# Patient Record
Sex: Female | Born: 1955
Health system: Southern US, Community
[De-identification: ages and names within clinical notes are randomized; demographics above are authoritative.]

## PROBLEM LIST (undated history)

## (undated) DIAGNOSIS — H209 Unspecified iridocyclitis: Secondary | ICD-10-CM

## (undated) DIAGNOSIS — J939 Pneumothorax, unspecified: Secondary | ICD-10-CM

## (undated) DIAGNOSIS — H269 Unspecified cataract: Secondary | ICD-10-CM

## (undated) DIAGNOSIS — D869 Sarcoidosis, unspecified: Secondary | ICD-10-CM

## (undated) DIAGNOSIS — I10 Essential (primary) hypertension: Secondary | ICD-10-CM

## (undated) HISTORY — PX: VESICOVAGINAL FISTULA CLOSURE W/ TAH: SUR271

## (undated) HISTORY — DX: Unspecified cataract: H26.9

## (undated) HISTORY — PX: CATARACT EXTRACTION: SUR2

## (undated) HISTORY — PX: ABDOMINAL HYSTERECTOMY: SHX81

## (undated) HISTORY — DX: Unspecified iridocyclitis: H20.9

## (undated) HISTORY — DX: Sarcoidosis, unspecified: D86.9

## (undated) HISTORY — PX: APPENDECTOMY: SHX54

---

## 1999-03-24 ENCOUNTER — Emergency Department (HOSPITAL_COMMUNITY): Admission: EM | Admit: 1999-03-24 | Discharge: 1999-03-24 | Payer: Self-pay | Admitting: Emergency Medicine

## 1999-03-28 ENCOUNTER — Encounter: Payer: Self-pay | Admitting: Emergency Medicine

## 1999-03-28 ENCOUNTER — Inpatient Hospital Stay (HOSPITAL_COMMUNITY): Admission: EM | Admit: 1999-03-28 | Discharge: 1999-04-01 | Payer: Self-pay | Admitting: Emergency Medicine

## 1999-03-29 ENCOUNTER — Encounter (HOSPITAL_BASED_OUTPATIENT_CLINIC_OR_DEPARTMENT_OTHER): Payer: Self-pay | Admitting: General Surgery

## 1999-04-21 ENCOUNTER — Encounter: Payer: Self-pay | Admitting: Family Medicine

## 1999-04-21 ENCOUNTER — Ambulatory Visit (HOSPITAL_COMMUNITY): Admission: RE | Admit: 1999-04-21 | Discharge: 1999-04-21 | Payer: Self-pay | Admitting: Family Medicine

## 2001-01-24 HISTORY — PX: BRONCHOSCOPY: SUR163

## 2001-04-05 ENCOUNTER — Encounter (INDEPENDENT_AMBULATORY_CARE_PROVIDER_SITE_OTHER): Payer: Self-pay | Admitting: Specialist

## 2001-04-05 ENCOUNTER — Ambulatory Visit: Admission: RE | Admit: 2001-04-05 | Discharge: 2001-04-05 | Payer: Self-pay | Admitting: Pulmonary Disease

## 2001-10-26 ENCOUNTER — Emergency Department (HOSPITAL_COMMUNITY): Admission: EM | Admit: 2001-10-26 | Discharge: 2001-10-26 | Payer: Self-pay | Admitting: Emergency Medicine

## 2003-03-06 ENCOUNTER — Emergency Department (HOSPITAL_COMMUNITY): Admission: EM | Admit: 2003-03-06 | Discharge: 2003-03-07 | Payer: Self-pay | Admitting: Emergency Medicine

## 2006-05-24 ENCOUNTER — Emergency Department (HOSPITAL_COMMUNITY): Admission: EM | Admit: 2006-05-24 | Discharge: 2006-05-25 | Payer: Self-pay | Admitting: Emergency Medicine

## 2007-05-18 DIAGNOSIS — H209 Unspecified iridocyclitis: Secondary | ICD-10-CM | POA: Insufficient documentation

## 2007-05-18 DIAGNOSIS — D869 Sarcoidosis, unspecified: Secondary | ICD-10-CM | POA: Insufficient documentation

## 2007-05-21 ENCOUNTER — Ambulatory Visit: Payer: Self-pay | Admitting: Internal Medicine

## 2007-05-29 ENCOUNTER — Telehealth: Payer: Self-pay | Admitting: Internal Medicine

## 2007-06-19 ENCOUNTER — Ambulatory Visit: Payer: Self-pay | Admitting: Internal Medicine

## 2007-07-01 LAB — CONVERTED CEMR LAB: Angiotensin 1 Converting Enzyme: 108 U/L — ABNORMAL HIGH

## 2007-07-03 ENCOUNTER — Telehealth: Payer: Self-pay | Admitting: Internal Medicine

## 2007-10-19 ENCOUNTER — Ambulatory Visit: Payer: Self-pay | Admitting: Internal Medicine

## 2007-11-09 ENCOUNTER — Telehealth (INDEPENDENT_AMBULATORY_CARE_PROVIDER_SITE_OTHER): Payer: Self-pay | Admitting: *Deleted

## 2008-01-22 ENCOUNTER — Ambulatory Visit: Payer: Self-pay | Admitting: Internal Medicine

## 2008-03-17 ENCOUNTER — Ambulatory Visit: Payer: Self-pay | Admitting: Internal Medicine

## 2009-01-21 ENCOUNTER — Encounter: Admission: RE | Admit: 2009-01-21 | Discharge: 2009-01-21 | Payer: Self-pay | Admitting: Obstetrics and Gynecology

## 2010-01-22 ENCOUNTER — Encounter
Admission: RE | Admit: 2010-01-22 | Discharge: 2010-01-22 | Payer: Self-pay | Source: Home / Self Care | Attending: Obstetrics and Gynecology | Admitting: Obstetrics and Gynecology

## 2010-04-16 ENCOUNTER — Encounter: Payer: Self-pay | Admitting: Internal Medicine

## 2010-04-16 ENCOUNTER — Other Ambulatory Visit (INDEPENDENT_AMBULATORY_CARE_PROVIDER_SITE_OTHER): Payer: BC Managed Care – PPO

## 2010-04-16 ENCOUNTER — Ambulatory Visit (INDEPENDENT_AMBULATORY_CARE_PROVIDER_SITE_OTHER)
Admission: RE | Admit: 2010-04-16 | Discharge: 2010-04-16 | Disposition: A | Discharge status: Home / Self Care | Payer: BC Managed Care – PPO | Source: Ambulatory Visit | Attending: Internal Medicine | Admitting: Internal Medicine

## 2010-04-16 ENCOUNTER — Ambulatory Visit (INDEPENDENT_AMBULATORY_CARE_PROVIDER_SITE_OTHER): Payer: BC Managed Care – PPO | Admitting: Internal Medicine

## 2010-04-16 VITALS — BP 152/84 | HR 64 | Ht 62.0 in | Wt 153.4 lb

## 2010-04-16 DIAGNOSIS — D869 Sarcoidosis, unspecified: Secondary | ICD-10-CM

## 2010-04-16 DIAGNOSIS — H209 Unspecified iridocyclitis: Secondary | ICD-10-CM

## 2010-04-16 LAB — HEPATIC FUNCTION PANEL
AST: 46 U/L — ABNORMAL HIGH (ref 0–37)
Bilirubin, Direct: 0.1 mg/dL (ref 0.0–0.3)
Total Bilirubin: 0.2 mg/dL — ABNORMAL LOW (ref 0.3–1.2)

## 2010-04-16 LAB — BASIC METABOLIC PANEL
BUN: 10 mg/dL (ref 6–23)
CO2: 32 mEq/L (ref 19–32)
Chloride: 101 mEq/L (ref 96–112)
Glucose, Bld: 83 mg/dL (ref 70–99)
Potassium: 4.1 mEq/L (ref 3.5–5.1)

## 2010-04-16 LAB — CBC WITH DIFFERENTIAL/PLATELET
Basophils Relative: 0.5 % (ref 0.0–3.0)
Eosinophils Absolute: 0.1 10*3/uL (ref 0.0–0.7)
Eosinophils Relative: 1.7 % (ref 0.0–5.0)
Hemoglobin: 12.6 g/dL (ref 12.0–15.0)
Lymphocytes Relative: 42.1 % (ref 12.0–46.0)
Monocytes Relative: 10.9 % (ref 3.0–12.0)
Neutro Abs: 2 10*3/uL (ref 1.4–7.7)
Neutrophils Relative %: 44.8 % (ref 43.0–77.0)
RBC: 4.5 Mil/uL (ref 3.87–5.11)
WBC: 4.5 10*3/uL (ref 4.5–10.5)

## 2010-04-16 LAB — PROTIME-INR: Prothrombin Time: 11.9 s (ref 10.2–12.4)

## 2010-04-16 LAB — CALCIUM: Calcium: 9.4 mg/dL (ref 8.4–10.5)

## 2010-04-16 NOTE — Assessment & Plan Note (Addendum)
Symptoms strongly suggest reactivation of Sarcoid. She has coughed up occasional small amounts of blood when she gets up in the morning, for years, not clearly worse.  We will check ACE and liver and renal labs, with CXR., TB skin test and coags.

## 2010-04-16 NOTE — Progress Notes (Signed)
  Subjective:    Patient ID: Jill Alexanders, female    DOB: Oct 10, 1955, 55 y.o.   MRN: [id number]  HPI 75 yoAAF never smoker, here with daughter today. I have seen her for Sarcoid involving eyes/ iritis, lungs and skin. ACE 06/19/07 was 108 (9-67). She was on plaquenyl for spots around her eyes, and on MTX from her opthalmologist - now off both for over a year. She dropped off these meds after last seen by me 03/17/08. Since December she is coughing when active, with a little blood in clear mucus, and some dyspnea especially supine. Denies nasal congestion. Having Night sweats / change bedclothes, w/o definite fever. Cervical nodes may be swollen. Denies pain. Noting some increased skin nodularity in axillae. 20 lb weight  Loss w/o anorexia, change in bowel or bladder. She feels as she did when originally dx'd by bronchoscopy in 2003. She was treated originally with prednisone.    Review of Systems As per HPI Constitutional:   . HEENT:   No headaches,  Difficulty swallowing,  Tooth/dental problems,  Sore throat,                No sneezing, itching, ear ache, nasal congestion, post nasal drip,   CV:  No chest pain,  Orthopnea, PND, swelling in lower extremities, anasarca, dizziness, palpitations  GI  No heartburn, indigestion, abdominal pain, nausea, vomiting, diarrhea, change in bowel habits, loss of appetite  Resp: Some shortness of breath with exertion and  at rest.  No change in color of mucus.  No wheezing.  No chest wall deformity  Skin:   GU: no dysuria, change in color of urine, no urgency or frequency.  No flank pain.  MS:  No joint pain or swelling.  No decreased range of motion.  No back pain.  Psych:  No change in mood or affect. No depression or anxiety.  No memory loss.     Objective:   Physical Exam    General- Alert, Oriented, Affect-appropriate, Distress- none acute. Medium build  Skin- small nodular clusters at corners of eyes, no capillary injection or  icterus  Lymphadenopathy- bilateral posterior cervical nodes, small, rubbery  Head- atraumatic  Eyes- Gross vision intact, PERRLA, conjunctivae clear, secretions  Ears- Normal-Hearing, canals, Tm L ,   R ,  Nose- Clear, Septal dev, mucus, polyps, erosion, perforation   Throat- Mallampati III-IV , mucosa - tongue slightly coated , drainage- none, tonsils- atrophic  Neck- flexible , trachea midline, no stridor , thyroid nl, carotid no bruit  Chest - symmetrical excursion , unlabored     Heart/CV- RRR , no murmur , no gallop  , no rub, nl s1 s2                     - JVD- none , edema- none, stasis changes- none, varices- none     Lung- clear to P&A, wheeze- none, cough- none , dullness-none, rub- none     Chest wall- atraumatic, no scar  Abd- tender-no, distended-no, bowel sounds-present, HSM- no  Br/ Gen/ Rectal- Not done, not indicated  Extrem- cyanosis- none, clubbing, none, atrophy- none, strength- nl  Neuro- grossly intact to observation     Assessment & Plan:

## 2010-04-16 NOTE — Assessment & Plan Note (Signed)
She is denying burning or tearing now.

## 2010-04-16 NOTE — Patient Instructions (Signed)
CXR- PA & Lat for dx Sarcoid  LAB: ACE level. CBC w/ diff, BMET, Liver panel, Pt, PTT, Calcium  PPD skin test

## 2010-04-17 ENCOUNTER — Encounter: Payer: Self-pay | Admitting: Internal Medicine

## 2010-04-19 LAB — TB SKIN TEST
Induration: 0
TB Skin Test: NEGATIVE mm

## 2010-04-21 ENCOUNTER — Telehealth: Payer: Self-pay | Admitting: Internal Medicine

## 2010-04-21 MED ORDER — PREDNISONE 10 MG PO TABS: ORAL_TABLET | ORAL | Status: DC

## 2010-04-21 NOTE — Telephone Encounter (Signed)
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 Pt needs to have prednisone RX called/sent to pharmacy.Lauren Massey

## 2010-04-21 NOTE — Telephone Encounter (Signed)
Pt called again. Says she is returning call from [place]. She asks that if katie calls within the next 20 mins- call pt's cell [phone]. Tivis Ringer

## 2010-04-21 NOTE — Telephone Encounter (Signed)
Katie please advise if you have call pt. Not seeing anything in epic where pt was attempted to be contacted. Thanks  Carver Fila, CMA

## 2010-04-21 NOTE — Telephone Encounter (Signed)
Pt is aware of results and requests Pred RX be sent to [facility] [address]. Pt to call the office if any questions or concerns.

## 2010-06-04 ENCOUNTER — Ambulatory Visit: Payer: BC Managed Care – PPO | Admitting: Internal Medicine

## 2010-06-11 NOTE — Procedures (Signed)
[facility]  Patient:    Lauren Massey, Lauren Massey Visit Number: [id number] MRN: [id number]          Service Type: DSU Location: CARD Attending Physician:  Merwyn Katos Dictated by:   Oley Balm Sung Amabile, M.D. [facility] Admit Date:  04/05/2001 Discharge Date: 04/05/2001                             Procedure Report  DATE OF BIRTH:  12-Apr-1955  PREOPERATIVE DIAGNOSIS:  Persistent hemoptysis.  POSTOPERATIVE DIAGNOSIS:  Persistent hemoptysis.  OPERATION/PROCEDURE:  Bronchoscopy.  SURGEON:  Oley Balm. Simonds, M.D. [facility]  INDICATIONS:  Bulky mediastinal adenopathy and bilateral infiltrates consistent with sarcoidosis.  PREMEDICATION:  Fentanyl 50 mcg, Versed 4 mg IV.  ANESTHESIA:  Forty cc of 1% Lidocaine were used during the course of the procedure.  Topical anesthesia was applied to the nose and throat.  DESCRIPTION OF PROCEDURE:  After adequate sedation and anesthesia, the bronchoscope was introduced via the right nares and advanced into the posterior pharynx, demonstrating normal upper airway anatomy with symmetric vocal cord movement.   Further anesthesia was achieved with 1% lidocaine and the bronchoscope was advanced up the trachea.  Complete airway anesthesia was achieved with 1% lidocaine and an airway examination was performed.  This demonstrated normal segmental airway anatomy with no endobronchial tumors, masses, or foreign bodies.  There were no mucosal abnormalities and minimal secretions noted.  After completion of the airway examination a bronchoalveolar lavage was performed in the lingula, and this specimen was sent for cytology as well as a white blood cell count and differential.  After completion of this, the bronchoscope was positioned in the left lower lobe, lateral basilar segment and transbronchial biopsies were obtained under fluoroscopic guidance.  Six or seven biopsies were performed.  These specimens were sent for  surgical pathology.  The patient tolerated the procedure well without complications. Dictated by:   Oley Balm Sung Amabile, M.D. [facility] Attending Physician:  Merwyn Katos DD:  04/06/01 TD:  04/09/01 Job: [id number]

## 2010-07-13 ENCOUNTER — Ambulatory Visit: Payer: BC Managed Care – PPO | Admitting: Internal Medicine

## 2010-09-02 ENCOUNTER — Other Ambulatory Visit (INDEPENDENT_AMBULATORY_CARE_PROVIDER_SITE_OTHER): Payer: BC Managed Care – PPO

## 2010-09-02 ENCOUNTER — Ambulatory Visit (INDEPENDENT_AMBULATORY_CARE_PROVIDER_SITE_OTHER): Payer: BC Managed Care – PPO | Admitting: Internal Medicine

## 2010-09-02 ENCOUNTER — Encounter: Payer: Self-pay | Admitting: Internal Medicine

## 2010-09-02 VITALS — BP 136/84 | HR 73 | Ht 62.0 in | Wt 158.8 lb

## 2010-09-02 DIAGNOSIS — D869 Sarcoidosis, unspecified: Secondary | ICD-10-CM

## 2010-09-02 DIAGNOSIS — R002 Palpitations: Secondary | ICD-10-CM

## 2010-09-02 LAB — BASIC METABOLIC PANEL
BUN: 15 mg/dL (ref 6–23)
CO2: 29 mEq/L (ref 19–32)
Calcium: 9.4 mg/dL (ref 8.4–10.5)
Glucose, Bld: 81 mg/dL (ref 70–99)
Sodium: 136 mEq/L (ref 135–145)

## 2010-09-02 NOTE — Patient Instructions (Signed)
Order- EKG for dx palpitation            Schedule PFT             Lab- BMET, magnesium, TSH-  For dx palpitation                    ACE level- dx sarcoid

## 2010-09-02 NOTE — Assessment & Plan Note (Signed)
  This might relate to the sarcoid, but we will check TFT and get EKG.

## 2010-09-02 NOTE — Assessment & Plan Note (Signed)
We will get PFT before deciding whether to treat more aggressively. Will repeat ACE now after the initial prednisone.

## 2010-09-02 NOTE — Progress Notes (Signed)
Subjective:    Patient ID: Lauren Massey, female    DOB: 05-19-1955, 55 y.o.   MRN: [id number]  HPI    Review of Systems     Objective:   Physical Exam        Assessment & Plan:   Subjective:    Patient ID: Lauren Massey, female    DOB: 18-Mar-1955, 55 y.o.   MRN: [id number]  HPI 68 yoAAF never smoker, here with daughter today. I have seen her for Sarcoid involving eyes/ iritis, lungs and skin. ACE 06/19/07 was 108 (9-67). She was on plaquenyl for spots around her eyes, and on MTX from her opthalmologist - now off both for over a year. She dropped off these meds after last seen by me 03/17/08. Since December she is coughing when active, with a little blood in clear mucus, and some dyspnea especially supine. Denies nasal congestion. Having Night sweats / change bedclothes, w/o definite fever. Cervical nodes may be swollen. Denies pain. Noting some increased skin nodularity in axillae. 20 lb weight  Loss w/o anorexia, change in bowel or bladder. She feels as she did when originally dx'd by bronchoscopy in 2003. She was treated originally with prednisone.  09/02/10-  54 yoAAF never smoker, here with daughter today. I have seen her for Sarcoid involving eyes/ iritis, lungs and skin. ACE 06/19/07 was 108 (9-67). For the past 6 weeks has noted palpitations with lightheadedness, lasting 5 minutes. May get diaphoretic. She stopped caffeine in response, without change. No more weight loss. Still cough with scant streak of blood in the mornings. Occasional night sweats. No dyspnea or chest pain. Because of abnormal labs, we had her take prednisone starting 3/25-  20 mg x 1 week, then 10 mg daily for 3 more weeks. She felt no different and has been out x 2 weeks.  EKG today- WNL ACE 04/16/10> 100 Hepatic- not done BMET-04/16/10- WNL CXR- 04/16/10- progressive fibrosis, hilar nodes PPD-3/32/12 negative  Review of Systems As per HPI Constitutional:   No-   weight loss, night sweats, fevers,  chills, fatigue, lassitude. HEENT:   No-  headaches, difficulty swallowing, tooth/dental problems, sore throat,       No-  sneezing, itching, ear ache, nasal congestion, post nasal drip,  CV:  No-   chest pain, orthopnea, PND, swelling in lower extremities, anasarca,                                 + dizziness, palpitations Resp: No-   shortness of breath with exertion or at rest.              No-   productive cough,  No- worse non-productive cough,  No-  coughing up of blood.              No-   change in color of mucus.  No- wheezing.   Skin: No-   rash or lesions. GI:  No-   heartburn, indigestion, abdominal pain, nausea, vomiting, diarrhea,                 change in bowel habits, loss of appetite GU: No-   dysuria, change in color of urine, no urgency or frequency.  No- flank pain. MS:  No-   joint pain or swelling.  No- decreased range of motion.  No- back pain. Neuro- grossly normal to observation, Or:  Psych:  No- change in mood or affect. No  depression or anxiety.  No memory loss.      Objective:   Physical Exam    General- Alert, Oriented, Affect-appropriate, Distress- none acute Skin- rash-none, lesions- none, excoriation- none Lymphadenopathy- none Head- atraumatic            Eyes- Gross vision intact, PERRLA, conjunctivae clear secretions            Ears- Hearing, canals normal            Nose- Clear, No-Septal dev, mucus, polyps, erosion, perforation             Throat- Mallampati III-IV , mucosa clear , drainage- none, tonsils- atrophic Neck- flexible , trachea midline, no stridor , thyroid nl, carotid no bruit Chest - symmetrical excursion , unlabored           Heart/CV- RRR , no murmur , no gallop  , no rub, nl s1 s2                           - JVD- none , edema- none, stasis changes- none, varices- none           Lung- +crackles in lower zones         , wheeze- none, cough- none , dullness-none, rub- none     EKG 09/02/10- wnl           Chest wall-  Abd- tender-no,  distended-no, bowel sounds-present, HSM- no Br/ Gen/ Rectal- Not done, not indicated Extrem- cyanosis- none, clubbing, none, atrophy- none, strength- nl Neuro- grossly intact to observation      Assessment & Plan:

## 2010-09-03 LAB — ANGIOTENSIN CONVERTING ENZYME: Angiotensin-Converting Enzyme: >100 U/L — ABNORMAL HIGH (ref 8–52)

## 2010-09-05 ENCOUNTER — Encounter: Payer: Self-pay | Admitting: Internal Medicine

## 2010-09-22 NOTE — Progress Notes (Signed)
Quick Note:  Left message to call back. ______ 

## 2010-09-23 ENCOUNTER — Telehealth: Payer: Self-pay | Admitting: Internal Medicine

## 2010-09-23 NOTE — Telephone Encounter (Signed)
Notes Recorded by Waymon Budge, MD on 09/03/2010 at 8:57 AM Labs are normal except ACE level which is still high, indicating sarcoid is active. Plan to continue prednisone.      Spoke with pt and notified of results/recs per CDY. She verbalized understanding. States that she needs refill on prednisone. Please advise how this is to be taken and I will send to her pharmacy that's on file for her. Thanks!

## 2010-09-24 MED ORDER — PREDNISONE 10 MG PO TABS: ORAL_TABLET | ORAL | Status: DC

## 2010-09-24 NOTE — Telephone Encounter (Signed)
Please send prednisone 10 mg, # 75          4 X 2 DAYS, 3 X 2 DAYS, 2 X 2 DAYS, then one daily

## 2010-09-24 NOTE — Telephone Encounter (Signed)
Spoke with pt and notified that rx for pred has been sent to pharm. Pt verbalized understanding.

## 2010-09-24 NOTE — Telephone Encounter (Signed)
Rx sent - lmomtcb

## 2010-10-13 ENCOUNTER — Telehealth: Payer: Self-pay | Admitting: Internal Medicine

## 2010-10-13 NOTE — Telephone Encounter (Signed)
LMOMTCB x 1 

## 2010-10-14 ENCOUNTER — Ambulatory Visit: Payer: BC Managed Care – PPO | Admitting: Internal Medicine

## 2010-10-14 NOTE — Telephone Encounter (Signed)
lmtcb

## 2010-10-15 NOTE — Telephone Encounter (Signed)
PT RETURNED CALL FROM TRIAGE. Kathleen W Perdue  °

## 2010-10-15 NOTE — Telephone Encounter (Signed)
Pt returned call & can be reached [phone].  Lauren Massey

## 2010-10-15 NOTE — Telephone Encounter (Signed)
LMOMTCB x 1 

## 2010-10-15 NOTE — Telephone Encounter (Signed)
Pt was given prednisone taper at last OV with CDY and the instructions were to take one daily after she got to the 10 mg a day. Pt is aware to continue on this dose until she comes back for follow-up.

## 2010-10-15 NOTE — Telephone Encounter (Signed)
LMTCBx3. Jordy Hewins, CMA  

## 2010-11-11 ENCOUNTER — Ambulatory Visit: Payer: BC Managed Care – PPO | Admitting: Internal Medicine

## 2011-01-06 ENCOUNTER — Other Ambulatory Visit: Payer: Self-pay | Admitting: Obstetrics and Gynecology

## 2011-01-06 DIAGNOSIS — Z1231 Encounter for screening mammogram for malignant neoplasm of breast: Secondary | ICD-10-CM

## 2011-01-27 ENCOUNTER — Ambulatory Visit: Payer: BC Managed Care – PPO

## 2011-02-18 ENCOUNTER — Ambulatory Visit: Payer: BC Managed Care – PPO

## 2011-02-22 ENCOUNTER — Ambulatory Visit
Admission: RE | Admit: 2011-02-22 | Discharge: 2011-02-22 | Disposition: A | Discharge status: Home / Self Care | Payer: BC Managed Care – PPO | Source: Ambulatory Visit | Attending: Obstetrics and Gynecology | Admitting: Obstetrics and Gynecology

## 2011-02-22 DIAGNOSIS — Z1231 Encounter for screening mammogram for malignant neoplasm of breast: Secondary | ICD-10-CM

## 2011-12-01 ENCOUNTER — Ambulatory Visit (INDEPENDENT_AMBULATORY_CARE_PROVIDER_SITE_OTHER): Payer: BC Managed Care – PPO | Admitting: Internal Medicine

## 2011-12-01 ENCOUNTER — Ambulatory Visit (INDEPENDENT_AMBULATORY_CARE_PROVIDER_SITE_OTHER)
Admission: RE | Admit: 2011-12-01 | Discharge: 2011-12-01 | Disposition: A | Discharge status: Home / Self Care | Payer: BC Managed Care – PPO | Source: Ambulatory Visit | Attending: Internal Medicine | Admitting: Internal Medicine

## 2011-12-01 ENCOUNTER — Encounter: Payer: Self-pay | Admitting: Internal Medicine

## 2011-12-01 ENCOUNTER — Other Ambulatory Visit (INDEPENDENT_AMBULATORY_CARE_PROVIDER_SITE_OTHER): Payer: BC Managed Care – PPO

## 2011-12-01 VITALS — BP 120/76 | HR 74 | Ht 62.0 in | Wt 168.6 lb

## 2011-12-01 DIAGNOSIS — D869 Sarcoidosis, unspecified: Secondary | ICD-10-CM

## 2011-12-01 DIAGNOSIS — Z23 Encounter for immunization: Secondary | ICD-10-CM

## 2011-12-01 LAB — COMPREHENSIVE METABOLIC PANEL
Albumin: 3.2 g/dL — ABNORMAL LOW (ref 3.5–5.2)
Alkaline Phosphatase: 87 U/L (ref 39–117)
BUN: 12 mg/dL (ref 6–23)
Creatinine, Ser: 0.7 mg/dL (ref 0.4–1.2)
Glucose, Bld: 84 mg/dL (ref 70–99)
Total Bilirubin: 0.2 mg/dL — ABNORMAL LOW (ref 0.3–1.2)

## 2011-12-01 LAB — CBC WITH DIFFERENTIAL/PLATELET
Basophils Relative: 0.9 % (ref 0.0–3.0)
Eosinophils Absolute: 0.1 10*3/uL (ref 0.0–0.7)
Eosinophils Relative: 2.2 % (ref 0.0–5.0)
HCT: 38.2 % (ref 36.0–46.0)
Lymphs Abs: 1.7 10*3/uL (ref 0.7–4.0)
MCHC: 32 g/dL (ref 30.0–36.0)
MCV: 83.9 fl (ref 78.0–100.0)
Monocytes Absolute: 0.6 10*3/uL (ref 0.1–1.0)
Neutro Abs: 2.3 10*3/uL (ref 1.4–7.7)
RBC: 4.55 Mil/uL (ref 3.87–5.11)
WBC: 4.7 10*3/uL (ref 4.5–10.5)

## 2011-12-01 NOTE — Progress Notes (Signed)
Patient ID: Lauren Massey, female    DOB: Aug 03, 1955, 55 y.o.   MRN: [id number]  HPI 14 yoAAF never smoker, here with daughter today. I have seen her for Sarcoid involving eyes/ iritis, lungs and skin. ACE 06/19/07 was 108 (9-67). She was on plaquenyl for spots around her eyes, and on MTX from her opthalmologist - now off both for over a year. She dropped off these meds after last seen by me 03/17/08. Since December she is coughing when active, with a little blood in clear mucus, and some dyspnea especially supine. Denies nasal congestion. Having Night sweats / change bedclothes, w/o definite fever. Cervical nodes may be swollen. Denies pain. Noting some increased skin nodularity in axillae. 20 lb weight  Loss w/o anorexia, change in bowel or bladder. She feels as she did when originally dx'd by bronchoscopy in 2003. She was treated originally with prednisone.  09/02/10-  54 yoAAF never smoker, here with daughter today. I have seen her for Sarcoid involving eyes/ iritis, lungs and skin. ACE 06/19/07 was 108 (9-67). For the past 6 weeks has noted palpitations with lightheadedness, lasting 5 minutes. May get diaphoretic. She stopped caffeine in response, without change. No more weight loss. Still cough with scant streak of blood in the mornings. Occasional night sweats. No dyspnea or chest pain. Because of abnormal labs, we had her take prednisone starting 3/25-  20 mg x 1 week, then 10 mg daily for 3 more weeks. She felt no different and has been out x 2 weeks.  EKG today- WNL ACE 04/16/10> 100 Hepatic- not done BMET-04/16/10- WNL CXR- 04/16/10- progressive fibrosis, hilar nodes PPD-3/32/12 negative  12/01/11- 56 yoAAF never smoker. I have seen her for Sarcoid involving eyes/ iritis, lungs and skin/ bronchoscopy 2000.  ACE 06/19/07 was 108 (9-67). Dx'd Would like to discuss PNA shot today. Would like Flu shot as well. Denies night sweats. Increased dry cough x months no swollen glands. Complains of rash  around her neck. Eyes burn. Low dose prednisone had little effect in 2012. No TB exposure. Works at [employer]. CXR 04/26/11- reviewed with her IMPRESSION:  1. Progression of parenchymal changes. Differential diagnosis  includes sarcoidosis and superimposed infectious process.  2. Suspect mediastinal adenopathy, also consistent with  sarcoidosis.  Original Report Authenticated By: Patterson Hammersmith, M.D.   Review of Systems- see HPI Constitutional:   No-   weight loss, night sweats, fevers, chills, fatigue, lassitude. HEENT:   No-  headaches, difficulty swallowing, tooth/dental problems, sore throat,       No-  sneezing, itching, ear ache, nasal congestion, post nasal drip,  CV:  No-   chest pain, orthopnea, PND, swelling in lower extremities, anasarca,  + dizziness, palpitations Resp: No-   shortness of breath with exertion or at rest.              No-   productive cough,  + non-productive cough,  No-  coughing up of blood.              No-   change in color of mucus.  No- wheezing.   Skin: +  rash or lesions. GI:  No-   heartburn, indigestion, abdominal pain, nausea, vomiting,  GU:  MS:  No-   joint pain or swelling.  No- decreased range of motion.  No- back pain. Neuro- nothing unusual Psych:  No- change in mood or affect. No depression or anxiety.  No memory loss.  Objective:   Physical Exam BP 120/76  Pulse 74  Ht 5\' 2"  (1.575 m)  Wt 168 lb 9.6 oz (76.476 kg)  BMI 30.84 kg/m2  SpO2 99% General- Alert, Oriented, Affect-appropriate, Distress- none acute Skin- + multiple small linear plaques along flexure lines left side of neck Lymphadenopathy- none Head- atraumatic            Eyes- Gross vision intact, PERRLA, conjunctivae clear secretions            Ears- Hearing, canals normal            Nose- Clear, No-Septal dev, mucus, polyps, erosion, perforation             Throat- Mallampati IV , mucosa clear , drainage- none, tonsils- atrophic Neck- flexible , trachea  midline, no stridor , thyroid nl, carotid no bruit Chest - symmetrical excursion , unlabored           Heart/CV- RRR , no murmur , no gallop  , no rub, nl s1 s2                           - JVD- none , edema- none, stasis changes- none, varices- none           Lung- +crackles in lower zones, wheeze- none, cough- none , dullness-none, rub- none                Chest wall-  Abd- tender-no, distended-no, bowel sounds-present, HSM- no Br/ Gen/ Rectal- Not done, not indicated Extrem- cyanosis- none, clubbing, none, atrophy- none, strength- nl Neuro- grossly intact to observation Assessment & Plan:

## 2011-12-01 NOTE — Patient Instructions (Addendum)
Flu vax  Pneumovax  Order- CXR- dx sarcoid  Order Lab- ACE level, CBC, CMET,     Dx sarcoid

## 2011-12-08 ENCOUNTER — Telehealth: Payer: Self-pay | Admitting: Internal Medicine

## 2011-12-08 NOTE — Progress Notes (Signed)
Quick Note:  LMTCB ______ 

## 2011-12-08 NOTE — Telephone Encounter (Signed)
Notes Recorded by Waymon Budge, MD on 12/05/2011 at 4:40 PM Chemistry and blood counts are ok.  ACE level remains over 100, which does indicate active sarcoid even though CXR looks stable. Since ACE has been at this level before, i think we can wait and discuss what to do when she keeps her December appointment Notes Recorded by Waymon Budge, MD on 12/01/2011 at 4:35 PM CXR- shows changes of chronic sarcoid, but not worse than on last xray. We are waiting on blood work results  I spoke with patient about results and she verbalized understanding and had no questions

## 2011-12-11 NOTE — Assessment & Plan Note (Signed)
ACE level has stayed high, not clear if this implies active disease Plan flu and pneumococcal vaccines. ACE level, CBC, chemistry. Return to discuss treatment

## 2012-01-05 ENCOUNTER — Ambulatory Visit: Payer: BC Managed Care – PPO | Admitting: Internal Medicine

## 2012-01-06 ENCOUNTER — Encounter (HOSPITAL_COMMUNITY): Payer: Self-pay | Admitting: *Deleted

## 2012-01-06 ENCOUNTER — Emergency Department (HOSPITAL_COMMUNITY)
Admission: EM | Admit: 2012-01-06 | Discharge: 2012-01-06 | Discharge status: Against Medical Advice | Payer: BC Managed Care – PPO | Attending: Emergency Medicine | Admitting: Emergency Medicine

## 2012-01-06 DIAGNOSIS — M549 Dorsalgia, unspecified: Secondary | ICD-10-CM | POA: Insufficient documentation

## 2012-01-06 LAB — CBC WITH DIFFERENTIAL/PLATELET
Eosinophils Absolute: 0.1 10*3/uL (ref 0.0–0.7)
Eosinophils Relative: 1 % (ref 0–5)
Hemoglobin: 11.9 g/dL — ABNORMAL LOW (ref 12.0–15.0)
Lymphs Abs: 3 10*3/uL (ref 0.7–4.0)
MCH: 27.5 pg (ref 26.0–34.0)
MCV: 84.1 fL (ref 78.0–100.0)
Monocytes Relative: 7 % (ref 3–12)
RBC: 4.33 MIL/uL (ref 3.87–5.11)

## 2012-01-06 LAB — COMPREHENSIVE METABOLIC PANEL
Alkaline Phosphatase: 113 U/L (ref 39–117)
BUN: 9 mg/dL (ref 6–23)
Calcium: 9.5 mg/dL (ref 8.4–10.5)
GFR calc Af Amer: >90 mL/min (ref >90)
Glucose, Bld: 115 mg/dL — ABNORMAL HIGH (ref 70–99)
Total Protein: 10.2 g/dL — ABNORMAL HIGH (ref 6.0–8.3)

## 2012-01-06 MED ORDER — ACETAMINOPHEN 325 MG PO TABS
650.0000 mg | ORAL_TABLET | Freq: Once | ORAL | Status: AC
  Administered 2012-01-06: 650 mg via ORAL

## 2012-01-06 NOTE — ED Notes (Addendum)
Labs & VS reviewed at time pt choosing to leave, pt left prior to re-VS.

## 2012-01-06 NOTE — ED Notes (Signed)
Pt choosing to leave prior to triage, will contact her MD.

## 2012-01-06 NOTE — ED Notes (Signed)
Pt unable to void 

## 2012-01-06 NOTE — ED Notes (Signed)
Changed her mind about leaving when called to be triaged.

## 2012-01-06 NOTE — ED Notes (Signed)
The pt has had in her back with chills and fever for 3-4 days and she just does not feel well.  She has sarcodosis

## 2012-01-06 NOTE — ED Notes (Signed)
The pt has not taken any tylenol or advil at all

## 2012-01-06 NOTE — ED Notes (Signed)
Patient called for triage with no answer at this time.

## 2012-01-19 ENCOUNTER — Other Ambulatory Visit: Payer: Self-pay | Admitting: Obstetrics and Gynecology

## 2012-01-19 ENCOUNTER — Ambulatory Visit: Payer: BC Managed Care – PPO | Admitting: Internal Medicine

## 2012-01-19 DIAGNOSIS — Z1231 Encounter for screening mammogram for malignant neoplasm of breast: Secondary | ICD-10-CM

## 2012-01-24 ENCOUNTER — Encounter (HOSPITAL_COMMUNITY): Payer: Self-pay | Admitting: *Deleted

## 2012-01-24 ENCOUNTER — Emergency Department (HOSPITAL_COMMUNITY)
Admission: EM | Admit: 2012-01-24 | Discharge: 2012-01-24 | Disposition: A | Discharge status: Home / Self Care | Payer: BC Managed Care – PPO | Attending: Emergency Medicine | Admitting: Emergency Medicine

## 2012-01-24 DIAGNOSIS — Z872 Personal history of diseases of the skin and subcutaneous tissue: Secondary | ICD-10-CM | POA: Insufficient documentation

## 2012-01-24 DIAGNOSIS — Z79899 Other long term (current) drug therapy: Secondary | ICD-10-CM | POA: Insufficient documentation

## 2012-01-24 DIAGNOSIS — R197 Diarrhea, unspecified: Secondary | ICD-10-CM | POA: Insufficient documentation

## 2012-01-24 DIAGNOSIS — R112 Nausea with vomiting, unspecified: Secondary | ICD-10-CM

## 2012-01-24 LAB — BASIC METABOLIC PANEL
BUN: 20 mg/dL (ref 6–23)
Chloride: 99 mEq/L (ref 96–112)
GFR calc Af Amer: >90 mL/min (ref >90)
Potassium: 4 mEq/L (ref 3.5–5.1)

## 2012-01-24 LAB — CBC
HCT: 40.4 % (ref 36.0–46.0)
Hemoglobin: 12.7 g/dL (ref 12.0–15.0)
RDW: 15.2 % (ref 11.5–15.5)
WBC: 8.9 10*3/uL (ref 4.0–10.5)

## 2012-01-24 MED ORDER — SODIUM CHLORIDE 0.9 % IV SOLN
1000.0000 mL | INTRAVENOUS | Status: DC
  Administered 2012-01-24: 1000 mL via INTRAVENOUS

## 2012-01-24 MED ORDER — ONDANSETRON HCL 4 MG/2ML IJ SOLN
4.0000 mg | Freq: Once | INTRAMUSCULAR | Status: AC
  Administered 2012-01-24: 4 mg via INTRAVENOUS
  Filled 2012-01-24: qty 2

## 2012-01-24 MED ORDER — SODIUM CHLORIDE 0.9 % IV SOLN
1000.0000 mL | Freq: Once | INTRAVENOUS | Status: AC
  Administered 2012-01-24: 1000 mL via INTRAVENOUS

## 2012-01-24 MED ORDER — ONDANSETRON 8 MG PO TBDP: 8.0000 mg | ORAL_TABLET | Freq: Three times a day (TID) | ORAL | Status: DC | PRN

## 2012-01-24 NOTE — ED Notes (Signed)
Reports weakness for 3 days with nausea, therefore came to the ED. While waiting in the waiting room voit med x2 in the bathroom. Denies chills, fever, diarrhea, SOB and pain. Lucila Maine has been sick with the cold( on Thurs & vomited all day Fri.)

## 2012-01-24 NOTE — ED Notes (Signed)
Pt reports weakness x3 days. Vomited x2 today. Denies pain.

## 2012-01-24 NOTE — ED Notes (Signed)
Bedside report received from previous RN 

## 2012-01-24 NOTE — ED Provider Notes (Signed)
History     CSN: [id number]  Arrival date & time 01/24/12  1529   First MD Initiated Contact with Patient 01/24/12 1725      Chief Complaint  Patient presents with  . Weakness  . Emesis     The history is provided by the patient.   the patient reports nausea vomiting and diarrhea over the past 12 hours.  She's had generalized weakness.  She denies urinary symptoms.  No episodes of syncope.  No chest pain shortness of breath.  Chills without documented fever.  No cough or shortness of breath.  She denies melena or hematochezia.  No hematemesis.  No recent sick contacts.  She denies abdominal discomfort at this time.  Her symptoms are mild to moderate in severity.  Nothing improves or worsens her symptoms.  Past Medical History  Diagnosis Date  . Unspecified iridocyclitis   . Sarcoidosis     skin, eye, lung    Past Surgical History  Procedure Date  . Bronchoscopy 2003  . Vesicovaginal fistula closure w/ tah   . Appendectomy     Family History  Problem Relation Age of Onset  . Diabetes    . Cancer Father   . Heart disease Father     History  Substance Use Topics  . Smoking status: Never Smoker   . Smokeless tobacco: Not on file  . Alcohol Use: No    OB History    Grav Para Term Preterm Abortions TAB SAB Ect Mult Living                  Review of Systems  Gastrointestinal: Positive for vomiting.  Neurological: Positive for weakness.  All other systems reviewed and are negative.    Allergies  Review of patient's allergies indicates no known allergies.  Home Medications   Current Outpatient Rx  Name  Route  Sig  Dispense  Refill  . ONDANSETRON 8 MG PO TBDP   Oral   Take 1 tablet (8 mg total) by mouth every 8 (eight) hours as needed for nausea.   12 tablet   0   . PREDNISONE 20 MG PO TABS   Oral   Take 120 mg by mouth daily.           BP 162/82  Pulse 47  Temp 98.3 F (36.8 C) (Oral)  Resp 16  SpO2 99%  Physical Exam  Nursing note and  vitals reviewed. Constitutional: She is oriented to person, place, and time. She appears well-developed and well-nourished. No distress.  HENT:  Head: Normocephalic and atraumatic.  Eyes: EOM are normal.  Neck: Normal range of motion.  Cardiovascular: Normal rate, regular rhythm and normal heart sounds.   Pulmonary/Chest: Effort normal and breath sounds normal.  Abdominal: Soft. She exhibits no distension. There is no tenderness. There is no rebound and no guarding.  Musculoskeletal: Normal range of motion.  Neurological: She is alert and oriented to person, place, and time.  Skin: Skin is warm and dry.  Psychiatric: She has a normal mood and affect. Judgment normal.    ED Course  Procedures (including critical care time)  Labs Reviewed  BASIC METABOLIC PANEL - Abnormal; Notable for the following:    Glucose, Bld 112 (*)     All other components within normal limits  CBC   No results found.   1. Nausea vomiting and diarrhea       MDM  8:52 PM Patient feels much better at this time after antinausea  medicine.  Labs are without significant abnormality.  She was hydrated in the emergency department.  She's tolerating oral fluids in the emergency apartment.  Discharge home in good condition.  She understands to return to the ER for new or worsening symptoms        Lyanne Co, MD 01/24/12 2053

## 2012-02-02 ENCOUNTER — Encounter (HOSPITAL_COMMUNITY): Payer: Self-pay | Admitting: Emergency Medicine

## 2012-02-02 ENCOUNTER — Emergency Department (HOSPITAL_COMMUNITY)
Admission: EM | Admit: 2012-02-02 | Discharge: 2012-02-03 | Disposition: A | Discharge status: Home / Self Care | Payer: BC Managed Care – PPO | Attending: Emergency Medicine | Admitting: Emergency Medicine

## 2012-02-02 ENCOUNTER — Emergency Department (HOSPITAL_COMMUNITY): Payer: BC Managed Care – PPO

## 2012-02-02 DIAGNOSIS — M549 Dorsalgia, unspecified: Secondary | ICD-10-CM

## 2012-02-02 DIAGNOSIS — Z862 Personal history of diseases of the blood and blood-forming organs and certain disorders involving the immune mechanism: Secondary | ICD-10-CM

## 2012-02-02 DIAGNOSIS — R0600 Dyspnea, unspecified: Secondary | ICD-10-CM

## 2012-02-02 DIAGNOSIS — Z79899 Other long term (current) drug therapy: Secondary | ICD-10-CM | POA: Insufficient documentation

## 2012-02-02 DIAGNOSIS — Z8709 Personal history of other diseases of the respiratory system: Secondary | ICD-10-CM | POA: Insufficient documentation

## 2012-02-02 DIAGNOSIS — J45909 Unspecified asthma, uncomplicated: Secondary | ICD-10-CM | POA: Insufficient documentation

## 2012-02-02 DIAGNOSIS — R0602 Shortness of breath: Secondary | ICD-10-CM | POA: Insufficient documentation

## 2012-02-02 NOTE — ED Notes (Signed)
Pt to chest xray at this time

## 2012-02-02 NOTE — ED Notes (Signed)
Pt states she was feeling fine this morning and she went to work  Pt states while at work her mid back started to hurt  Pt states the pain is causing her to be short of breath  Pt states she has a cough but not bad  States it hurts to breathe

## 2012-02-03 ENCOUNTER — Emergency Department (HOSPITAL_COMMUNITY): Payer: BC Managed Care – PPO

## 2012-02-03 LAB — CBC WITH DIFFERENTIAL/PLATELET
Basophils Absolute: 0 10*3/uL (ref 0.0–0.1)
Basophils Relative: 0 % (ref 0–1)
Eosinophils Absolute: 0.1 10*3/uL (ref 0.0–0.7)
Eosinophils Relative: 1 % (ref 0–5)
HCT: 34.3 % — ABNORMAL LOW (ref 36.0–46.0)
Hemoglobin: 11 g/dL — ABNORMAL LOW (ref 12.0–15.0)
Lymphocytes Relative: 26 % (ref 12–46)
Lymphs Abs: 1.3 10*3/uL (ref 0.7–4.0)
MCH: 27.2 pg (ref 26.0–34.0)
MCHC: 32.1 g/dL (ref 30.0–36.0)
MCV: 84.7 fL (ref 78.0–100.0)
Monocytes Absolute: 0.4 10*3/uL (ref 0.1–1.0)
Monocytes Relative: 8 % (ref 3–12)
Neutro Abs: 3.2 10*3/uL (ref 1.7–7.7)
Neutrophils Relative %: 64 % (ref 43–77)
Platelets: 181 10*3/uL (ref 150–400)
RBC: 4.05 MIL/uL (ref 3.87–5.11)
RDW: 15.3 % (ref 11.5–15.5)
WBC: 5 10*3/uL (ref 4.0–10.5)

## 2012-02-03 LAB — COMPREHENSIVE METABOLIC PANEL
ALT: 19 U/L (ref 0–35)
AST: 22 U/L (ref 0–37)
Albumin: 2.5 g/dL — ABNORMAL LOW (ref 3.5–5.2)
Alkaline Phosphatase: 85 U/L (ref 39–117)
BUN: 12 mg/dL (ref 6–23)
CO2: 26 mEq/L (ref 19–32)
Calcium: 9 mg/dL (ref 8.4–10.5)
Chloride: 97 mEq/L (ref 96–112)
Creatinine, Ser: 0.5 mg/dL (ref 0.50–1.10)
GFR calc Af Amer: >90 mL/min (ref >90)
GFR calc non Af Amer: >90 mL/min (ref >90)
Glucose, Bld: 107 mg/dL — ABNORMAL HIGH (ref 70–99)
Potassium: 3.7 mEq/L (ref 3.5–5.1)
Sodium: 131 mEq/L — ABNORMAL LOW (ref 135–145)
Total Bilirubin: 0.4 mg/dL (ref 0.3–1.2)
Total Protein: 7.8 g/dL (ref 6.0–8.3)

## 2012-02-03 LAB — LIPASE, BLOOD: Lipase: 18 U/L (ref 11–59)

## 2012-02-03 MED ORDER — IOHEXOL 350 MG/ML SOLN
100.0000 mL | Freq: Once | INTRAVENOUS | Status: AC | PRN
  Administered 2012-02-03: 100 mL via INTRAVENOUS

## 2012-02-03 MED ORDER — IBUPROFEN 600 MG PO TABS: 600.0000 mg | ORAL_TABLET | Freq: Three times a day (TID) | ORAL | Status: DC

## 2012-02-03 MED ORDER — HYDROCODONE-ACETAMINOPHEN 5-325 MG PO TABS: 1.0000 | ORAL_TABLET | ORAL | Status: DC | PRN

## 2012-02-03 MED ORDER — IPRATROPIUM BROMIDE 0.02 % IN SOLN
0.5000 mg | Freq: Once | RESPIRATORY_TRACT | Status: AC
  Administered 2012-02-03: 0.5 mg via RESPIRATORY_TRACT
  Filled 2012-02-03: qty 2.5

## 2012-02-03 MED ORDER — METHYLPREDNISOLONE SODIUM SUCC 125 MG IJ SOLR
125.0000 mg | Freq: Once | INTRAMUSCULAR | Status: AC
  Administered 2012-02-03: 125 mg via INTRAVENOUS
  Filled 2012-02-03: qty 2

## 2012-02-03 MED ORDER — ALBUTEROL SULFATE (5 MG/ML) 0.5% IN NEBU
5.0000 mg | INHALATION_SOLUTION | Freq: Once | RESPIRATORY_TRACT | Status: AC
  Administered 2012-02-03: 5 mg via RESPIRATORY_TRACT
  Filled 2012-02-03: qty 1

## 2012-02-03 MED ORDER — MORPHINE SULFATE 4 MG/ML IJ SOLN
4.0000 mg | Freq: Once | INTRAMUSCULAR | Status: AC
  Administered 2012-02-03: 4 mg via INTRAVENOUS
  Filled 2012-02-03: qty 1

## 2012-02-03 MED ORDER — SODIUM CHLORIDE 0.9 % IV SOLN
Freq: Once | INTRAVENOUS | Status: AC
  Administered 2012-02-03: 20 mL/h via INTRAVENOUS

## 2012-02-03 MED ORDER — ONDANSETRON HCL 4 MG/2ML IJ SOLN
4.0000 mg | Freq: Once | INTRAMUSCULAR | Status: AC
  Administered 2012-02-03: 4 mg via INTRAVENOUS
  Filled 2012-02-03: qty 2

## 2012-02-03 NOTE — ED Notes (Signed)
2 unsuccessful IV attempts will have another RN attempt.

## 2012-02-03 NOTE — ED Provider Notes (Signed)
Medical screening examination/treatment/procedure(s) were performed by non-physician practitioner and as supervising physician I was immediately available for consultation/collaboration.  Sunnie Nielsen, MD 02/03/12 [phone]

## 2012-02-03 NOTE — ED Provider Notes (Signed)
History     CSN: [id number]  Arrival date & time 02/02/12  2013   First MD Initiated Contact with Patient 02/02/12 2312      Chief Complaint  Patient presents with  . Back Pain  . Shortness of Breath    (Consider location/radiation/quality/duration/timing/severity/associated sxs/prior treatment) HPI  Pt presents to the ED with complaints of SOB and back pain. 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 She says that this morning she felt  fine but when she went to work she developed some mid thoracic back pain with shortness of breath.  She says that exertion makes the SOB worse. She denies any anterior pain. Denies any weakness,  N/V/D/F. Admits to a small amount of coughing. nad vss  Past Medical History  Diagnosis Date  . Unspecified iridocyclitis   . Sarcoidosis     skin, eye, lung    Past Surgical History  Procedure Date  . Bronchoscopy 2003  . Vesicovaginal fistula closure w/ tah   . Appendectomy   . Abdominal hysterectomy     Family History  Problem Relation Age of Onset  . Diabetes    . Cancer Father   . Heart disease Father     History  Substance Use Topics  . Smoking status: Never Smoker   . Smokeless tobacco: Not on file  . Alcohol Use: No    OB History    Grav Para Term Preterm Abortions TAB SAB Ect Mult Living                  Review of Systems  Review of Systems  Gen: no weight loss, fevers, chills, night sweats  Eyes: no discharge or drainage, no occular pain or visual changes  Nose: no epistaxis or rhinorrhea  Mouth: no dental pain, no sore throat  Neck: no neck pain  Lungs:No wheezing,or hemoptysis  +coughing and SOB CV: no chest pain, palpitations, dependent edema or orthopnea  Abd: no abdominal pain, nausea, vomiting  GU: no dysuria or gross hematuria  MSK: +  Mid thoracic back pain Neuro: no headache, no focal neurologic deficits  Skin: no abnormalities Psyche:  negative.   Allergies  Review of patient's allergies indicates no known allergies.  Home Medications   Current Outpatient Rx  Name  Route  Sig  Dispense  Refill  . HYDROCODONE-ACETAMINOPHEN 5-325 MG PO TABS   Oral   Take 1 tablet by mouth every 4 (four) hours as needed for pain.   12 tablet   0   . IBUPROFEN 600 MG PO TABS   Oral   Take 1 tablet (600 mg total) by mouth 3 (three) times daily with meals.   30 tablet   0   . ONDANSETRON 8 MG PO TBDP   Oral   Take 1 tablet (8 mg total) by mouth every 8 (eight) hours as needed for nausea.   12 tablet   0   . PREDNISONE 20 MG PO TABS   Oral   Take 120 mg by mouth daily.           BP 111/65  Pulse 82  Temp 99 F (37.2 C) (Oral)  Resp 16  SpO2 95%  Physical Exam  Nursing note and vitals reviewed. Constitutional: She appears well-developed and well-nourished. No distress.  HENT:  Head: Normocephalic and atraumatic.  Eyes: Pupils are equal, round, and reactive to light.  Neck: Normal range of motion.  Neck supple.  Cardiovascular: Normal rate and regular rhythm.   Pulmonary/Chest: Effort normal.  Abdominal: Soft.  Musculoskeletal:       Right shoulder: She exhibits tenderness.       Arms: Neurological: She is alert.  Skin: Skin is warm and dry.    ED Course  Procedures (including critical care time)  Labs Reviewed  COMPREHENSIVE METABOLIC PANEL - Abnormal; Notable for the following:    Sodium 131 (*)     Glucose, Bld 107 (*)     Albumin 2.5 (*)     All other components within normal limits  CBC WITH DIFFERENTIAL - Abnormal; Notable for the following:    Hemoglobin 11.0 (*)     HCT 34.3 (*)     All other components within normal limits  LIPASE, BLOOD   Dg Chest 2 View  02/02/2012  *RADIOLOGY REPORT*  Clinical Data: History of sarcoidosis.  Upper back pain.  Shortness of breath.  CHEST - 2 VIEW  Comparison: PA and lateral chest 04/16/2010 and 12/01/2011.  Findings: Bilateral pulmonary scarring is  identified as on the prior studies.  No new airspace opacity is seen.  Heart size is upper normal.  There is mediastinal and hilar lymphadenopathy.  No pneumothorax or pleural fluid.  IMPRESSION: No acute finding in patient with sarcoidosis.  Stable compared to prior exam.   Original Report Authenticated By: Holley Dexter, M.D.    Ct Angio Chest Pe W/cm &/or Wo Cm  02/03/2012  *RADIOLOGY REPORT*  Clinical Data: Mid back pain and cough.  History sarcoidosis.  CT ANGIOGRAPHY CHEST  Technique:  Multidetector CT imaging of the chest using the standard protocol during bolus administration of intravenous contrast. Multiplanar reconstructed images including MIPs were obtained and reviewed to evaluate the vascular anatomy.  Contrast: OMNIPAQUE IOHEXOL 350 MG/ML SOLN  Comparison: PA and lateral chest 02/02/2012 and 12/01/2011.  Findings: No pulmonary embolus is identified.  There is mediastinal and hilar lymphadenopathy consistent with history of sarcoidosis. Right hilar lymph node on image 31 measures 1.3 cm.  Prevascular node on image 23 measures 1.2 cm.  Lungs demonstrate extensive basilar fibrosis.  Diffuse ground-glass attenuation is present. Scattered pulmonary nodules are also identified.  Index subpleural nodule in the left upper lobe measures 0.6 cm on image 31.  Right upper lobe nodule also on image 31 measures 0.7 cm.  Incidentally imaged upper abdomen demonstrates fatty infiltration of the liver.  Visualized intra-abdominal contents are otherwise unremarkable.  No focal bony abnormality is identified.  IMPRESSION:  1.  Negative for pulmonary embolus. 2.  Mediastinal and hilar lymphadenopathy and extensive pulmonary fibrosis consistent with history of sarcoidosis.  Scattered pulmonary nodules also likely due to sarcoidosis. 3.  Fatty infiltration of the liver.   Original Report Authenticated By: Holley Dexter, M.D.      1. Dyspnea   2. Back pain   3. History of sarcoidosis       MDM  Dr.  Dierdre Highman saw pt as well and added a CT angio of the chest. The patients labs and ct angio have come back none acute. I have discussed results with Dr. Theodoro Kalata and pt. We will start pt on NSAID reg imine and some Vicodin for break through pain.  Pt has been advised of the symptoms that warrant their return to the ED. Patient has voiced understanding and has agreed to follow-up with the PCP or specialist.       Dorthula Matas, PA 02/03/12 [phone]

## 2012-02-03 NOTE — ED Notes (Signed)
EDP at bedside speaking with pt. 

## 2012-02-23 ENCOUNTER — Ambulatory Visit: Payer: BC Managed Care – PPO

## 2012-02-27 ENCOUNTER — Other Ambulatory Visit: Payer: Self-pay | Admitting: Obstetrics and Gynecology

## 2012-02-27 ENCOUNTER — Ambulatory Visit
Admission: RE | Admit: 2012-02-27 | Discharge: 2012-02-27 | Disposition: A | Discharge status: Home / Self Care | Payer: BC Managed Care – PPO | Source: Ambulatory Visit | Attending: Obstetrics and Gynecology | Admitting: Obstetrics and Gynecology

## 2012-02-27 DIAGNOSIS — Z1231 Encounter for screening mammogram for malignant neoplasm of breast: Secondary | ICD-10-CM

## 2012-05-14 ENCOUNTER — Other Ambulatory Visit: Payer: Self-pay | Admitting: Obstetrics and Gynecology

## 2012-05-14 DIAGNOSIS — E2839 Other primary ovarian failure: Secondary | ICD-10-CM

## 2012-05-25 ENCOUNTER — Other Ambulatory Visit: Payer: BC Managed Care – PPO

## 2012-06-08 ENCOUNTER — Ambulatory Visit
Admission: RE | Admit: 2012-06-08 | Discharge: 2012-06-08 | Disposition: A | Discharge status: Home / Self Care | Payer: BC Managed Care – PPO | Source: Ambulatory Visit | Attending: Obstetrics and Gynecology | Admitting: Obstetrics and Gynecology

## 2012-06-08 DIAGNOSIS — E2839 Other primary ovarian failure: Secondary | ICD-10-CM

## 2013-03-15 ENCOUNTER — Ambulatory Visit: Admission: RE | Admit: 2013-03-15 | Payer: BC Managed Care – PPO | Source: Ambulatory Visit

## 2013-03-15 ENCOUNTER — Ambulatory Visit
Admission: RE | Admit: 2013-03-15 | Discharge: 2013-03-15 | Disposition: A | Discharge status: Home / Self Care | Payer: BC Managed Care – PPO | Source: Ambulatory Visit | Attending: Obstetrics and Gynecology | Admitting: Obstetrics and Gynecology

## 2013-03-15 ENCOUNTER — Other Ambulatory Visit: Payer: Self-pay | Admitting: Obstetrics and Gynecology

## 2013-03-15 DIAGNOSIS — Z1231 Encounter for screening mammogram for malignant neoplasm of breast: Secondary | ICD-10-CM

## 2013-04-11 ENCOUNTER — Emergency Department (HOSPITAL_BASED_OUTPATIENT_CLINIC_OR_DEPARTMENT_OTHER): Payer: BC Managed Care – PPO

## 2013-04-11 ENCOUNTER — Encounter (HOSPITAL_BASED_OUTPATIENT_CLINIC_OR_DEPARTMENT_OTHER): Payer: Self-pay | Admitting: Emergency Medicine

## 2013-04-11 ENCOUNTER — Emergency Department (HOSPITAL_BASED_OUTPATIENT_CLINIC_OR_DEPARTMENT_OTHER)
Admission: EM | Admit: 2013-04-11 | Discharge: 2013-04-11 | Disposition: A | Discharge status: Home / Self Care | Payer: BC Managed Care – PPO | Attending: Emergency Medicine | Admitting: Emergency Medicine

## 2013-04-11 DIAGNOSIS — Y990 Civilian activity done for income or pay: Secondary | ICD-10-CM | POA: Insufficient documentation

## 2013-04-11 DIAGNOSIS — S39012A Strain of muscle, fascia and tendon of lower back, initial encounter: Secondary | ICD-10-CM

## 2013-04-11 DIAGNOSIS — Y9289 Other specified places as the place of occurrence of the external cause: Secondary | ICD-10-CM | POA: Insufficient documentation

## 2013-04-11 DIAGNOSIS — Y9389 Activity, other specified: Secondary | ICD-10-CM | POA: Insufficient documentation

## 2013-04-11 DIAGNOSIS — X503XXA Overexertion from repetitive movements, initial encounter: Secondary | ICD-10-CM | POA: Insufficient documentation

## 2013-04-11 DIAGNOSIS — S335XXA Sprain of ligaments of lumbar spine, initial encounter: Secondary | ICD-10-CM | POA: Insufficient documentation

## 2013-04-11 DIAGNOSIS — Z8619 Personal history of other infectious and parasitic diseases: Secondary | ICD-10-CM | POA: Insufficient documentation

## 2013-04-11 DIAGNOSIS — Z8669 Personal history of other diseases of the nervous system and sense organs: Secondary | ICD-10-CM | POA: Insufficient documentation

## 2013-04-11 DIAGNOSIS — X500XXA Overexertion from strenuous movement or load, initial encounter: Secondary | ICD-10-CM | POA: Insufficient documentation

## 2013-04-11 DIAGNOSIS — IMO0002 Reserved for concepts with insufficient information to code with codable children: Secondary | ICD-10-CM | POA: Insufficient documentation

## 2013-04-11 MED ORDER — IBUPROFEN 800 MG PO TABS: 800.0000 mg | ORAL_TABLET | Freq: Three times a day (TID) | ORAL | Status: DC

## 2013-04-11 MED ORDER — TRAMADOL HCL 50 MG PO TABS: 50.0000 mg | ORAL_TABLET | Freq: Four times a day (QID) | ORAL | Status: DC | PRN

## 2013-04-11 MED ORDER — HYDROCODONE-ACETAMINOPHEN 5-325 MG PO TABS
2.0000 | ORAL_TABLET | Freq: Once | ORAL | Status: AC
  Administered 2013-04-11: 2 via ORAL
  Filled 2013-04-11: qty 2

## 2013-04-11 MED ORDER — CYCLOBENZAPRINE HCL 10 MG PO TABS: 10.0000 mg | ORAL_TABLET | Freq: Three times a day (TID) | ORAL | Status: DC | PRN

## 2013-04-11 NOTE — ED Provider Notes (Signed)
CSN: [id number]     Arrival date & time 04/11/13  4098 History   First MD Initiated Contact with Patient 04/11/13 [phone]     Chief Complaint  Patient presents with  . Back Pain     (Consider location/radiation/quality/duration/timing/severity/associated sxs/prior Treatment) HPI Comments: Patient presents to the ER for evaluation of low back pain. Patient reports that she has had persistent pain in the right lower back for 2 weeks. She reports lifting heavy objects at work prior to onset of the pain. She has not been taking any medications previously. She does report that last night the pain was worse. She had trouble sleeping because she cannot find a comfortable position. She has noted that the pain worsens with changing positions, bending it back. It does not radiate to the legs. No change in strength or sensation. No change in bowel or bladder function.  Patient is a 58 y.o. female presenting with back pain.  Back Pain   Past Medical History  Diagnosis Date  . Unspecified iridocyclitis   . Sarcoidosis     skin, eye, lung   Past Surgical History  Procedure Laterality Date  . Bronchoscopy  2003  . Vesicovaginal fistula closure w/ tah    . Appendectomy    . Abdominal hysterectomy     Family History  Problem Relation Age of Onset  . Diabetes    . Cancer Father   . Heart disease Father    History  Substance Use Topics  . Smoking status: Never Smoker   . Smokeless tobacco: Not on file  . Alcohol Use: No   OB History   Grav Para Term Preterm Abortions TAB SAB Ect Mult Living                 Review of Systems  Genitourinary: Negative.   Musculoskeletal: Positive for back pain.  Neurological: Negative.   All other systems reviewed and are negative.      Allergies  Review of patient's allergies indicates no known allergies.  Home Medications   Current Outpatient Rx  Name  Route  Sig  Dispense  Refill  . HYDROcodone-acetaminophen (NORCO/VICODIN) 5-325 MG per  tablet   Oral   Take 1 tablet by mouth every 4 (four) hours as needed for pain.   12 tablet   0   . ibuprofen (ADVIL,MOTRIN) 600 MG tablet   Oral   Take 1 tablet (600 mg total) by mouth 3 (three) times daily with meals.   30 tablet   0   . ondansetron (ZOFRAN ODT) 8 MG disintegrating tablet   Oral   Take 1 tablet (8 mg total) by mouth every 8 (eight) hours as needed for nausea.   12 tablet   0   . predniSONE (DELTASONE) 20 MG tablet   Oral   Take 120 mg by mouth daily.          BP 163/81  Pulse 68  Temp(Src) 98.6 F (37 C) (Oral)  Resp 16  Wt 169 lb (76.658 kg)  SpO2 100% Physical Exam  Constitutional: She is oriented to person, place, and time. She appears well-developed and well-nourished. No distress.  HENT:  Head: Normocephalic and atraumatic.  Right Ear: Hearing normal.  Left Ear: Hearing normal.  Nose: Nose normal.  Mouth/Throat: Oropharynx is clear and moist and mucous membranes are normal.  Eyes: Conjunctivae and EOM are normal. Pupils are equal, round, and reactive to light.  Neck: Normal range of motion. Neck supple.  Cardiovascular: Regular rhythm,  S1 normal and S2 normal.  Exam reveals no gallop and no friction rub.   No murmur heard. Pulmonary/Chest: Effort normal and breath sounds normal. No respiratory distress. She exhibits no tenderness.  Abdominal: Soft. Normal appearance and bowel sounds are normal. There is no hepatosplenomegaly. There is no tenderness. There is no rebound, no guarding, no tenderness at McBurney's point and negative Murphy's sign. No hernia.  Musculoskeletal: Normal range of motion.       Lumbar back: She exhibits tenderness and spasm. She exhibits no bony tenderness.       Back:  Neurological: She is alert and oriented to person, place, and time. She has normal strength. No cranial nerve deficit or sensory deficit. Coordination normal. GCS eye subscore is 4. GCS verbal subscore is 5. GCS motor subscore is 6.  Skin: Skin is warm,  dry and intact. No rash noted. No cyanosis.  Psychiatric: She has a normal mood and affect. Her speech is normal and behavior is normal. Thought content normal.    ED Course  Procedures (including critical care time) Labs Review Labs Reviewed - No data to display Imaging Review Dg Lumbar Spine Complete  04/11/2013   CLINICAL DATA:  Low back pain.  EXAM: LUMBAR SPINE - COMPLETE 4+ VIEW  COMPARISON:  None.  FINDINGS: No fracture or significant spondylolisthesis is noted. Mild degenerative disc disease is noted at L3-4 with anterior osteophyte formation. Mild degenerative changes seen involving the posterior facet joints at L3-4, L4-5 and L5-S1.  IMPRESSION: Mild degenerative disc disease is noted at L3-4. No acute abnormality seen in the lumbar spine.   Electronically Signed   By: Sabino Dick M.D.   On: 04/11/2013 09:25     EKG Interpretation None      MDM   Final diagnoses:  None    Patient presents to the ER with musculoskeletal back pain. Examination reveals back tenderness without any associated neurologic findings. Patient's strength, sensation and reflexes were normal. X-ray shows mild degenerative changes of any other abnormality. Patient was treated with analgesia.    Orpah Greek, MD 04/11/13 [phone]

## 2013-04-11 NOTE — ED Notes (Signed)
Patient transported to X-ray ambulatory with tech. 

## 2013-04-11 NOTE — ED Notes (Signed)
Back pain for over two weeks. Pt states nagging pain on right side. Denies urinary symptoms. Denies injury.

## 2013-04-11 NOTE — ED Notes (Signed)
MD at bedside. 

## 2013-04-11 NOTE — Discharge Instructions (Signed)
Back Pain, Adult Low back pain is very common. About 1 in 5 people have back pain.The cause of low back pain is rarely dangerous. The pain often gets better over time.About half of people with a sudden onset of back pain feel better in just 2 weeks. About 8 in 10 people feel better by 6 weeks.  CAUSES Some common causes of back pain include:  Strain of the muscles or ligaments supporting the spine.  Wear and tear (degeneration) of the spinal discs.  Arthritis.  Direct injury to the back. DIAGNOSIS Most of the time, the direct cause of low back pain is not known.However, back pain can be treated effectively even when the exact cause of the pain is unknown.Answering your caregiver's questions about your overall health and symptoms is one of the most accurate ways to make sure the cause of your pain is not dangerous. If your caregiver needs more information, he or she may order lab work or imaging tests (X-rays or MRIs).However, even if imaging tests show changes in your back, this usually does not require surgery. HOME CARE INSTRUCTIONS For many people, back pain returns.Since low back pain is rarely dangerous, it is often a condition that people can learn to manageon their own.   Remain active. It is stressful on the back to sit or stand in one place. Do not sit, drive, or stand in one place for more than 30 minutes at a time. Take short walks on level surfaces as soon as pain allows.Try to increase the length of time you walk each day.  Do not stay in bed.Resting more than 1 or 2 days can delay your recovery.  Do not avoid exercise or work.Your body is made to move.It is not dangerous to be active, even though your back may hurt.Your back will likely heal faster if you return to being active before your pain is gone.  Pay attention to your body when you bend and lift. Many people have less discomfortwhen lifting if they bend their knees, keep the load close to their bodies,and  avoid twisting. Often, the most comfortable positions are those that put less stress on your recovering back.  Find a comfortable position to sleep. Use a firm mattress and lie on your side with your knees slightly bent. If you lie on your back, put a pillow under your knees.  Only take over-the-counter or prescription medicines as directed by your caregiver. Over-the-counter medicines to reduce pain and inflammation are often the most helpful.Your caregiver may prescribe muscle relaxant drugs.These medicines help dull your pain so you can more quickly return to your normal activities and healthy exercise.  Put ice on the injured area.  Put ice in a plastic bag.  Place a towel between your skin and the bag.  Leave the ice on for 15-20 minutes, 03-04 times a day for the first 2 to 3 days. After that, ice and heat may be alternated to reduce pain and spasms.  Ask your caregiver about trying back exercises and gentle massage. This may be of some benefit.  Avoid feeling anxious or stressed.Stress increases muscle tension and can worsen back pain.It is important to recognize when you are anxious or stressed and learn ways to manage it.Exercise is a great option. SEEK MEDICAL CARE IF:  You have pain that is not relieved with rest or medicine.  You have pain that does not improve in 1 week.  You have new symptoms.  You are generally not feeling well. SEEK   IMMEDIATE MEDICAL CARE IF:   You have pain that radiates from your back into your legs.  You develop new bowel or bladder control problems.  You have unusual weakness or numbness in your arms or legs.  You develop nausea or vomiting.  You develop abdominal pain.  You feel faint. Document Released: 01/10/2005 Document Revised: 07/12/2011 Document Reviewed: 05/31/2010 [employer] Patient Information 2014 [employer].  

## 2013-09-03 ENCOUNTER — Ambulatory Visit: Payer: BC Managed Care – PPO | Admitting: Internal Medicine

## 2013-10-01 ENCOUNTER — Ambulatory Visit (INDEPENDENT_AMBULATORY_CARE_PROVIDER_SITE_OTHER)
Admission: RE | Admit: 2013-10-01 | Discharge: 2013-10-01 | Disposition: A | Discharge status: Home / Self Care | Payer: BC Managed Care – PPO | Source: Ambulatory Visit | Attending: Internal Medicine | Admitting: Internal Medicine

## 2013-10-01 ENCOUNTER — Encounter (INDEPENDENT_AMBULATORY_CARE_PROVIDER_SITE_OTHER): Payer: Self-pay

## 2013-10-01 ENCOUNTER — Other Ambulatory Visit (INDEPENDENT_AMBULATORY_CARE_PROVIDER_SITE_OTHER): Payer: BC Managed Care – PPO

## 2013-10-01 ENCOUNTER — Encounter: Payer: Self-pay | Admitting: Internal Medicine

## 2013-10-01 ENCOUNTER — Ambulatory Visit (INDEPENDENT_AMBULATORY_CARE_PROVIDER_SITE_OTHER): Payer: BC Managed Care – PPO | Admitting: Internal Medicine

## 2013-10-01 VITALS — BP 126/80 | HR 73 | Ht 62.0 in | Wt 159.4 lb

## 2013-10-01 DIAGNOSIS — D869 Sarcoidosis, unspecified: Secondary | ICD-10-CM

## 2013-10-01 DIAGNOSIS — Z23 Encounter for immunization: Secondary | ICD-10-CM

## 2013-10-01 LAB — BASIC METABOLIC PANEL
BUN: 11 mg/dL (ref 6–23)
CALCIUM: 9.5 mg/dL (ref 8.4–10.5)
CO2: 27 meq/L (ref 19–32)
CREATININE: 0.7 mg/dL (ref 0.4–1.2)
Chloride: 99 mEq/L (ref 96–112)
GFR: 106.65 mL/min (ref >60.00)
Glucose, Bld: 83 mg/dL (ref 70–99)
Potassium: 4 mEq/L (ref 3.5–5.1)
Sodium: 135 mEq/L (ref 135–145)

## 2013-10-01 LAB — HEPATIC FUNCTION PANEL
ALBUMIN: 3.4 g/dL — AB (ref 3.5–5.2)
ALT: 20 U/L (ref 0–35)
AST: 33 U/L (ref 0–37)
Alkaline Phosphatase: 78 U/L (ref 39–117)
BILIRUBIN DIRECT: 0.1 mg/dL (ref 0.0–0.3)
TOTAL PROTEIN: 10 g/dL — AB (ref 6.0–8.3)
Total Bilirubin: 0.4 mg/dL (ref 0.2–1.2)

## 2013-10-01 MED ORDER — HYDROXYCHLOROQUINE SULFATE 200 MG PO TABS: ORAL_TABLET | ORAL | Status: DC

## 2013-10-01 NOTE — Progress Notes (Signed)
Patient ID: Lauren Massey, female    DOB: 04/25/1955, 58 y.o.   MRN: [id number]  HPI 37 yoAAF never smoker, here with daughter today. I have seen her for Sarcoid involving eyes/ iritis, lungs and skin. ACE 06/19/07 was 108 (9-67). She was on plaquenyl for spots around her eyes, and on MTX from her opthalmologist - now off both for over a year. She dropped off these meds after last seen by me 03/17/08. Since December she is coughing when active, with a little blood in clear mucus, and some dyspnea especially supine. Denies nasal congestion. Having Night sweats / change bedclothes, w/o definite fever. Cervical nodes may be swollen. Denies pain. Noting some increased skin nodularity in axillae. 20 lb weight  Loss w/o anorexia, change in bowel or bladder. She feels as she did when originally dx'd by bronchoscopy in 2003. She was treated originally with prednisone.  09/02/10-  4 yoAAF never smoker, here with daughter today. I have seen her for Sarcoid involving eyes/ iritis, lungs and skin. ACE 06/19/07 was 108 (9-67). For the past 6 weeks has noted palpitations with lightheadedness, lasting 5 minutes. May get diaphoretic. She stopped caffeine in response, without change. No more weight loss. Still cough with scant streak of blood in the mornings. Occasional night sweats. No dyspnea or chest pain. Because of abnormal labs, we had her take prednisone starting 3/25-  20 mg x 1 week, then 10 mg daily for 3 more weeks. She felt no different and has been out x 2 weeks.  EKG today- WNL ACE 04/16/10> 100 Hepatic- not done BMET-04/16/10- WNL CXR- 04/16/10- progressive fibrosis, hilar nodes PPD-3/32/12 negative  12/01/11- 11 yoAAF never smoker. I have seen her for Sarcoid involving eyes/ iritis, lungs and skin/ bronchoscopy 2000.  ACE 06/19/07 was 108 (9-67). Dx'd Would like to discuss PNA shot today. Would like Flu shot as well. Denies night sweats. Increased dry cough x months no swollen glands. Complains of rash  around her neck. Eyes burn. Low dose prednisone had little effect in 2012. No TB exposure. Works at [employer]. CXR 04/26/11- reviewed with her IMPRESSION:  1. Progression of parenchymal changes. Differential diagnosis  includes sarcoidosis and superimposed infectious process.  2. Suspect mediastinal adenopathy, also consistent with  sarcoidosis.  Original Report Authenticated By: Glenice Bow, M.D.   10/01/13- 47 yoAAF never smoker. I have seen her for Sarcoid III involving eyes/ iritis, lungs and skin/ bronchoscopy 2000. FOLLOWS FOR: has had no changes in vision since 2 years ago; has dentist appt soon to remove teeth. States she continues to have SOB and wheezing at times. Has been breaking in rash. Rash on neck, upper chest x 3 weeks-not pruritic. Occ sweats, no fever or nodes. Some cough-dry. No change in mild DOE hills and stairs. Still followed by Opthalmology. CT chest 02/03/12 IMPRESSION:  1. Negative for pulmonary embolus.  2. Mediastinal and hilar lymphadenopathy and extensive pulmonary  fibrosis consistent with history of sarcoidosis. Scattered  pulmonary nodules also likely due to sarcoidosis.  3. Fatty infiltration of the liver.  Original Report Authenticated By: Orlean Patten, M.D.   Review of Systems- see HPI Constitutional:   No-   weight loss, +night sweats, fevers, chills, fatigue, lassitude. HEENT:   No-  headaches, difficulty swallowing, tooth/dental problems, sore throat,       No-  sneezing, itching, ear ache, nasal congestion, post nasal drip,  CV:  No-   chest pain, orthopnea, PND, swelling in lower extremities, anasarca,  +  dizziness, palpitations Resp:+shortness of breath with exertion or at rest.              No-   productive cough,  + non-productive cough,  No-  coughing up of blood.              No-   change in color of mucus.  No- wheezing.   Skin: +  rash or lesions. GI:  No-   heartburn, indigestion, abdominal pain, nausea, vomiting,   GU:  MS:  No-   joint pain or swelling.  No- decreased range of motion.  No- back pain. Neuro- nothing unusual Psych:  No- change in mood or affect. No depression or anxiety.  No memory loss.  Objective:   Physical Exam General- Alert, Oriented, Affect-appropriate, Distress- none acute Skin- + multiple small linear plaques along flexure lines left side of neck, down onto upper chest Lymphadenopathy- none Head- atraumatic            Eyes- Gross vision intact, PERRLA, conjunctivae clear secretions            Ears- Hearing, canals normal            Nose- Clear, No-Septal dev, mucus, polyps, erosion, perforation             Throat- Mallampati IV , mucosa clear , drainage- none, tonsils- atrophic Neck- flexible , trachea midline, no stridor , thyroid nl, carotid no bruit Chest - symmetrical excursion , unlabored           Heart/CV- RRR , no murmur , no gallop  , no rub, nl s1 s2                           - JVD- none , edema- none, stasis changes- none, varices- none           Lung- +crackles in lower zones, wheeze- none, cough- none , dullness-none, rub- none                Chest wall-  Abd-  Br/ Gen/ Rectal- Not done, not indicated Extrem- cyanosis- none, clubbing, none, atrophy- none, strength- nl Neuro- grossly intact to observation Assessment & Plan:

## 2013-10-01 NOTE — Patient Instructions (Addendum)
Order- CXR- dx Sarcoid  Order- Schedule PFT   Dx Sarcoid  Order- lab- BMET, hepatic panel, ACE level      Dx sarcoid  Flu vax  Script for plaquenil sent - take 2 daily x 7 days, then one daily  Keep your eye doctor appointment and tell him we have started plaquenil for your sarcoid

## 2013-10-02 LAB — ANGIOTENSIN CONVERTING ENZYME: Angiotensin-Converting Enzyme: 71 U/L — ABNORMAL HIGH (ref 8–52)

## 2013-10-02 NOTE — Assessment & Plan Note (Signed)
Not sure pulmonary status is really changed, but will look at lung, kidney, liver. 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 We discussed and can try plaquenil. Plan- labs, start plaquenil for cutaneous sarcoid, flu vax. She sees opthalm soon and can check with him about the plaquenil/ eye side-effects, as discussed w her.Lauren Massey

## 2013-10-16 ENCOUNTER — Telehealth: Payer: Self-pay | Admitting: Internal Medicine

## 2013-10-16 NOTE — Telephone Encounter (Signed)
Notes Recorded by Lorane Gell, CMA on 10/10/2013 at 2:25 PM [facility] Notes Recorded by Deneise Lever, MD on 10/02/2013 at 2:40 PM Labs- most results are fine. ACE level indicating sarcoid activity is still above normal, but better than a year ago. Kidney and liver tests look pretty good, with some mild abnormalities of blood proteins little changed over the past 3 years.  Notes Recorded by Lorane Gell, CMA on 10/10/2013 at 2:26 PM [facility] ------  Notes Recorded by Lorane Gell, CMA on 10/02/2013 at 9:14 AM [facility] ------  Notes Recorded by Deneise Lever, MD on 10/01/2013 at 5:12 PM CXR- stable sarcoid scarring and enlarged lymph nodes, with no progression or change over the last year and a half.      Pt aware of results. Nothing more needed at this time.

## 2013-12-02 ENCOUNTER — Encounter: Payer: Self-pay | Admitting: Internal Medicine

## 2013-12-02 ENCOUNTER — Encounter (INDEPENDENT_AMBULATORY_CARE_PROVIDER_SITE_OTHER): Payer: Self-pay

## 2013-12-02 ENCOUNTER — Ambulatory Visit (INDEPENDENT_AMBULATORY_CARE_PROVIDER_SITE_OTHER): Payer: BC Managed Care – PPO | Admitting: Internal Medicine

## 2013-12-02 ENCOUNTER — Other Ambulatory Visit: Payer: BC Managed Care – PPO

## 2013-12-02 ENCOUNTER — Encounter: Payer: Self-pay | Admitting: *Deleted

## 2013-12-02 ENCOUNTER — Other Ambulatory Visit: Payer: Self-pay | Admitting: Internal Medicine

## 2013-12-02 VITALS — BP 138/80 | HR 97 | Ht 62.5 in | Wt 154.0 lb

## 2013-12-02 DIAGNOSIS — E8809 Other disorders of plasma-protein metabolism, not elsewhere classified: Secondary | ICD-10-CM

## 2013-12-02 DIAGNOSIS — D869 Sarcoidosis, unspecified: Secondary | ICD-10-CM

## 2013-12-02 LAB — PULMONARY FUNCTION TEST
DL/VA % pred: 100 %
DL/VA: 4.62 ml/min/mmHg/L
DLCO unc % pred: 61 %
DLCO unc: 13.62 ml/min/mmHg
FEF 25-75 PRE: 1.44 L/s
FEF 25-75 Post: 1.68 L/sec
FEF2575-%Change-Post: 16 %
FEF2575-%Pred-Post: 83 %
FEF2575-%Pred-Pre: 71 %
FEV1-%Change-Post: 3 %
FEV1-%Pred-Post: 81 %
FEV1-%Pred-Pre: 78 %
FEV1-Post: 1.61 L
FEV1-Pre: 1.56 L
FEV1FVC-%Change-Post: 4 %
FEV1FVC-%Pred-Pre: 100 %
FEV6-%CHANGE-POST: 0 %
FEV6-%PRED-POST: 79 %
FEV6-%PRED-PRE: 79 %
FEV6-POST: 1.93 L
FEV6-PRE: 1.93 L
FEV6FVC-%CHANGE-POST: 0 %
FEV6FVC-%Pred-Post: 104 %
FEV6FVC-%Pred-Pre: 103 %
FVC-%CHANGE-POST: 0 %
FVC-%PRED-POST: 76 %
FVC-%Pred-Pre: 77 %
FVC-Post: 1.93 L
FVC-Pre: 1.95 L
POST FEV6/FVC RATIO: 100 %
Post FEV1/FVC ratio: 83 %
Pre FEV1/FVC ratio: 80 %
Pre FEV6/FVC Ratio: 99 %
RV % pred: 59 %
RV: 1.12 L
TLC % PRED: 62 %
TLC: 3 L

## 2013-12-02 MED ORDER — BENZONATATE 200 MG PO CAPS: 200.0000 mg | ORAL_CAPSULE | Freq: Three times a day (TID) | ORAL | Status: DC | PRN

## 2013-12-02 NOTE — Progress Notes (Signed)
Patient ID: Lauren Massey, female    DOB: [birth year], 58 y.o.   MRN: [id number]  HPI 79 yoAAF never smoker, here with daughter today. I have seen her for Sarcoid involving eyes/ iritis, lungs and skin. ACE 06/19/07 was 108 (9-67). She was on plaquenyl for spots around her eyes, and on MTX from her opthalmologist - now off both for over a year. She dropped off these meds after last seen by me 03/17/08. Since December she is coughing when active, with a little blood in clear mucus, and some dyspnea especially supine. Denies nasal congestion. Having Night sweats / change bedclothes, w/o definite fever. Cervical nodes may be swollen. Denies pain. Noting some increased skin nodularity in axillae. 20 lb weight  Loss w/o anorexia, change in bowel or bladder. She feels as she did when originally dx'd by bronchoscopy in 2003. She was treated originally with prednisone.  09/02/10-  50 yoAAF never smoker, here with daughter today. I have seen her for Sarcoid involving eyes/ iritis, lungs and skin. ACE 06/19/07 was 108 (9-67). For the past 6 weeks has noted palpitations with lightheadedness, lasting 5 minutes. May get diaphoretic. She stopped caffeine in response, without change. No more weight loss. Still cough with scant streak of blood in the mornings. Occasional night sweats. No dyspnea or chest pain. Because of abnormal labs, we had her take prednisone starting 3/25-  20 mg x 1 week, then 10 mg daily for 3 more weeks. She felt no different and has been out x 2 weeks.  EKG today- WNL ACE 04/16/10> 100 Hepatic- not done BMET-04/16/10- WNL CXR- 04/16/10- progressive fibrosis, hilar nodes PPD-3/32/12 negative  12/01/11- 68 yoAAF never smoker. I have seen her for Sarcoid involving eyes/ iritis, lungs and skin/ bronchoscopy 2000.  ACE 06/19/07 was 108 (9-67). Dx'd Would like to discuss PNA shot today. Would like Flu shot as well. Denies night sweats. Increased dry cough x months no swollen glands. Complains of rash  around her neck. Eyes burn. Low dose prednisone had little effect in 2012. No TB exposure. Works at [employer]. CXR 04/26/11- reviewed with her IMPRESSION:  1. Progression of parenchymal changes. Differential diagnosis  includes sarcoidosis and superimposed infectious process.  2. Suspect mediastinal adenopathy, also consistent with  sarcoidosis.  Original Report Authenticated By: Glenice Bow, M.D.   10/01/13- 62 yoAAF never smoker. I have seen her for Sarcoid III involving eyes/ iritis, lungs and skin/ bronchoscopy 2000. FOLLOWS FOR: has had no changes in vision since 2 years ago; has dentist appt soon to remove teeth. States she continues to have SOB and  wheezing at times. Has been breaking in rash. Rash on neck, upper chest x 3 weeks-not pruritic. Occ sweats, no fever or nodes. Some cough-dry. No change in mild DOE hills and stairs. Still followed by Opthalmology. CT chest 02/03/12 IMPRESSION:  1. Negative for pulmonary embolus.  2. Mediastinal and hilar lymphadenopathy and extensive pulmonary  fibrosis consistent with history of sarcoidosis. Scattered  pulmonary nodules also likely due to sarcoidosis.  3. Fatty infiltration of the liver.  Original Report Authenticated By: Orlean Patten, M.D.  [birth year]-58 yoAAF never smoker. I have seen her for Sarcoid III involving eyes/ iritis, lungs and skin/ bronchoscopy 2000.   Follow up with PFTs.  Increased dry cough since starting plaquenil.  Cough is nonprod.  DOE is unchanged.   PFT: 12/02/2013-minimal obstructive disease with insignificant response to bronchodilator, moderate restriction, moderate reduction of diffusion capacity. FEV1/FVC 0.83, TLC 62%, DLCO 61%. ACE  level 10/01/13- 71, down from 100 CXR 10/01/13 FINDINGS: Heart size is upper limits normal. There are patchy densities throughout the lungs bilaterally, primarily involving the bases. The overall appearance is similar to the prior study without  new consolidation. Mediastinal with is likewise stable and patient has known hilar and mediastinal adenopathy.  Review of Systems- see HPI Constitutional:   No-   weight loss, +night sweats, fevers, chills, fatigue, lassitude. HEENT:   No-  headaches, difficulty swallowing, tooth/dental problems, sore throat,       No-  sneezing, itching, ear ache, nasal congestion, post nasal drip,  CV:  No-   chest pain, orthopnea, PND, swelling in lower extremities, anasarca,  + dizziness, palpitations Resp:+shortness of breath with exertion or at rest.              No-   productive cough,  + non-productive cough,  No-  coughing up of blood.              No-   change in color of mucus.  No- wheezing.   Skin: +  rash or lesions. GI:  No-   heartburn, indigestion, abdominal pain, nausea, vomiting,  GU:  MS:  No-   joint pain or swelling.  No- decreased range of motion.  No- back pain. Neuro- nothing unusual Psych:  No- change in mood or affect. No depression or anxiety.  No memory loss.  Objective:   Physical Exam General- Alert, Oriented, Affect-appropriate, Distress- none acute Skin- + multiple small linear plaques along flexure lines left side of neck, down onto upper chest, unchanged Lymphadenopathy- none Head- atraumatic            Eyes- Gross vision intact, PERRLA, conjunctivae clear secretions            Ears- Hearing, canals normal            Nose- Clear, No-Septal dev, mucus, polyps, erosion, perforation             Throat- Mallampati IV , mucosa clear , drainage- none, tonsils- atrophic Neck- flexible , trachea midline, no stridor , thyroid nl, carotid no bruit Chest - symmetrical excursion , unlabored           Heart/CV- RRR , no murmur , no gallop  , no rub, nl s1 s2                           - JVD- none , edema- none, stasis changes- none, varices- none           Lung- + few crackles in lower zones, wheeze- none, cough- none ,                       dullness-none, rub- none                 Chest wall-  Abd-  Br/ Gen/ Rectal- Not done, not indicated Extrem- cyanosis- none, clubbing, none, atrophy- none, strength- nl Neuro- grossly intact to observation Assessment & Plan:

## 2013-12-02 NOTE — Progress Notes (Signed)
PFT done today. 

## 2013-12-02 NOTE — Patient Instructions (Addendum)
Order- lab- Serum protein electrophoresis     Dx hyperproteinemia, sarcoid  We can continue plaquenil for now  Script to try benzonatate perles for cough  Please call as needed

## 2013-12-06 LAB — PROTEIN ELECTROPHORESIS, SERUM
Albumin ELP: 38.7 % — ABNORMAL LOW (ref 55.8–66.1)
Alpha-1-Globulin: 3.6 % (ref 2.9–4.9)
Alpha-2-Globulin: 8.8 % (ref 7.1–11.8)
BETA GLOBULIN: 6.4 % (ref 4.7–7.2)
Beta 2: 7.6 % — ABNORMAL HIGH (ref 3.2–6.5)
Gamma Globulin: 34.9 % — ABNORMAL HIGH (ref 11.1–18.8)
TOTAL PROTEIN, SERUM ELECTROPHOR: 9.7 g/dL — AB (ref 6.0–8.3)

## 2013-12-06 NOTE — Progress Notes (Signed)
Quick Note:  Called customer service at [phone] and spoke with rep  Serum IFE test will be added  ______

## 2013-12-10 LAB — IMMUNOFIXATION ELECTROPHORESIS
IGA: 816 mg/dL — AB (ref 69–380)
IGG (IMMUNOGLOBIN G), SERUM: 3290 mg/dL — AB (ref 690–1700)
IgM, Serum: 54 mg/dL (ref 52–322)
Total Protein, Serum Electrophoresis: 9.8 g/dL — ABNORMAL HIGH (ref 6.0–8.3)

## 2013-12-12 ENCOUNTER — Telehealth: Payer: Self-pay

## 2013-12-12 NOTE — Telephone Encounter (Signed)
-----   Message from Deneise Lever, MD sent at 12/12/2013  1:30 PM EST ----- Lab- immunoglobulin (antibody) levels are broadly elevated. No deficiency. We can watch this for now.

## 2013-12-17 MED ORDER — HYDROXYCHLOROQUINE SULFATE 200 MG PO TABS: ORAL_TABLET | ORAL | Status: DC

## 2013-12-17 NOTE — Telephone Encounter (Signed)
Pt aware of results.    Pt would like to have refill of PLAQUENIL 200 MG tablet as well. Please advise on refill. Thanks.

## 2013-12-17 NOTE — Addendum Note (Signed)
Addended by: Lorane Gell on: 12/17/2013 05:25 PM   Modules accepted: Orders

## 2013-12-17 NOTE — Telephone Encounter (Signed)
Per CY okay to refill; patient aware and Rx sent to [facility] [place]

## 2013-12-17 NOTE — Telephone Encounter (Signed)
Ok

## 2013-12-22 NOTE — Assessment & Plan Note (Signed)
Has recently started plaquenil. We discussed common use for cutaneous sarcoid. Her renal and liver function tests are stable, chest x-ray stable and ACE level looks better. Plan-continue Plaquenil, recheck persistent elevated serum proteins with SPEP

## 2014-03-13 ENCOUNTER — Other Ambulatory Visit: Payer: Self-pay | Admitting: *Deleted

## 2014-03-13 MED ORDER — BENZONATATE 200 MG PO CAPS: 200.0000 mg | ORAL_CAPSULE | Freq: Three times a day (TID) | ORAL | Status: DC | PRN

## 2014-03-13 NOTE — Telephone Encounter (Signed)
Refill request for Bezonatate approved by Dr. Annamaria Boots for PRN refills.  Refill sent.  Nothing further needed.

## 2014-03-19 ENCOUNTER — Other Ambulatory Visit: Payer: Self-pay

## 2014-03-19 DIAGNOSIS — Z1231 Encounter for screening mammogram for malignant neoplasm of breast: Secondary | ICD-10-CM

## 2014-03-24 ENCOUNTER — Ambulatory Visit
Admission: RE | Admit: 2014-03-24 | Discharge: 2014-03-24 | Disposition: A | Discharge status: Home / Self Care | Payer: BLUE CROSS/BLUE SHIELD | Source: Ambulatory Visit

## 2014-03-24 DIAGNOSIS — Z1231 Encounter for screening mammogram for malignant neoplasm of breast: Secondary | ICD-10-CM

## 2014-05-02 ENCOUNTER — Other Ambulatory Visit: Payer: Self-pay | Admitting: *Deleted

## 2014-05-02 MED ORDER — HYDROXYCHLOROQUINE SULFATE 200 MG PO TABS
ORAL_TABLET | ORAL | Status: DC
Start: 2014-05-02 — End: 2014-08-18

## 2014-06-02 ENCOUNTER — Ambulatory Visit: Payer: BC Managed Care – PPO | Admitting: Internal Medicine

## 2014-08-18 ENCOUNTER — Ambulatory Visit (INDEPENDENT_AMBULATORY_CARE_PROVIDER_SITE_OTHER): Payer: BLUE CROSS/BLUE SHIELD | Admitting: Internal Medicine

## 2014-08-18 ENCOUNTER — Other Ambulatory Visit (INDEPENDENT_AMBULATORY_CARE_PROVIDER_SITE_OTHER): Payer: BLUE CROSS/BLUE SHIELD

## 2014-08-18 ENCOUNTER — Ambulatory Visit (INDEPENDENT_AMBULATORY_CARE_PROVIDER_SITE_OTHER)
Admission: RE | Admit: 2014-08-18 | Discharge: 2014-08-18 | Disposition: A | Discharge status: Home / Self Care | Payer: BLUE CROSS/BLUE SHIELD | Source: Ambulatory Visit | Attending: Internal Medicine | Admitting: Internal Medicine

## 2014-08-18 ENCOUNTER — Encounter: Payer: Self-pay | Admitting: Internal Medicine

## 2014-08-18 VITALS — BP 130/76 | HR 74 | Ht 62.0 in | Wt 149.0 lb

## 2014-08-18 DIAGNOSIS — D869 Sarcoidosis, unspecified: Secondary | ICD-10-CM

## 2014-08-18 LAB — BASIC METABOLIC PANEL
BUN: 11 mg/dL (ref 6–23)
CO2: 32 mEq/L (ref 19–32)
CREATININE: 0.69 mg/dL (ref 0.40–1.20)
Calcium: 9.6 mg/dL (ref 8.4–10.5)
Chloride: 100 mEq/L (ref 96–112)
GFR: 111.68 mL/min (ref >60.00)
Glucose, Bld: 95 mg/dL (ref 70–99)
Potassium: 3.6 mEq/L (ref 3.5–5.1)
Sodium: 135 mEq/L (ref 135–145)

## 2014-08-18 NOTE — Patient Instructions (Signed)
Order- lab ACE level, BMET,    Dx sarcoid  Order- CXR   Dx Sarcoid

## 2014-08-18 NOTE — Progress Notes (Signed)
Patient ID: Lauren Massey, female    DOB: [birth year], 59 y.o.   MRN: [id number]  HPI 79 yoAAF never smoker, here with daughter today. I have seen her for Sarcoid involving eyes/ iritis, lungs and skin. ACE 06/19/07 was 108 (9-67). She was on plaquenyl for spots around her eyes, and on MTX from her opthalmologist - now off both for over a year. She dropped off these meds after last seen by me 03/17/08. Since December she is coughing when active, with a little blood in clear mucus, and some dyspnea especially supine. Denies nasal congestion. Having Night sweats / change bedclothes, w/o definite fever. Cervical nodes may be swollen. Denies pain. Noting some increased skin nodularity in axillae. 20 lb weight  Loss w/o anorexia, change in bowel or bladder. She feels as she did when originally dx'd by bronchoscopy in 2003. She was treated originally with prednisone.  09/02/10-  50 yoAAF never smoker, here with daughter today. I have seen her for Sarcoid involving eyes/ iritis, lungs and skin. ACE 06/19/07 was 108 (9-67). For the past 6 weeks has noted palpitations with lightheadedness, lasting 5 minutes. May get diaphoretic. She stopped caffeine in response, without change. No more weight loss. Still cough with scant streak of blood in the mornings. Occasional night sweats. No dyspnea or chest pain. Because of abnormal labs, we had her take prednisone starting 3/25-  20 mg x 1 week, then 10 mg daily for 3 more weeks. She felt no different and has been out x 2 weeks.  EKG today- WNL ACE 04/16/10> 100 Hepatic- not done BMET-04/16/10- WNL CXR- 04/16/10- progressive fibrosis, hilar nodes PPD-3/32/12 negative  12/01/11- 68 yoAAF never smoker. I have seen her for Sarcoid involving eyes/ iritis, lungs and skin/ bronchoscopy 2000.  ACE 06/19/07 was 108 (9-67). Dx'd Would like to discuss PNA shot today. Would like Flu shot as well. Denies night sweats. Increased dry cough x months no swollen glands. Complains of rash  around her neck. Eyes burn. Low dose prednisone had little effect in 2012. No TB exposure. Works at [employer]. CXR 04/26/11- reviewed with her IMPRESSION:  1. Progression of parenchymal changes. Differential diagnosis  includes sarcoidosis and superimposed infectious process.  2. Suspect mediastinal adenopathy, also consistent with  sarcoidosis.  Original Report Authenticated By: Glenice Bow, M.D.   10/01/13- 62 yoAAF never smoker. I have seen her for Sarcoid III involving eyes/ iritis, lungs and skin/ bronchoscopy 2000. FOLLOWS FOR: has had no changes in vision since 2 years ago; has dentist appt soon to remove teeth. States she continues to have SOB and  wheezing at times. Has been breaking in rash. Rash on neck, upper chest x 3 weeks-not pruritic. Occ sweats, no fever or nodes. Some cough-dry. No change in mild DOE hills and stairs. Still followed by Opthalmology. CT chest 02/03/12 IMPRESSION:  1. Negative for pulmonary embolus.  2. Mediastinal and hilar lymphadenopathy and extensive pulmonary  fibrosis consistent with history of sarcoidosis. Scattered  pulmonary nodules also likely due to sarcoidosis.  3. Fatty infiltration of the liver.  Original Report Authenticated By: Orlean Patten, M.D.  [birth year]-58 yoAAF never smoker. I have seen her for Sarcoid III involving eyes/ iritis, lungs and skin/ bronchoscopy 2000.   Follow up with PFTs.  Increased dry cough since starting plaquenil.  Cough is nonprod.  DOE is unchanged.   PFT: 12/02/2013-minimal obstructive disease with insignificant response to bronchodilator, moderate restriction, moderate reduction of diffusion capacity. FEV1/FVC 0.83, TLC 62%, DLCO 61%. ACE  level 10/01/13- 71, down from 100 CXR 10/01/13 FINDINGS: Heart size is upper limits normal. There are patchy densities throughout the lungs bilaterally, primarily involving the bases. The overall appearance is similar to the prior study without  new consolidation. Mediastinal with is likewise stable and patient has known hilar and mediastinal adenopathy.  08/18/14- 89 yoAAF never smoker. I have seen her for Sarcoid III involving eyes/ iritis, lungs and skin/ bronchoscopy 2000. FOLLOWS [id number] states she is feeling GOOD, patient states that she noticed since losing weight she is feeling alot better.Patient did state that after eating a meal usually about an hour later she feels hungry as if she hasn't eaten BUT she can drink something  & it will pass. Patient said she can go all day w/o eating, & when she does eat she only eats a little. Quit Plaquenil which did not help skin rash on neck but the rash is no worse.  Review of Systems- see HPI Constitutional:   No-   weight loss, +night sweats, fevers, chills, fatigue, lassitude. HEENT:   No-  headaches, difficulty swallowing, tooth/dental problems, sore throat,       No-  sneezing, itching, ear ache, nasal congestion, post nasal drip,  CV:  No-   chest pain, orthopnea, PND, swelling in lower extremities, anasarca,  + dizziness, palpitations Resp:+shortness of breath with exertion or at rest.              No-   productive cough,  + non-productive cough,  No-  coughing up of blood.              No-   change in color of mucus.  No- wheezing.   Skin: +  rash or lesions. GI:  No-   heartburn, indigestion, abdominal pain, nausea, vomiting,  GU:  MS:  No-   joint pain or swelling.  No- decreased range of motion.  No- back pain. Neuro- nothing unusual Psych:  No- change in mood or affect. No depression or anxiety.  No memory loss.  Objective:   Physical Exam General- Alert, Oriented, Affect-appropriate, Distress- none acute Skin- + multiple small linear plaques along flexure lines left side of neck, down onto upper chest, unchanged. in the right posterior axillary line and extensor surfaces of forearms and elbows she has stable macular lesions which she says itch and are  tender. Lymphadenopathy- none Head- atraumatic            Eyes- Gross vision intact, PERRLA, conjunctivae clear secretions            Ears- Hearing, canals normal            Nose- Clear, No-Septal dev, mucus, polyps, erosion, perforation             Throat- Mallampati IV , mucosa clear , drainage- none, tonsils- atrophic Neck- flexible , trachea midline, no stridor , thyroid nl, carotid no bruit Chest - symmetrical excursion , unlabored           Heart/CV- RRR , no murmur , no gallop  , no rub, nl s1 s2                           - JVD- none , edema- none, stasis changes- none, varices- none           Lung- clear, wheeze- none, cough- none , dullness-none, rub- none  Chest wall-  Abd- no enlargement liver or spleen Br/ Gen/ Rectal- Not done, not indicated Extrem- cyanosis- none, clubbing, none, atrophy- none, strength- nl Neuro- grossly intact to observation Assessment & Plan:

## 2014-08-19 LAB — ANGIOTENSIN CONVERTING ENZYME: Angiotensin-Converting Enzyme: >100 U/L — ABNORMAL HIGH (ref 8–52)

## 2014-08-20 ENCOUNTER — Telehealth: Payer: Self-pay | Admitting: Internal Medicine

## 2014-08-20 NOTE — Telephone Encounter (Signed)
Per CXR result note: Notes Recorded by Deneise Lever, MD on 08/19/2014 at 2:21 PM CXR - there is stable old scarring consistent with known sarcoid. No change and no evidence of active lung disease now  Per lab result note: Notes Recorded by Deneise Lever, MD on 08/19/2014 at 2:23 PM The ACE test is higher this time. There may be active sarcoid somewhere outside the lung, but other kinds of inflammation including low-grade infections might do this. We will continue to watch for now. Let us know if something changes ---  lmtcb x1

## 2014-08-21 NOTE — Telephone Encounter (Signed)
Patient states please leave results on VM.

## 2014-08-21 NOTE — Telephone Encounter (Signed)
Patient returned call.  Can be reached at [phone]

## 2014-08-21 NOTE — Telephone Encounter (Signed)
Per pt's request, left detailed message on voicemail advising patient of CXR and Lab results. Requesting that patient call back if she has any questions. Nothing further needed.

## 2014-08-21 NOTE — Telephone Encounter (Signed)
LMTCB x2  

## 2014-11-10 NOTE — Assessment & Plan Note (Signed)
Skin lesions probably from sarcoid, unresponsive to Plaquenil but stable Plan-update labs preference of sarcoid activity-ACE level, chemistry, chest x-ray

## 2015-02-19 ENCOUNTER — Telehealth: Payer: Self-pay | Admitting: Internal Medicine

## 2015-02-19 ENCOUNTER — Other Ambulatory Visit (INDEPENDENT_AMBULATORY_CARE_PROVIDER_SITE_OTHER): Payer: BLUE CROSS/BLUE SHIELD

## 2015-02-19 ENCOUNTER — Encounter: Payer: Self-pay | Admitting: Internal Medicine

## 2015-02-19 ENCOUNTER — Ambulatory Visit (INDEPENDENT_AMBULATORY_CARE_PROVIDER_SITE_OTHER): Payer: BLUE CROSS/BLUE SHIELD | Admitting: Internal Medicine

## 2015-02-19 ENCOUNTER — Ambulatory Visit (INDEPENDENT_AMBULATORY_CARE_PROVIDER_SITE_OTHER)
Admission: RE | Admit: 2015-02-19 | Discharge: 2015-02-19 | Disposition: A | Discharge status: Home / Self Care | Payer: BLUE CROSS/BLUE SHIELD | Source: Ambulatory Visit | Attending: Internal Medicine | Admitting: Internal Medicine

## 2015-02-19 VITALS — BP 122/68 | HR 69 | Ht 62.0 in | Wt 141.6 lb

## 2015-02-19 DIAGNOSIS — Z23 Encounter for immunization: Secondary | ICD-10-CM | POA: Diagnosis not present

## 2015-02-19 DIAGNOSIS — D869 Sarcoidosis, unspecified: Secondary | ICD-10-CM

## 2015-02-19 LAB — CBC WITH DIFFERENTIAL/PLATELET
Basophils Absolute: 0 10*3/uL (ref 0.0–0.1)
Basophils Relative: 0.2 % (ref 0.0–3.0)
EOS ABS: 0.1 10*3/uL (ref 0.0–0.7)
EOS PCT: 2.7 % (ref 0.0–5.0)
HCT: 35.5 % — ABNORMAL LOW (ref 36.0–46.0)
HEMOGLOBIN: 11.4 g/dL — AB (ref 12.0–15.0)
LYMPHS PCT: 37.9 % (ref 12.0–46.0)
Lymphs Abs: 1.6 10*3/uL (ref 0.7–4.0)
MCHC: 32.2 g/dL (ref 30.0–36.0)
MCV: 82.2 fl (ref 78.0–100.0)
Monocytes Absolute: 0.5 10*3/uL (ref 0.1–1.0)
Monocytes Relative: 11.4 % (ref 3.0–12.0)
Neutro Abs: 2 10*3/uL (ref 1.4–7.7)
Neutrophils Relative %: 47.8 % (ref 43.0–77.0)
Platelets: 220 10*3/uL (ref 150.0–400.0)
RBC: 4.32 Mil/uL (ref 3.87–5.11)
RDW: 15.3 % (ref 11.5–15.5)
WBC: 4.2 10*3/uL (ref 4.0–10.5)

## 2015-02-19 LAB — HEPATIC FUNCTION PANEL
ALBUMIN: 3.5 g/dL (ref 3.5–5.2)
ALK PHOS: 84 U/L (ref 39–117)
ALT: 14 U/L (ref 0–35)
AST: 25 U/L (ref 0–37)
Bilirubin, Direct: 0.1 mg/dL (ref 0.0–0.3)
TOTAL PROTEIN: 9.7 g/dL — AB (ref 6.0–8.3)
Total Bilirubin: 0.3 mg/dL (ref 0.2–1.2)

## 2015-02-19 LAB — BASIC METABOLIC PANEL
BUN: 9 mg/dL (ref 6–23)
CO2: 31 meq/L (ref 19–32)
Calcium: 9.4 mg/dL (ref 8.4–10.5)
Chloride: 99 mEq/L (ref 96–112)
Creatinine, Ser: 0.59 mg/dL (ref 0.40–1.20)
GFR: 133.57 mL/min (ref >60.00)
GLUCOSE: 89 mg/dL (ref 70–99)
POTASSIUM: 4.1 meq/L (ref 3.5–5.1)
Sodium: 135 mEq/L (ref 135–145)

## 2015-02-19 MED ORDER — BENZONATATE 200 MG PO CAPS: 200.0000 mg | ORAL_CAPSULE | Freq: Three times a day (TID) | ORAL | Status: DC | PRN

## 2015-02-19 MED ORDER — PREDNISONE 10 MG PO TABS
ORAL_TABLET | ORAL | Status: DC
Start: 2015-02-19 — End: 2015-04-16

## 2015-02-19 NOTE — Patient Instructions (Signed)
Script sent benzonatate perles for cough  Flu vax  Script  Sent for prednisone   Order- labs-  CBC w diff, BMET, Hepatic panel, ACE level    Dx sarcoid  Order- CXR   Dx sarcoid  Order- schedule PFT    Dx sarcoid

## 2015-02-19 NOTE — Progress Notes (Signed)
Patient ID: Lauren MUMPOWER, female    DOB: [birth year], 60 y.o.   MRN: [id number]  HPI 79 yoAAF never smoker, here with daughter today. I have seen her for Sarcoid involving eyes/ iritis, lungs and skin. ACE 06/19/07 was 108 (9-67). She was on plaquenyl for spots around her eyes, and on MTX from her opthalmologist - now off both for over a year. She dropped off these meds after last seen by me 03/17/08. Since December she is coughing when active, with a little blood in clear mucus, and some dyspnea especially supine. Denies nasal congestion. Having Night sweats / change bedclothes, w/o definite fever. Cervical nodes may be swollen. Denies pain. Noting some increased skin nodularity in axillae. 20 lb weight  Loss w/o anorexia, change in bowel or bladder. She feels as she did when originally dx'd by bronchoscopy in 2003. She was treated originally with prednisone.  09/02/10-  50 yoAAF never smoker, here with daughter today. I have seen her for Sarcoid involving eyes/ iritis, lungs and skin. ACE 06/19/07 was 108 (9-67). For the past 6 weeks has noted palpitations with lightheadedness, lasting 5 minutes. May get diaphoretic. She stopped caffeine in response, without change. No more weight loss. Still cough with scant streak of blood in the mornings. Occasional night sweats. No dyspnea or chest pain. Because of abnormal labs, we had her take prednisone starting 3/25-  20 mg x 1 week, then 10 mg daily for 3 more weeks. She felt no different and has been out x 2 weeks.  EKG today- WNL ACE 04/16/10> 100 Hepatic- not done BMET-04/16/10- WNL CXR- 04/16/10- progressive fibrosis, hilar nodes PPD-3/32/12 negative  12/01/11- 68 yoAAF never smoker. I have seen her for Sarcoid involving eyes/ iritis, lungs and skin/ bronchoscopy 2000.  ACE 06/19/07 was 108 (9-67). Dx'd Would like to discuss PNA shot today. Would like Flu shot as well. Denies night sweats. Increased dry cough x months no swollen glands. Complains of rash  around her neck. Eyes burn. Low dose prednisone had little effect in 2012. No TB exposure. Works at [employer]. CXR 04/26/11- reviewed with her IMPRESSION:  1. Progression of parenchymal changes. Differential diagnosis  includes sarcoidosis and superimposed infectious process.  2. Suspect mediastinal adenopathy, also consistent with  sarcoidosis.  Original Report Authenticated By: Glenice Bow, M.D.   10/01/13- 62 yoAAF never smoker. I have seen her for Sarcoid III involving eyes/ iritis, lungs and skin/ bronchoscopy 2000. FOLLOWS FOR: has had no changes in vision since 2 years ago; has dentist appt soon to remove teeth. States she continues to have SOB and  wheezing at times. Has been breaking in rash. Rash on neck, upper chest x 3 weeks-not pruritic. Occ sweats, no fever or nodes. Some cough-dry. No change in mild DOE hills and stairs. Still followed by Opthalmology. CT chest 02/03/12 IMPRESSION:  1. Negative for pulmonary embolus.  2. Mediastinal and hilar lymphadenopathy and extensive pulmonary  fibrosis consistent with history of sarcoidosis. Scattered  pulmonary nodules also likely due to sarcoidosis.  3. Fatty infiltration of the liver.  Original Report Authenticated By: Orlean Patten, M.D.  [birth year]-58 yoAAF never smoker. I have seen her for Sarcoid III involving eyes/ iritis, lungs and skin/ bronchoscopy 2000.   Follow up with PFTs.  Increased dry cough since starting plaquenil.  Cough is nonprod.  DOE is unchanged.   PFT: 12/02/2013-minimal obstructive disease with insignificant response to bronchodilator, moderate restriction, moderate reduction of diffusion capacity. FEV1/FVC 0.83, TLC 62%, DLCO 61%. ACE  level 10/01/13- 71, down from 100 CXR 10/01/13 FINDINGS: Heart size is upper limits normal. There are patchy densities throughout the lungs bilaterally, primarily involving the bases. The overall appearance is similar to the prior study without  new consolidation. Mediastinal with is likewise stable and patient has known hilar and mediastinal adenopathy.  08/18/14- 42 yoAAF never smoker. I have seen her for Sarcoid III involving eyes/ iritis, lungs and skin/ bronchoscopy 2000. FOLLOWS [id number] states she is feeling GOOD, patient states that she noticed since losing weight she is feeling alot better.Patient did state that after eating a meal usually about an hour later she feels hungry as if she hasn't eaten BUT she can drink something  & it will pass. Patient said she can go all day w/o eating, & when she does eat she only eats a little. Quit Plaquenil which did not help skin rash on neck but the rash is no worse.  02/19/2015-60 year old female never smoker followed for Sarcoid III involving eyes/iritis, lungs and skin-bronchoscopy 2000. Failed Plaquenil for skin rash. CXR 08/19/2014-stable chronic patchy scarring bilateral lung bases and right upper lobe, no active cardiopulmonary disease. ACE 08/18/2014- > 100, BMET WNL,  FOLLOWS FOR: Pt states she has noticed SOB-"gives out" since last visit. 8 pound weight loss in the last 6 months. Increased cough productive clear mucus. Occasional night sweats. Increased rash around her neck. Denies adenopathy, chest pain, blood.  Review of Systems- see HPI Constitutional:  + weight loss, +night sweats, fevers, chills, fatigue, lassitude. HEENT:   No-  headaches, difficulty swallowing, tooth/dental problems, sore throat,       No-  sneezing, itching, ear ache, nasal congestion, post nasal drip,  CV:  No-   chest pain, orthopnea, PND, swelling in lower extremities, anasarca,   dizziness, palpitations Resp:+shortness of breath with exertion or at rest.              + productive cough,  + non-productive cough,  No-  coughing up of blood.              No-   change in color of mucus.  No- wheezing.   Skin: +  rash or lesions. GI:  No-   heartburn, indigestion, abdominal pain, nausea, vomiting,   GU:  MS:  No-   joint pain or swelling.  No- decreased range of motion.  No- back pain. Neuro- nothing unusual Psych:  No- change in mood or affect. No depression or anxiety.  No memory loss.  Objective:   Physical Exam General- Alert, Oriented, Affect-appropriate, Distress- none acute, but looks thin and tired  Skin- + multiple small linear plaques along flexure lines left side of neck, down onto upper chest, . in the right posterior axillary line and extensor surfaces of forearms and elbows she has stable macular lesions which she says itch and are tender. Lymphadenopathy- none Head- atraumatic            Eyes- Gross vision intact, PERRLA, conjunctivae clear secretions            Ears- Hearing, canals normal            Nose- Clear, No-Septal dev, mucus, polyps, erosion, perforation             Throat- Mallampati IV , mucosa clear , drainage- none, tonsils- atrophic Neck- flexible , trachea midline, no stridor , thyroid nl, carotid no bruit Chest - symmetrical excursion , unlabored           Heart/CV- RRR ,  no murmur , no gallop  , no rub, nl s1 s2                           - JVD- none , edema- none, stasis changes- none, varices- none           Lung- clear, wheeze- none, cough + dry , dullness-none, rub- none                Chest wall-  Abd- no enlargement liver or spleen Br/ Gen/ Rectal- Not done, not indicated Extrem- cyanosis- none, clubbing, none, atrophy- none, strength- nl Neuro- grossly intact to observation Assessment & Plan:

## 2015-02-19 NOTE — Telephone Encounter (Signed)
LM for patient x 1 to return call - Will have to work with Joellen Jersey on availability of CY schedule.

## 2015-02-20 LAB — ANGIOTENSIN CONVERTING ENZYME: Angiotensin-Converting Enzyme: 97 U/L — ABNORMAL HIGH (ref 8–52)

## 2015-02-20 NOTE — Assessment & Plan Note (Signed)
Current symptoms with last ACE being significantly elevated favor activation of sarcoid. Watch for alternative explanations. Plan-labs, chest x-ray, PFT. Prednisone 20 mg daily, then dropped to 10 mg daily then stop, watching for symptomatic response

## 2015-02-23 NOTE — Telephone Encounter (Signed)
Pt can be scheduled at [facility] for PFT as CY can read from there and patient can be worked in [place] same day as PFT scheduled. Thanks.    Also, please let patient know of her CXR and lab results as well.

## 2015-02-23 NOTE — Telephone Encounter (Signed)
Called and left message for patient to call back to schedule appointments. Lauren Massey - please advise when patient can come back to schedule OV and PFT same day, patient refuses to do separate days.

## 2015-02-23 NOTE — Telephone Encounter (Signed)
LMTCB

## 2015-02-24 NOTE — Telephone Encounter (Signed)
lmtcb for pt.  

## 2015-02-24 NOTE — Telephone Encounter (Signed)
Pt returning call and can be reached @ [phone].Lauren Massey

## 2015-02-24 NOTE — Telephone Encounter (Signed)
atc X2, line went to fast busy signal.  Wcb.

## 2015-02-24 NOTE — Telephone Encounter (Signed)
Patient Returned call [phone]

## 2015-02-25 NOTE — Telephone Encounter (Signed)
LMTCB for pt 

## 2015-02-25 NOTE — Telephone Encounter (Signed)
Spoke with pt and advised that we can schedule pt with CY and do PFT at [facility] on same day.  Pt is ok with this. Order has already been placed for PFT just needs to be scheduled at [facility] on 04/17/15 prior to pt's appt with Dr Annamaria Boots that day at 11:15.  Please inform pt of appt time with Dr young when you call with PFT appt time.

## 2015-02-25 NOTE — Telephone Encounter (Signed)
Pfts@[facility]  04/17/15@9am  lmtcb

## 2015-02-25 NOTE — Telephone Encounter (Signed)
Spoke to pt she is aware the pfts are scheduled at [facility] 04/17/15@9am  and appt with dr young @11 :15am Lauren Massey

## 2015-04-16 ENCOUNTER — Ambulatory Visit (INDEPENDENT_AMBULATORY_CARE_PROVIDER_SITE_OTHER): Payer: BLUE CROSS/BLUE SHIELD | Admitting: Family Medicine

## 2015-04-16 VITALS — BP 144/84 | HR 86 | Temp 100.3°F | Resp 16 | Ht 63.0 in | Wt 141.0 lb

## 2015-04-16 DIAGNOSIS — J111 Influenza due to unidentified influenza virus with other respiratory manifestations: Secondary | ICD-10-CM | POA: Diagnosis not present

## 2015-04-16 DIAGNOSIS — H109 Unspecified conjunctivitis: Secondary | ICD-10-CM | POA: Diagnosis not present

## 2015-04-16 MED ORDER — PREDNISOLONE ACETATE 1 % OP SUSP: 1.0000 [drp] | Freq: Two times a day (BID) | OPHTHALMIC | Status: DC

## 2015-04-16 MED ORDER — HYDROCOD POLST-CPM POLST ER 10-8 MG/5ML PO SUER: 5.0000 mL | Freq: Two times a day (BID) | ORAL | Status: DC | PRN

## 2015-04-16 MED ORDER — OSELTAMIVIR PHOSPHATE 75 MG PO CAPS: 75.0000 mg | ORAL_CAPSULE | Freq: Two times a day (BID) | ORAL | Status: DC

## 2015-04-16 NOTE — Progress Notes (Signed)
This is 60 year old married woman who works in a [employer]. She developed acute rapid onset of myalgia, headache, sore throat, fever, and headache 2 days ago.  She's had no nausea or vomiting but has lost her appetite. She has no rash and she has a nonproductive cough. She has no abdominal pain.  Objective:BP 144/84 mmHg  Pulse 86  Temp(Src) 100.3 F (37.9 C) (Oral)  Resp 16  Ht 5\' 3"  (1.6 m)  Wt 141 lb (63.957 kg)  BMI 24.98 kg/m2  SpO2 95% HEENT: Unremarkable with the exception of rhinorrhea and injected conjunctiva bilaterally.  Note: Patient has had this before in conjunction with her sarcoidosis Chest: Clear Heart: Regular no murmur Neck: Supple no adenopathy Skin: No rash  Assessment: Given the patient was given flu shot this year, I still think she has the flu. We've seen many people with the symptoms that received the flu shot this year. She's not having any respiratory distress but think she should stay at work until Monday.    ICD-9-CM ICD-10-CM   1. Influenza with respiratory manifestation 487.1 J11.1 oseltamivir (TAMIFLU) 75 MG capsule     chlorpheniramine-HYDROcodone (TUSSIONEX PENNKINETIC ER) 10-8 MG/5ML SUER  2. Bilateral conjunctivitis 372.30 H10.9 prednisoLONE acetate (PRED FORTE) 1 % ophthalmic suspension     Signed, Robyn Haber, MD

## 2015-04-16 NOTE — Patient Instructions (Addendum)
Influenza, Adult Influenza ("the flu") is a viral infection of the respiratory tract. It occurs more often in winter months because people spend more time in close contact with one another. Influenza can make you feel very sick. Influenza easily spreads from person to person (contagious). CAUSES  Influenza is caused by a virus that infects the respiratory tract. You can catch the virus by breathing in droplets from an infected person's cough or sneeze. You can also catch the virus by touching something that was recently contaminated with the virus and then touching your mouth, nose, or eyes. RISKS AND COMPLICATIONS You may be at risk for a more severe case of influenza if you smoke cigarettes, have diabetes, have chronic heart disease (such as heart failure) or lung disease (such as asthma), or if you have a weakened immune system. Elderly people and pregnant women are also at risk for more serious infections. The most common problem of influenza is a lung infection (pneumonia). Sometimes, this problem can require emergency medical care and may be life threatening. SIGNS AND SYMPTOMS  Symptoms typically last 4 to 10 days and may include:  Fever.  Chills.  Headache, body aches, and muscle aches.  Sore throat.  Chest discomfort and cough.  Poor appetite.  Weakness or feeling tired.  Dizziness.  Nausea or vomiting. DIAGNOSIS  Diagnosis of influenza is often made based on your history and a physical exam. A nose or throat swab test can be done to confirm the diagnosis. TREATMENT  In mild cases, influenza goes away on its own. Treatment is directed at relieving symptoms. For more severe cases, your health care provider may prescribe antiviral medicines to shorten the sickness. Antibiotic medicines are not effective because the infection is caused by a virus, not by bacteria. HOME CARE INSTRUCTIONS  Take medicines only as directed by your health care provider.  Use a cool mist  humidifier to make breathing easier.  Get plenty of rest until your temperature returns to normal. This usually takes 3 to 4 days.  Drink enough fluid to keep your urine clear or pale yellow.  Cover yourmouth and nosewhen coughing or sneezing,and wash your handswellto prevent thevirusfrom spreading.  Stay homefromwork orschool untilthe fever is gonefor at least 30full day. PREVENTION  An annual influenza vaccination (flu shot) is the best way to avoid getting influenza. An annual flu shot is now routinely recommended for all adults in the [place] IF:  You experiencechest pain, yourcough worsens,or you producemore mucus.  Youhave nausea,vomiting, ordiarrhea.  Your fever returns or gets worse. SEEK IMMEDIATE MEDICAL CARE IF:  You havetrouble breathing, you become short of breath,or your skin ornails becomebluish.  You have severe painor stiffnessin the neck.  You develop a sudden headache, or pain in the face or ear.  You have nausea or vomiting that you cannot control. MAKE SURE YOU:   Understand these instructions.  Will watch your condition.  Will get help right away if you are not doing well or get worse.   This information is not intended to replace advice given to you by your health care provider. Make sure you discuss any questions you have with your health care provider.   Document Released: 01/08/2000 Document Revised: 01/31/2014 Document Reviewed: 04/11/2011 Elsevier Interactive Patient Education 2016 [employer].    IF you received an x-ray today, you will receive an invoice from [facility]. Please contact [facility] at [phone] with questions or concerns regarding your invoice.   IF you received  labwork today, you will receive an invoice from [employer]. Please contact [facility] at [phone] with questions or concerns regarding your invoice.   Our billing staff will  not be able to assist you with questions regarding bills from these companies.  You will be contacted with the lab results as soon as they are available. The fastest way to get your results is to activate your My Chart account. Instructions are located on the last page of this paperwork. If you have not heard from [place] regarding the results in 2 weeks, please contact this office.

## 2015-04-17 ENCOUNTER — Encounter (HOSPITAL_COMMUNITY): Payer: BLUE CROSS/BLUE SHIELD

## 2015-04-17 ENCOUNTER — Ambulatory Visit: Payer: BLUE CROSS/BLUE SHIELD | Admitting: Internal Medicine

## 2015-04-20 ENCOUNTER — Telehealth: Payer: Self-pay

## 2015-04-20 NOTE — Telephone Encounter (Signed)
Pt states she was given an oow note until today, however she still isn't much better and would like to have an extension until Wednesday. Please call [phone] and she will come pick up

## 2015-04-21 NOTE — Telephone Encounter (Signed)
Pt called again this morning checking on the status of her oow note. Please call [phone] and she will pick up

## 2015-04-21 NOTE — Telephone Encounter (Signed)
Pt's husband came into 45 to p/up letter and stated that there is no way pt is well enough yet to RTW tomorrow. Asked for letter to put pt back to work on Friday 3/31. I OKd this with Collet[place] and re-wrote letter. Given to husband.

## 2015-04-21 NOTE — Telephone Encounter (Signed)
LM note ready to pick up.

## 2015-04-24 ENCOUNTER — Emergency Department (HOSPITAL_COMMUNITY): Payer: BLUE CROSS/BLUE SHIELD

## 2015-04-24 ENCOUNTER — Encounter (HOSPITAL_COMMUNITY): Payer: Self-pay | Admitting: Emergency Medicine

## 2015-04-24 ENCOUNTER — Emergency Department (HOSPITAL_COMMUNITY)
Admission: EM | Admit: 2015-04-24 | Discharge: 2015-04-24 | Disposition: A | Discharge status: Home / Self Care | Payer: BLUE CROSS/BLUE SHIELD | Attending: Emergency Medicine | Admitting: Emergency Medicine

## 2015-04-24 DIAGNOSIS — Z79899 Other long term (current) drug therapy: Secondary | ICD-10-CM | POA: Diagnosis not present

## 2015-04-24 DIAGNOSIS — J069 Acute upper respiratory infection, unspecified: Secondary | ICD-10-CM | POA: Insufficient documentation

## 2015-04-24 DIAGNOSIS — Z862 Personal history of diseases of the blood and blood-forming organs and certain disorders involving the immune mechanism: Secondary | ICD-10-CM | POA: Insufficient documentation

## 2015-04-24 DIAGNOSIS — R05 Cough: Secondary | ICD-10-CM | POA: Diagnosis present

## 2015-04-24 DIAGNOSIS — N390 Urinary tract infection, site not specified: Secondary | ICD-10-CM | POA: Diagnosis not present

## 2015-04-24 DIAGNOSIS — Z8669 Personal history of other diseases of the nervous system and sense organs: Secondary | ICD-10-CM | POA: Insufficient documentation

## 2015-04-24 LAB — URINALYSIS, ROUTINE W REFLEX MICROSCOPIC
BILIRUBIN URINE: NEGATIVE
Glucose, UA: NEGATIVE mg/dL
Ketones, ur: NEGATIVE mg/dL
NITRITE: NEGATIVE
Protein, ur: 30 mg/dL — AB
SPECIFIC GRAVITY, URINE: 1.016 (ref 1.005–1.030)
pH: 6.5 (ref 5.0–8.0)

## 2015-04-24 LAB — URINE MICROSCOPIC-ADD ON

## 2015-04-24 MED ORDER — CEPHALEXIN 500 MG PO CAPS: 500.0000 mg | ORAL_CAPSULE | Freq: Two times a day (BID) | ORAL | Status: DC

## 2015-04-24 MED ORDER — BENZONATATE 100 MG PO CAPS: 100.0000 mg | ORAL_CAPSULE | Freq: Three times a day (TID) | ORAL | Status: DC

## 2015-04-24 NOTE — ED Notes (Signed)
Per pt, states cold symptoms for a week-was treated at [facility] and given scripts-states she is not getting better-still having cough

## 2015-04-24 NOTE — Discharge Instructions (Signed)
Take the prescribed medication as directed. Make sure that you're drinking fluids to stay hydrated. If you wish to supplement with an ensure or two daily until you are eating more regularly that would be ok,. Follow-up with your primary care physician. Return to the ED for new or worsening symptoms.

## 2015-04-24 NOTE — ED Provider Notes (Signed)
CSN: [id number]     Arrival date & time 04/24/15  [id number] History   First MD Initiated Contact with Patient 04/24/15 1010     Chief Complaint  Patient presents with  . URI     (Consider location/radiation/quality/duration/timing/severity/associated sxs/prior Treatment) Patient is a 60 y.o. female presenting with URI. The history is provided by the patient and medical records.  URI Presenting symptoms: cough and sore throat     60 year old female with history of sarcoidosis, presenting to the ED for URI type symptoms. Patient states initially began getting sick on 04/16/2015. She was seen at urgent care and diagnosed with the flu. She was started on Tamiflu and Tussionex which she took without relief.  She states she continues to have cough that is mostly productive at night which keeps her awake.  She denies chest pain or SOB.  She also has a mild sore throat but has been able to drink fluids without difficulty.  She tried to return to work today but was sent home by her supervisor.  Patient also has expressed concern for UTI.  She states her urine has had a foul odor recently.  Denies dysuria, hematuria, abdominal pain, flank pain, or vaginal discharge.  No fever, chills, sweats.  VSS.  Past Medical History  Diagnosis Date  . Unspecified iridocyclitis   . Sarcoidosis ([place])     skin, eye, lung   Past Surgical History  Procedure Laterality Date  . Bronchoscopy  2003  . Vesicovaginal fistula closure w/ tah    . Appendectomy    . Abdominal hysterectomy    . Cataract extraction     Family History  Problem Relation Age of Onset  . Diabetes    . Cancer Father   . Heart disease Father    Social History  Substance Use Topics  . Smoking status: Never Smoker   . Smokeless tobacco: None  . Alcohol Use: No   OB History    No data available     Review of Systems  HENT: Positive for sore throat.   Respiratory: Positive for cough.   Genitourinary:       Foul smelling urine  All other  systems reviewed and are negative.     Allergies  Review of patient's allergies indicates no known allergies.  Home Medications   Prior to Admission medications   Medication Sig Start Date End Date Taking? Authorizing Provider  chlorpheniramine-HYDROcodone (TUSSIONEX PENNKINETIC ER) 10-8 MG/5ML SUER Take 5 mLs by mouth every 12 (twelve) hours as needed. 04/16/15   Robyn Haber, MD  oseltamivir (TAMIFLU) 75 MG capsule Take 1 capsule (75 mg total) by mouth 2 (two) times daily. 04/16/15   Robyn Haber, MD  prednisoLONE acetate (PRED FORTE) 1 % ophthalmic suspension Place 1 drop into the right eye 2 (two) times daily. Reported on 04/16/2015 04/16/15   Robyn Haber, MD   BP 147/87 mmHg  Pulse 106  Temp(Src) 99 F (37.2 C) (Oral)  Resp 18  SpO2 100%   Physical Exam  Constitutional: She is oriented to person, place, and time. She appears well-developed and well-nourished. No distress.  HENT:  Head: Normocephalic and atraumatic.  Right Ear: Tympanic membrane and ear canal normal.  Left Ear: Tympanic membrane and ear canal normal.  Nose: Nose normal.  Mouth/Throat: Uvula is midline, oropharynx is clear and moist and mucous membranes are normal. No oropharyngeal exudate, posterior oropharyngeal edema, posterior oropharyngeal erythema or tonsillar abscesses.  Tonsils overall normal in appearance bilaterally without exudate; uvula midline  without evidence of peritonsillar abscess; handling secretions appropriately; no difficulty swallowing or speaking; normal phonation without stridor Moist mucous membranes  Eyes: Conjunctivae and EOM are normal. Pupils are equal, round, and reactive to light.  Neck: Normal range of motion. Neck supple.  Cardiovascular: Normal rate, regular rhythm and normal heart sounds.   Pulmonary/Chest: Effort normal and breath sounds normal. No respiratory distress. She has no wheezes. She has no rhonchi. She has no rales.  Dry cough noted, no distress, no wheezes  or rhonchi noted  Abdominal: Soft. Bowel sounds are normal. There is no tenderness. There is no guarding.  Musculoskeletal: Normal range of motion.  Neurological: She is alert and oriented to person, place, and time.  Skin: Skin is warm and dry. No rash noted. She is not diaphoretic.  Psychiatric: She has a normal mood and affect.  Nursing note and vitals reviewed.   ED Course  Procedures (including critical care time) Labs Review Labs Reviewed  URINALYSIS, ROUTINE W REFLEX MICROSCOPIC (NOT AT [facility]) - Abnormal; Notable for the following:    Color, Urine AMBER (*)    APPearance CLOUDY (*)    Hgb urine dipstick TRACE (*)    Protein, ur 30 (*)    Leukocytes, UA MODERATE (*)    All other components within normal limits  URINE MICROSCOPIC-ADD ON - Abnormal; Notable for the following:    Squamous Epithelial / LPF 0-5 (*)    Bacteria, UA FEW (*)    All other components within normal limits  URINE CULTURE    Imaging Review Dg Chest 2 View  04/24/2015  CLINICAL DATA:  Cough, congestion shortness of breath for 1 week. History of sarcoidosis. Initial encounter. EXAM: CHEST  2 VIEW COMPARISON:  PA and lateral chest 02/19/2015 and 02/17/2014. FINDINGS: Extensive bilateral pulmonary scarring and fibrosis are worst in the bases. Lung volumes slightly lower than on the most recent examination with crowding of the bronchovascular structures. No pneumothorax or pleural effusion is identified. Heart size is normal. IMPRESSION: Extensive interstitial lung disease secondary to sarcoidosis. No evidence of superimposed pneumonia is identified. Electronically Signed   By: Inge Rise M.D.   On: 04/24/2015 11:02   I have personally reviewed and evaluated these images and lab results as part of my medical decision-making.   EKG Interpretation None      MDM   Final diagnoses:  URI (upper respiratory infection)  UTI (lower urinary tract infection)   60 year old female here with URI type  symptoms. Seen in urgent care last week, treated for the flu. Reports persistent cough. Patient is afebrile, nontoxic. Her exam is overall benign. She has a dry cough noted. No signs of respiratory distress. No signs of strep throat. Given her history of sarcoidosis, chest x-ray was obtained which shows chronic changes, no acute infiltrate. Patient also had concern for UTI as her urine has been foul-smelling, she does have bacteria and leukocytes noted.  Will send for culture.  Patient is overall well appearing.  Her vital signs remained stable. She has no clinical signs of dehydration, no ketones in the urine. We'll continue to treat symptomatically for URI, start abx for UTI pending urine culture.  Encouraged to follow-up with PCP.  Discussed plan with patient, he/she acknowledged understanding and agreed with plan of care.  Return precautions given for new or worsening symptoms.  Larene Pickett, PA-C 04/24/15 [place], MD 04/27/15 [phone]

## 2015-08-03 ENCOUNTER — Other Ambulatory Visit (INDEPENDENT_AMBULATORY_CARE_PROVIDER_SITE_OTHER): Payer: BLUE CROSS/BLUE SHIELD

## 2015-08-03 ENCOUNTER — Encounter: Payer: Self-pay | Admitting: Internal Medicine

## 2015-08-03 ENCOUNTER — Ambulatory Visit (INDEPENDENT_AMBULATORY_CARE_PROVIDER_SITE_OTHER): Payer: BLUE CROSS/BLUE SHIELD | Admitting: Internal Medicine

## 2015-08-03 VITALS — BP 132/82 | HR 76 | Temp 98.3°F | Resp 16 | Ht 62.0 in | Wt 135.8 lb

## 2015-08-03 DIAGNOSIS — D869 Sarcoidosis, unspecified: Secondary | ICD-10-CM

## 2015-08-03 DIAGNOSIS — Z1159 Encounter for screening for other viral diseases: Secondary | ICD-10-CM

## 2015-08-03 DIAGNOSIS — R634 Abnormal weight loss: Secondary | ICD-10-CM | POA: Diagnosis not present

## 2015-08-03 LAB — CBC
HCT: 34.6 % — ABNORMAL LOW (ref 36.0–46.0)
HEMOGLOBIN: 11.1 g/dL — AB (ref 12.0–15.0)
MCHC: 32.2 g/dL (ref 30.0–36.0)
MCV: 79.9 fl (ref 78.0–100.0)
PLATELETS: 171 10*3/uL (ref 150.0–400.0)
RBC: 4.32 Mil/uL (ref 3.87–5.11)
RDW: 16.1 % — ABNORMAL HIGH (ref 11.5–15.5)
WBC: 4.6 10*3/uL (ref 4.0–10.5)

## 2015-08-03 LAB — COMPREHENSIVE METABOLIC PANEL
ALT: 20 U/L (ref 0–35)
AST: 28 U/L (ref 0–37)
Albumin: 3.5 g/dL (ref 3.5–5.2)
Alkaline Phosphatase: 83 U/L (ref 39–117)
BILIRUBIN TOTAL: 0.3 mg/dL (ref 0.2–1.2)
BUN: 14 mg/dL (ref 6–23)
CALCIUM: 9.9 mg/dL (ref 8.4–10.5)
CO2: 30 meq/L (ref 19–32)
Chloride: 99 mEq/L (ref 96–112)
Creatinine, Ser: 0.63 mg/dL (ref 0.40–1.20)
GFR: 123.64 mL/min (ref >60.00)
Glucose, Bld: 92 mg/dL (ref 70–99)
POTASSIUM: 3.9 meq/L (ref 3.5–5.1)
Sodium: 133 mEq/L — ABNORMAL LOW (ref 135–145)
Total Protein: 10.1 g/dL — ABNORMAL HIGH (ref 6.0–8.3)

## 2015-08-03 LAB — TSH: TSH: 1.87 u[IU]/mL (ref 0.35–4.50)

## 2015-08-03 LAB — LIPID PANEL
CHOL/HDL RATIO: 4
CHOLESTEROL: 180 mg/dL (ref 0–200)
HDL: 42.4 mg/dL (ref >39.00)
LDL Cholesterol: 113 mg/dL — ABNORMAL HIGH (ref 0–99)
NonHDL: 137.15
TRIGLYCERIDES: 122 mg/dL (ref 0.0–149.0)
VLDL: 24.4 mg/dL (ref 0.0–40.0)

## 2015-08-03 LAB — T4, FREE: FREE T4: 0.74 ng/dL (ref 0.60–1.60)

## 2015-08-03 LAB — VITAMIN B12: VITAMIN B 12: 541 pg/mL (ref 211–911)

## 2015-08-03 MED ORDER — DICLOFENAC SODIUM 75 MG PO TBEC: 75.0000 mg | DELAYED_RELEASE_TABLET | Freq: Two times a day (BID) | ORAL | Status: DC

## 2015-08-03 MED ORDER — ALBUTEROL SULFATE HFA 108 (90 BASE) MCG/ACT IN AERS: 2.0000 | INHALATION_SPRAY | Freq: Four times a day (QID) | RESPIRATORY_TRACT | Status: DC | PRN

## 2015-08-03 NOTE — Progress Notes (Signed)
Pre visit review using our clinic review tool, if applicable. No additional management support is needed unless otherwise documented below in the visit note. 

## 2015-08-03 NOTE — Progress Notes (Signed)
   Subjective:    Patient ID: Lauren Massey, female    DOB: 06-17-1955, 60 y.o.   MRN: [id number]  HPI The patient is a 60 YO female coming in new for several problems including weight loss and lower back pain. She does have sarcoid and was treated earlier this year for flare with pulmonary. She did not return for follow up with them. She is still coughing a lot and having problems with endurance and breathing. She gets into coughing fits and cannot breathe well. She is having pain in the low back on the sides for several weeks now and worse with coughing. No loss of bowel or bladder. No pain down into her legs.  Next concern is her weight loss. She is having some mild night sweats and not able to eat as well. She denies trying to lose weight. She denies having colonoscopy since turning 64 and admits to having one very early in life for constipation.   PMH, [facility], social history reviewed and updated.   Review of Systems  Constitutional: Positive for activity change, appetite change, fatigue and unexpected weight change. Negative for fever and chills.  HENT: Positive for congestion. Negative for ear discharge, ear pain, nosebleeds, postnasal drip, rhinorrhea, sore throat and trouble swallowing.   Eyes: Negative.   Respiratory: Positive for cough and shortness of breath. Negative for choking, chest tightness and wheezing.   Cardiovascular: Negative for chest pain, palpitations and leg swelling.  Gastrointestinal: Negative for nausea, abdominal pain, diarrhea, constipation and abdominal distention.  Musculoskeletal: Negative.   Skin: Negative.   Neurological: Negative.   Psychiatric/Behavioral: Negative.       Objective:   Physical Exam  Constitutional: She is oriented to person, place, and time. She appears well-developed and well-nourished.  HENT:  Head: Normocephalic and atraumatic.  Eyes: EOM are normal.  Neck: Normal range of motion.  Cardiovascular: Normal rate and regular rhythm.     Pulmonary/Chest: Effort normal. No respiratory distress. She has no wheezes. She has no rales.  Abdominal: Soft. Bowel sounds are normal. She exhibits no distension and no mass. There is no tenderness. There is no rebound.  Musculoskeletal: She exhibits no edema.  Neurological: She is alert and oriented to person, place, and time.  Skin: Skin is warm and dry.  Psychiatric: She has a normal mood and affect.   Filed Vitals:   08/03/15 1408  BP: 132/82  Pulse: 76  Temp: 98.3 F (36.8 C)  TempSrc: Oral  Resp: 16  Height: 5\' 2"  (1.575 m)  Weight: 135 lb 12.8 oz (61.598 kg)  SpO2: 94%      Assessment & Plan:

## 2015-08-03 NOTE — Patient Instructions (Addendum)
We are checking the labs today to see if there is a cause for the weight change. Even the lung disease can cause weight loss sometimes.   We have sent in voltaren pills for the back. You can take them up to twice a day for pain. If they are not helping call the office back.  You need to go back to the lung doctor to take care of the sarcoidosis as this is a serious lung problem and you are not doing well with it.   Sarcoidosis Sarcoidosis is a disease that causes inflammation in your organs and other areas of your body. The lungs are most often affected (pulmonary sarcoidosis). Sarcoidosis can also affect your lymph nodes, liver, eyes, skin, or any other body tissue. When you have sarcoidosis, small clumps of tissue (granulomas) form in the affected area of your body. Granulomas are made up of your body's defense (immune) cells. Inflammation results when your body reacts to a harmful substance. Normally, inflammation goes away after immune cells get rid of the harmful substance. In sarcoidosis, the immune cells form granulomas instead. CAUSES  The exact cause of sarcoidosis is not known. Something triggers the immune system to respond, such as dust, chemicals, bacteria, or a virus.  RISK FACTORS You may be at a greater risk for sarcoidosis if you:   Have a family history of the disease.  Are African American.  Are of Northern European ancestry.  Are 57-74 years old.  Are female. SIGNS AND SYMPTOMS  Many people with sarcoidosis have no symptoms. Others have very mild symptoms. Sarcoidosis most often affects the lungs. Symptoms include:  Chest pain.  Coughing.  Wheezing.  Shortness of breath. Other common symptoms include:   Night sweats.  Weight loss.  Fatigue.  Depression.  A sense of uneasiness. DIAGNOSIS  Sarcoidosis may be diagnosed by:   Medical history and physical exam.  Chest X-ray. This looks for granulomas in your lungs.  Lung function tests. These measure  your breathing and look for problems related to sarcoidosis.  Examining a sample of tissue under a microscope (biopsy). TREATMENT  Sarcoidosis usually clears up without treatment. You may take medicines to reduce inflammation or relieve symptoms. These may include:  Prednisone. This steroid reduces inflammation related to sarcoidosis.  Chloroquine or hydroxychloroquine. These are antimalarial medicines used to treat sarcoidosis that affects the skin or brain.  Methotrexate, leflunomide, or azathioprine. These medicines affect the immune system and can help with sarcoidosis in the joints, eyes, skin, or lungs.  Inhalers. Inhaled medicines can help you breathe if sarcoidosis is affecting your lungs. HOME CARE INSTRUCTIONS  Do not use any tobacco products, including cigarettes, chewing tobacco, or electronic cigarettes. If you need help quitting, ask your health care provider.  Avoid secondhand smoke.  Avoid irritating dust and chemicals. Stay indoors on days when air quality is poor in your area.  Take medicines only as directed by your health care provider. SEEK MEDICAL CARE IF:  You have vision problems.  You have shortness of breath.  You have a dry, persistent cough.  You have an irregular heartbeat.  You have pain or ache in your joints, hands, or feet.  You have an unexplained rash. SEEK IMMEDIATE MEDICAL CARE IF:  You have chest pain.   This information is not intended to replace advice given to you by your health care provider. Make sure you discuss any questions you have with your health care provider.   Document Released: 11/11/2003 Document Revised: 01/31/2014 Document  Reviewed: 05/08/2013 Elsevier Interactive Patient Education [employer].

## 2015-08-04 LAB — HEPATITIS C ANTIBODY: HCV Ab: NEGATIVE

## 2015-08-04 NOTE — Assessment & Plan Note (Signed)
Needs to return to pulmonology and talked to her about the seriousness of her condition. She is having a lot of problems still with endurance and coughing. Rx for albuterol inhaler today and needs to call pulmonary will likely need prednisone.

## 2015-08-04 NOTE — Assessment & Plan Note (Signed)
Referral to GI for colonoscopy although they may want to wait until sarcoid flare is past. Talked to her about the fact that pulmonary problems can cause excessive calories to be burned and that she is not eating well. Will see her back in 3 months for monitoring. Mammogram and pap smear up to date. Checking labs today for metabolic cause of the weight loss.

## 2015-08-06 ENCOUNTER — Encounter: Payer: Self-pay | Admitting: Internal Medicine

## 2015-08-06 ENCOUNTER — Ambulatory Visit (INDEPENDENT_AMBULATORY_CARE_PROVIDER_SITE_OTHER): Payer: BLUE CROSS/BLUE SHIELD | Admitting: Internal Medicine

## 2015-08-06 VITALS — BP 120/76 | HR 79 | Ht 62.0 in | Wt 136.0 lb

## 2015-08-06 DIAGNOSIS — R634 Abnormal weight loss: Secondary | ICD-10-CM

## 2015-08-06 DIAGNOSIS — D869 Sarcoidosis, unspecified: Secondary | ICD-10-CM | POA: Diagnosis not present

## 2015-08-06 MED ORDER — PREDNISONE 10 MG PO TABS: ORAL_TABLET | ORAL | Status: DC

## 2015-08-06 NOTE — Progress Notes (Signed)
Patient ID: Lauren Massey, female    DOB: 1955-08-23, 60 y.o.   MRN: [id number]  HPI  02/19/2015-60 year old female never smoker followed for Sarcoid III involving eyes/iritis, lungs and skin-bronchoscopy 2000. Failed Plaquenil for skin rash. CXR 08/19/2014-stable chronic patchy scarring bilateral lung bases and right upper lobe, no active cardiopulmonary disease. ACE 08/18/2014- > 100, BMET WNL,  FOLLOWS FOR: Pt states she has noticed SOB-"gives out" since last visit. 8 pound weight loss in the last 6 months. Increased cough productive clear mucus. Occasional night sweats. Increased rash around her neck. Denies adenopathy, chest pain, blood.  08/06/2015-60 year old female never smoker followed for sarcoid III involving eyes/iritis, lungs, skin-bronchoscopy 2000 Failed Plaquenil for skin rash CXR 04/24/2015  IMPRESSION:Extensive interstitial lung disease secondary to sarcoidosis. No evidence of superimposed pneumonia is identified.Electronically Signed  By: Inge Rise M.D.  On: 04/24/2015 11:02 CMET 08/03/15- normal BUN, creatinine, LFT ACE 02/19/15- elevated 97, down from >100 07/2014 FOLLOWS FOR:Pt states she is having SOB and was given albuterol HFA to use and coughed so hard she pulled muscle in her back-was given rx for that as well. Seen by PCP Dr. Sharlet Salina for increased cough with weight loss. Given albuterol inhaler and referred to [place]. Had a flulike illness in March with increased shortness of breath and cough since then. Persistent cough with white sputum. Tired and not sleeping well. Has lost some weight. Albuterol inhaler does help. Rare night sweats, no rash. Office Spirometry 08/06/2015-moderate restriction. FVC 1.53/63%, FEV1 1.19/62%, FEV1/FVC 0.78  Review of Systems- see HPI Constitutional:  + weight loss, +night sweats, fevers, chills, fatigue, lassitude. HEENT:   No-  headaches, difficulty swallowing, tooth/dental problems, sore throat,       No-  sneezing,  itching, ear ache, nasal congestion, post nasal drip,  CV:  No-   chest pain, orthopnea, PND, swelling in lower extremities, anasarca,   dizziness, palpitations Resp:+shortness of breath with exertion or at rest.              + productive cough,  + non-productive cough,  No-  coughing up of blood.              No-   change in color of mucus.  No- wheezing.   Skin: +  rash or lesions. GI:  No-   heartburn, indigestion, abdominal pain, nausea, vomiting,  GU:  MS:  No-   joint pain or swelling.  No- decreased range of motion.  No- back pain. Neuro- nothing unusual Psych:  No- change in mood or affect. No depression or anxiety.  No memory loss.  Objective:   Physical Exam General- Alert, Oriented, Affect-appropriate, Distress- none acute,  Skin- + multiple small linear plaques along flexure lines left side of neck, down onto upper chest, . in the right posterior axillary line and extensor surfaces of forearms and elbows she has stable macular lesions which she says itch and are tender. Lymphadenopathy- none Head- atraumatic            Eyes- Gross vision intact, PERRLA, conjunctivae clear secretions            Ears- Hearing, canals normal            Nose- Clear, No-Septal dev, mucus, polyps, erosion, perforation             Throat- Mallampati IV , mucosa clear , drainage- none, tonsils- atrophic Neck- flexible , trachea midline, no stridor , thyroid nl, carotid no bruit Chest - symmetrical excursion , unlabored  Heart/CV- RRR , no murmur , no gallop  , no rub, nl s1 s2                           - JVD- none , edema- none, stasis changes- none, varices- none           Lung- clear, wheeze- none, cough + dry , dullness-none, rub- none                Chest wall-  Abd- no enlargement liver or spleen Br/ Gen/ Rectal- Not done, not indicated Extrem- cyanosis- none, clubbing, none, atrophy- none, strength- nl Neuro- grossly intact to observation Assessment & Plan:

## 2015-08-06 NOTE — Patient Instructions (Signed)
Order- office spirometry    Dx Sarcoid  Script sent for prednisone 10 mg   4 X 2 DAYS, 3 X 2 DAYS, 2 X 2 DAYS, then one daily x 1 week, then 1/2 every day x 1 week, then stop   Please call as needed

## 2015-08-07 NOTE — Assessment & Plan Note (Signed)
Plan-prednisone taper with discussion. Not clear if current symptoms are just a slowly resolving viral bronchitis Plan-prednisone taper

## 2015-08-07 NOTE — Assessment & Plan Note (Signed)
Prednisone taper addressing her bronchitis/sarcoid may help reduce cough. We discussed side effects.

## 2015-11-03 ENCOUNTER — Ambulatory Visit: Payer: BLUE CROSS/BLUE SHIELD | Admitting: Internal Medicine

## 2015-11-09 ENCOUNTER — Ambulatory Visit (INDEPENDENT_AMBULATORY_CARE_PROVIDER_SITE_OTHER): Payer: BLUE CROSS/BLUE SHIELD | Admitting: Internal Medicine

## 2015-11-09 ENCOUNTER — Encounter: Payer: Self-pay | Admitting: Internal Medicine

## 2015-11-09 DIAGNOSIS — Z23 Encounter for immunization: Secondary | ICD-10-CM | POA: Diagnosis not present

## 2015-11-09 DIAGNOSIS — D869 Sarcoidosis, unspecified: Secondary | ICD-10-CM | POA: Diagnosis not present

## 2015-11-09 DIAGNOSIS — H209 Unspecified iridocyclitis: Secondary | ICD-10-CM

## 2015-11-09 NOTE — Progress Notes (Signed)
Patient ID: Lauren Massey, female    DOB: [birth year], 60 y.o.   MRN: [id number]  HPI   Female never smoker followed for Sarcoid III involving eyes/iritis, lungs and skin-bronchoscopy 2000. Failed Plaquenil for skin rash. ACE 02/19/15- elevated 97, down from >100 07/2014 Office Spirometry 08/06/2015-moderate restriction. FVC 1.53/63%, FEV1 1.19/62%, FEV1/FVC 0.78    08/06/2015-60 year old female never smoker followed for sarcoid III involving eyes/iritis, lungs, skin-bronchoscopy 2000 Failed Plaquenil for skin rash CXR 04/24/2015  IMPRESSION:Extensive interstitial lung disease secondary to sarcoidosis. No evidence of superimposed pneumonia is identified.Electronically Signed  By: Inge Rise M.D.  On: 04/24/2015 11:02 CMET 08/03/15- normal BUN, creatinine, LFT ACE 02/19/15- elevated 97, down from >100 07/2014 FOLLOWS FOR:Pt states she is having SOB and was given albuterol HFA to use and coughed so hard she pulled muscle in her back-was given rx for that as well. Seen by PCP Dr. Sharlet Salina for increased cough with weight loss. Given albuterol inhaler and referred to [place]. Had a flulike illness in March with increased shortness of breath and cough since then. Persistent cough with white sputum. Tired and not sleeping well. Has lost some weight. Albuterol inhaler does help. Rare night sweats, no rash. Office Spirometry 08/06/2015-moderate restriction. FVC 1.53/63%, FEV1 1.19/62%, FEV1/FVC 0.78  11/09/2015-66year-old female never smoker followed for sarcoid III involving eyes/iritis, lungs, skin-bronchoscopy 2000 Failed Plaquenil for skin rash FOLLOWS FOR:Pt had hard time with heat-had to use inhaler few times but now doing better with cooler weather. Dry cough when outdoors in the wind. Denies chest pain, night sweats, adenopathy or rash. Used rescue inhaler and recent hot weather.  Review of Systems- see HPI Constitutional:  + weight loss, +night sweats, fevers, chills, fatigue,  lassitude. HEENT:   No-  headaches, difficulty swallowing, tooth/dental problems, sore throat,       No-  sneezing, itching, ear ache, nasal congestion, post nasal drip,  CV:  No-   chest pain, orthopnea, PND, swelling in lower extremities, anasarca,   dizziness, palpitations Resp:+shortness of breath with exertion or at rest.               productive cough,  + non-productive cough,  No-  coughing up of blood.              No-   change in color of mucus.  No- wheezing.   Skin: +  rash or lesions. GI:  No-   heartburn, indigestion, abdominal pain, nausea, vomiting,  GU:  MS:  No-   joint pain or swelling.  No- decreased range of motion.  No- back pain. Neuro- nothing unusual Psych:  No- change in mood or affect. No depression or anxiety.  No memory loss.  Objective:   Physical Exam General- Alert, Oriented, Affect-appropriate, Distress- none acute,  Skin- + multiple small linear plaques along flexure lines left side of neck, down onto upper chest, . in the right posterior axillary line and extensor surfaces of forearms and elbows she has stable macular lesions which she says itch and are tender. Lymphadenopathy- none Head- atraumatic            Eyes- Gross vision intact, PERRLA, conjunctivae clear secretions            Ears- Hearing, canals normal            Nose- Clear, No-Septal dev, mucus, polyps, erosion, perforation             Throat- Mallampati IV , mucosa clear , drainage- none, tonsils- atrophic Neck- flexible ,  trachea midline, no stridor , thyroid nl, carotid no bruit Chest - symmetrical excursion , unlabored           Heart/CV- RRR , no murmur , no gallop  , no rub, nl s1 s2                           - JVD- none , edema- none, stasis changes- none, varices- none           Lung- clear, wheeze- none, cough + dry , dullness-none, rub- none, + few squeaks and crackles                Chest wall-  Abd- no enlargement liver or spleen Br/ Gen/ Rectal- Not done, not  indicated Extrem- cyanosis- none, clubbing, none, atrophy- none, strength- nl Neuro- grossly intact to observation Assessment & Plan:

## 2015-11-09 NOTE — Patient Instructions (Signed)
Flu vax  Sample Bevespi maintenance inhaler      Inhale 2 puffs, twice daily  Ok to still use your blue Ventolin albuterol rescue inhaler    2 puffs every 4- 6 hours if needed  Please call as needed

## 2015-11-09 NOTE — Assessment & Plan Note (Signed)
Associated with sarcoid. Managed by ophthalmologist

## 2015-11-09 NOTE — Assessment & Plan Note (Signed)
Active symptoms are less obvious now. Lung function more related to chronic scarring. Plan-anticipate update chest x-ray, spirometry, ACE level at next visit

## 2015-11-24 ENCOUNTER — Encounter: Payer: Self-pay | Admitting: Internal Medicine

## 2015-11-24 ENCOUNTER — Ambulatory Visit (INDEPENDENT_AMBULATORY_CARE_PROVIDER_SITE_OTHER): Payer: BLUE CROSS/BLUE SHIELD | Admitting: Internal Medicine

## 2015-11-24 VITALS — BP 132/86 | HR 66 | Temp 97.6°F | Wt 135.0 lb

## 2015-11-24 DIAGNOSIS — Z1211 Encounter for screening for malignant neoplasm of colon: Secondary | ICD-10-CM

## 2015-11-24 DIAGNOSIS — R634 Abnormal weight loss: Secondary | ICD-10-CM

## 2015-11-24 NOTE — Assessment & Plan Note (Signed)
Weight is stable this visit. Labs were normal except mild low Hg. Referral to GI for colon cancer screening.

## 2015-11-24 NOTE — Patient Instructions (Signed)
We have put in the referral for the colon cancer screening.

## 2015-11-24 NOTE — Progress Notes (Signed)
Pre visit review using our clinic review tool, if applicable. No additional management support is needed unless otherwise documented below in the visit note. 

## 2015-11-24 NOTE — Progress Notes (Signed)
   Subjective:    Patient ID: Lauren Massey, female    DOB: 1955-07-30, 60 y.o.   MRN: [id number]  HPI The patient is a 60 YO female coming in for follow up of abnormal weight loss. She has been doing better with her breathing and went back to her pulmonary doctor. She is now using regular inhaler and albuterol as needed. She is stable on weight now. She has not gained any weight back. No new symptoms. No night time symptoms anymore. No fevers or chills. No new lumps or bumps.   Review of Systems  Constitutional: Negative for activity change, appetite change and unexpected weight change.  Respiratory: Positive for shortness of breath. Negative for cough, chest tightness and wheezing.   Cardiovascular: Negative.   Gastrointestinal: Negative.   Musculoskeletal: Negative.   Neurological: Negative.       Objective:   Physical Exam  Constitutional: She is oriented to person, place, and time. She appears well-developed and well-nourished.  HENT:  Head: Normocephalic and atraumatic.  Eyes: EOM are normal.  Neck: Normal range of motion.  Cardiovascular: Normal rate and regular rhythm.   Pulmonary/Chest: Effort normal. No respiratory distress. She has no wheezes.  Abdominal: Soft. She exhibits no distension. There is no tenderness. There is no rebound.  Neurological: She is alert and oriented to person, place, and time.  Skin: Skin is warm and dry.   Vitals:   11/24/15 1316  BP: 132/86  Pulse: 66  Temp: 97.6 F (36.4 C)  SpO2: 96%  Weight: 135 lb (61.2 kg)      Assessment & Plan:

## 2015-12-16 ENCOUNTER — Encounter: Payer: Self-pay | Admitting: Gastroenterology

## 2016-02-05 ENCOUNTER — Ambulatory Visit (AMBULATORY_SURGERY_CENTER): Payer: Self-pay | Admitting: *Deleted

## 2016-02-05 VITALS — Ht 62.0 in | Wt 129.2 lb

## 2016-02-05 DIAGNOSIS — Z1211 Encounter for screening for malignant neoplasm of colon: Secondary | ICD-10-CM

## 2016-02-05 MED ORDER — NA SULFATE-K SULFATE-MG SULF 17.5-3.13-1.6 GM/177ML PO SOLN: 1.0000 | Freq: Once | ORAL | 0 refills | Status: AC

## 2016-02-05 NOTE — Progress Notes (Signed)
No allergies to eggs or soy. No problems with anesthesia.  Pt given Emmi instructions for colonoscopy  No oxygen use  No diet drug use  

## 2016-02-10 ENCOUNTER — Encounter: Payer: Self-pay | Admitting: Gastroenterology

## 2016-02-19 ENCOUNTER — Encounter: Payer: Self-pay | Admitting: Gastroenterology

## 2016-02-19 ENCOUNTER — Ambulatory Visit (AMBULATORY_SURGERY_CENTER): Payer: BLUE CROSS/BLUE SHIELD | Admitting: Gastroenterology

## 2016-02-19 VITALS — BP 155/92 | HR 69 | Temp 96.6°F | Resp 20 | Ht 62.0 in | Wt 129.0 lb

## 2016-02-19 DIAGNOSIS — D129 Benign neoplasm of anus and anal canal: Secondary | ICD-10-CM

## 2016-02-19 DIAGNOSIS — D124 Benign neoplasm of descending colon: Secondary | ICD-10-CM | POA: Diagnosis not present

## 2016-02-19 DIAGNOSIS — D869 Sarcoidosis, unspecified: Secondary | ICD-10-CM | POA: Diagnosis not present

## 2016-02-19 DIAGNOSIS — D125 Benign neoplasm of sigmoid colon: Secondary | ICD-10-CM | POA: Diagnosis not present

## 2016-02-19 DIAGNOSIS — D128 Benign neoplasm of rectum: Secondary | ICD-10-CM

## 2016-02-19 DIAGNOSIS — Z1212 Encounter for screening for malignant neoplasm of rectum: Secondary | ICD-10-CM

## 2016-02-19 DIAGNOSIS — Z1211 Encounter for screening for malignant neoplasm of colon: Secondary | ICD-10-CM

## 2016-02-19 DIAGNOSIS — K635 Polyp of colon: Secondary | ICD-10-CM

## 2016-02-19 MED ORDER — SODIUM CHLORIDE 0.9 % IV SOLN: 500.0000 mL | INTRAVENOUS | Status: DC

## 2016-02-19 NOTE — Progress Notes (Signed)
Called to room to assist during endoscopic procedure.  Patient ID and intended procedure confirmed with present staff. Received instructions for my participation in the procedure from the performing physician.  

## 2016-02-19 NOTE — Op Note (Signed)
[facility] Patient Name: Lauren Massey Procedure Date: 02/19/2016 7:35 AM MRN: [id number] Endoscopist: Mauri Pole , MD Age: 61 Referring MD:  Date of Birth: 02-Sep-1955 Gender: Female Account #: [id number] Procedure:                Colonoscopy Indications:              Screening for colorectal malignant neoplasm, This                            is the patient's first colonoscopy Medicines:                Monitored Anesthesia Care Procedure:                Pre-Anesthesia Assessment:                           - Prior to the procedure, a History and Physical                            was performed, and patient medications and                            allergies were reviewed. The patient's tolerance of                            previous anesthesia was also reviewed. The risks                            and benefits of the procedure and the sedation                            options and risks were discussed with the patient.                            All questions were answered, and informed consent                            was obtained. Prior Anticoagulants: The patient has                            taken no previous anticoagulant or antiplatelet                            agents. ASA Grade Assessment: II - A patient with                            mild systemic disease. After reviewing the risks                            and benefits, the patient was deemed in                            satisfactory condition to undergo the procedure.  After obtaining informed consent, the colonoscope                            was passed under direct vision. Throughout the                            procedure, the patient's blood pressure, pulse, and                            oxygen saturations were monitored continuously. The                            Model CF-HQ190L [id number]) scope was introduced                            through the anus  and advanced to the the terminal                            ileum, with identification of the appendiceal                            orifice and IC valve. The colonoscopy was performed                            without difficulty. The patient tolerated the                            procedure well. The quality of the bowel                            preparation was excellent. The terminal ileum,                            ileocecal valve, appendiceal orifice, and rectum                            were photographed. Scope In: 8:17:33 AM Scope Out: 8:41:18 AM Scope Withdrawal Time: 0 hours 18 minutes 8 seconds  Total Procedure Duration: 0 hours 23 minutes 45 seconds  Findings:                 The perianal and digital rectal examinations were                            normal.                           Two pedunculated polyps were found in the sigmoid                            colon and descending colon. The polyps were 9 to 14                            mm in size. These polyps were removed with a hot  snare. Resection and retrieval were complete.                           Two sessile polyps were found in the rectum. The                            polyps were 1 to 2 mm in size. These polyps were                            removed with a cold biopsy forceps. Resection and                            retrieval were complete.                           Non-bleeding internal hemorrhoids were found during                            retroflexion. The hemorrhoids were small. Complications:            No immediate complications. Estimated Blood Loss:     Estimated blood loss was minimal. Impression:               - Two 9 to 14 mm polyps in the sigmoid colon and in                            the descending colon, removed with a hot snare.                            Resected and retrieved.                           - Two 1 to 2 mm polyps in the rectum, removed with                             a cold biopsy forceps. Resected and retrieved.                           - Non-bleeding internal hemorrhoids. Recommendation:           - Patient has a contact number available for                            emergencies. The signs and symptoms of potential                            delayed complications were discussed with the                            patient. Return to normal activities tomorrow.                            Written discharge instructions were provided to the  patient.                           - Resume previous diet.                           - Continue present medications.                           - Await pathology results.                           - Repeat colonoscopy in 3 years for surveillance                            based on pathology results and for surveillance of                            multiple polyps. Mauri Pole, MD 02/19/2016 8:46:22 AM This report has been signed electronically.

## 2016-02-19 NOTE — Patient Instructions (Signed)
Impression/Recommendations:  Polyp handout given to patient. Hemorrhoid handout given to patient.  Repeat colonoscopy in 3 years for surveillance based on pathology results.  YOU HAD AN ENDOSCOPIC PROCEDURE TODAY AT [facility]:   Refer to the procedure report that was given to you for any specific questions about what was found during the examination.  If the procedure report does not answer your questions, please call your gastroenterologist to clarify.  If you requested that your care partner not be given the details of your procedure findings, then the procedure report has been included in a sealed envelope for you to review at your convenience later.  YOU SHOULD EXPECT: Some feelings of bloating in the abdomen. Passage of more gas than usual.  Walking can help get rid of the air that was put into your GI tract during the procedure and reduce the bloating. If you had a lower endoscopy (such as a colonoscopy or flexible sigmoidoscopy) you may notice spotting of blood in your stool or on the toilet paper. If you underwent a bowel prep for your procedure, you may not have a normal bowel movement for a few days.  Please Note:  You might notice some irritation and congestion in your nose or some drainage.  This is from the oxygen used during your procedure.  There is no need for concern and it should clear up in a day or so.  SYMPTOMS TO REPORT IMMEDIATELY:   Following lower endoscopy (colonoscopy or flexible sigmoidoscopy):  Excessive amounts of blood in the stool  Significant tenderness or worsening of abdominal pains  Swelling of the abdomen that is new, acute  Fever of 100F or higher For urgent or emergent issues, a gastroenterologist can be reached at any hour by calling [phone].   DIET:  We do recommend a small meal at first, but then you may proceed to your regular diet.  Drink plenty of fluids but you should avoid alcoholic beverages for 24 hours.  ACTIVITY:   You should plan to take it easy for the rest of today and you should NOT DRIVE or use heavy machinery until tomorrow (because of the sedation medicines used during the test).    FOLLOW UP: Our staff will call the number listed on your records the next business day following your procedure to check on you and address any questions or concerns that you may have regarding the information given to you following your procedure. If we do not reach you, we will leave a message.  However, if you are feeling well and you are not experiencing any problems, there is no need to return our call.  We will assume that you have returned to your regular daily activities without incident.  If any biopsies were taken you will be contacted by phone or by letter within the next 1-3 weeks.  Please call us at [phone] if you have not heard about the biopsies in 3 weeks.    SIGNATURES/CONFIDENTIALITY: You and/or your care partner have signed paperwork which will be entered into your electronic medical record.  These signatures attest to the fact that that the information above on your After Visit Summary has been reviewed and is understood.  Full responsibility of the confidentiality of this discharge information lies with you and/or your care-partner.

## 2016-02-22 ENCOUNTER — Telehealth: Payer: Self-pay

## 2016-02-22 NOTE — Telephone Encounter (Signed)
  Follow up Call-  Call back number 02/19/2016  Post procedure Call Back phone  # [phone]  Permission to leave phone message Yes  Some recent data might be hidden    Patient was called for follow up after her procedure on 02/19/2016. No answer at the number given for follow up phone call. A message was left on the answering machine.

## 2016-02-22 NOTE — Telephone Encounter (Signed)
  Follow up Call-  Call back number 02/19/2016  Post procedure Call Back phone  # [phone]  Permission to leave phone message Yes  Some recent data might be hidden     Patient questions:  Do you have a fever, pain , or abdominal swelling? No. Pain Score  0 *  Have you tolerated food without any problems? Yes.    Have you been able to return to your normal activities? Yes.    Do you have any questions about your discharge instructions: Diet   No. Medications  No. Follow up visit  No.  Do you have questions or concerns about your Care? No.  Actions: * If pain score is 4 or above: No action needed, pain <4.

## 2016-02-29 ENCOUNTER — Encounter: Payer: Self-pay | Admitting: Gastroenterology

## 2016-03-14 ENCOUNTER — Ambulatory Visit (INDEPENDENT_AMBULATORY_CARE_PROVIDER_SITE_OTHER)
Admission: RE | Admit: 2016-03-14 | Discharge: 2016-03-14 | Disposition: A | Discharge status: Home / Self Care | Payer: BLUE CROSS/BLUE SHIELD | Source: Ambulatory Visit | Attending: Internal Medicine | Admitting: Internal Medicine

## 2016-03-14 ENCOUNTER — Other Ambulatory Visit (INDEPENDENT_AMBULATORY_CARE_PROVIDER_SITE_OTHER): Payer: BLUE CROSS/BLUE SHIELD

## 2016-03-14 ENCOUNTER — Encounter: Payer: Self-pay | Admitting: Internal Medicine

## 2016-03-14 ENCOUNTER — Ambulatory Visit (INDEPENDENT_AMBULATORY_CARE_PROVIDER_SITE_OTHER): Payer: BLUE CROSS/BLUE SHIELD | Admitting: Internal Medicine

## 2016-03-14 VITALS — BP 162/88 | HR 83 | Ht 62.0 in | Wt 131.8 lb

## 2016-03-14 DIAGNOSIS — D869 Sarcoidosis, unspecified: Secondary | ICD-10-CM

## 2016-03-14 DIAGNOSIS — J9621 Acute and chronic respiratory failure with hypoxia: Secondary | ICD-10-CM

## 2016-03-14 LAB — HEPATIC FUNCTION PANEL
ALBUMIN: 3.7 g/dL (ref 3.5–5.2)
ALT: 18 U/L (ref 0–35)
AST: 28 U/L (ref 0–37)
Alkaline Phosphatase: 79 U/L (ref 39–117)
Bilirubin, Direct: 0.1 mg/dL (ref 0.0–0.3)
TOTAL PROTEIN: 10.1 g/dL — AB (ref 6.0–8.3)
Total Bilirubin: 0.3 mg/dL (ref 0.2–1.2)

## 2016-03-14 LAB — CBC WITH DIFFERENTIAL/PLATELET
BASOS PCT: 1.1 % (ref 0.0–3.0)
Basophils Absolute: 0 10*3/uL (ref 0.0–0.1)
EOS PCT: 2.8 % (ref 0.0–5.0)
Eosinophils Absolute: 0.1 10*3/uL (ref 0.0–0.7)
HEMATOCRIT: 35.4 % — AB (ref 36.0–46.0)
HEMOGLOBIN: 11.6 g/dL — AB (ref 12.0–15.0)
LYMPHS PCT: 41.8 % (ref 12.0–46.0)
Lymphs Abs: 1.6 10*3/uL (ref 0.7–4.0)
MCHC: 32.8 g/dL (ref 30.0–36.0)
MCV: 81.3 fl (ref 78.0–100.0)
Monocytes Absolute: 0.3 10*3/uL (ref 0.1–1.0)
Monocytes Relative: 7.1 % (ref 3.0–12.0)
Neutro Abs: 1.8 10*3/uL (ref 1.4–7.7)
Neutrophils Relative %: 47.2 % (ref 43.0–77.0)
Platelets: 197 10*3/uL (ref 150.0–400.0)
RBC: 4.35 Mil/uL (ref 3.87–5.11)
RDW: 17.1 % — ABNORMAL HIGH (ref 11.5–15.5)
WBC: 3.8 10*3/uL — AB (ref 4.0–10.5)

## 2016-03-14 LAB — BASIC METABOLIC PANEL
BUN: 12 mg/dL (ref 6–23)
CO2: 30 meq/L (ref 19–32)
Calcium: 9.6 mg/dL (ref 8.4–10.5)
Chloride: 100 mEq/L (ref 96–112)
Creatinine, Ser: 0.65 mg/dL (ref 0.40–1.20)
GFR: 119.02 mL/min (ref >60.00)
GLUCOSE: 89 mg/dL (ref 70–99)
POTASSIUM: 3.7 meq/L (ref 3.5–5.1)
Sodium: 135 mEq/L (ref 135–145)

## 2016-03-14 MED ORDER — PREDNISONE 10 MG PO TABS: ORAL_TABLET | ORAL | 1 refills | Status: DC

## 2016-03-14 NOTE — Assessment & Plan Note (Signed)
Increased dyspnea likely reflects activity of her sarcoid. I don't see obvious heart failure signs so it is probably pulmonary. Plan-update labs-ACE level, CXR, CBC with differential, chemistries. Prednisone burst and taper down to 10 mg daily maintenance

## 2016-03-14 NOTE — Progress Notes (Signed)
Patient ID: Lauren Massey, female    DOB: 24-Aug-1955, 61 y.o.   MRN: [id number]  HPI  Female never smoker followed for Sarcoid III involving eyes/iritis, lungs and skin-bronchoscopy 2000. Failed Plaquenil for skin rash. ACE 02/19/15- elevated 97, down from >100 07/2014 Office Spirometry 08/06/2015-moderate restriction. FVC 1.53/63%, FEV1 1.19/62%, FEV1/FVC 0.78  -------------------------------------------------------------------------------------------------------  11/09/2015-41year-old female never smoker followed for sarcoid III involving eyes/iritis, lungs, skin-bronchoscopy 2000 Failed Plaquenil for skin rash FOLLOWS FOR:Pt had hard time with heat-had to use inhaler few times but now doing better with cooler weather. Dry cough when outdoors in the wind. Denies chest pain, night sweats, adenopathy or rash. Used rescue inhaler and recent hot weather.  03/15/2811-61 year old female never smoker followed for sarcoid III involving eyes/iritis, lungs, skin-bronchoscopy 2000 Failed Plaquenil for skin rash FOLLOWS FOR:Pt states she can not walk for long distances without giving out of breath as well as when talking she has to stop and catch her breath.  Room air saturation at rest on arrival today 88%, desaturating to 85% with ambulation and improving to 98% ambulating on 2 L. Gradually more aware of dyspnea especially with exertion over the last month. No acute event. Increased dry cough. Denies chest pain, palpitation, swelling, fever or sweat, adenopathy or purulent discharge. No rash.  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 We discussed issues of home oxygen and this situation.  Review of Systems- see HPI   + = pos Constitutional:  + weight loss, night sweats, fevers, chills, fatigue, lassitude. HEENT:   No-  headaches, difficulty swallowing,  tooth/dental problems, sore throat,       No-  sneezing, itching, ear ache, nasal congestion, post nasal drip,  CV:  No-   chest pain, orthopnea, PND, swelling in lower extremities, anasarca,   dizziness, palpitations Resp:+shortness of breath with exertion or at rest.               productive cough,  + non-productive cough,  No-  coughing up of blood.              No-   change in color of mucus.  No- wheezing.   Skin: +  rash or lesions. GI:  No-   heartburn, indigestion, abdominal pain, nausea, vomiting,  GU:  MS:  No-   joint pain or swelling.  No- decreased range of motion.  No- back pain. Neuro- nothing unusual Psych:  No- change in mood or affect. No depression or anxiety.  No memory loss.  Objective:   Physical Exam General- Alert, Oriented, Affect-appropriate, Distress- none acute,  Skin- + multiple small linear plaques along flexure lines left side of neck, down onto upper chest, . in the right posterior axillary line and extensor surfaces of forearms and elbows she has stable macular lesions which she says itch and are tender. Lymphadenopathy- none Head- atraumatic            Eyes- Gross vision intact, PERRLA, conjunctivae clear secretions            Ears- Hearing, canals normal            Nose- Clear, No-Septal dev, mucus, polyps, erosion, perforation             Throat- Mallampati IV , mucosa clear , drainage- none, tonsils- atrophic Neck- flexible , trachea midline, no stridor , thyroid nl, carotid no bruit Chest - symmetrical excursion ,  unlabored           Heart/CV- RRR , no murmur , no gallop  , no rub, nl s1 s2                           - JVD- none , edema- none, stasis changes- none, varices- none           Lung- clear, wheeze- none, cough + dry , dullness-none, rub- none, + few squeaks and crackles                Chest wall- + 2 cm rubbery somewhat mobile subcutaneous nodule at distal end of left clavicle Abd- no enlargement liver or spleen Br/ Gen/ Rectal- Not done,  not indicated Extrem- cyanosis- none, clubbing, none, atrophy- none, strength- nl, +1 cm rubbery nodule at base R thumb Neuro- grossly intact to observation Assessment & Plan:

## 2016-03-14 NOTE — Patient Instructions (Signed)
Order- new DME new home O2 continuous and light portable 2L/ min  Dx acute on chronic respiratory failure, sarcoid  Order lab- ACE level, CBC w diff, BMET, Hepatic function panel,            CXR-    Dx sarcoid  Script prednisone - taper as directed, then stay on 10 mg daily

## 2016-03-15 LAB — ANGIOTENSIN CONVERTING ENZYME: Angiotensin-Converting Enzyme: 71 U/L — ABNORMAL HIGH (ref 9–67)

## 2016-03-24 ENCOUNTER — Other Ambulatory Visit: Payer: Self-pay | Admitting: Internal Medicine

## 2016-03-24 DIAGNOSIS — R0600 Dyspnea, unspecified: Secondary | ICD-10-CM

## 2016-03-24 DIAGNOSIS — I509 Heart failure, unspecified: Secondary | ICD-10-CM

## 2016-03-24 NOTE — Progress Notes (Signed)
Spoke with patient and informed her an order was being placed for her to have BNP blood work drawn. Pt was instructed to get come office building, get on elevator and go to basement to have it drawn.  Pt states she will come and have it drawn tomorrow. Nothing further is needed.

## 2016-03-24 NOTE — Progress Notes (Signed)
An order was placed for BNP lab. Nothing further is needed.

## 2016-03-25 ENCOUNTER — Other Ambulatory Visit (INDEPENDENT_AMBULATORY_CARE_PROVIDER_SITE_OTHER): Payer: BLUE CROSS/BLUE SHIELD

## 2016-03-25 DIAGNOSIS — I509 Heart failure, unspecified: Secondary | ICD-10-CM | POA: Diagnosis not present

## 2016-03-25 DIAGNOSIS — R0600 Dyspnea, unspecified: Secondary | ICD-10-CM | POA: Diagnosis not present

## 2016-03-25 LAB — BRAIN NATRIURETIC PEPTIDE: Pro B Natriuretic peptide (BNP): 36 pg/mL (ref 0.0–100.0)

## 2016-05-12 ENCOUNTER — Encounter: Payer: Self-pay | Admitting: Internal Medicine

## 2016-05-12 ENCOUNTER — Ambulatory Visit (INDEPENDENT_AMBULATORY_CARE_PROVIDER_SITE_OTHER): Payer: BLUE CROSS/BLUE SHIELD | Admitting: Internal Medicine

## 2016-05-12 ENCOUNTER — Other Ambulatory Visit (INDEPENDENT_AMBULATORY_CARE_PROVIDER_SITE_OTHER): Payer: BLUE CROSS/BLUE SHIELD

## 2016-05-12 VITALS — BP 130/80 | HR 70 | Ht 62.0 in | Wt 134.2 lb

## 2016-05-12 DIAGNOSIS — I509 Heart failure, unspecified: Secondary | ICD-10-CM

## 2016-05-12 DIAGNOSIS — R002 Palpitations: Secondary | ICD-10-CM

## 2016-05-12 DIAGNOSIS — D869 Sarcoidosis, unspecified: Secondary | ICD-10-CM

## 2016-05-12 DIAGNOSIS — H209 Unspecified iridocyclitis: Secondary | ICD-10-CM

## 2016-05-12 LAB — BRAIN NATRIURETIC PEPTIDE: PRO B NATRI PEPTIDE: 26 pg/mL (ref 0.0–100.0)

## 2016-05-12 NOTE — Progress Notes (Signed)
Patient ID: Lauren Massey, female    DOB: 05/07/1955, 61 y.o.   MRN: [id number]  HPI  Female never smoker followed for Sarcoid III involving eyes/iritis, lungs and skin-bronchoscopy 2000. Failed Plaquenil for skin rash. ACE 02/19/15- elevated 97, down from >100 07/2014 Office Spirometry 08/06/2015-moderate restriction. FVC 1.53/63%, FEV1 1.19/62%, FEV1/FVC 0.78 Room air saturation at rest on arrival 03/14/16- 88%, desaturating to 85% with ambulation and improving to 98% ambulating on 2 L. -------------------------------------------------------------------------------------------------------  11/09/2015-37year-old female never smoker followed for sarcoid III involving eyes/iritis, lungs, skin-bronchoscopy 2000 Failed Plaquenil for skin rash FOLLOWS FOR:Pt had hard time with heat-had to use inhaler few times but now doing better with cooler weather. Dry cough when outdoors in the wind. Denies chest pain, night sweats, adenopathy or rash. Used rescue inhaler and recent hot weather.  03/15/2811-61 year old female never smoker followed for sarcoid III involving eyes/iritis, lungs, skin-bronchoscopy 2000, chronic respiratory failure Failed Plaquenil for skin rash FOLLOWS FOR:Pt states she can not walk for long distances without giving out of breath as well as when talking she has to stop and catch her breath.  Room air saturation at rest on arrival today 88%, desaturating to 85% with ambulation and improving to 98% ambulating on 2 L. Gradually more aware of dyspnea especially with exertion over the last month. No acute event. Increased dry cough. Denies chest pain, palpitation, swelling, fever or sweat, adenopathy or purulent discharge. No rash.  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 We discussed issues of home oxygen and this situation.  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  Notices dependent swelling of her feet. Palpitation especially as she lies down. Nodule right thumb shrinks and enlarges, nontender. Denies adenopathy, fever, sweat. Stable dry cough. Short of breath bending over or climbing stairs ACE level has drifted down from 100 in 2015, to 71 on 03/14/16, as she has been on therapy for ocular sarcoid. CXR 03/14/16 IMPRESSION: 1. Chronic interstitial disease possibly related to the patient's known sarcoid. 2. Cardiomegaly with slight increase interstitial markings noted from prior exam suggesting a component of interstitial pulmonary edema. Active pneumonitis cannot be excluded.  Review of Systems- see HPI   + = pos Constitutional:   weight loss, night sweats, fevers, chills, fatigue, lassitude. HEENT:   No-  headaches, difficulty swallowing, tooth/dental problems, sore throat,       No-  sneezing, itching, ear ache, nasal congestion, post nasal drip,  CV:  No-   chest pain, orthopnea, PND, swelling in lower extremities, anasarca,   dizziness, palpitations Resp:+shortness of breath with exertion or at rest.               productive cough,  + non-productive cough,  No-  coughing up of blood.              No-   change in color of mucus.  No- wheezing.   Skin: +  rash or lesions. GI:  No-   heartburn, indigestion, abdominal pain, nausea, vomiting,  GU:  MS:  No-   joint pain or swelling.  No- decreased range of motion.  No- back pain. Neuro- nothing unusual Psych:  No- change in  mood or affect. No depression or anxiety.  No memory loss.  Objective:   Physical Exam General- Alert, Oriented, Affect-appropriate, Distress- none acute,  Skin- + multiple small linear plaques along  flexure lines left side of neck, down onto upper chest, . in the right posterior axillary line and extensor surfaces of forearms and elbows she has stable macular lesions which she says itch and are tender. Lymphadenopathy- none Head- atraumatic            Eyes- Gross vision intact, PERRLA, conjunctivae clear secretions            Ears- Hearing, canals normal            Nose- Clear, No-Septal dev, mucus, polyps, erosion, perforation             Throat- Mallampati IV , mucosa clear , drainage- none, tonsils- atrophic Neck- flexible , trachea midline, no stridor , thyroid nl, carotid no bruit Chest - symmetrical excursion , unlabored           Heart/CV- RRR , no murmur , no gallop  , no rub, nl s1 s2                           - JVD +1 , edema- none, stasis changes- none, varices- none           Lung- clear, wheeze- none, cough + dry , dullness-none, rub- none, + few squeaks and crackles                Chest wall- + 2 cm rubbery somewhat mobile subcutaneous nodule at distal end of left clavicle Abd- no HSM Br/ Gen/ Rectal- Not done, not indicated Extrem- cyanosis- none, clubbing, none, atrophy- none, strength- nl, +1 cm rubbery nodule at base R thumb Neuro- grossly intact to observation Assessment & Plan:

## 2016-05-12 NOTE — Patient Instructions (Signed)
Order- schedule 2D echocardiogram     Dx sarcoid  Order- lab- BNP, ACE level     Dx sarcoid, CHF unspecified

## 2016-05-13 LAB — ANGIOTENSIN CONVERTING ENZYME: ANGIOTENSIN-CONVERTING ENZYME: 50 U/L (ref 9–67)

## 2016-05-13 NOTE — Assessment & Plan Note (Signed)
Palpitation with pedal edema and possibly orthostasis suggest cardiac involvement with her sarcoid or other process. Plan-blood for BNP, ACE, Echocardiogram

## 2016-05-13 NOTE — Assessment & Plan Note (Signed)
Managed by ophthalmology, currently on methotrexate and awaiting insurance approval for trial of Humira

## 2016-05-13 NOTE — Assessment & Plan Note (Signed)
Therapies directed at her ocular sarcoid should help systemic sarcoid as well. Chest x-ray has been stable without indication of progressive pulmonary disease.

## 2016-05-26 ENCOUNTER — Other Ambulatory Visit: Payer: Self-pay

## 2016-05-26 ENCOUNTER — Ambulatory Visit (HOSPITAL_COMMUNITY): Payer: BLUE CROSS/BLUE SHIELD | Attending: Cardiology

## 2016-05-26 DIAGNOSIS — I071 Rheumatic tricuspid insufficiency: Secondary | ICD-10-CM | POA: Diagnosis not present

## 2016-05-26 DIAGNOSIS — I340 Nonrheumatic mitral (valve) insufficiency: Secondary | ICD-10-CM | POA: Diagnosis not present

## 2016-05-26 DIAGNOSIS — D869 Sarcoidosis, unspecified: Secondary | ICD-10-CM | POA: Diagnosis present

## 2016-05-26 DIAGNOSIS — I371 Nonrheumatic pulmonary valve insufficiency: Secondary | ICD-10-CM | POA: Insufficient documentation

## 2016-05-26 DIAGNOSIS — I272 Pulmonary hypertension, unspecified: Secondary | ICD-10-CM | POA: Insufficient documentation

## 2016-05-27 ENCOUNTER — Telehealth: Payer: Self-pay | Admitting: Internal Medicine

## 2016-05-27 NOTE — Progress Notes (Signed)
See phone note dated 05/27/16

## 2016-05-27 NOTE — Telephone Encounter (Signed)
Notes recorded by Deneise Lever, MD on 05/27/2016 at 8:50 AM EDT Echocardiogram- normal heart function  LMTCB

## 2016-05-30 NOTE — Telephone Encounter (Signed)
Patient returning call about results.Lauren Massey

## 2016-05-30 NOTE — Telephone Encounter (Signed)
Pt aware of Echo results and voiced her understanding. Nothing further needed.

## 2016-09-12 ENCOUNTER — Ambulatory Visit (INDEPENDENT_AMBULATORY_CARE_PROVIDER_SITE_OTHER): Payer: BLUE CROSS/BLUE SHIELD | Admitting: Internal Medicine

## 2016-09-12 ENCOUNTER — Encounter: Payer: Self-pay | Admitting: Internal Medicine

## 2016-09-12 VITALS — BP 124/80 | HR 64 | Ht 62.0 in | Wt 128.8 lb

## 2016-09-12 DIAGNOSIS — H209 Unspecified iridocyclitis: Secondary | ICD-10-CM

## 2016-09-12 DIAGNOSIS — D869 Sarcoidosis, unspecified: Secondary | ICD-10-CM | POA: Diagnosis not present

## 2016-09-12 NOTE — Assessment & Plan Note (Signed)
ACE 05/12/16- 50 (9-67) reflecting therapy with methotrexate. Oxygenation looked better this visit on room air. Plan-I encouraged more consistent use of oxygen especially while sleeping. Consider repeat oximetry in the future. PFT for better understanding of lung function.

## 2016-09-12 NOTE — Patient Instructions (Addendum)
Order- schedule PFT   Dx sarcoid  Suggest you sleep with your oxygen on, to protect your heart and brain from low oxygen levels at night  Please call if we can help

## 2016-09-12 NOTE — Progress Notes (Signed)
Patient ID: Lauren Massey, female    DOB: 16-Mar-1955, 61 y.o.   MRN: [id number]  HPI  Female never smoker followed for Sarcoid III involving eyes/iritis, lungs and skin-bronchoscopy 2000. Failed Plaquenil for skin rash. ACE 02/19/15- elevated 97, down from >100 07/2014 Office Spirometry 08/06/2015-moderate restriction. FVC 1.53/63%, FEV1 1.19/62%, FEV1/FVC 0.78 Room air saturation at rest on arrival 03/14/16- 88%, desaturating to 85% with ambulation and improving to 98% ambulating on 2 L. 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  Notices dependent swelling of her feet. Palpitation especially as she lies down. Nodule right thumb shrinks and enlarges, nontender. Denies adenopathy, fever, sweat. Stable dry cough. Short of breath bending over or climbing stairs ACE level has drifted down from 100 in 2015, to 71 on 03/14/16, as she has been on therapy for ocular sarcoid. CXR 03/14/16 IMPRESSION: 1. Chronic interstitial disease possibly related to the patient's known sarcoid. 2. Cardiomegaly with slight increase interstitial markings noted from prior exam suggesting a component of interstitial pulmonary edema. Active pneumonitis cannot be excluded.  09/12/16- 61 year old female never smoker followed for sarcoid III involving eyes/iritis, lungs, skin-bronchoscopy 2000 O2 2 L / APS ACE 05/12/16- 50 (9-67) BNP 05/12/16- 26 Pt still becomes SOB mainly on exertion with a dry cough and does have some CP if she coughs too much. She works on room air, putting oxygen on for a while when she gets home and not consistently using it for sleep. Oxygen  saturation on arrival on room air recorded at 99% today. Minimal dry cough with no wheezing. Denies any acute events or major problems. Now on methotrexate from her eye doctor for sarcoid of the eye. Denies fever, adenopathy.  Review of Systems- see HPI   + = pos Constitutional:   weight loss, night sweats, fevers, chills, fatigue, lassitude. HEENT:   No-  headaches, difficulty swallowing, tooth/dental problems, sore throat,       No-  sneezing, itching, ear ache, nasal congestion, post nasal drip,  CV:  No-   chest pain, orthopnea, PND, swelling in lower extremities, anasarca,   dizziness, palpitations Resp:+shortness of breath with exertion or at rest.               productive cough,  + non-productive cough,  No-  coughing up of blood.              No-   change in color of mucus.  No- wheezing.   Skin: +  rash or lesions. GI:  No-   heartburn, indigestion, abdominal pain, nausea, vomiting,  GU:  MS:  No-   joint pain or swelling.  No- decreased range of motion.  No- back pain. Neuro- nothing unusual Psych:  No- change in mood or affect. No depression or anxiety.  No memory loss.  Objective:   Physical Exam General- Alert, Oriented, Affect-appropriate, Distress- none acute,  Skin- + multiple small linear plaques along flexure lines left side of neck, down onto upper chest, . in the right posterior axillary line and extensor surfaces of forearms and elbows she has stable macular lesions which she says itch and are tender. Lymphadenopathy- none Head- atraumatic            Eyes- Gross vision intact, PERRLA, conjunctivae clear secretions  Ears- Hearing, canals normal            Nose- Clear, No-Septal dev, mucus, polyps, erosion, perforation             Throat- Mallampati IV , mucosa clear , drainage- none, tonsils- atrophic Neck- flexible , trachea midline, no stridor , thyroid nl, carotid no bruit Chest - symmetrical excursion , unlabored           Heart/CV- RRR , no murmur , no  gallop  , no rub, nl s1 s2                           - JVD +1 , edema- none, stasis changes- none, varices- none           Lung- clear, wheeze- none, cough - none , dullness-none, rub- none, + few squeaks and crackles                Chest wall- + 2 cm rubbery somewhat mobile subcutaneous nodule at distal end of left clavicle Abd- no HSM Br/ Gen/ Rectal- Not done, not indicated Extrem- cyanosis- none, clubbing, none, atrophy- none, strength- nl, +1 cm rubbery nodule at base R thumb Neuro- grossly intact to observation Assessment & Plan:

## 2016-09-12 NOTE — Assessment & Plan Note (Signed)
She says ocular sarcoid has been stable or slightly improved. Treatment management by her ophthalmologist.

## 2017-02-02 ENCOUNTER — Ambulatory Visit (INDEPENDENT_AMBULATORY_CARE_PROVIDER_SITE_OTHER): Payer: BLUE CROSS/BLUE SHIELD | Admitting: Internal Medicine

## 2017-02-02 ENCOUNTER — Encounter: Payer: Self-pay | Admitting: Internal Medicine

## 2017-02-02 VITALS — BP 144/84 | HR 70 | Temp 97.6°F | Ht 62.0 in | Wt 133.0 lb

## 2017-02-02 DIAGNOSIS — R634 Abnormal weight loss: Secondary | ICD-10-CM | POA: Diagnosis not present

## 2017-02-02 DIAGNOSIS — D869 Sarcoidosis, unspecified: Secondary | ICD-10-CM | POA: Diagnosis not present

## 2017-02-02 DIAGNOSIS — Z0000 Encounter for general adult medical examination without abnormal findings: Secondary | ICD-10-CM | POA: Diagnosis not present

## 2017-02-02 MED ORDER — ALBUTEROL SULFATE HFA 108 (90 BASE) MCG/ACT IN AERS: 2.0000 | INHALATION_SPRAY | Freq: Four times a day (QID) | RESPIRATORY_TRACT | 5 refills | Status: DC | PRN

## 2017-02-02 NOTE — Assessment & Plan Note (Signed)
Weight stabilized and colonoscopy with polyps due again in 3 years and all age appropriate cancer screening up to date. Will monitor.

## 2017-02-02 NOTE — Patient Instructions (Signed)
Shoulder Exercises Ask your health care provider which exercises are safe for you. Do exercises exactly as told by your health care provider and adjust them as directed. It is normal to feel mild stretching, pulling, tightness, or discomfort as you do these exercises, but you should stop right away if you feel sudden pain or your pain gets worse.Do not begin these exercises until told by your health care provider. RANGE OF MOTION EXERCISES These exercises warm up your muscles and joints and improve the movement and flexibility of your shoulder. These exercises also help to relieve pain, numbness, and tingling. These exercises involve stretching your injured shoulder directly. Exercise A: Pendulum  1. Stand near a wall or a surface that you can hold onto for balance. 2. Bend at the waist and let your left / right arm hang straight down. Use your other arm to support you. Keep your back straight and do not lock your knees. 3. Relax your left / right arm and shoulder muscles, and move your hips and your trunk so your left / right arm swings freely. Your arm should swing because of the motion of your body, not because you are using your arm or shoulder muscles. 4. Keep moving your body so your arm swings in the following directions, as told by your health care provider: ? Side to side. ? Forward and backward. ? In clockwise and counterclockwise circles. 5. Continue each motion for __________ seconds, or for as long as told by your health care provider. 6. Slowly return to the starting position. Repeat __________ times. Complete this exercise __________ times a day. Exercise B:Flexion, Standing  1. Stand and hold a broomstick, a cane, or a similar object. Place your hands a little more than shoulder-width apart on the object. Your left / right hand should be palm-up, and your other hand should be palm-down. 2. Keep your elbow straight and keep your shoulder muscles relaxed. Push the stick down with  your healthy arm to raise your left / right arm in front of your body, and then over your head until you feel a stretch in your shoulder. ? Avoid shrugging your shoulder while you raise your arm. Keep your shoulder blade tucked down toward the middle of your back. 3. Hold for __________ seconds. 4. Slowly return to the starting position. Repeat __________ times. Complete this exercise __________ times a day. Exercise C: Abduction, Standing 1. Stand and hold a broomstick, a cane, or a similar object. Place your hands a little more than shoulder-width apart on the object. Your left / right hand should be palm-up, and your other hand should be palm-down. 2. While keeping your elbow straight and your shoulder muscles relaxed, push the stick across your body toward your left / right side. Raise your left / right arm to the side of your body and then over your head until you feel a stretch in your shoulder. ? Do not raise your arm above shoulder height, unless your health care provider tells you to do that. ? Avoid shrugging your shoulder while you raise your arm. Keep your shoulder blade tucked down toward the middle of your back. 3. Hold for __________ seconds. 4. Slowly return to the starting position. Repeat __________ times. Complete this exercise __________ times a day. Exercise D:Internal Rotation  1. Place your left / right hand behind your back, palm-up. 2. Use your other hand to dangle an exercise band, a towel, or a similar object over your shoulder. Grasp the band with   your left / right hand so you are holding onto both ends. 3. Gently pull up on the band until you feel a stretch in the front of your left / right shoulder. ? Avoid shrugging your shoulder while you raise your arm. Keep your shoulder blade tucked down toward the middle of your back. 4. Hold for __________ seconds. 5. Release the stretch by letting go of the band and lowering your hands. Repeat __________ times. Complete  this exercise __________ times a day. STRETCHING EXERCISES These exercises warm up your muscles and joints and improve the movement and flexibility of your shoulder. These exercises also help to relieve pain, numbness, and tingling. These exercises are done using your healthy shoulder to help stretch the muscles of your injured shoulder. Exercise E: Corner Stretch (External Rotation and Abduction)  1. Stand in a doorway with one of your feet slightly in front of the other. This is called a staggered stance. If you cannot reach your forearms to the door frame, stand facing a corner of a room. 2. Choose one of the following positions as told by your health care provider: ? Place your hands and forearms on the door frame above your head. ? Place your hands and forearms on the door frame at the height of your head. ? Place your hands on the door frame at the height of your elbows. 3. Slowly move your weight onto your front foot until you feel a stretch across your chest and in the front of your shoulders. Keep your head and chest upright and keep your abdominal muscles tight. 4. Hold for __________ seconds. 5. To release the stretch, shift your weight to your back foot. Repeat __________ times. Complete this stretch __________ times a day. Exercise F:Extension, Standing 1. Stand and hold a broomstick, a cane, or a similar object behind your back. ? Your hands should be a little wider than shoulder-width apart. ? Your palms should face away from your back. 2. Keeping your elbows straight and keeping your shoulder muscles relaxed, move the stick away from your body until you feel a stretch in your shoulder. ? Avoid shrugging your shoulders while you move the stick. Keep your shoulder blade tucked down toward the middle of your back. 3. Hold for __________ seconds. 4. Slowly return to the starting position. Repeat __________ times. Complete this exercise __________ times a day. STRENGTHENING  EXERCISES These exercises build strength and endurance in your shoulder. Endurance is the ability to use your muscles for a long time, even after they get tired. Exercise G:External Rotation  1. Sit in a stable chair without armrests. 2. Secure an exercise band at elbow height on your left / right side. 3. Place a soft object, such as a folded towel or a small pillow, between your left / right upper arm and your body to move your elbow a few inches away (about 10 cm) from your side. 4. Hold the end of the band so it is tight and there is no slack. 5. Keeping your elbow pressed against the soft object, move your left / right forearm out, away from your abdomen. Keep your body steady so only your forearm moves. 6. Hold for __________ seconds. 7. Slowly return to the starting position. Repeat __________ times. Complete this exercise __________ times a day. Exercise H:Shoulder Abduction  1. Sit in a stable chair without armrests, or stand. 2. Hold a __________ weight in your left / right hand, or hold an exercise band with both hands.   3. Start with your arms straight down and your left / right palm facing in, toward your body. 4. Slowly lift your left / right hand out to your side. Do not lift your hand above shoulder height unless your health care provider tells you that this is safe. ? Keep your arms straight. ? Avoid shrugging your shoulder while you do this movement. Keep your shoulder blade tucked down toward the middle of your back. 5. Hold for __________ seconds. 6. Slowly lower your arm, and return to the starting position. Repeat __________ times. Complete this exercise __________ times a day. Exercise I:Shoulder Extension 1. Sit in a stable chair without armrests, or stand. 2. Secure an exercise band to a stable object in front of you where it is at shoulder height. 3. Hold one end of the exercise band in each hand. Your palms should face each other. 4. Straighten your elbows and  lift your hands up to shoulder height. 5. Step back, away from the secured end of the exercise band, until the band is tight and there is no slack. 6. Squeeze your shoulder blades together as you pull your hands down to the sides of your thighs. Stop when your hands are straight down by your sides. Do not let your hands go behind your body. 7. Hold for __________ seconds. 8. Slowly return to the starting position. Repeat __________ times. Complete this exercise __________ times a day. Exercise J:Standing Shoulder Row 1. Sit in a stable chair without armrests, or stand. 2. Secure an exercise band to a stable object in front of you so it is at waist height. 3. Hold one end of the exercise band in each hand. Your palms should be in a thumbs-up position. 4. Bend each of your elbows to an "L" shape (about 90 degrees) and keep your upper arms at your sides. 5. Step back until the band is tight and there is no slack. 6. Slowly pull your elbows back behind you. 7. Hold for __________ seconds. 8. Slowly return to the starting position. Repeat __________ times. Complete this exercise __________ times a day. Exercise K:Shoulder Press-Ups  1. Sit in a stable chair that has armrests. Sit upright, with your feet flat on the floor. 2. Put your hands on the armrests so your elbows are bent and your fingers are pointing forward. Your hands should be about even with the sides of your body. 3. Push down on the armrests and use your arms to lift yourself off of the chair. Straighten your elbows and lift yourself up as much as you comfortably can. ? Move your shoulder blades down, and avoid letting your shoulders move up toward your ears. ? Keep your feet on the ground. As you get stronger, your feet should support less of your body weight as you lift yourself up. 4. Hold for __________ seconds. 5. Slowly lower yourself back into the chair. Repeat __________ times. Complete this exercise __________ times a  day. Exercise L: Wall Push-Ups  1. Stand so you are facing a stable wall. Your feet should be about one arm-length away from the wall. 2. Lean forward and place your palms on the wall at shoulder height. 3. Keep your feet flat on the floor as you bend your elbows and lean forward toward the wall. 4. Hold for __________ seconds. 5. Straighten your elbows to push yourself back to the starting position. Repeat __________ times. Complete this exercise __________ times a day. This information is not intended to replace advice   given to you by your health care provider. Make sure you discuss any questions you have with your health care provider. Document Released: 11/24/2004 Document Revised: 10/05/2015 Document Reviewed: 09/21/2014 Elsevier Interactive Patient Education  2018 [place] Maintenance, Female Adopting a healthy lifestyle and getting preventive care can go a long way to promote health and wellness. Talk with your health care provider about what schedule of regular examinations is right for you. This is a good chance for you to check in with your provider about disease prevention and staying healthy. In between checkups, there are plenty of things you can do on your own. Experts have done a lot of research about which lifestyle changes and preventive measures are most likely to keep you healthy. Ask your health care provider for more information. Weight and diet Eat a healthy diet  Be sure to include plenty of vegetables, fruits, low-fat dairy products, and lean protein.  Do not eat a lot of foods high in solid fats, added sugars, or salt.  Get regular exercise. This is one of the most important things you can do for your health. ? Most adults should exercise for at least 150 minutes each week. The exercise should increase your heart rate and make you sweat (moderate-intensity exercise). ? Most adults should also do strengthening exercises at least twice a week. This is in  addition to the moderate-intensity exercise.  Maintain a healthy weight  Body mass index (BMI) is a measurement that can be used to identify possible weight problems. It estimates body fat based on height and weight. Your health care provider can help determine your BMI and help you achieve or maintain a healthy weight.  For females 61 years of age and older: ? A BMI below 18.5 is considered underweight. ? A BMI of 18.5 to 24.9 is normal. ? A BMI of 25 to 29.9 is considered overweight. ? A BMI of 30 and above is considered obese.  Watch levels of cholesterol and blood lipids  You should start having your blood tested for lipids and cholesterol at 63 years of age, then have this test every 5 years.  You may need to have your cholesterol levels checked more often if: ? Your lipid or cholesterol levels are high. ? You are older than 63 years of age. ? You are at high risk for heart disease.  Cancer screening Lung Cancer  Lung cancer screening is recommended for adults 18-78 years old who are at high risk for lung cancer because of a history of smoking.  A yearly low-dose CT scan of the lungs is recommended for people who: ? Currently smoke. ? Have quit within the past 15 years. ? Have at least a 30-pack-year history of smoking. A pack year is smoking an average of one pack of cigarettes a day for 1 year.  Yearly screening should continue until it has been 15 years since you quit.  Yearly screening should stop if you develop a health problem that would prevent you from having lung cancer treatment.  Breast Cancer  Practice breast self-awareness. This means understanding how your breasts normally appear and feel.  It also means doing regular breast self-exams. Let your health care provider know about any changes, no matter how small.  If you are in your 20s or 30s, you should have a clinical breast exam (CBE) by a health care provider every 1-3 years as part of a regular health  exam.  If you are 40 or older,  have a CBE every year. Also consider having a breast X-ray (mammogram) every year.  If you have a family history of breast cancer, talk to your health care provider about genetic screening.  If you are at high risk for breast cancer, talk to your health care provider about having an MRI and a mammogram every year.  Breast cancer gene (BRCA) assessment is recommended for women who have family members with BRCA-related cancers. BRCA-related cancers include: ? Breast. ? Ovarian. ? Tubal. ? Peritoneal cancers.  Results of the assessment will determine the need for genetic counseling and BRCA1 and BRCA2 testing.  Cervical Cancer Your health care provider may recommend that you be screened regularly for cancer of the pelvic organs (ovaries, uterus, and vagina). This screening involves a pelvic examination, including checking for microscopic changes to the surface of your cervix (Pap test). You may be encouraged to have this screening done every 3 years, beginning at age 29.  For women ages 39-65, health care providers may recommend pelvic exams and Pap testing every 3 years, or they may recommend the Pap and pelvic exam, combined with testing for human papilloma virus (HPV), every 5 years. Some types of HPV increase your risk of cervical cancer. Testing for HPV may also be done on women of any age with unclear Pap test results.  Other health care providers may not recommend any screening for nonpregnant women who are considered low risk for pelvic cancer and who do not have symptoms. Ask your health care provider if a screening pelvic exam is right for you.  If you have had past treatment for cervical cancer or a condition that could lead to cancer, you need Pap tests and screening for cancer for at least 20 years after your treatment. If Pap tests have been discontinued, your risk factors (such as having a new sexual partner) need to be reassessed to determine if  screening should resume. Some women have medical problems that increase the chance of getting cervical cancer. In these cases, your health care provider may recommend more frequent screening and Pap tests.  Colorectal Cancer  This type of cancer can be detected and often prevented.  Routine colorectal cancer screening usually begins at 63 years of age and continues through 62 years of age.  Your health care provider may recommend screening at an earlier age if you have risk factors for colon cancer.  Your health care provider may also recommend using home test kits to check for hidden blood in the stool.  A small camera at the end of a tube can be used to examine your colon directly (sigmoidoscopy or colonoscopy). This is done to check for the earliest forms of colorectal cancer.  Routine screening usually begins at age 55.  Direct examination of the colon should be repeated every 5-10 years through 63 years of age. However, you may need to be screened more often if early forms of precancerous polyps or small growths are found.  Skin Cancer  Check your skin from head to toe regularly.  Tell your health care provider about any new moles or changes in moles, especially if there is a change in a mole's shape or color.  Also tell your health care provider if you have a mole that is larger than the size of a pencil eraser.  Always use sunscreen. Apply sunscreen liberally and repeatedly throughout the day.  Protect yourself by wearing long sleeves, pants, a wide-brimmed hat, and sunglasses whenever you are outside.  Heart  disease, diabetes, and high blood pressure  High blood pressure causes heart disease and increases the risk of stroke. High blood pressure is more likely to develop in: ? People who have blood pressure in the high end of the normal range (130-139/85-89 mm Hg). ? People who are overweight or obese. ? People who are African American.  If you are 64-31 years of age, have  your blood pressure checked every 3-5 years. If you are 23 years of age or older, have your blood pressure checked every year. You should have your blood pressure measured twice-once when you are at a hospital or clinic, and once when you are not at a hospital or clinic. Record the average of the two measurements. To check your blood pressure when you are not at a hospital or clinic, you can use: ? An automated blood pressure machine at a pharmacy. ? A home blood pressure monitor.  If you are between 24 years and 39 years old, ask your health care provider if you should take aspirin to prevent strokes.  Have regular diabetes screenings. This involves taking a blood sample to check your fasting blood sugar level. ? If you are at a normal weight and have a low risk for diabetes, have this test once every three years after 63 years of age. ? If you are overweight and have a high risk for diabetes, consider being tested at a younger age or more often. Preventing infection Hepatitis B  If you have a higher risk for hepatitis B, you should be screened for this virus. You are considered at high risk for hepatitis B if: ? You were born in a country where hepatitis B is common. Ask your health care provider which countries are considered high risk. ? Your parents were born in a high-risk country, and you have not been immunized against hepatitis B (hepatitis B vaccine). ? You have HIV or AIDS. ? You use needles to inject street drugs. ? You live with someone who has hepatitis B. ? You have had sex with someone who has hepatitis B. ? You get hemodialysis treatment. ? You take certain medicines for conditions, including cancer, organ transplantation, and autoimmune conditions.  Hepatitis C  Blood testing is recommended for: ? Everyone born from [birth year] through 1966. ? Anyone with known risk factors for hepatitis C.  Sexually transmitted infections (STIs)  You should be screened for sexually  transmitted infections (STIs) including gonorrhea and chlamydia if: ? You are sexually active and are younger than 63 years of age. ? You are older than 63 years of age and your health care provider tells you that you are at risk for this type of infection. ? Your sexual activity has changed since you were last screened and you are at an increased risk for chlamydia or gonorrhea. Ask your health care provider if you are at risk.  If you do not have HIV, but are at risk, it may be recommended that you take a prescription medicine daily to prevent HIV infection. This is called pre-exposure prophylaxis (PrEP). You are considered at risk if: ? You are sexually active and do not regularly use condoms or know the HIV status of your partner(s). ? You take drugs by injection. ? You are sexually active with a partner who has HIV.  Talk with your health care provider about whether you are at high risk of being infected with HIV. If you choose to begin PrEP, you should first be tested for HIV. You  should then be tested every 3 months for as long as you are taking PrEP. Pregnancy  If you are premenopausal and you may become pregnant, ask your health care provider about preconception counseling.  If you may become pregnant, take 400 to 800 micrograms (mcg) of folic acid every day.  If you want to prevent pregnancy, talk to your health care provider about birth control (contraception). Osteoporosis and menopause  Osteoporosis is a disease in which the bones lose minerals and strength with aging. This can result in serious bone fractures. Your risk for osteoporosis can be identified using a bone density scan.  If you are 63 years of age or older, or if you are at risk for osteoporosis and fractures, ask your health care provider if you should be screened.  Ask your health care provider whether you should take a calcium or vitamin D supplement to lower your risk for osteoporosis.  Menopause may have  certain physical symptoms and risks.  Hormone replacement therapy may reduce some of these symptoms and risks. Talk to your health care provider about whether hormone replacement therapy is right for you. Follow these instructions at home:  Schedule regular health, dental, and eye exams.  Stay current with your immunizations.  Do not use any tobacco products including cigarettes, chewing tobacco, or electronic cigarettes.  If you are pregnant, do not drink alcohol.  If you are breastfeeding, limit how much and how often you drink alcohol.  Limit alcohol intake to no more than 1 drink per day for nonpregnant women. One drink equals 12 ounces of beer, 5 ounces of wine, or 1 ounces of hard liquor.  Do not use street drugs.  Do not share needles.  Ask your health care provider for help if you need support or information about quitting drugs.  Tell your health care provider if you often feel depressed.  Tell your health care provider if you have ever been abused or do not feel safe at home. This information is not intended to replace advice given to you by your health care provider. Make sure you discuss any questions you have with your health care provider. Document Released: 07/26/2010 Document Revised: 06/18/2015 Document Reviewed: 10/14/2014 Elsevier Interactive Patient Education  [employer].

## 2017-02-02 NOTE — Assessment & Plan Note (Signed)
Refilled albuterol. Will ask her pulmonary provider if they can add lipid panel to her next blood draw to reduce sticks.

## 2017-02-02 NOTE — Progress Notes (Signed)
   Subjective:    Patient ID: Lauren Massey, female    DOB: [birth year], 62 y.o.   MRN: [id number]  HPI The patient is a 62 YO female coming in for physical. Mammogram up to date through work health system, due next month.   PMH, [facility], social history reviewed and updated  Review of Systems  Constitutional: Negative.   HENT: Negative.   Eyes: Negative.   Respiratory: Negative for cough, chest tightness and shortness of breath.   Cardiovascular: Negative for chest pain, palpitations and leg swelling.  Gastrointestinal: Negative for abdominal distention, abdominal pain, constipation, diarrhea, nausea and vomiting.  Musculoskeletal: Negative.   Skin: Negative.   Neurological: Negative.   Psychiatric/Behavioral: Negative.       Objective:   Physical Exam  Constitutional: She is oriented to person, place, and time. She appears well-developed and well-nourished.  HENT:  Head: Normocephalic and atraumatic.  Eyes: EOM are normal.  Neck: Normal range of motion.  Cardiovascular: Normal rate and regular rhythm.  Pulmonary/Chest: Effort normal and breath sounds normal. No respiratory distress. She has no wheezes. She has no rales.  Abdominal: Soft. Bowel sounds are normal. She exhibits no distension. There is no tenderness. There is no rebound.  Musculoskeletal: She exhibits no edema.  Neurological: She is alert and oriented to person, place, and time. Coordination normal.  Skin: Skin is warm and dry.  Psychiatric: She has a normal mood and affect.   Vitals:   02/02/17 0926  BP: (!) 144/84  Pulse: 70  Temp: 97.6 F (36.4 C)  TempSrc: Oral  SpO2: 95%  Weight: 133 lb (60.3 kg)  Height: 5\' 2"  (1.575 m)      Assessment & Plan:  Flu shot given at visit

## 2017-02-02 NOTE — Assessment & Plan Note (Signed)
Mammogram up to date and colonoscopy done last year. Tetanus declined. HIV screening declined. Flu shot given at visit. Counseled about shingrix and she declined for now. Given screening recommendations.

## 2017-03-15 LAB — HM MAMMOGRAPHY

## 2017-03-16 ENCOUNTER — Ambulatory Visit (INDEPENDENT_AMBULATORY_CARE_PROVIDER_SITE_OTHER): Payer: BLUE CROSS/BLUE SHIELD | Admitting: Internal Medicine

## 2017-03-16 ENCOUNTER — Encounter: Payer: Self-pay | Admitting: Internal Medicine

## 2017-03-16 DIAGNOSIS — D869 Sarcoidosis, unspecified: Secondary | ICD-10-CM | POA: Diagnosis not present

## 2017-03-16 DIAGNOSIS — J9611 Chronic respiratory failure with hypoxia: Secondary | ICD-10-CM | POA: Diagnosis not present

## 2017-03-16 LAB — PULMONARY FUNCTION TEST
DL/VA % pred: 55 %
DL/VA: 2.56 ml/min/mmHg/L
DLCO unc % pred: 26 %
DLCO unc: 5.87 ml/min/mmHg
FEF 25-75 Post: 2.21 L/sec
FEF 25-75 Pre: 1.82 L/sec
FEF2575-%CHANGE-POST: 21 %
FEF2575-%Pred-Post: 117 %
FEF2575-%Pred-Pre: 96 %
FEV1-%CHANGE-POST: 2 %
FEV1-%PRED-POST: 76 %
FEV1-%PRED-PRE: 74 %
FEV1-PRE: 1.42 L
FEV1-Post: 1.46 L
FEV1FVC-%CHANGE-POST: 3 %
FEV1FVC-%Pred-Pre: 110 %
FEV6-%CHANGE-POST: 0 %
FEV6-%PRED-PRE: 69 %
FEV6-%Pred-Post: 69 %
FEV6-PRE: 1.63 L
FEV6-Post: 1.62 L
FEV6FVC-%PRED-PRE: 104 %
FEV6FVC-%Pred-Post: 104 %
FVC-%Change-Post: 0 %
FVC-%PRED-PRE: 67 %
FVC-%Pred-Post: 66 %
FVC-POST: 1.62 L
FVC-Pre: 1.63 L
POST FEV1/FVC RATIO: 90 %
POST FEV6/FVC RATIO: 100 %
PRE FEV6/FVC RATIO: 100 %
Pre FEV1/FVC ratio: 87 %
RV % PRED: 41 %
RV: 0.82 L
TLC % PRED: 50 %
TLC: 2.43 L

## 2017-03-16 MED ORDER — FLUTICASONE-UMECLIDIN-VILANT 100-62.5-25 MCG/INH IN AEPB: 1.0000 | INHALATION_SPRAY | Freq: Every day | RESPIRATORY_TRACT | 0 refills | Status: DC

## 2017-03-16 NOTE — Patient Instructions (Signed)
Sample Trelegy    Inhale 1 puff, then rinse mouth, once daily    See if it helps your breathing  Continue O2 2l for sleep and as needed

## 2017-03-16 NOTE — Progress Notes (Signed)
PFT done today. 

## 2017-03-16 NOTE — Progress Notes (Signed)
Patient ID: Lauren Massey, female    DOB: 06/27/1955, 62 y.o.   MRN: [id number]  HPI  Female never smoker followed for Sarcoid III involving eyes/iritis, lungs and skin-bronchoscopy 2000. Failed Plaquenil for skin rash. ACE 02/19/15- elevated 97, down from >100 07/2014.  Office Spirometry 08/06/2015-moderate restriction. FVC 1.53/63%, FEV1 1.19/62%, FEV1/FVC 0.78 Room air saturation at rest on arrival 03/14/16- 88%, desaturating to 85% with ambulation and improving to 98% ambulating on 2 L. PFT 03/16/17-moderately severe restriction, severe reduction of diffusion.  FVC 1.62/66%, FEV1 1.46/76%, ratio 0.90, TLC 50%, DLCO 26% ------------------------------------------------------------------------------------------------------- 09/12/16- 62 year old female never smoker followed for sarcoid III involving eyes/iritis, lungs, skin-bronchoscopy 2000 O2 2 L / APS ACE 05/12/16-   50 (9-67) BNP 05/12/16-   26 Pt still becomes SOB mainly on exertion with a dry cough and does have some CP if she coughs too much. She works on room air, putting oxygen on for a while when she gets home and not consistently using it for sleep. Oxygen saturation on arrival on room air recorded at 99% today. Minimal dry cough with no wheezing. Denies any acute events or major problems. Now on methotrexate from her eye doctor for sarcoid of the eye. Denies fever, adenopathy.  03/16/17- 62 year old female never smoker followed for sarcoid III involving eyes/iritis, lungs, skin-bronchoscopy 2000 O2 2 L / APS MTX, , Proventil HFA She continues to use oxygen for sleep but presents using portable oxygen because she works at her own pace in a [employer].  We discussed getting a POC for use when needed and she wanted to wait. Stable DOE.  Adenopathy, sweats, rash, chest pain.  Rescue inhaler has little effect. PFT 03/16/17-severe restriction, severe reduction of DLCO.  FVC 1.62/66%, FEV1 1.46/76%, ratio 0.90, FEF 25-75% 2.21/117%,  TLC 50%, DLCO 26% ----6 month follow for sarcoidosis. States breathing has been the same since the last visit. Had a .  CXR 03/14/16- IMPRESSION: 1. Chronic interstitial disease possibly related to the patient's known sarcoid. 2. Cardiomegaly with slight increase interstitial markings noted from prior exam suggesting a component of interstitial pulmonary edema. Active pneumonitis cannot be excluded  Review of Systems- see HPI   + = pos Constitutional:   weight loss, night sweats, fevers, chills, fatigue, lassitude. HEENT:   No-  headaches, difficulty swallowing, tooth/dental problems, sore throat,       No-  sneezing, itching, ear ache, nasal congestion, post nasal drip,  CV:  No-   chest pain, orthopnea, PND, swelling in lower extremities, anasarca,   dizziness, palpitations Resp:+shortness of breath with exertion or at rest.               productive cough,  + non-productive cough,  No-  coughing up of blood.              No-   change in color of mucus.  No- wheezing.   Skin: +  rash or lesions. GI:  No-   heartburn, indigestion, abdominal pain, nausea, vomiting,  GU:  MS:  No-   joint pain or swelling.  No- decreased range of motion.  No- back pain. Neuro- nothing unusual Psych:  No- change in mood or affect. No depression or anxiety.  No memory loss.  Objective:   Physical Exam General- Alert, Oriented, Affect-appropriate, Distress- none acute,  Skin- + multiple small linear plaques along flexure lines left side of neck, down onto upper chest,  in the right posterior axillary line and extensor surfaces of forearms and elbows she  has stable macular lesions which she says itch and are tender. Lymphadenopathy- none Head- atraumatic            Eyes- Gross vision intact, PERRLA, conjunctivae clear secretions            Ears- Hearing, canals normal            Nose- Clear, No-Septal dev, mucus, polyps, erosion, perforation             Throat- Mallampati IV , mucosa clear , drainage-  none, tonsils- atrophic Neck- flexible , trachea midline, no stridor , thyroid nl, carotid no bruit Chest - symmetrical excursion , unlabored           Heart/CV- RRR , no murmur , no gallop  , no rub, nl s1 s2                           - JVD +1 , edema- none, stasis changes- none, varices- none           Lung- + few squeaks, wheeze- none, cough - none , dullness-none, rub- none,                 Chest wall- + 2 cm rubbery somewhat mobile subcutaneous nodule at distal end of left clavicle Abd- no HSM Br/ Gen/ Rectal- Not done, not indicated Extrem- cyanosis- none, clubbing, none, atrophy- none, strength- nl, +1 cm rubbery nodule at base R thumb Neuro- grossly intact to observation Assessment & Plan:

## 2017-03-17 DIAGNOSIS — J9611 Chronic respiratory failure with hypoxia: Secondary | ICD-10-CM | POA: Insufficient documentation

## 2017-03-17 NOTE — Assessment & Plan Note (Signed)
She continues O2 2 L for sleep/APS and qualifies for portable oxygen but cannot make it work with her job.  She paces herself and does not feel significant respiratory distress during the day on room air.  Sustained discussion.  She has a portable tank from [facility]PS and is encouraged to use that point she can during exertion.

## 2017-03-17 NOTE — Assessment & Plan Note (Signed)
Not much evidence of systemic sarcoid or active pulmonary disease now.  Fairly stable although skin lesions and therapy for ocular sarcoid continue. Plan-sample Trelegy for trial with discussion.

## 2017-09-14 ENCOUNTER — Ambulatory Visit: Payer: BLUE CROSS/BLUE SHIELD | Admitting: Internal Medicine

## 2017-09-18 ENCOUNTER — Ambulatory Visit: Payer: BLUE CROSS/BLUE SHIELD | Admitting: Internal Medicine

## 2017-10-19 ENCOUNTER — Ambulatory Visit: Payer: BLUE CROSS/BLUE SHIELD | Admitting: Internal Medicine

## 2017-11-02 ENCOUNTER — Encounter: Payer: Self-pay | Admitting: Nurse Practitioner

## 2017-11-02 ENCOUNTER — Ambulatory Visit: Payer: BLUE CROSS/BLUE SHIELD | Admitting: Nurse Practitioner

## 2017-11-02 ENCOUNTER — Other Ambulatory Visit: Payer: BLUE CROSS/BLUE SHIELD

## 2017-11-02 VITALS — BP 148/88 | HR 73 | Ht 62.0 in | Wt 132.0 lb

## 2017-11-02 DIAGNOSIS — R8290 Unspecified abnormal findings in urine: Secondary | ICD-10-CM | POA: Diagnosis not present

## 2017-11-02 DIAGNOSIS — Z23 Encounter for immunization: Secondary | ICD-10-CM

## 2017-11-02 LAB — POCT URINALYSIS DIPSTICK
BILIRUBIN UA: NEGATIVE
Blood, UA: NEGATIVE
GLUCOSE UA: NEGATIVE
Ketones, UA: NEGATIVE
Nitrite, UA: NEGATIVE
PH UA: 6 (ref 5.0–8.0)
Protein, UA: NEGATIVE
Spec Grav, UA: 1.015 (ref 1.010–1.025)
UROBILINOGEN UA: 0.2 U/dL

## 2017-11-02 NOTE — Progress Notes (Signed)
Lauren Massey is a 62 y.o. female with the following history as recorded in [employer]:  Patient Active Problem List   Diagnosis Date Noted  . Chronic respiratory failure with hypoxia ([place]) 03/17/2017  . Routine general medical examination at a health care facility 02/02/2017  . Loss of weight 08/03/2015  . SARCOIDOSIS, PULMONARY 05/18/2007  . Iridocyclitis 05/18/2007    Current Outpatient Medications  Medication Sig Dispense Refill  . albuterol (PROVENTIL HFA;VENTOLIN HFA) 108 (90 Base) MCG/ACT inhaler Inhale 2 puffs into the lungs every 6 (six) hours as needed for wheezing or shortness of breath. 1 Inhaler 5  . Fluticasone-Umeclidin-Vilant (TRELEGY ELLIPTA) 100-62.5-25 MCG/INH AEPB Inhale 1 puff into the lungs daily. 1 each 0  . folic acid (FOLVITE) 1 MG tablet Take 1 mg by mouth daily.    . methotrexate (RHEUMATREX) 2.5 MG tablet Take 2.5 mg by mouth once a week. Caution:Chemotherapy. Protect from light. Takes 10 tablets once a week    . prednisoLONE acetate (PRED FORTE) 1 % ophthalmic suspension Place 1 drop into the right eye 2 (two) times daily. Reported on 04/16/2015 (Patient taking differently: Place 1 drop into the right eye 4 (four) times daily. Reported on 04/16/2015) 5 mL 1   No current facility-administered medications for this visit.     Allergies: Patient has no known allergies.  Past Medical History:  Diagnosis Date  . Cataract    right eye  . Sarcoidosis    skin, eye, lung  . Unspecified iridocyclitis     Past Surgical History:  Procedure Laterality Date  . ABDOMINAL HYSTERECTOMY    . APPENDECTOMY    . BRONCHOSCOPY  2003  . CATARACT EXTRACTION    . VESICOVAGINAL FISTULA CLOSURE W/ TAH      Family History  Problem Relation Age of Onset  . Cancer Father   . Heart disease Father   . Diabetes Unknown   . Colon cancer Neg Hx     Social History   Tobacco Use  . Smoking status: Never Smoker  . Smokeless tobacco: Never Used  Substance Use Topics  . Alcohol  use: No     Subjective:  [name] is here today for evaluation of acute complaint of foul smelling urine, first noticed this on this past Tuesday and has been persistent since. She denies chills, flank pain, abdominal pain, nausea, vomiting, vaginal discharge or bleeding, dysuria, hematuria, urinary hesitancy. Overall feels well. No history of UTI She often wakes to urinate at night, she says this is nothing new for her, drinks "a lot of water" before bed.  ROS - See HPI  Objective:  Vitals:   11/02/17 1026  BP: (!) 148/88  Pulse: 73  SpO2: 94%  Weight: 132 lb (59.9 kg)  Height: 5\' 2"  (1.575 m)    General: Well developed, well nourished, in no acute distress  Skin : Warm and dry.  Head: Normocephalic and atraumatic  Eyes: Sclera and conjunctiva clear; pupils round and reactive to light; extraocular movements intact  Oropharynx: Pink, supple. Neck: Supple Lungs: Respirations unlabored; clear to auscultation bilaterally  CVS exam: normal rate and regular rhythm, S1 and S2 normal.  Abdomen: Soft; nontender; nondistended; normoactive bowel sounds; no CVA tenderness  Vessels: Symmetric bilaterally  Neurologic: Alert and oriented; speech intact; face symmetrical; moves all extremities well; CNII-XII intact without focal deficit   Physical Exam   Assessment:  1. Need for influenza vaccination   2. Increased urinary frequency     Plan:   No  follow-ups on file.  Orders Placed This Encounter  Procedures  . Urine Culture    Standing Status:   Future    Standing Expiration Date:   12/03/2017  . Flu Vaccine QUAD 36+ mos IM  . POCT urinalysis dipstick    Requested Prescriptions    No prescriptions requested or ordered in this encounter    Need for influenza vaccination- Flu Vaccine QUAD 36+ mos IM  Foul smelling urine Urine dip negative for nitrites, blood Could be uti but Aside from smell she does not display other symptoms of UTI, No treatment started today which she  was agreeable to Will send urine for culture and F/U with further recommendations pending lab results Red flags and return precautions including when to seek immediate care discussed and printed on AVS - POCT urinalysis dipstick - Urine Culture; Future

## 2017-11-02 NOTE — Patient Instructions (Signed)
I will let you know when your urine culture returns.   Urinary Tract Infection, Adult A urinary tract infection (UTI) is an infection of any part of the urinary tract. The urinary tract includes the:  Kidneys.  Ureters.  Bladder.  Urethra.  These organs make, store, and get rid of pee (urine) in the body. Follow these instructions at home:  Take over-the-counter and prescription medicines only as told by your doctor.  If you were prescribed an antibiotic medicine, take it as told by your doctor. Do not stop taking the antibiotic even if you start to feel better.  Avoid the following drinks: ? Alcohol. ? Caffeine. ? Tea. ? Carbonated drinks.  Drink enough fluid to keep your pee clear or pale yellow.  Keep all follow-up visits as told by your doctor. This is important.  Make sure to: ? Empty your bladder often and completely. Do not to hold pee for long periods of time. ? Empty your bladder before and after sex. ? Wipe from front to back after a bowel movement if you are female. Use each tissue one time when you wipe. Contact a doctor if:  You have back pain.  You have a fever.  You feel sick to your stomach (nauseous).  You throw up (vomit).  Your symptoms do not get better after 3 days.  Your symptoms go away and then come back. Get help right away if:  You have very bad back pain.  You have very bad lower belly (abdominal) pain.  You are throwing up and cannot keep down any medicines or water. This information is not intended to replace advice given to you by your health care provider. Make sure you discuss any questions you have with your health care provider. Document Released: 06/29/2007 Document Revised: 06/18/2015 Document Reviewed: 12/01/2014 Elsevier Interactive Patient Education  [employer].

## 2017-11-03 LAB — URINE CULTURE
MICRO NUMBER: 91220285
SPECIMEN QUALITY: ADEQUATE

## 2018-01-18 ENCOUNTER — Ambulatory Visit: Payer: BLUE CROSS/BLUE SHIELD | Admitting: Internal Medicine

## 2018-01-30 DIAGNOSIS — H35371 Puckering of macula, right eye: Secondary | ICD-10-CM | POA: Diagnosis not present

## 2018-01-30 DIAGNOSIS — H21542 Posterior synechiae (iris), left eye: Secondary | ICD-10-CM | POA: Diagnosis not present

## 2018-01-30 DIAGNOSIS — H30003 Unspecified focal chorioretinal inflammation, bilateral: Secondary | ICD-10-CM | POA: Diagnosis not present

## 2018-01-30 DIAGNOSIS — H35372 Puckering of macula, left eye: Secondary | ICD-10-CM | POA: Diagnosis not present

## 2018-01-30 DIAGNOSIS — H3581 Retinal edema: Secondary | ICD-10-CM | POA: Diagnosis not present

## 2018-01-31 ENCOUNTER — Ambulatory Visit (INDEPENDENT_AMBULATORY_CARE_PROVIDER_SITE_OTHER): Payer: Self-pay | Admitting: Internal Medicine

## 2018-01-31 ENCOUNTER — Ambulatory Visit (INDEPENDENT_AMBULATORY_CARE_PROVIDER_SITE_OTHER)
Admission: RE | Admit: 2018-01-31 | Discharge: 2018-01-31 | Disposition: A | Discharge status: Home / Self Care | Payer: 59 | Source: Ambulatory Visit | Attending: Internal Medicine | Admitting: Internal Medicine

## 2018-01-31 ENCOUNTER — Encounter: Payer: Self-pay | Admitting: Internal Medicine

## 2018-01-31 VITALS — BP 114/76 | HR 77 | Ht 62.0 in | Wt 130.6 lb

## 2018-01-31 DIAGNOSIS — D869 Sarcoidosis, unspecified: Secondary | ICD-10-CM

## 2018-01-31 DIAGNOSIS — J9611 Chronic respiratory failure with hypoxia: Secondary | ICD-10-CM

## 2018-01-31 NOTE — Patient Instructions (Signed)
Order- CXR  Dx sarcoid  Ok to put an over the counter cortisone ointment on the rash for a week or so to see if it helps.  Please call if we can help

## 2018-01-31 NOTE — Progress Notes (Signed)
Patient ID: Lauren Massey, female    DOB: 31-Aug-1955, 63 y.o.   MRN: [id number]  HPI  Female never smoker followed for Sarcoid III involving eyes/iritis, lungs and skin-bronchoscopy 2000. Failed Plaquenil for skin rash. ACE 02/19/15- elevated 97, down from >100 07/2014.  ACE 05/12/16-   50 (9-67) Office Spirometry 08/06/2015-moderate restriction. FVC 1.53/63%, FEV1 1.19/62%, FEV1/FVC 0.78 Room air saturation at rest on arrival 03/14/16- 88%, desaturating to 85% with ambulation and improving to 98% ambulating on 2 L. PFT 03/16/17-moderately severe restriction, severe reduction of diffusion.  FVC 1.62/66%, FEV1 1.46/76%, ratio 0.90, TLC 50%, DLCO 26% -------------------------------------------------------------------------------------------------------  03/16/17- 64 year old female never smoker followed for sarcoid III involving eyes/iritis, lungs, skin-bronchoscopy 2000 O2 2 L / APS MTX, , Proventil HFA She continues to use oxygen for sleep but presents using portable oxygen because she works at her own pace in a [employer].  We discussed getting a POC for use when needed and she wanted to wait. Stable DOE.  Adenopathy, sweats, rash, chest pain.  Rescue inhaler has little effect. PFT 03/16/17-severe restriction, severe reduction of DLCO.  FVC 1.62/66%, FEV1 1.46/76%, ratio 0.90, FEF 25-75% 2.21/117%, TLC 50%, DLCO 26% ----6 month follow for sarcoidosis. States breathing has been the same since the last visit. Had a .  CXR 03/14/16- IMPRESSION: 1. Chronic interstitial disease possibly related to the patient's known sarcoid. 2. Cardiomegaly with slight increase interstitial markings noted from prior exam suggesting a component of interstitial pulmonary edema. Active pneumonitis cannot be excluded  01/31/2018-  63 year old female never smoker followed for Sarcoid III involving eyes/iritis, lungs,rectum, skin-bronchoscopy 2000, chronic hypoxic respiratory failure O2 2 L / APS MTX, ,  Proventil HFA, Trelegy, No acute exacerbation.  She keeps some chronic dry cough and feels it takes her longer than average to get over colds.  Has never cleared chronic skin lesions on her back attributed to sarcoid.  Notices some irritation on the dorsum of right hand in the last few days blamed on using bleach and dishwashing fluid.  No adenopathy, night sweats or chest pain.  Review of Systems- see HPI   + = pos Constitutional:   weight loss, night sweats, fevers, chills, fatigue, lassitude. HEENT:   No-  headaches, difficulty swallowing, tooth/dental problems, sore throat,       No-  sneezing, itching, ear ache, nasal congestion, post nasal drip,  CV:  No-   chest pain, orthopnea, PND, swelling in lower extremities, anasarca,   dizziness, palpitations Resp:+shortness of breath with exertion or at rest.               productive cough,  + non-productive cough,  No-  coughing up of blood.              No-   change in color of mucus.  No- wheezing.   Skin: +  rash or lesions. GI:  No-   heartburn, indigestion, abdominal pain, nausea, vomiting,  GU:  MS:  No-   joint pain or swelling.  No- decreased range of motion.  No- back pain. Neuro- nothing unusual Psych:  No- change in mood or affect. No depression or anxiety.  No memory loss.  Objective:   Physical Exam General- Alert, Oriented, Affect-appropriate, Distress- none acute,  Skin- + multiple small linear plaques along flexure lines left side of neck, down onto upper chest, low back,  in the right posterior axillary line and extensor surfaces of forearms and elbows she has stable macular lesions which she says itch and  are tender. + Reddened nonspecific dermatitis dorsum right hand. Lymphadenopathy- none Head- atraumatic            Eyes- Gross vision intact, PERRLA, conjunctivae clear secretions            Ears- Hearing, canals normal            Nose- Clear, No-Septal dev, mucus, polyps, erosion, perforation             Throat-  Mallampati IV , mucosa clear , drainage- none, tonsils- atrophic Neck- flexible , trachea midline, no stridor , thyroid nl, carotid no bruit Chest - symmetrical excursion , unlabored           Heart/CV- RRR , no murmur , no gallop  , no rub, nl s1 s2                           - JVD +1 , edema- none, stasis changes- none, varices- none           Lung- + few squeaks, wheeze- none, cough - none , dullness-none, rub- none,                 Chest wall- + 2 cm rubbery somewhat mobile subcutaneous nodule at distal end of left clavicle- probable lipoma Abd- no HSM Br/ Gen/ Rectal- Not done, not indicated Extrem- cyanosis- none, clubbing, none, atrophy- none, strength- nl, +1 cm rubbery nodule at base R thumb Neuro- grossly intact to observation Assessment & Plan:

## 2018-01-31 NOTE — Assessment & Plan Note (Signed)
Oxygen use primarily for sleep now. Plan-continue O2 for sleep, CXR

## 2018-01-31 NOTE — Assessment & Plan Note (Signed)
Disseminated sarcoid.  Visible involvement limited to skin lesions on back which are probably old and burned out.  Pulmonary disease has been an active.  Minor dry cough is a nuisance only.

## 2018-02-02 ENCOUNTER — Telehealth: Payer: Self-pay | Admitting: Internal Medicine

## 2018-02-02 NOTE — Telephone Encounter (Signed)
Notes recorded by Deneise Lever, MD on 02/01/2018 at 9:22 AM EST CXR- there is a lot of old scarring from sarcoid, but it is stable and looks inactive, with no no process identified.  Called and spoke with pt letting her know the results of the cxr. Pt expressed understanding. Nothing further needed.

## 2018-02-05 ENCOUNTER — Ambulatory Visit (INDEPENDENT_AMBULATORY_CARE_PROVIDER_SITE_OTHER): Payer: 59 | Admitting: Internal Medicine

## 2018-02-05 ENCOUNTER — Ambulatory Visit (INDEPENDENT_AMBULATORY_CARE_PROVIDER_SITE_OTHER)
Admission: RE | Admit: 2018-02-05 | Discharge: 2018-02-05 | Disposition: A | Discharge status: Home / Self Care | Payer: 59 | Source: Ambulatory Visit | Attending: Internal Medicine | Admitting: Internal Medicine

## 2018-02-05 ENCOUNTER — Encounter: Payer: Self-pay | Admitting: Internal Medicine

## 2018-02-05 ENCOUNTER — Other Ambulatory Visit (INDEPENDENT_AMBULATORY_CARE_PROVIDER_SITE_OTHER): Payer: 59

## 2018-02-05 VITALS — BP 138/88 | HR 70 | Temp 97.8°F | Ht 62.0 in | Wt 132.0 lb

## 2018-02-05 DIAGNOSIS — Z0000 Encounter for general adult medical examination without abnormal findings: Secondary | ICD-10-CM

## 2018-02-05 DIAGNOSIS — D869 Sarcoidosis, unspecified: Secondary | ICD-10-CM | POA: Diagnosis not present

## 2018-02-05 DIAGNOSIS — Z13820 Encounter for screening for osteoporosis: Secondary | ICD-10-CM

## 2018-02-05 DIAGNOSIS — Z23 Encounter for immunization: Secondary | ICD-10-CM | POA: Diagnosis not present

## 2018-02-05 LAB — LIPID PANEL
CHOLESTEROL: 224 mg/dL — AB (ref 0–200)
HDL: 61.9 mg/dL (ref >39.00)
LDL Cholesterol: 144 mg/dL — ABNORMAL HIGH (ref 0–99)
NonHDL: 162.2
Total CHOL/HDL Ratio: 4
Triglycerides: 91 mg/dL (ref 0.0–149.0)
VLDL: 18.2 mg/dL (ref 0.0–40.0)

## 2018-02-05 NOTE — Assessment & Plan Note (Signed)
Flu shot up to date. Shingrix counseled. Tetanus given. Colonoscopy up to date. Mammogram up to date with gyn, pap smear up to date with gyn and dexa ordered. Counseled about sun safety and mole surveillance. Counseled about the dangers of distracted driving. Given 10 year screening recommendations.

## 2018-02-05 NOTE — Progress Notes (Signed)
   Subjective:   Patient ID: Lauren Massey, female    DOB: 1955/11/27, 63 y.o.   MRN: [id number]  HPI The patient is a 63 YO female coming in for physical.   PMH, [place], social history reviewed and updated.   Review of Systems  Constitutional: Negative.   HENT: Negative.   Eyes: Negative.   Respiratory: Positive for shortness of breath. Negative for cough and chest tightness.   Cardiovascular: Negative for chest pain, palpitations and leg swelling.  Gastrointestinal: Negative for abdominal distention, abdominal pain, constipation, diarrhea, nausea and vomiting.  Musculoskeletal: Negative.   Skin: Negative.   Neurological: Negative.   Psychiatric/Behavioral: Negative.     Objective:  Physical Exam Constitutional:      Appearance: She is well-developed.  HENT:     Head: Normocephalic and atraumatic.  Neck:     Musculoskeletal: Normal range of motion.  Cardiovascular:     Rate and Rhythm: Normal rate and regular rhythm.  Pulmonary:     Effort: Pulmonary effort is normal. No respiratory distress.     Breath sounds: Rhonchi present. No wheezing or rales.     Comments: Chronic changes, exam stable Abdominal:     General: Bowel sounds are normal. There is no distension.     Palpations: Abdomen is soft.     Tenderness: There is no abdominal tenderness. There is no rebound.  Skin:    General: Skin is warm and dry.  Neurological:     Mental Status: She is alert and oriented to person, place, and time.     Coordination: Coordination normal.     Vitals:   02/05/18 0902  BP: 138/88  Pulse: 70  Temp: 97.8 F (36.6 C)  TempSrc: Oral  SpO2: 98%  Weight: 132 lb (59.9 kg)  Height: 5\' 2"  (1.575 m)    Assessment & Plan:  Tdap given at visit

## 2018-02-05 NOTE — Patient Instructions (Signed)

## 2018-02-05 NOTE — Assessment & Plan Note (Signed)
Follows with pulmonary and severe diffusion capacity impairment on recent PFTs 2019. Not on meds currently. Taking methotrexate and recent labs with rheum okay.

## 2018-02-08 ENCOUNTER — Encounter: Payer: Self-pay | Admitting: Internal Medicine

## 2018-02-08 NOTE — Progress Notes (Signed)
Abstracted and sent to scan  

## 2018-02-13 ENCOUNTER — Telehealth: Payer: Self-pay | Admitting: Internal Medicine

## 2018-02-13 NOTE — Telephone Encounter (Signed)
Copied from [place]. Topic: General - Other >> Feb 13, 2018  2:51 PM Windy Kalata wrote: Reason for CRM: Fax number for employer for note is as follows, [phone]. Needs a note on letterhead for her CPE visit on 02/05/2018  Attn: Mikeal Hawthorne

## 2018-02-14 NOTE — Telephone Encounter (Signed)
Faxed letter to fax number given

## 2018-03-08 DIAGNOSIS — Z1231 Encounter for screening mammogram for malignant neoplasm of breast: Secondary | ICD-10-CM | POA: Diagnosis not present

## 2018-04-10 DIAGNOSIS — H30003 Unspecified focal chorioretinal inflammation, bilateral: Secondary | ICD-10-CM | POA: Diagnosis not present

## 2018-04-10 DIAGNOSIS — H35371 Puckering of macula, right eye: Secondary | ICD-10-CM | POA: Diagnosis not present

## 2018-04-10 DIAGNOSIS — H3581 Retinal edema: Secondary | ICD-10-CM | POA: Diagnosis not present

## 2018-11-17 LAB — LIPID PANEL
Cholesterol: 233 — AB (ref 0–200)
HDL: 77 — AB (ref 35–70)
LDL Cholesterol: 18
Triglycerides: 106 (ref 40–160)

## 2018-11-20 ENCOUNTER — Encounter: Payer: Self-pay | Admitting: Internal Medicine

## 2018-11-20 NOTE — Progress Notes (Signed)
Abstracted and sent to scan  

## 2019-02-08 ENCOUNTER — Telehealth: Payer: Self-pay | Admitting: Internal Medicine

## 2019-02-08 ENCOUNTER — Other Ambulatory Visit: Payer: Self-pay

## 2019-02-08 ENCOUNTER — Ambulatory Visit (INDEPENDENT_AMBULATORY_CARE_PROVIDER_SITE_OTHER): Payer: 59 | Admitting: Internal Medicine

## 2019-02-08 ENCOUNTER — Ambulatory Visit (INDEPENDENT_AMBULATORY_CARE_PROVIDER_SITE_OTHER): Payer: 59

## 2019-02-08 ENCOUNTER — Encounter: Payer: Self-pay | Admitting: Internal Medicine

## 2019-02-08 VITALS — BP 132/80 | HR 63 | Temp 97.8°F | Ht 62.0 in | Wt 138.8 lb

## 2019-02-08 DIAGNOSIS — J939 Pneumothorax, unspecified: Secondary | ICD-10-CM

## 2019-02-08 DIAGNOSIS — J9611 Chronic respiratory failure with hypoxia: Secondary | ICD-10-CM | POA: Diagnosis not present

## 2019-02-08 DIAGNOSIS — Z23 Encounter for immunization: Secondary | ICD-10-CM

## 2019-02-08 DIAGNOSIS — D869 Sarcoidosis, unspecified: Secondary | ICD-10-CM

## 2019-02-08 MED ORDER — ALBUTEROL SULFATE HFA 108 (90 BASE) MCG/ACT IN AERS: 2.0000 | INHALATION_SPRAY | Freq: Four times a day (QID) | RESPIRATORY_TRACT | 12 refills | Status: DC | PRN

## 2019-02-08 NOTE — Telephone Encounter (Signed)
Spoke with the pt and notified of results per Dr Annamaria Boots  She verbalized understanding  She is aware to come in next week for cxr- I have placed order for this

## 2019-02-08 NOTE — Addendum Note (Signed)
Addended by: Rosana Berger on: 02/08/2019 03:54 PM   Modules accepted: Orders

## 2019-02-08 NOTE — Assessment & Plan Note (Signed)
Discussed O2 use. May be developing PHTN. Plan- use O2 esp sleep and exertion, refill rescue inhaler

## 2019-02-08 NOTE — Patient Instructions (Signed)
Refill script sent for albuterol rescue inhaler  Order- CXR   Dx Sarcoid  Order- Flu vax standard   Please call if we can help

## 2019-02-08 NOTE — Assessment & Plan Note (Signed)
Residual pulmonary scarring. Continues MTX for eyes. Doubt significant active systemic disease. Plan- flu vax, CXR

## 2019-02-08 NOTE — Progress Notes (Signed)
Patient ID: Lauren Massey, female    DOB: Jun 26, 1955, 64 y.o.   MRN: [id number]  HPI  Female never smoker followed for Sarcoid III involving eyes/iritis, lungs and skin-bronchoscopy 2000. Failed Plaquenil for skin rash. ACE 02/19/15- elevated 97, down from >100 07/2014.  ACE 05/12/16-   50 (9-67) Office Spirometry 08/06/2015-moderate restriction. FVC 1.53/63%, FEV1 1.19/62%, FEV1/FVC 0.78 Room air saturation at rest on arrival 03/14/16- 88%, desaturating to 85% with ambulation and improving to 98% ambulating on 2 L. PFT 03/16/17-moderately severe restriction, severe reduction of diffusion.  FVC 1.62/66%, FEV1 1.46/76%, ratio 0.90, TLC 50%, DLCO 26% -------------------------------------------------------------------------------------------------------   01/31/2018-  64 year old female never smoker followed for Sarcoid III involving eyes/iritis, lungs,rectum, skin-bronchoscopy 2000, chronic hypoxic respiratory failure O2 2 L / APS MTX, , Proventil HFA, Trelegy, No acute exacerbation.  She keeps some chronic dry cough and feels it takes her longer than average to get over colds.  Has never cleared chronic skin lesions on her back attributed to sarcoid.  Notices some irritation on the dorsum of right hand in the last few days blamed on using bleach and dishwashing fluid.  No adenopathy, night sweats or chest pain.  02/08/19- 64 year old female never smoker followed for Sarcoid III involving eyes/iritis, lungs,rectum, skin-bronchoscopy 2000, chronic hypoxic respiratory failure O2 2 L / APS     ACE in 2018 was down to 50 ( 9-67) MTX, , Proventil HFA, Trelegy, -----f/u Sarcoidosis.SOB  Not using O2 routinely. Feels breathing stable with some DOE. Dry cough. Denies chest pain, change in rash, fever, nodes. Discussed Covid vax when available. Wants flu vax. CXR 01/31/2018- Severe chronic interstitial lung disease.  No acute findings.  Review of Systems- see HPI   + = positive Constitutional:   weight  loss, night sweats, fevers, chills, fatigue, lassitude. HEENT:   No-  headaches, difficulty swallowing, tooth/dental problems, sore throat,       No-  sneezing, itching, ear ache, nasal congestion, post nasal drip,  CV:  No-   chest pain, orthopnea, PND, swelling in lower extremities, anasarca,   dizziness, palpitations Resp:+shortness of breath with exertion or at rest.               productive cough,  + non-productive cough,  No-  coughing up of blood.              No-   change in color of mucus.  No- wheezing.   Skin: +  rash or lesions. GI:  No-   heartburn, indigestion, abdominal pain, nausea, vomiting,  GU:  MS:  No-   joint pain or swelling.  No- decreased range of motion.  No- back pain. Neuro- nothing unusual Psych:  No- change in mood or affect. No depression or anxiety.  No memory loss.  Objective:   Physical Exam General- Alert, Oriented, Affect-appropriate, Distress- none acute,  Skin- + multiple small linear plaques along flexure lines left side of neck, down onto upper chest, low back,  in the right posterior axillary line and extensor surfaces of forearms and elbows she has stable macular lesions which she says itch and are tender. + Reddened nonspecific dermatitis dorsum right hand. Lymphadenopathy- none Head- atraumatic            Eyes- Gross vision intact, PERRLA, conjunctivae clear secretions            Ears- Hearing, canals normal            Nose- Clear, No-Septal dev, mucus, polyps, erosion, perforation  Throat- Mallampati IV , mucosa clear , drainage- none, tonsils- atrophic Neck- flexible , trachea midline, no stridor , thyroid nl, carotid no bruit Chest - symmetrical excursion , unlabored           Heart/CV- RRR , no murmur , no gallop  , no rub, nl s1,    +P2 prominent                           - JVD +1 , edema- none, stasis changes- none, varices- none           Lung- + few crackles, wheeze- none, cough - none , dullness-none, rub- none,                  Chest wall- + 2 cm rubbery somewhat mobile subcutaneous nodule at distal end of left clavicle- probable lipoma Abd- no HSM Br/ Gen/ Rectal- Not done, not indicated Extrem- cyanosis- none, clubbing, none, atrophy- none, strength- nl, +1 cm rubbery nodule at base R thumb Neuro- grossly intact to observation Assessment & Plan:

## 2019-02-08 NOTE — Telephone Encounter (Signed)
Small apical pneumothorax ( free air) or possibly drained bleb in apex R lung, noted by Radiologist.  She was not symptomatic at office visit to suggest a new event. Plan-  Plan- order future CXR outpatient next week to assess stability

## 2019-02-11 ENCOUNTER — Other Ambulatory Visit: Payer: Self-pay

## 2019-02-11 ENCOUNTER — Ambulatory Visit: Payer: 59

## 2019-02-11 DIAGNOSIS — D869 Sarcoidosis, unspecified: Secondary | ICD-10-CM

## 2019-02-15 ENCOUNTER — Ambulatory Visit (INDEPENDENT_AMBULATORY_CARE_PROVIDER_SITE_OTHER): Payer: 59

## 2019-02-15 DIAGNOSIS — D869 Sarcoidosis, unspecified: Secondary | ICD-10-CM | POA: Diagnosis not present

## 2019-02-19 NOTE — Progress Notes (Signed)
Spoke with pt and notified of results per Dr. Young Pt verbalized understanding and denied any questions. 

## 2019-02-22 ENCOUNTER — Encounter (HOSPITAL_COMMUNITY): Payer: Self-pay | Admitting: Emergency Medicine

## 2019-02-22 ENCOUNTER — Other Ambulatory Visit: Payer: Self-pay

## 2019-02-22 ENCOUNTER — Emergency Department (HOSPITAL_COMMUNITY): Payer: 59

## 2019-02-22 ENCOUNTER — Inpatient Hospital Stay (HOSPITAL_COMMUNITY)
Admission: EM | Admit: 2019-02-22 | Discharge: 2019-02-25 | DRG: 199 | Disposition: A | Discharge status: Home / Self Care | Payer: 59 | Attending: Family Medicine | Admitting: Family Medicine

## 2019-02-22 DIAGNOSIS — J930 Spontaneous tension pneumothorax: Secondary | ICD-10-CM | POA: Diagnosis not present

## 2019-02-22 DIAGNOSIS — J9383 Other pneumothorax: Secondary | ICD-10-CM | POA: Diagnosis present

## 2019-02-22 DIAGNOSIS — R21 Rash and other nonspecific skin eruption: Secondary | ICD-10-CM | POA: Diagnosis present

## 2019-02-22 DIAGNOSIS — Z79899 Other long term (current) drug therapy: Secondary | ICD-10-CM

## 2019-02-22 DIAGNOSIS — Z20822 Contact with and (suspected) exposure to covid-19: Secondary | ICD-10-CM | POA: Diagnosis present

## 2019-02-22 DIAGNOSIS — J9611 Chronic respiratory failure with hypoxia: Secondary | ICD-10-CM | POA: Diagnosis present

## 2019-02-22 DIAGNOSIS — D863 Sarcoidosis of skin: Secondary | ICD-10-CM | POA: Diagnosis present

## 2019-02-22 DIAGNOSIS — J9311 Primary spontaneous pneumothorax: Secondary | ICD-10-CM | POA: Diagnosis not present

## 2019-02-22 DIAGNOSIS — J9621 Acute and chronic respiratory failure with hypoxia: Secondary | ICD-10-CM | POA: Diagnosis present

## 2019-02-22 DIAGNOSIS — H209 Unspecified iridocyclitis: Secondary | ICD-10-CM | POA: Diagnosis present

## 2019-02-22 DIAGNOSIS — J939 Pneumothorax, unspecified: Secondary | ICD-10-CM | POA: Diagnosis present

## 2019-02-22 DIAGNOSIS — Z66 Do not resuscitate: Secondary | ICD-10-CM | POA: Diagnosis present

## 2019-02-22 DIAGNOSIS — Z4682 Encounter for fitting and adjustment of non-vascular catheter: Secondary | ICD-10-CM

## 2019-02-22 DIAGNOSIS — D860 Sarcoidosis of lung: Secondary | ICD-10-CM | POA: Diagnosis present

## 2019-02-22 DIAGNOSIS — D869 Sarcoidosis, unspecified: Secondary | ICD-10-CM | POA: Diagnosis not present

## 2019-02-22 DIAGNOSIS — R0902 Hypoxemia: Secondary | ICD-10-CM | POA: Diagnosis not present

## 2019-02-22 LAB — BASIC METABOLIC PANEL
Anion gap: 8 (ref 5–15)
BUN: 19 mg/dL (ref 8–23)
CO2: 28 mmol/L (ref 22–32)
Calcium: 9.6 mg/dL (ref 8.9–10.3)
Chloride: 100 mmol/L (ref 98–111)
Creatinine, Ser: 0.72 mg/dL (ref 0.44–1.00)
GFR calc Af Amer: >60 mL/min (ref >60)
GFR calc non Af Amer: >60 mL/min (ref >60)
Glucose, Bld: 112 mg/dL — ABNORMAL HIGH (ref 70–99)
Potassium: 3.5 mmol/L (ref 3.5–5.1)
Sodium: 136 mmol/L (ref 135–145)

## 2019-02-22 LAB — CBC
HCT: 44.1 % (ref 36.0–46.0)
Hemoglobin: 13.3 g/dL (ref 12.0–15.0)
MCH: 28 pg (ref 26.0–34.0)
MCHC: 30.2 g/dL (ref 30.0–36.0)
MCV: 92.8 fL (ref 80.0–100.0)
Platelets: 168 10*3/uL (ref 150–400)
RBC: 4.75 MIL/uL (ref 3.87–5.11)
RDW: 14 % (ref 11.5–15.5)
WBC: 5.4 10*3/uL (ref 4.0–10.5)
nRBC: 0 % (ref 0.0–0.2)

## 2019-02-22 LAB — HIV ANTIBODY (ROUTINE TESTING W REFLEX): HIV Screen 4th Generation wRfx: NONREACTIVE

## 2019-02-22 LAB — SARS CORONAVIRUS 2 (TAT 6-24 HRS): SARS Coronavirus 2: NEGATIVE

## 2019-02-22 LAB — PROTIME-INR
INR: 1.1 (ref 0.8–1.2)
Prothrombin Time: 13.6 seconds (ref 11.4–15.2)

## 2019-02-22 LAB — TROPONIN I (HIGH SENSITIVITY): Troponin I (High Sensitivity): 4 ng/L (ref <18)

## 2019-02-22 MED ORDER — FENTANYL CITRATE (PF) 100 MCG/2ML IJ SOLN
50.0000 ug | Freq: Once | INTRAMUSCULAR | Status: AC
  Administered 2019-02-22: 50 ug via INTRAVENOUS
  Filled 2019-02-22: qty 2

## 2019-02-22 MED ORDER — SODIUM CHLORIDE 0.9% FLUSH: 3.0000 mL | INTRAVENOUS | Status: DC | PRN

## 2019-02-22 MED ORDER — SODIUM CHLORIDE 0.9% FLUSH
3.0000 mL | Freq: Two times a day (BID) | INTRAVENOUS | Status: DC
  Administered 2019-02-24: 3 mL via INTRAVENOUS

## 2019-02-22 MED ORDER — ONDANSETRON HCL 4 MG/2ML IJ SOLN: 4.0000 mg | Freq: Four times a day (QID) | INTRAMUSCULAR | Status: DC | PRN

## 2019-02-22 MED ORDER — METHOTREXATE 2.5 MG PO TABS: 2.5000 mg | ORAL_TABLET | ORAL | Status: DC

## 2019-02-22 MED ORDER — SODIUM CHLORIDE 0.9 % IV SOLN: 250.0000 mL | INTRAVENOUS | Status: DC | PRN

## 2019-02-22 MED ORDER — SODIUM CHLORIDE 0.9 % IV SOLN: INTRAVENOUS | Status: DC

## 2019-02-22 MED ORDER — ONDANSETRON HCL 4 MG/2ML IJ SOLN
4.0000 mg | Freq: Once | INTRAMUSCULAR | Status: AC
  Administered 2019-02-22: 4 mg via INTRAVENOUS
  Filled 2019-02-22: qty 2

## 2019-02-22 MED ORDER — HYDROCHLOROTHIAZIDE 12.5 MG PO CAPS
12.5000 mg | ORAL_CAPSULE | Freq: Every day | ORAL | Status: DC
  Administered 2019-02-22 – 2019-02-25 (×4): 12.5 mg via ORAL
  Filled 2019-02-22 (×4): qty 1

## 2019-02-22 MED ORDER — ALBUTEROL SULFATE HFA 108 (90 BASE) MCG/ACT IN AERS
2.0000 | INHALATION_SPRAY | Freq: Four times a day (QID) | RESPIRATORY_TRACT | Status: DC | PRN
  Filled 2019-02-22: qty 6.7

## 2019-02-22 MED ORDER — HYDROCODONE-ACETAMINOPHEN 5-325 MG PO TABS
1.0000 | ORAL_TABLET | ORAL | Status: DC | PRN
  Administered 2019-02-22: 2 via ORAL
  Administered 2019-02-22: 1 via ORAL
  Administered 2019-02-22 (×2): 2 via ORAL
  Administered 2019-02-22: 1 via ORAL
  Administered 2019-02-23 – 2019-02-24 (×6): 2 via ORAL
  Filled 2019-02-22 (×5): qty 2
  Filled 2019-02-22 (×2): qty 1
  Filled 2019-02-22 (×4): qty 2
  Filled 2019-02-22: qty 1

## 2019-02-22 MED ORDER — FOLIC ACID 1 MG PO TABS
1.0000 mg | ORAL_TABLET | Freq: Every day | ORAL | Status: DC
  Administered 2019-02-22 – 2019-02-25 (×4): 1 mg via ORAL
  Filled 2019-02-22 (×4): qty 1

## 2019-02-22 MED ORDER — ONDANSETRON HCL 4 MG PO TABS: 4.0000 mg | ORAL_TABLET | Freq: Four times a day (QID) | ORAL | Status: DC | PRN

## 2019-02-22 MED ORDER — ALBUTEROL SULFATE (2.5 MG/3ML) 0.083% IN NEBU: 2.5000 mg | INHALATION_SOLUTION | Freq: Four times a day (QID) | RESPIRATORY_TRACT | Status: DC | PRN

## 2019-02-22 MED ORDER — SODIUM CHLORIDE 0.9% FLUSH
3.0000 mL | Freq: Once | INTRAVENOUS | Status: AC
  Administered 2019-02-22: 3 mL via INTRAVENOUS

## 2019-02-22 MED ORDER — MORPHINE SULFATE (PF) 2 MG/ML IV SOLN
2.0000 mg | INTRAVENOUS | Status: DC | PRN
  Administered 2019-02-23: 2 mg via INTRAVENOUS
  Filled 2019-02-22: qty 1

## 2019-02-22 MED ORDER — PREDNISOLONE ACETATE 1 % OP SUSP
1.0000 [drp] | Freq: Four times a day (QID) | OPHTHALMIC | Status: DC
  Administered 2019-02-22 – 2019-02-25 (×13): 1 [drp] via OPHTHALMIC
  Filled 2019-02-22 (×3): qty 5

## 2019-02-22 NOTE — ED Triage Notes (Signed)
Patient arrived with EMS from home reports SOB with chest tightness and productive cough onset this evening , denies fever or chills , history of sarcoidosis .

## 2019-02-22 NOTE — Consult Note (Signed)
Name: Lauren Massey MRN: [id number] DOB: 10-15-1955    ADMISSION DATE:  02/22/2019 CONSULTATION DATE: 02/22/2019  REFERRING MD : [facility]  CHIEF COMPLAINT: Dyspnea  BRIEF PATIENT DESCRIPTION: 64 year old female spontaneous right pneumothorax  SIGNIFICANT EVENTS    STUDIES:     HISTORY OF PRESENT ILLNESS:   64 year old never smoker who is followed by Dr. [facility] pulmonary clinic for known sarcoid 3 evaluate eyes and eye redness along with lung and skin.  Status post bronchoscopy 2000.  She is also noted failed Plaquenil for skin rash.  Her office spirometry is 08/06/2015 with-moderate restriction. FVC 1.53/63%, FEV1 1.19/62%, FEV1/FVC 0.78. She was recently evaluated by Dr. Annamaria Boots on 02/08/2019.  She is in her usual state of health when on 02/22/2011 she had been coughing paroxysms after waking up from a nap and noted acute shortness of breath. She presented to the emergency department found to have approximately 30% pneumothorax on the right.  Chest tube was inserted per the EDP with resolutions of pneumothorax.  She was admitted by the [facility] hospitalist service who have asked with help from pulmonary critical care in the management care of a chest tube.  PAST MEDICAL HISTORY :   has a past medical history of Cataract, Sarcoidosis, and Unspecified iridocyclitis.  has a past surgical history that includes Bronchoscopy (2003); Vesicovaginal fistula closure w/ TAH; Appendectomy; Abdominal hysterectomy; and Cataract extraction. Prior to Admission medications   Medication Sig Start Date End Date Taking? Authorizing Provider  albuterol (VENTOLIN HFA) 108 (90 Base) MCG/ACT inhaler Inhale 2 puffs into the lungs every 6 (six) hours as needed for wheezing or shortness of breath. 02/08/19  Yes Young, Tarri Fuller D, MD  folic acid (FOLVITE) 1 MG tablet Take 1 mg by mouth daily.   Yes [provider]  hydrochlorothiazide (MICROZIDE) 12.5 MG capsule Take 12.5 mg by mouth daily. 01/28/19  Yes  [provider]  methotrexate (RHEUMATREX) 2.5 MG tablet Take 12.5 mg by mouth once a week. Caution:Chemotherapy. Protect from light.  Pt takes 5 tablets every Wednesday.   Yes [provider]  prednisoLONE acetate (PRED FORTE) 1 % ophthalmic suspension Place 1 drop into the right eye 2 (two) times daily. Reported on 04/16/2015 Patient taking differently: Place 1 drop into the right eye 4 (four) times daily. Reported on 04/16/2015 04/16/15  Yes Robyn Haber, MD   No Known Allergies  FAMILY HISTORY:  family history includes Cancer in her father; Diabetes in an other family member; Heart disease in her father. SOCIAL HISTORY:  reports that she has never smoked. She has never used smokeless tobacco. She reports that she does not drink alcohol or use drugs.  REVIEW OF SYSTEMS:   10 point review of system taken, please see HPI for positives and negatives.   SUBJECTIVE:  64 year old female is resting in bed in no acute distress  VITAL SIGNS: Temp:  [98.2 F (36.8 C)] 98.2 F (36.8 C) (01/29 0235) Pulse Rate:  [65-87] 71 (01/29 0900) Resp:  [14-31] 17 (01/29 0900) BP: (108-153)/(70-93) 153/81 (01/29 0900) SpO2:  [98 %-100 %] 100 % (01/29 0900)  PHYSICAL EXAMINATION: General: Thin female no acute distress at bedrest Neuro: Grossly intact no focal defects appreciated HEENT: No JVD or lymphadenopathy is noted Cardiovascular: Heart sounds regular in rate and rhythm Lungs: Equal bilateral breath sounds are noted right pigtail catheter is in place with mild air leak noted 20 cm of suction Abdomen: Thin positive bowel sounds Musculoskeletal: Intact Skin: Warm and dry  Recent Labs  Lab 02/22/19 0248  NA 136  K 3.5  CL 100  CO2 28  BUN 19  CREATININE 0.72  GLUCOSE 112*   Recent Labs  Lab 02/22/19 0248  HGB 13.3  HCT 44.1  WBC 5.4  PLT 168   DG Chest 2 View  Result Date: 02/22/2019 CLINICAL DATA:  Chest tightness, shortness of breath EXAM: CHEST - 2 VIEW  COMPARISON:  02/15/2019 FINDINGS: There is a new right pneumothorax, approximately 30% in size. Heart is normal size. Chronic interstitial opacities within the lungs. No effusions. No acute bony abnormality. IMPRESSION: New moderate-sized right pneumothorax. Stable chronic interstitial prominence throughout the lungs. These results were called by telephone at the time of interpretation on 02/22/2019 at 3:08 am to provider Ripley Fraise , who verbally acknowledged these results. Electronically Signed   By: Rolm Baptise M.D.   On: 02/22/2019 03:09   DG Chest Portable 1 View  Result Date: 02/22/2019 CLINICAL DATA:  Post chest tube insertion EXAM: PORTABLE CHEST 1 VIEW COMPARISON:  Radiograph 02/22/2019 FINDINGS: Interval placement of a pigtail right pleural drain. Small amount of gas noted along the right chest wall. There is been interval decrease in the size of a right pneumothorax seen on comparison study without visible residual pleural gas at this time. Chronic interstitial and reticulonodular opacities similar to comparison radiograph. The cardiomediastinal contours are unremarkable. No acute osseous or soft tissue abnormality. Degenerative changes are present in the imaged spine and shoulders. IMPRESSION: 1. Interval placement of right pleural drain with decrease in size of right pneumothorax. No visible residual pleural gas at this time. 2. Small amount of subcutaneous emphysema at the chest tube insertion site. 3. Stable chronic interstitial and reticulonodular opacities. Electronically Signed   By: Lovena Le M.D.   On: 02/22/2019 05:23    ASSESSMENT: Active Problems:   SARCOIDOSIS, PULMONARY   Chronic respiratory failure with hypoxia (HCC)   Pneumothorax  Synopsis: 64 year old never smoker who is followed by Dr. [facility] pulmonary clinic for known sarcoid 3 evaluate eyes and eye redness along with lung and skin.  Status post bronchoscopy 2000.  She is also noted failed Plaquenil for skin  rash.  Her office spirometry is 08/06/2015 with-moderate restriction. FVC 1.53/63%, FEV1 1.19/62%, FEV1/FVC 0.78. She was recently evaluated by Dr. Annamaria Boots on 02/08/2019.  She is in her usual state of health when on 02/22/2011 she had been coughing paroxysms after waking up from a nap and noted acute shortness of breath. She presented to the emergency department found to have approximately 30% pneumothorax on the right.  Chest tube was inserted per the EDP with resolutions of pneumothorax.  She was admitted by the [facility] hospitalist service who have asked with help from pulmonary critical care in the management care of a chest tube.            PLAN: Continue chest tube on the right to 20 cm of suction x24 hours Attempt to place to waterseal within 24 hours and follow-up chest x-ray at usual intervals If lung remains inflated we will try clamping chest tube and if the lung remains inflated we can then attempt to remove chest tube. Noted underlying sarcoid will lead to higher than normal complications. Serial chest x-ray   Richardson Landry Jakeem Grape ACNP Acute Care Nurse Practitioner [place] Please consult Amion 02/22/2019, 12:55 PM

## 2019-02-22 NOTE — ED Provider Notes (Addendum)
TIME SEEN: 3:48 AM  CHIEF COMPLAINT: Sudden onset chest pain and shortness of breath  HPI: Patient is a 64 year old female with history of sarcoidosis on as needed oxygen who presents to the emergency department sudden onset throbbing central chest pain and shortness of breath that woke her from sleep around 1 AM.  Always has a dry cough.  Reports over the past several days has had some mucus production.  No fever.  No history of PE or DVT.  No lower extremity swelling or pain.  Patient on methotrexate for her sarcoidosis.  Followed by pulmonology.  Not on anticoagulation or antiplatelets.  Has never had a pneumothorax before.  No h/o trauma.  PCP - Pricilla Holm with [facility]  ROS: See HPI Constitutional: no fever  Eyes: no drainage  ENT: no runny nose   Cardiovascular:  chest pain  Resp:  SOB  GI: no vomiting GU: no dysuria Integumentary: no rash  Allergy: no hives  Musculoskeletal: no leg swelling  Neurological: no slurred speech ROS otherwise negative  PAST MEDICAL HISTORY/PAST SURGICAL HISTORY:  Past Medical History:  Diagnosis Date  . Cataract    right eye  . Sarcoidosis    skin, eye, lung  . Unspecified iridocyclitis     MEDICATIONS:  Prior to Admission medications   Medication Sig Start Date End Date Taking? Authorizing Provider  albuterol (VENTOLIN HFA) 108 (90 Base) MCG/ACT inhaler Inhale 2 puffs into the lungs every 6 (six) hours as needed for wheezing or shortness of breath. 02/08/19   Deneise Lever, MD  folic acid (FOLVITE) 1 MG tablet Take 1 mg by mouth daily.    [provider]  hydrochlorothiazide (MICROZIDE) 12.5 MG capsule Take 12.5 mg by mouth daily. 01/28/19   [provider]  methotrexate (RHEUMATREX) 2.5 MG tablet Take 2.5 mg by mouth once a week. Caution:Chemotherapy. Protect from light. Takes 10 tablets once a week    [provider]  prednisoLONE acetate (PRED FORTE) 1 % ophthalmic suspension Place 1 drop into the right  eye 2 (two) times daily. Reported on 04/16/2015 Patient taking differently: Place 1 drop into the right eye 4 (four) times daily. Reported on 04/16/2015 04/16/15   Robyn Haber, MD    ALLERGIES:  No Known Allergies  SOCIAL HISTORY:  Social History   Tobacco Use  . Smoking status: Never Smoker  . Smokeless tobacco: Never Used  Substance Use Topics  . Alcohol use: No    FAMILY HISTORY: Family History  Problem Relation Age of Onset  . Cancer Father   . Heart disease Father   . Diabetes Other   . Colon cancer Neg Hx     EXAM: BP (!) 142/79 (BP Location: Right Arm)   Pulse 76   Temp 98.2 F (36.8 C) (Oral)   Resp 16   SpO2 98%  CONSTITUTIONAL: Alert and oriented and responds appropriately to questions. Well-appearing; well-nourished HEAD: Normocephalic EYES: Conjunctivae clear, pupils appear equal, EOM appear intact ENT: normal nose; moist mucous membranes NECK: Supple, normal ROM CARD: RRR; S1 and S2 appreciated; no murmurs, no clicks, no rubs, no gallops RESP: Normal chest excursion without splinting or tachypnea; diminished breath sounds on the right, no wheezes, no rhonchi, no rales, no hypoxia or respiratory distress, speaking full sentences ABD/GI: Normal bowel sounds; non-distended; soft, non-tender, no rebound, no guarding, no peritoneal signs, no hepatosplenomegaly BACK:  The back appears normal EXT: Normal ROM in all joints; no deformity noted, no edema; no cyanosis SKIN: Normal color for  age and race; warm; no rash on exposed skin NEURO: Moves all extremities equally PSYCH: The patient's mood and manner are appropriate.   MEDICAL DECISION MAKING: Patient here with spontaneous pneumothorax.  Moderate in nature without signs of tension pneumothorax.  Will discuss with cardiothoracic surgery for admission.  Will place chest tube.  ED PROGRESS: 4:08 AM  Spoke with Dr. Orvan Seen with cardiothoracic surgery.  He agrees with me placing a pigtail catheter here in the  emergency department.  Recommends medicine admission afterwards.   5:28 AM  Discussed patient's case with hospitalist, Dr. Criss Alvine.  I have recommended admission and patient (and family if present) agree with this plan. Admitting physician will place admission orders.   I reviewed all nursing notes, vitals, pertinent previous records and interpreted all EKGs, lab and urine results, imaging (as available).     EKG Interpretation  Date/Time:  Friday February 22 2019 03:34:43 EST Ventricular Rate:  78 PR Interval:    QRS Duration: 79 QT Interval:  397 QTC Calculation: 453 R Axis:   28 Text Interpretation: Sinus rhythm Consider left ventricular hypertrophy No significant change since last tracing Confirmed by Pryor Curia [phone]) on 02/22/2019 3:38:48 AM      CHEST TUBE INSERTION  Date/Time: 02/22/2019 5:21 AM Performed by: Andranik Jeune, Delice Bison, DO Authorized by: Sedrick Tober, Delice Bison, DO   Consent:    Consent obtained:  Written   Consent given by:  Patient   Risks discussed:  Bleeding, incomplete drainage, nerve damage, pain, infection and damage to surrounding structures   Alternatives discussed:  Delayed treatment Pre-procedure details:    Skin preparation:  ChloraPrep   Preparation: Patient was prepped and draped in the usual sterile fashion   Anesthesia (see MAR for exact dosages):    Anesthesia method:  Local infiltration   Local anesthetic:  Lidocaine 1% w/o epi Procedure details:    Placement location:  R lateral   Scalpel size:  11   Tube size ([place]): 14 french pigtail.   Dissection instrument: dilator.   Ultrasound guidance: no     Tension pneumothorax: no     Tube connected to:  Water seal and suction   Drainage characteristics:  Air only   Suture material:  2-0 silk   Dressing:  4x4 sterile gauze Post-procedure details:    Post-insertion x-ray findings: tube in good position     Patient tolerance of procedure:  Tolerated well, no immediate  complications     CRITICAL CARE Performed by: Pryor Curia   Total critical care time: 45 minutes  Critical care time was exclusive of separately billable procedures and treating other patients.  Critical care was necessary to treat or prevent imminent or life-threatening deterioration.  Critical care was time spent personally by me on the following activities: development of treatment plan with patient and/or surrogate as well as nursing, discussions with consultants, evaluation of patient's response to treatment, examination of patient, obtaining history from patient or surrogate, ordering and performing treatments and interventions, ordering and review of laboratory studies, ordering and review of radiographic studies, pulse oximetry and re-evaluation of patient's condition.   EUDELIA CAHOON was evaluated in Emergency Department on 02/22/2019 for the symptoms described in the history of present illness. She was evaluated in the context of the global COVID-19 pandemic, which necessitated consideration that the patient might be at risk for infection with the SARS-CoV-2 virus that causes COVID-19. Institutional protocols and algorithms that pertain to the evaluation of patients at risk for COVID-19 are in  a state of rapid change based on information released by regulatory bodies including the CDC and federal and state organizations. These policies and algorithms were followed during the patient's care in the ED.  Patient was seen wearing N95, face shield, gloves.          Emali Heyward, Delice Bison, DO 02/22/19 [phone]

## 2019-02-22 NOTE — H&P (Signed)
History and Physical    Lauren Massey [id number] DOB: Jun 06, 1955 DOA: 02/22/2019  PCP: Hoyt Koch, MD Consultants:  Annamaria Boots- pulmonology; Manuella Ghazi - ophthalmology Patient coming from:  Home - lives with husband and grandson; NOK: Husband, [phone]  Chief Complaint: SOB/CP  HPI: Lauren Massey is a 64 y.o. female with medical history significant of iriocyclitis and sarcoidosis of the skin, eye, and lung presenting with SOB/CP.   She reports acute onset of chest pain.  She had SOB.  She thought she was having a heart attack.  She fell asleep on the couch last night and she woke up to the feeling that "something was beating me in my chest."  It was maybe 1230 this AM.  She tried to lie down but couldn't lie flat, started coughing and throwing up mucus.  She called 911.  By the time she got in the ambulance and was given oxygen, she felt a little better.  In the ER, they did a CXR and saw a collapsed lung.  They placed a chest tube - she is sore but does feel better.  It is uncomfortable.   ED Course: Spontaneous PTX, chest tube placed in ER  Review of Systems: As per HPI; otherwise review of systems reviewed and negative.   Ambulatory Status:  Ambulates without assistance    Past Medical History:  Diagnosis Date  . Cataract    right eye  . Sarcoidosis    skin, eye, lung  . Unspecified iridocyclitis     Past Surgical History:  Procedure Laterality Date  . ABDOMINAL HYSTERECTOMY    . APPENDECTOMY    . BRONCHOSCOPY  2003  . CATARACT EXTRACTION    . VESICOVAGINAL FISTULA CLOSURE W/ TAH      Social History   Socioeconomic History  . Marital status: Married    Spouse name: Not on file  . Number of children: 3  . Years of education: Not on file  . Highest education level: Not on file  Occupational History  . 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 Smoking status: Never Smoker  . Smokeless tobacco: Never Used  Substance and  Sexual Activity  . Alcohol use: No  . Drug use: No  . Sexual activity: Not on file  Other Topics Concern  . Not on file  Social History Narrative  . Not on file   Social Determinants of Health   Financial Resource Strain:   . Difficulty of Paying Living Expenses: Not on file  Food Insecurity:   . Worried About Charity fundraiser in the Last Year: Not on file  . Ran Out of Food in the Last Year: Not on file  Transportation Needs:   . Lack of Transportation (Medical): Not on file  . Lack of Transportation (Non-Medical): Not on file  Physical Activity:   . Days of Exercise per Week: Not on file  . Minutes of Exercise per Session: Not on file  Stress:   . Feeling of Stress : Not on file  Social Connections:   . Frequency of Communication with Friends and Family: Not on file  . Frequency of Social Gatherings with Friends and Family: Not on file  . Attends Religious Services: Not on file  . Active Member of Clubs or Organizations: Not on file  . Attends Archivist Meetings: Not on file  . Marital Status: Not on file  Intimate Partner Violence:   . Fear of Current  or Ex-Partner: Not on file  . Emotionally Abused: Not on file  . Physically Abused: Not on file  . Sexually Abused: Not on file    No Known Allergies  Family History  Problem Relation Age of Onset  . Cancer Father   . Heart disease Father   . Diabetes Other   . Colon cancer Neg Hx     Prior to Admission medications   Medication Sig Start Date End Date Taking? Authorizing Provider  albuterol (VENTOLIN HFA) 108 (90 Base) MCG/ACT inhaler Inhale 2 puffs into the lungs every 6 (six) hours as needed for wheezing or shortness of breath. 02/08/19   Deneise Lever, MD  folic acid (FOLVITE) 1 MG tablet Take 1 mg by mouth daily.    [provider]  hydrochlorothiazide (MICROZIDE) 12.5 MG capsule Take 12.5 mg by mouth daily. 01/28/19   [provider]  methotrexate (RHEUMATREX) 2.5 MG tablet  Take 2.5 mg by mouth once a week. Caution:Chemotherapy. Protect from light. Takes 10 tablets once a week    [provider]  prednisoLONE acetate (PRED FORTE) 1 % ophthalmic suspension Place 1 drop into the right eye 2 (two) times daily. Reported on 04/16/2015 Patient taking differently: Place 1 drop into the right eye 4 (four) times daily. Reported on 04/16/2015 04/16/15   Robyn Haber, MD    Physical Exam: Vitals:   02/22/19 0600 02/22/19 0630 02/22/19 0715 02/22/19 0900  BP: 108/70 114/70 133/78 (!) 153/81  Pulse: 69 65  71  Resp: 18 14 (!) 21 17  Temp:      TempSrc:      SpO2: 100% 100%  100%     . General:  Appears calm and comfortable and is NAD . Eyes:  PERRL, EOMI, normal lids, iris . ENT:  grossly normal hearing, lips & tongue, mmm . Neck:  no LAD, masses or thyromegaly . Cardiovascular:  RRR, no m/r/g. No LE edema.  Marland Kitchen Respiratory:   CTA bilaterally with no wheezes/rales/rhonchi.  Normal respiratory effort.  R-sided chest tube is in place. . Abdomen:  soft, NT, ND, NABS . Skin:  no rash or induration seen on limited exam . Musculoskeletal:  grossly normal tone BUE/BLE, good ROM, no bony abnormality . Lower extremity:  No LE edema.  Limited foot exam with no ulcerations.  2+ distal pulses.  [employer] Marland Kitchen Psychiatric:  grossly normal mood and affect, speech fluent and appropriate, AOx3 . Neurologic:  CN 2-12 grossly intact, moves all extremities in coordinated fashion, sensation intact    Radiological Exams on Admission: DG Chest 2 View  Result Date: 02/22/2019 CLINICAL DATA:  Chest tightness, shortness of breath EXAM: CHEST - 2 VIEW COMPARISON:  02/15/2019 FINDINGS: There is a new right pneumothorax, approximately 30% in size. Heart is normal size. Chronic interstitial opacities within the lungs. No effusions. No acute bony abnormality. IMPRESSION: New moderate-sized right pneumothorax. Stable chronic interstitial prominence throughout the lungs. These results  were called by telephone at the time of interpretation on 02/22/2019 at 3:08 am to provider Ripley Fraise , who verbally acknowledged these results. Electronically Signed   By: Rolm Baptise M.D.   On: 02/22/2019 03:09   DG Chest Portable 1 View  Result Date: 02/22/2019 CLINICAL DATA:  Post chest tube insertion EXAM: PORTABLE CHEST 1 VIEW COMPARISON:  Radiograph 02/22/2019 FINDINGS: Interval placement of a pigtail right pleural drain. Small amount of gas noted along the right chest wall. There is been interval decrease in the size of a  right pneumothorax seen on comparison study without visible residual pleural gas at this time. Chronic interstitial and reticulonodular opacities similar to comparison radiograph. The cardiomediastinal contours are unremarkable. No acute osseous or soft tissue abnormality. Degenerative changes are present in the imaged spine and shoulders. IMPRESSION: 1. Interval placement of right pleural drain with decrease in size of right pneumothorax. No visible residual pleural gas at this time. 2. Small amount of subcutaneous emphysema at the chest tube insertion site. 3. Stable chronic interstitial and reticulonodular opacities. Electronically Signed   By: Lovena Le M.D.   On: 02/22/2019 05:23    EKG: Independently reviewed.  NSR with rate 78; nonspecific ST changes with no evidence of acute ischemia   Labs on Admission: I have personally reviewed the available labs and imaging studies at the time of the admission.  Pertinent labs:   Glucose 112 HS troponin 4 Normal CBC INR 1.1 COVID negative HIV negative   Assessment/Plan Principal Problem:   Pneumothorax Active Problems:   SARCOIDOSIS, PULMONARY   Iridocyclitis   Chronic respiratory failure with hypoxia (HCC)   Spontaneous pneumothorax -Patient with acute onset of CP and SOB overnight while sleeping -She was unable to relieve symptoms and so eventually called 911 -CXR showed a moderate right-sided  PTX -Chest tube was placed in the ER with relief of the pain and SOB, although she is appropriately sore at that site -[facility] consult requested for chest tube management -The tube is to suction at this time and will transition to water seal and then clamping prior to d/c -The patient will be admitted to inpatient status, as she is anticipated to require multiple days of hospitalization -Pain control with Norco/morphine  Sarcoidosis with chronic respiratory failure -She is followed by Dr. Annamaria Boots, last seen on 1/15 -She is on once-weekly methotrexate, on Wednesdays; will hold for now -Continue folate -She is listed as also using Trelegy, but this does not appear on her home medication list at this time -Not using O2 routinely -CXR with chronic severe changes a/w ILD  Iridocyclitis -She has frequent eye care with an extensive h/o ophthalmologic complications associated with her sarcoidosis -She was last seen by Dr. Manuella Ghazi on 08/10/18 -Continue Pred [place] - although this is not listed on her current treatment list by Dr. Manuella Ghazi -Her methotrexate also treats this issue, but she may need adjunctive treatment -She was due to f/u in Sept 2020 -Since she is late to f/u, Dr. Manuella Ghazi has not agreed to refill her methotrexate     Note: This patient has been tested and is negative for the novel coronavirus COVID-19.  DVT prophylaxis: SCDs Code Status:  DNR - confirmed with patient Family Communication: None present Disposition Plan:  Home once clinically improved Consults called:  CT surgery (telephone only); Pulmonology  Admission status: Admit - It is my clinical opinion that admission to INPATIENT is reasonable and necessary because of the expectation that this patient will require hospital care that crosses at least 2 midnights to treat this condition based on the medical complexity of the problems presented.  Given the aforementioned information, the predictability of an adverse outcome is felt to be  significant.    Karmen Bongo MD [facility]   How to contact the [facility] Attending or Consulting provider [place] or covering provider during after hours [place], for this patient?  1. Check the care team in [facility] and look for a) attending/consulting TR[facility] provider listed and b) the [facility] team listed 2. Log into [email or web address]  and use [place]'s universal password to access. If you do not have the password, please contact the hospital operator. 3. Locate the [facility] provider you are looking for under [facility] and page to a number that you can be directly reached. 4. If you still have difficulty reaching the provider, please page the [facility] (Director on Call) for the Hospitalists listed on amion for assistance.   02/22/2019, 1:36 PM

## 2019-02-22 NOTE — Plan of Care (Signed)
  Problem: Pain Managment: Goal: General experience of comfort will improve Outcome: Progressing   Problem: Safety: Goal: Ability to remain free from injury will improve Outcome: Progressing   Problem: Skin Integrity: Goal: Risk for impaired skin integrity will decrease Outcome: Progressing   

## 2019-02-22 NOTE — ED Notes (Signed)
Spoke with daughter and update given.

## 2019-02-23 ENCOUNTER — Inpatient Hospital Stay (HOSPITAL_COMMUNITY): Payer: 59

## 2019-02-23 DIAGNOSIS — J9311 Primary spontaneous pneumothorax: Secondary | ICD-10-CM

## 2019-02-23 DIAGNOSIS — D860 Sarcoidosis of lung: Secondary | ICD-10-CM

## 2019-02-23 NOTE — Progress Notes (Signed)
PROGRESS NOTE  Lauren Massey [id number] DOB: 1955-09-02 DOA: 02/22/2019 PCP: Hoyt Koch, MD  HPI/Recap of past 24 hours: Per admitting [id number] C Lauren Massey is a 64 y.o. female with medical history significant of iriocyclitis and sarcoidosis of the skin, eye, and lung presenting with SOB/CP.   She reports acute onset of chest pain.  She had SOB.  She thought she was having a heart attack.  She fell asleep on the couch last night and she woke up to the feeling that "something was beating me in my chest."  It was maybe 1230 this AM.  She tried to lie down but couldn't lie flat, started coughing and throwing up mucus.  She called 911.  By the time she got in the ambulance and was given oxygen, she felt a little better.  In the ER, they did a CXR and saw a collapsed lung.  They placed a chest tube - she is sore but does feel better.  It is uncomfortable.  Subjective: Patient seen at bedside with her daughter Andee Poles in the room.  Patient denies any complaints Assessment/Plan: Principal Problem:   Pneumothorax Active Problems:   SARCOIDOSIS, PULMONARY   Iridocyclitis   Chronic respiratory failure with hypoxia (HCC)   Spontaneous pneumothorax  1.  Spontaneous pneumothorax.  Chest x-ray showed right-sided spontaneous pneumothorax chest tube was placed in the emergency department with relief of pain and shortness of breath pulmonary consult was placed for chest tube management  2.  Sarcoidosis with chronic respiratory failure She is on once weekly methotrexate patient does not require oxygen  3.  Iridocyclitis patient sees Dr. Manuella Ghazi her ophthalmology will continue Pred Forte continue methotrexate  Code Status: DNR  Severity of Illness: The appropriate patient status for this patient is INPATIENT. Inpatient status is judged to be reasonable and necessary in order to provide the required intensity of service to ensure the patient's safety. The patient's presenting symptoms,  physical exam findings, and initial radiographic and laboratory data in the context of their chronic comorbidities is felt to place them at high risk for further clinical deterioration. Furthermore, it is not anticipated that the patient will be medically stable for discharge from the hospital within 2 midnights of admission. The following factors support the patient status of inpatient.   " The patient's presenting symptoms include spontaneous pneumothorax. " The worrisome physical exam findings include spontaneous pneumothorax. " The initial radiographic and laboratory data are worrisome because of pneumothorax with chest tube. " The chronic co-morbidities include pneumothorax with chest tube.   * I certify that at the point of admission it is my clinical judgment that the patient will require inpatient hospital care spanning beyond 2 midnights from the point of admission due to high intensity of service, high risk for further deterioration and high frequency of surveillance required.*    Family Communication: Daughter at bedside her name is Asuka  Disposition Plan: Home when chest tube is out   Consultants:  Critical care  Procedures:  Thoracentesis  Antimicrobials:  None  DVT prophylaxis: SCD   Objective: Vitals:   02/22/19 0900 02/22/19 1606 02/22/19 1954 02/23/19 0448  BP: (!) 153/81 127/77 (!) 142/73 136/75  Pulse: 71 81 67 65  Resp: 17 16 18 17   Temp:  98.4 F (36.9 C) 98.6 F (37 C) 98.2 F (36.8 C)  TempSrc:  Axillary Oral Oral  SpO2: 100% 100% 100% 100%    Intake/Output Summary (Last 24 hours) at 02/23/2019 [id number] Last data filed at  02/23/2019 0900 Gross per 24 hour  Intake 2299.92 ml  Output 330 ml  Net 1969.92 ml   There were no vitals filed for this visit. There is no height or weight on file to calculate BMI.  Exam:  . General: 64 y.o. year-old female well developed well nourished in no acute distress.  Alert and oriented x3. . Cardiovascular:  Regular rate and rhythm with no rubs or gallops.  No thyromegaly or JVD noted.   Marland Kitchen Respiratory: Clear to auscultation with no wheezes or rales. Good inspiratory effort. . Abdomen: Soft nontender nondistended with normal bowel sounds x4 quadrants. . Musculoskeletal: No lower extremity edema. 2/4 pulses in all 4 extremities. . Skin: No ulcerative lesions noted or rashes, . Psychiatry: Mood is appropriate for condition and setting    Data Reviewed: CBC: Recent Labs  Lab 02/22/19 0248  WBC 5.4  HGB 13.3  HCT 44.1  MCV 92.8  PLT [id number]   Basic Metabolic Panel: Recent Labs  Lab 02/22/19 0248  NA 136  K 3.5  CL 100  CO2 28  GLUCOSE 112*  BUN 19  CREATININE 0.72  CALCIUM 9.6   GFR: CrCl cannot be calculated (Unknown ideal weight.). Liver Function Tests: No results for input(s): AST, ALT, ALKPHOS, BILITOT, PROT, ALBUMIN in the last 168 hours. No results for input(s): LIPASE, AMYLASE in the last 168 hours. No results for input(s): AMMONIA in the last 168 hours. Coagulation Profile: Recent Labs  Lab 02/22/19 0248  INR 1.1   Cardiac Enzymes: No results for input(s): CKTOTAL, CKMB, CKMBINDEX, TROPONINI in the last 168 hours. BNP (last 3 results) No results for input(s): PROBNP in the last 8760 hours. HbA1C: No results for input(s): HGBA1C in the last 72 hours. CBG: No results for input(s): GLUCAP in the last 168 hours. Lipid Profile: No results for input(s): CHOL, HDL, LDLCALC, TRIG, CHOLHDL, LDLDIRECT in the last 72 hours. Thyroid Function Tests: No results for input(s): TSH, T4TOTAL, FREET4, T3FREE, THYROIDAB in the last 72 hours. Anemia Panel: No results for input(s): VITAMINB12, FOLATE, FERRITIN, TIBC, IRON, RETICCTPCT in the last 72 hours. Urine analysis:    Component Value Date/Time   COLORURINE AMBER (A) 04/24/2015 1116   APPEARANCEUR CLOUDY (A) 04/24/2015 1116   LABSPEC 1.016 04/24/2015 1116   PHURINE 6.5 04/24/2015 1116   GLUCOSEU NEGATIVE 04/24/2015 1116    HGBUR TRACE (A) 04/24/2015 1116   BILIRUBINUR negative 11/02/2017 1047   KETONESUR NEGATIVE 04/24/2015 1116   PROTEINUR Negative 11/02/2017 1047   PROTEINUR 30 (A) 04/24/2015 1116   UROBILINOGEN 0.2 11/02/2017 1047   NITRITE negative 11/02/2017 1047   NITRITE NEGATIVE 04/24/2015 1116   LEUKOCYTESUR Large (3+) (A) 11/02/2017 1047   Sepsis Labs: @LABRCNTIP (procalcitonin:4,lacticidven:4)  ) Recent Results (from the past 240 hour(s))  SARS CORONAVIRUS 2 (TAT 6-24 HRS) Nasopharyngeal Nasopharyngeal Swab     Status: None   Collection Time: 02/22/19  4:08 AM   Specimen: Nasopharyngeal Swab  Result Value Ref Range Status   SARS Coronavirus 2 NEGATIVE NEGATIVE Final    Comment: (NOTE) SARS-CoV-2 target nucleic acids are NOT DETECTED. The SARS-CoV-2 RNA is generally detectable in upper and lower respiratory specimens during the acute phase of infection. Negative results do not preclude SARS-CoV-2 infection, do not rule out co-infections with other pathogens, and should not be used as the sole basis for treatment or other patient management decisions. Negative results must be combined with clinical observations, patient history, and epidemiological information. The expected result is Negative. Fact Sheet for Patients: SugarRoll.be  Fact Sheet for Healthcare Providers: https://www.woods-mathews.com/ This test is not yet approved or cleared by the [place] FDA and  has been authorized for detection and/or diagnosis of SARS-CoV-2 by FDA under an Emergency Use Authorization (EUA). This EUA will remain  in effect (meaning this test can be used) for the duration of the COVID-19 declaration under Section 56 4(b)(1) of the Act, 21 U.S.C. section 360bbb-3(b)(1), unless the authorization is terminated or revoked sooner. Performed at [facility], [place] [address], [place], [place] [address]       Studies: DG Chest Port 1 View  Result Date:  02/23/2019 CLINICAL DATA:  Possible discharge. EXAM: PORTABLE CHEST 1 VIEW COMPARISON:  Chest radiograph 02/22/2019 FINDINGS: Monitoring leads overlie the patient. Right chest tube remains in place. Stable cardiac and mediastinal contours. Similar-appearing diffuse bilateral coarse interstitial opacities. Small right apical pneumothorax. Thoracic spine degenerative changes. IMPRESSION: There is a small right apical pneumothorax. Right chest tube remains in position. Similar-appearing diffuse bilateral coarse interstitial opacities. Electronically Signed   By: Lovey Newcomer M.D.   On: 02/23/2019 06:32    Scheduled Meds: . folic acid  1 mg Oral Daily  . hydrochlorothiazide  12.5 mg Oral Daily  . prednisoLONE acetate  1 drop Right Eye QID  . sodium chloride flush  3 mL Intravenous Q12H    Continuous Infusions: . sodium chloride    . sodium chloride 75 mL/hr at 02/22/19 2057     LOS: 1 day     Cristal Deer, MD [facility]  To reach me or the doctor on call, go to: [email or web address] Password [email or web address]  02/23/2019, 9:37 AM

## 2019-02-23 NOTE — Progress Notes (Signed)
Name: PIPER MARCIAL MRN: [id number] DOB: 1955-11-15    ADMISSION DATE:  02/22/2019 CONSULTATION DATE: 02/22/2019  REFERRING MD : [facility]  CHIEF COMPLAINT: Dyspnea  BRIEF PATIENT DESCRIPTION: 64 year old female spontaneous right pneumothorax  SIGNIFICANT EVENTS   STUDIES:   HISTORY OF PRESENT ILLNESS:   64 year old never smoker who is followed by Dr. [facility]ur[facility] pulmonary clinic for known sarcoid 3 evaluate eyes and eye redness along with lung and skin.  Status post bronchoscopy 2000.  She is also noted failed Plaquenil for skin rash.  Her office spirometry is 08/06/2015 with-moderate restriction. FVC 1.53/63%, FEV1 1.19/62%, FEV1/FVC 0.78. She was recently evaluated by Dr. Annamaria Boots on 02/08/2019.  She is in her usual state of health when on 02/22/2011 she had been coughing paroxysms after waking up from a nap and noted acute shortness of breath. She presented to the emergency department found to have approximately 30% pneumothorax on the right.  Chest tube was inserted per the EDP with resolutions of pneumothorax.  She was admitted by the [facility] hospitalist service who have asked with help from pulmonary critical care in the management care of a chest tube.  PAST MEDICAL HISTORY :   has a past medical history of Cataract, Sarcoidosis, and Unspecified iridocyclitis.  has a past surgical history that includes Bronchoscopy (2003); Vesicovaginal fistula closure w/ TAH; Appendectomy; Abdominal hysterectomy; and Cataract extraction.  SUBJECTIVE:  Comfort. Resting in bed. Some pain at tube insertion site.   VITAL SIGNS: Temp:  [98.2 F (36.8 C)-99.1 F (37.3 C)] 99.1 F (37.3 C) (01/30 1352) Pulse Rate:  [65-81] 70 (01/30 1352) Resp:  [16-18] 18 (01/30 1352) BP: (127-163)/(73-83) 163/83 (01/30 1352) SpO2:  [100 %] 100 % (01/30 1352)  PHYSICAL EXAMINATION: General appearance: 64 y.o., female, NAD, conversant  Eyes: anicteric sclerae, PERRLA HENT: NCAT; oropharynx, MMM Neck: Trachea  midline;  no JVD Lungs: CTAB, no crackles, no wheeze. Chest: catheter c/d/i CV: RRR, S1, S2, no MRGs  Abdomen: Soft, non-tender; non-distended, BS present  Extremities: No peripheral edema Psych: Appropriate affect Neuro: Alert and oriented to person and place, no focal deficit     Recent Labs  Lab 02/22/19 0248  NA 136  K 3.5  CL 100  CO2 28  BUN 19  CREATININE 0.72  GLUCOSE 112*   Recent Labs  Lab 02/22/19 0248  HGB 13.3  HCT 44.1  WBC 5.4  PLT 168   DG Chest 2 View  Result Date: 02/22/2019 CLINICAL DATA:  Chest tightness, shortness of breath EXAM: CHEST - 2 VIEW COMPARISON:  02/15/2019 FINDINGS: There is a new right pneumothorax, approximately 30% in size. Heart is normal size. Chronic interstitial opacities within the lungs. No effusions. No acute bony abnormality. IMPRESSION: New moderate-sized right pneumothorax. Stable chronic interstitial prominence throughout the lungs. These results were called by telephone at the time of interpretation on 02/22/2019 at 3:08 am to provider Ripley Fraise , who verbally acknowledged these results. Electronically Signed   By: Rolm Baptise M.D.   On: 02/22/2019 03:09   DG Chest Port 1 View  Result Date: 02/23/2019 CLINICAL DATA:  Possible discharge. EXAM: PORTABLE CHEST 1 VIEW COMPARISON:  Chest radiograph 02/22/2019 FINDINGS: Monitoring leads overlie the patient. Right chest tube remains in place. Stable cardiac and mediastinal contours. Similar-appearing diffuse bilateral coarse interstitial opacities. Small right apical pneumothorax. Thoracic spine degenerative changes. IMPRESSION: There is a small right apical pneumothorax. Right chest tube remains in position. Similar-appearing diffuse bilateral coarse interstitial opacities. Electronically Signed   By: Dian Situ  Rosana Hoes M.D.   On: 02/23/2019 06:32   DG Chest Portable 1 View  Result Date: 02/22/2019 CLINICAL DATA:  Post chest tube insertion EXAM: PORTABLE CHEST 1 VIEW COMPARISON:   Radiograph 02/22/2019 FINDINGS: Interval placement of a pigtail right pleural drain. Small amount of gas noted along the right chest wall. There is been interval decrease in the size of a right pneumothorax seen on comparison study without visible residual pleural gas at this time. Chronic interstitial and reticulonodular opacities similar to comparison radiograph. The cardiomediastinal contours are unremarkable. No acute osseous or soft tissue abnormality. Degenerative changes are present in the imaged spine and shoulders. IMPRESSION: 1. Interval placement of right pleural drain with decrease in size of right pneumothorax. No visible residual pleural gas at this time. 2. Small amount of subcutaneous emphysema at the chest tube insertion site. 3. Stable chronic interstitial and reticulonodular opacities. Electronically Signed   By: Lovena Le M.D.   On: 02/22/2019 05:23    ASSESSMENT: Principal Problem:   Pneumothorax Active Problems:   SARCOIDOSIS, PULMONARY   Iridocyclitis   Chronic respiratory failure with hypoxia (HCC)   Spontaneous pneumothorax  Discussion: Secondary pneumothorax in the setting of chronic fibrotic sarcoidosis  cxr 02/23/2019 reviewed, small right apical ptx. The patient's images have been independently reviewed by me.   Small, single bubble expiratory leak  PLAN: waterseal today  Repeat CXR in AM  Possible removal of tube tomorrow pending cxr  Discussed with RN if the patient develops CP, SOB or desaturations can get stat CXR to evaluate for worsening ptx If patient is HD unstable immediately reconnect to suction   Pulmonary will follow. Please call with any questions.   Garner Nash, DO [facility] 02/23/2019 2:16 PM

## 2019-02-23 NOTE — Plan of Care (Signed)

## 2019-02-24 ENCOUNTER — Inpatient Hospital Stay (HOSPITAL_COMMUNITY): Payer: 59

## 2019-02-24 DIAGNOSIS — J9383 Other pneumothorax: Principal | ICD-10-CM

## 2019-02-24 DIAGNOSIS — R0902 Hypoxemia: Secondary | ICD-10-CM

## 2019-02-24 DIAGNOSIS — D869 Sarcoidosis, unspecified: Secondary | ICD-10-CM

## 2019-02-24 NOTE — Plan of Care (Signed)

## 2019-02-24 NOTE — Progress Notes (Addendum)
Name: Lauren Massey MRN: [id number] DOB: 1955-07-29    ADMISSION DATE:  02/22/2019 CONSULTATION DATE: 02/22/2019  REFERRING MD : [facility]  CHIEF COMPLAINT: Dyspnea  BRIEF PATIENT DESCRIPTION: 64 year old female spontaneous right pneumothorax  SIGNIFICANT EVENTS   STUDIES:   HISTORY OF PRESENT ILLNESS:   64 year old never smoker who is followed by Dr. [facility]ur[facility] pulmonary clinic for known sarcoid 3 evaluate eyes and eye redness along with lung and skin.  Status post bronchoscopy 2000.  She is also noted failed Plaquenil for skin rash.  Her office spirometry is 08/06/2015 with-moderate restriction. FVC 1.53/63%, FEV1 1.19/62%, FEV1/FVC 0.78. She was recently evaluated by Dr. Annamaria Boots on 02/08/2019.  She is in her usual state of health when on 02/22/2011 she had been coughing paroxysms after waking up from a nap and noted acute shortness of breath. She presented to the emergency department found to have approximately 30% pneumothorax on the right.  Chest tube was inserted per the EDP with resolutions of pneumothorax.  She was admitted by the [facility] hospitalist service who have asked with help from pulmonary critical care in the management care of a chest tube.  PAST MEDICAL HISTORY :   has a past medical history of Cataract, Sarcoidosis, and Unspecified iridocyclitis.  has a past surgical history that includes Bronchoscopy (2003); Vesicovaginal fistula closure w/ TAH; Appendectomy; Abdominal hysterectomy; and Cataract extraction.  SUBJECTIVE:  Sitting up in bed in no acute distress, reports minimal chest pain at insertion site.   VITAL SIGNS: Temp:  [99.1 F (37.3 C)-100.7 F (38.2 C)] 100.7 F (38.2 C) (01/31 0459) Pulse Rate:  [64-73] 73 (01/31 0459) Resp:  [16-18] 18 (01/31 0459) BP: (136-167)/(68-83) 167/78 (01/31 0459) SpO2:  [99 %-100 %] 99 % (01/31 0459)  PHYSICAL EXAMINATION: General: Chronically ill appearing elderly female sitting in bedside recliner in NAD HEENT: [place]/AT, MM  pink/moist, PERRL,  Neuro: Alert and oriented x3, non-focal  CV: s1s2 regular rate and rhythm, no murmur, rubs, or gallops,  PULM:  Clear to ascultation bilaterally  GI: soft, bowel sounds active in all 4 quadrants, non-tender, non-distended Extremities: warm/dry, no edema  Skin: no rashes or lesions    Recent Labs  Lab 02/22/19 0248  NA 136  K 3.5  CL 100  CO2 28  BUN 19  CREATININE 0.72  GLUCOSE 112*   Recent Labs  Lab 02/22/19 0248  HGB 13.3  HCT 44.1  WBC 5.4  PLT 168   DG CHEST PORT 1 VIEW  Result Date: 02/24/2019 CLINICAL DATA:  Patient with history of pneumothorax EXAM: PORTABLE CHEST 1 VIEW COMPARISON:  Chest radiograph 02/23/2019 FINDINGS: Monitoring leads overlie the patient. Stable enlarged cardiac and mediastinal contours. Right chest tube remains in place. Similar-appearing diffuse bilateral airspace opacities. Mild interval decrease in size of tiny right apical pneumothorax. Thoracic spine degenerative changes. IMPRESSION: Slight interval decrease in size of small right apical pneumothorax. Right chest tube remains in place. Persistent diffuse bilateral airspace opacities. Electronically Signed   By: Lovey Newcomer M.D.   On: 02/24/2019 07:15   DG Chest Port 1 View  Result Date: 02/23/2019 CLINICAL DATA:  Possible discharge. EXAM: PORTABLE CHEST 1 VIEW COMPARISON:  Chest radiograph 02/22/2019 FINDINGS: Monitoring leads overlie the patient. Right chest tube remains in place. Stable cardiac and mediastinal contours. Similar-appearing diffuse bilateral coarse interstitial opacities. Small right apical pneumothorax. Thoracic spine degenerative changes. IMPRESSION: There is a small right apical pneumothorax. Right chest tube remains in position. Similar-appearing diffuse bilateral coarse interstitial opacities. Electronically Signed  By: Lovey Newcomer M.D.   On: 02/23/2019 06:32    ASSESSMENT: Principal Problem:   Pneumothorax Active Problems:   SARCOIDOSIS, PULMONARY    Iridocyclitis   Chronic respiratory failure with hypoxia (HCC)   Spontaneous pneumothorax  Discussion: Secondary pneumothorax in the setting of chronic fibrotic sarcoidosis. CXR 02/23/2019 reviewed, small right apical ptx. The patient's images have been independently reviewed by me.   No air leak seen on exam today   PLAN: CXR remains stable after transition to waterseal  Remove Chest tube today  Follow CXR later this afternoon  Encouraged frequent pulmonary hygiene  Encourage mobilization   Johnsie Cancel, NP-C [facility] Contact / Pager information can be found on Amion  02/24/2019, 10:14 AM  Attending Note:  64 year old female female with history of sarcoidosis who presents to PCCM with a spontaneous PTX.  CT was placed.  Patient has been on water seal with no airleak on exam and decreased BS diffusely.  I reviewed CXR myself, CT is in a good position.  Discussed with PCCM-NP.    PTX:  - Remove CT  - CXR in AM  Sarcoidosis:  - MTX  - Prednisone  - Please arrange for f/u with Dr. Annamaria Boots upon discharge  Hypoxemia:  - Titrate O2 for sat of 88-92%  - May need an ambulatory desaturation study prior to discharge to determine home O2 need  PCCM will f/u on CXR in AM  Patient seen and examined, agree with above note.  I dictated the care and orders written for this patient under my direction.  Rush Farmer, M.D. [facility].

## 2019-02-24 NOTE — Progress Notes (Signed)
PROGRESS NOTE  Lauren Massey [id number] DOB: Jun 14, 1955 DOA: 02/22/2019 PCP: Lauren Koch, MD  HPI/Recap of past 24 hours: Per admitting [id number] Lauren Massey is a 64 y.o. female with medical history significant of iriocyclitis and sarcoidosis of the skin, eye, and lung presenting with SOB/CP.   She reports acute onset of chest pain.  She had SOB.  She thought she was having a heart attack.  She fell asleep on the couch last night and she woke up to the feeling that "something was beating me in my chest."  It was maybe 1230 this AM.  She tried to lie down but couldn't lie flat, started coughing and throwing up mucus.  She called 911.  By the time she got in the ambulance and was given oxygen, she felt a little better.  In the ER, they did a CXR and saw a collapsed lung.  They placed a chest tube - she is sore but does feel better.  It is uncomfortable.  Subjective: Patient seen at bedside with her daughter Lauren Massey in the room.  Patient denies any complaints  February 24, 2019. Subjective: Patient seen and examined at bedside.  She had a temperature of 100.7 last night.  She stated she feels fine. PCCM is managing her chest tube they think it might be removed today.  Assessment/Plan: Principal Problem:   Pneumothorax Active Problems:   SARCOIDOSIS, PULMONARY   Iridocyclitis   Chronic respiratory failure with hypoxia (HCC)   Spontaneous pneumothorax  1.  Spontaneous pneumothorax.  Chest x-ray showed right-sided spontaneous pneumothorax chest tube was placed in the emergency department with relief of pain and shortness of breath pulmonary consult was placed for chest tube management  2.  Sarcoidosis with chronic respiratory failure She is on once weekly methotrexate patient does not require oxygen  3.  Iridocyclitis patient sees Dr. Manuella Massey her ophthalmology will continue Pred Forte continue methotrexate  Code Status: DNR  Severity of Illness: The appropriate patient  status for this patient is INPATIENT. Inpatient status is judged to be reasonable and necessary in order to provide the required intensity of service to ensure the patient's safety. The patient's presenting symptoms, physical exam findings, and initial radiographic and laboratory data in the context of their chronic comorbidities is felt to place them at high risk for further clinical deterioration. Furthermore, it is not anticipated that the patient will be medically stable for discharge from the hospital within 2 midnights of admission. The following factors support the patient status of inpatient.   " The patient's presenting symptoms include spontaneous pneumothorax. " The worrisome physical exam findings include spontaneous pneumothorax. " The initial radiographic and laboratory data are worrisome because of pneumothorax with chest tube. " The chronic co-morbidities include pneumothorax with chest tube.   * I certify that at the point of admission it is my clinical judgment that the patient will require inpatient hospital care spanning beyond 2 midnights from the point of admission due to high intensity of service, high risk for further deterioration and high frequency of surveillance required.*    Family Communication: Daughter at bedside her name is Lauren Massey  Disposition Plan: Home when chest tube is out   Consultants:  Critical care  Procedures:  Thoracentesis  Antimicrobials:  None  DVT prophylaxis: SCD   Objective: Vitals:   02/23/19 0448 02/23/19 1352 02/23/19 2056 02/24/19 0459  BP: 136/75 (!) 163/83 136/68 (!) 167/78  Pulse: 65 70 64 73  Resp: 17 18 16 18   Temp:  98.2 F (36.8 Lauren) 99.1 F (37.3 Lauren) 99.3 F (37.4 Lauren) (!) 100.7 F (38.2 Lauren)  TempSrc: Oral Oral Oral Oral  SpO2: 100% 100% 100% 99%    Intake/Output Summary (Last 24 hours) at 02/24/2019 0925 Last data filed at 02/24/2019 0700 Gross per 24 hour  Intake 2396.96 ml  Output 1060 ml  Net 1336.96 ml    There were no vitals filed for this visit. There is no height or weight on file to calculate BMI.  Exam:  . General: 64 y.o. year-old female well developed well nourished in no acute distress.  Alert and oriented x3. . Cardiovascular: Regular rate and rhythm with no rubs or gallops.  No thyromegaly or JVD noted.   Marland Kitchen Respiratory: Clear to auscultation with no wheezes or rales. Good inspiratory effort. . Abdomen: Soft nontender nondistended with normal bowel sounds x4 quadrants. . Musculoskeletal: No lower extremity edema. 2/4 pulses in all 4 extremities. . Skin: No ulcerative lesions noted or rashes, . Psychiatry: Mood is appropriate for condition and setting    Data Reviewed: CBC: Recent Labs  Lab 02/22/19 0248  WBC 5.4  HGB 13.3  HCT 44.1  MCV 92.8  PLT [id number]   Basic Metabolic Panel: Recent Labs  Lab 02/22/19 0248  NA 136  K 3.5  CL 100  CO2 28  GLUCOSE 112*  BUN 19  CREATININE 0.72  CALCIUM 9.6   GFR: CrCl cannot be calculated (Unknown ideal weight.). Liver Function Tests: No results for input(s): AST, ALT, ALKPHOS, BILITOT, PROT, ALBUMIN in the last 168 hours. No results for input(s): LIPASE, AMYLASE in the last 168 hours. No results for input(s): AMMONIA in the last 168 hours. Coagulation Profile: Recent Labs  Lab 02/22/19 0248  INR 1.1   Cardiac Enzymes: No results for input(s): CKTOTAL, CKMB, CKMBINDEX, TROPONINI in the last 168 hours. BNP (last 3 results) No results for input(s): PROBNP in the last 8760 hours. HbA1C: No results for input(s): HGBA1C in the last 72 hours. CBG: No results for input(s): GLUCAP in the last 168 hours. Lipid Profile: No results for input(s): CHOL, HDL, LDLCALC, TRIG, CHOLHDL, LDLDIRECT in the last 72 hours. Thyroid Function Tests: No results for input(s): TSH, T4TOTAL, FREET4, T3FREE, THYROIDAB in the last 72 hours. Anemia Panel: No results for input(s): VITAMINB12, FOLATE, FERRITIN, TIBC, IRON, RETICCTPCT in the last 72  hours. Urine analysis:    Component Value Date/Time   COLORURINE AMBER (A) 04/24/2015 1116   APPEARANCEUR CLOUDY (A) 04/24/2015 1116   LABSPEC 1.016 04/24/2015 1116   PHURINE 6.5 04/24/2015 1116   GLUCOSEU NEGATIVE 04/24/2015 1116   HGBUR TRACE (A) 04/24/2015 1116   BILIRUBINUR negative 11/02/2017 1047   KETONESUR NEGATIVE 04/24/2015 1116   PROTEINUR Negative 11/02/2017 1047   PROTEINUR 30 (A) 04/24/2015 1116   UROBILINOGEN 0.2 11/02/2017 1047   NITRITE negative 11/02/2017 1047   NITRITE NEGATIVE 04/24/2015 1116   LEUKOCYTESUR Large (3+) (A) 11/02/2017 1047   Sepsis Labs: @LABRCNTIP (procalcitonin:4,lacticidven:4)  ) Recent Results (from the past 240 hour(s))  SARS CORONAVIRUS 2 (TAT 6-24 HRS) Nasopharyngeal Nasopharyngeal Swab     Status: None   Collection Time: 02/22/19  4:08 AM   Specimen: Nasopharyngeal Swab  Result Value Ref Range Status   SARS Coronavirus 2 NEGATIVE NEGATIVE Final    Comment: (NOTE) SARS-CoV-2 target nucleic acids are NOT DETECTED. The SARS-CoV-2 RNA is generally detectable in upper and lower respiratory specimens during the acute phase of infection. Negative results do not preclude SARS-CoV-2 infection, do not rule  out co-infections with other pathogens, and should not be used as the sole basis for treatment or other patient management decisions. Negative results must be combined with clinical observations, patient history, and epidemiological information. The expected result is Negative. Fact Sheet for Patients: SugarRoll.be Fact Sheet for Healthcare Providers: https://www.woods-mathews.com/ This test is not yet approved or cleared by the [place] FDA and  has been authorized for detection and/or diagnosis of SARS-CoV-2 by FDA under an Emergency Use Authorization (EUA). This EUA will remain  in effect (meaning this test can be used) for the duration of the COVID-19 declaration under Section 56 4(b)(1)  of the Act, 21 [place] section 360bbb-3(b)(1), unless the authorization is terminated or revoked sooner. Performed at [facility], [place] [address], [place], [place] [address]       Studies: DG CHEST PORT 1 VIEW  Result Date: 02/24/2019 CLINICAL DATA:  Patient with history of pneumothorax EXAM: PORTABLE CHEST 1 VIEW COMPARISON:  Chest radiograph 02/23/2019 FINDINGS: Monitoring leads overlie the patient. Stable enlarged cardiac and mediastinal contours. Right chest tube remains in place. Similar-appearing diffuse bilateral airspace opacities. Mild interval decrease in size of tiny right apical pneumothorax. Thoracic spine degenerative changes. IMPRESSION: Slight interval decrease in size of small right apical pneumothorax. Right chest tube remains in place. Persistent diffuse bilateral airspace opacities. Electronically Signed   By: Lovey Newcomer M.D.   On: 02/24/2019 07:15    Scheduled Meds: . folic acid  1 mg Oral Daily  . hydrochlorothiazide  12.5 mg Oral Daily  . prednisoLONE acetate  1 drop Right Eye QID  . sodium chloride flush  3 mL Intravenous Q12H    Continuous Infusions: . sodium chloride    . sodium chloride 75 mL/hr at 02/23/19 2205     LOS: 2 days     Cristal Deer, MD [facility]  To reach me or the doctor on call, go to: [email or web address] Password [email or web address]  02/24/2019, 9:25 AM

## 2019-02-25 ENCOUNTER — Encounter (HOSPITAL_COMMUNITY): Payer: Self-pay | Admitting: Surgery

## 2019-02-25 ENCOUNTER — Encounter (HOSPITAL_COMMUNITY): Payer: Self-pay

## 2019-02-25 ENCOUNTER — Inpatient Hospital Stay (HOSPITAL_COMMUNITY): Payer: 59

## 2019-02-25 DIAGNOSIS — J930 Spontaneous tension pneumothorax: Secondary | ICD-10-CM

## 2019-02-25 MED ORDER — WHITE PETROLATUM EX OINT
TOPICAL_OINTMENT | CUTANEOUS | Status: AC
  Filled 2019-02-25: qty 28.35

## 2019-02-25 NOTE — Discharge Summary (Signed)
Physician Discharge Summary  Lauren Massey [id number] DOB: 1955-03-09 DOA: 02/22/2019  PCP: Hoyt Koch, MD  Admit date: 02/22/2019 Discharge date: 02/25/2019  Admitted From: Home Disposition: Home  Recommendations for Outpatient Follow-up:  1. Follow up with PCP in 1-2 weeks 2. Follow-up with pulmonologist in 1 to 2 weeks 3. Please obtain BMP/CBC in one week 4. Please follow up on the following pending results:   Instructions: Irritable prohibited for foreseeable future. Home Health: None Equipment/Devices: None  Discharge Condition: Stable CODE STATUS: Full code Diet recommendation: Regular  Subjective: Seen and examined.  Patient is already dressed up and ready to go home.  She has no complaints.  Brief/Interim Summary: Lauren Massey a 64 y.o.femalewith medical history significant ofiriocyclitis and sarcoidosis of the skin, eye, and lung presented to ED with acute SOB/CP.She called 911. By the time she got in the ambulance and was given oxygen, she felt a little better. In the ER, she was hemodynamically stable.  Chest x-ray was done which showed spontaneous pneumothorax.  Chest tube was placed.  She was admitted to hospital service.  PCCM was consulted.  Subsequently her chest tube was removed yesterday.  Her chest x-ray was repeated today which shows stable but minimal right upper lobe pneumothorax.  Patient has no symptoms of chest pain or shortness of breath and PCCM has cleared the patient for discharge.  She is being discharged in stable condition.  Patient is aware that she is at risk of having recurrent pneumothorax if she were to have excessive cough again due to her history of sarcoidosis.  I advised her to keep cough drops handy with her at all times and use them when she feels like her cough is going to be excessive.  She verbalized understanding.  Discharge Diagnoses:  Principal Problem:   Pneumothorax Active Problems:   SARCOIDOSIS,  PULMONARY   Iridocyclitis   Chronic respiratory failure with hypoxia (HCC)   Spontaneous pneumothorax    Discharge Instructions  Discharge Instructions    Discharge patient   Complete by: As directed    Discharge disposition: 01-Home or Self Care   Discharge patient date: 02/25/2019     Allergies as of 02/25/2019   No Known Allergies     Medication List    TAKE these medications   albuterol 108 (90 Base) MCG/ACT inhaler Commonly known as: VENTOLIN HFA Inhale 2 puffs into the lungs every 6 (six) hours as needed for wheezing or shortness of breath.   folic acid 1 MG tablet Commonly known as: FOLVITE Take 1 mg by mouth daily.   hydrochlorothiazide 12.5 MG capsule Commonly known as: MICROZIDE Take 12.5 mg by mouth daily.   methotrexate 2.5 MG tablet Commonly known as: RHEUMATREX Take 12.5 mg by mouth once a week. Caution:Chemotherapy. Protect from light.  Pt takes 5 tablets every Wednesday.   prednisoLONE acetate 1 % ophthalmic suspension Commonly known as: PRED FORTE Place 1 drop into the right eye 2 (two) times daily. Reported on 04/16/2015 What changed: when to take this      Follow-up Information    Hoyt Koch, MD Follow up in 1 week(s).   Specialty: Internal Medicine Contact information: [place] [address] [phone]          No Known Allergies  Consultations: PCCM and IR   Procedures/Studies: DG Chest 2 View  Result Date: 02/22/2019 CLINICAL DATA:  Chest tightness, shortness of breath EXAM: CHEST - 2 VIEW COMPARISON:  02/15/2019 FINDINGS: There  is a new right pneumothorax, approximately 30% in size. Heart is normal size. Chronic interstitial opacities within the lungs. No effusions. No acute bony abnormality. IMPRESSION: New moderate-sized right pneumothorax. Stable chronic interstitial prominence throughout the lungs. These results were called by telephone at the time of interpretation on 02/22/2019 at 3:08 am to  provider Ripley Fraise , who verbally acknowledged these results. Electronically Signed   By: Rolm Baptise M.D.   On: 02/22/2019 03:09   DG Chest 2 View  Result Date: 02/15/2019 CLINICAL DATA:  64 year old female with subcu doses. Follow-up radiograph. EXAM: CHEST - 2 VIEW COMPARISON:  Chest radiograph dated 02/08/2019. FINDINGS: There is diffuse interstitial coarsening and chronic bronchitic changes. Right upper lobe linear and streaky densities, likely chronic and sequela of known subcu doses. Superimposed infection is not entirely excluded. Clinical correlation is recommended. The previously seen right apical pneumothorax is not visualized on today's exam. There is no pleural effusion. Stable cardiac silhouette. Atherosclerotic calcification of the aorta. No acute osseous pathology. IMPRESSION: 1. Interval resolution of the previously seen small right apical pneumothorax. 2. Chronic changes of sarcoidosis. Mild increased density in the right upper lobe may be chronic although developing infiltrate is not excluded. Clinical correlation is recommended. Electronically Signed   By: Anner Crete M.D.   On: 02/15/2019 15:29   DG Chest 2 View  Result Date: 02/08/2019 CLINICAL DATA:  64 year old female with history of sarcoidosis. Follow-up study. EXAM: CHEST - 2 VIEW COMPARISON:  Chest x-ray 01/31/2018. FINDINGS: Lung volumes are normal. Widespread reticulonodular opacities are again noted throughout the lungs bilaterally, very similar to the prior study. Small right apical pneumothorax, new compared to the prior examination. No left pneumothorax. No definite acute consolidative airspace disease. No pleural effusions. No evidence of pulmonary edema. Heart size is normal. Upper mediastinal contours are within normal limits. Aortic atherosclerosis. IMPRESSION: 1. Small right apical pneumothorax occupying 5-10% of the volume of the right hemithorax. 2. Imaging stigmata of sarcoidosis in the lungs, similar to  the prior study, as detailed above. Critical Value/emergent results were called by telephone at the time of interpretation on 02/08/2019 at 3:10 pm to [place], who verbally acknowledged these results. Electronically Signed   By: Vinnie Langton M.D.   On: 02/08/2019 15:10   DG Chest Port 1 View  Result Date: 02/25/2019 CLINICAL DATA:  Pneumothorax follow-up EXAM: PORTABLE CHEST 1 VIEW COMPARISON:  Yesterday FINDINGS: A trace right apical pneumothorax is subtly visualized. Reticular opacities in the bilateral lungs. There is history of sarcoid. Normal heart size and stable mediastinal contours. There is chronic hilar adenopathy. IMPRESSION: Slight residual right apical pneumothorax without change from yesterday. Electronically Signed   By: Monte Fantasia M.D.   On: 02/25/2019 08:03   DG CHEST PORT 1 VIEW  Result Date: 02/24/2019 CLINICAL DATA:  64 year old female chest tube removal. History of pneumothorax. Sarcoidosis, spontaneous pneumothorax. EXAM: PORTABLE CHEST 1 VIEW COMPARISON:  0626 hours today and earlier. FINDINGS: Portable AP upright view at 1559 hours. Right chest tube removed. Small right apical residual pneumothorax appears stable. There is also pleural edge still visible at the lateral 3/4 rib level. Underlying chronic lung disease. Stable cardiac size and mediastinal contours. Stable trachea. Negative visible bowel gas pattern. No acute osseous abnormality identified. IMPRESSION: 1. Right chest tube removed with stable small residual right pneumothorax. 2. Chronic lung disease.  No new cardiopulmonary abnormality. Electronically Signed   By: Genevie Ann M.D.   On: 02/24/2019 16:12   DG CHEST PORT 1  VIEW  Result Date: 02/24/2019 CLINICAL DATA:  Patient with history of pneumothorax EXAM: PORTABLE CHEST 1 VIEW COMPARISON:  Chest radiograph 02/23/2019 FINDINGS: Monitoring leads overlie the patient. Stable enlarged cardiac and mediastinal contours. Right chest tube remains in place.  Similar-appearing diffuse bilateral airspace opacities. Mild interval decrease in size of tiny right apical pneumothorax. Thoracic spine degenerative changes. IMPRESSION: Slight interval decrease in size of small right apical pneumothorax. Right chest tube remains in place. Persistent diffuse bilateral airspace opacities. Electronically Signed   By: Lovey Newcomer M.D.   On: 02/24/2019 07:15   DG Chest Port 1 View  Result Date: 02/23/2019 CLINICAL DATA:  Possible discharge. EXAM: PORTABLE CHEST 1 VIEW COMPARISON:  Chest radiograph 02/22/2019 FINDINGS: Monitoring leads overlie the patient. Right chest tube remains in place. Stable cardiac and mediastinal contours. Similar-appearing diffuse bilateral coarse interstitial opacities. Small right apical pneumothorax. Thoracic spine degenerative changes. IMPRESSION: There is a small right apical pneumothorax. Right chest tube remains in position. Similar-appearing diffuse bilateral coarse interstitial opacities. Electronically Signed   By: Lovey Newcomer M.D.   On: 02/23/2019 06:32   DG Chest Portable 1 View  Result Date: 02/22/2019 CLINICAL DATA:  Post chest tube insertion EXAM: PORTABLE CHEST 1 VIEW COMPARISON:  Radiograph 02/22/2019 FINDINGS: Interval placement of a pigtail right pleural drain. Small amount of gas noted along the right chest wall. There is been interval decrease in the size of a right pneumothorax seen on comparison study without visible residual pleural gas at this time. Chronic interstitial and reticulonodular opacities similar to comparison radiograph. The cardiomediastinal contours are unremarkable. No acute osseous or soft tissue abnormality. Degenerative changes are present in the imaged spine and shoulders. IMPRESSION: 1. Interval placement of right pleural drain with decrease in size of right pneumothorax. No visible residual pleural gas at this time. 2. Small amount of subcutaneous emphysema at the chest tube insertion site. 3. Stable chronic  interstitial and reticulonodular opacities. Electronically Signed   By: Lovena Le M.D.   On: 02/22/2019 05:23      Discharge Exam: Vitals:   02/24/19 2116 02/25/19 0518  BP: (!) 145/75 135/76  Pulse: 78 67  Resp: 17 18  Temp: 98.3 F (36.8 C) 98.2 F (36.8 C)  SpO2: 92% 92%   Vitals:   02/24/19 0459 02/24/19 1534 02/24/19 2116 02/25/19 0518  BP: (!) 167/78 (!) 161/86 (!) 145/75 135/76  Pulse: 73 80 78 67  Resp: 18 19 17 18   Temp: (!) 100.7 F (38.2 C) 98.3 F (36.8 C) 98.3 F (36.8 C) 98.2 F (36.8 C)  TempSrc: Oral Oral Oral Oral  SpO2: 99% 94% 92% 92%    General: Pt is alert, awake, not in acute distress Cardiovascular: RRR, S1/S2 +, no rubs, no gallops Respiratory: CTA bilaterally, no wheezing, no rhonchi Abdominal: Soft, NT, ND, bowel sounds + Extremities: no edema, no cyanosis    The results of significant diagnostics from this hospitalization (including imaging, microbiology, ancillary and laboratory) are listed below for reference.     Microbiology: Recent Results (from the past 240 hour(s))  SARS CORONAVIRUS 2 (TAT 6-24 HRS) Nasopharyngeal Nasopharyngeal Swab     Status: None   Collection Time: 02/22/19  4:08 AM   Specimen: Nasopharyngeal Swab  Result Value Ref Range Status   SARS Coronavirus 2 NEGATIVE NEGATIVE Final    Comment: (NOTE) SARS-CoV-2 target nucleic acids are NOT DETECTED. The SARS-CoV-2 RNA is generally detectable in upper and lower respiratory specimens during the acute phase of infection. Negative results  do not preclude SARS-CoV-2 infection, do not rule out co-infections with other pathogens, and should not be used as the sole basis for treatment or other patient management decisions. Negative results must be combined with clinical observations, patient history, and epidemiological information. The expected result is Negative. Fact Sheet for Patients: SugarRoll.be Fact Sheet for Healthcare  Providers: https://www.woods-mathews.com/ This test is not yet approved or cleared by the [place] FDA and  has been authorized for detection and/or diagnosis of SARS-CoV-2 by FDA under an Emergency Use Authorization (EUA). This EUA will remain  in effect (meaning this test can be used) for the duration of the COVID-19 declaration under Section 56 4(b)(1) of the Act, 21 U.S.C. section 360bbb-3(b)(1), unless the authorization is terminated or revoked sooner. Performed at [facility], [place] [address] [place], [place] [address]      Labs: BNP (last 3 results) No results for input(s): BNP in the last 8760 hours. Basic Metabolic Panel: Recent Labs  Lab 02/22/19 0248  NA 136  K 3.5  CL 100  CO2 28  GLUCOSE 112*  BUN 19  CREATININE 0.72  CALCIUM 9.6   Liver Function Tests: No results for input(s): AST, ALT, ALKPHOS, BILITOT, PROT, ALBUMIN in the last 168 hours. No results for input(s): LIPASE, AMYLASE in the last 168 hours. No results for input(s): AMMONIA in the last 168 hours. CBC: Recent Labs  Lab 02/22/19 0248  WBC 5.4  HGB 13.3  HCT 44.1  MCV 92.8  PLT 168   Cardiac Enzymes: No results for input(s): CKTOTAL, CKMB, CKMBINDEX, TROPONINI in the last 168 hours. BNP: Invalid input(s): POCBNP CBG: No results for input(s): GLUCAP in the last 168 hours. D-Dimer No results for input(s): DDIMER in the last 72 hours. Hgb A1c No results for input(s): HGBA1C in the last 72 hours. Lipid Profile No results for input(s): CHOL, HDL, LDLCALC, TRIG, CHOLHDL, LDLDIRECT in the last 72 hours. Thyroid function studies No results for input(s): TSH, T4TOTAL, T3FREE, THYROIDAB in the last 72 hours.  Invalid input(s): FREET3 Anemia work up No results for input(s): VITAMINB12, FOLATE, FERRITIN, TIBC, IRON, RETICCTPCT in the last 72 hours. Urinalysis    Component Value Date/Time   COLORURINE AMBER (A) 04/24/2015 1116   APPEARANCEUR CLOUDY (A) 04/24/2015 1116    LABSPEC 1.016 04/24/2015 1116   PHURINE 6.5 04/24/2015 1116   GLUCOSEU NEGATIVE 04/24/2015 1116   HGBUR TRACE (A) 04/24/2015 1116   BILIRUBINUR negative 11/02/2017 1047   KETONESUR NEGATIVE 04/24/2015 1116   PROTEINUR Negative 11/02/2017 1047   PROTEINUR 30 (A) 04/24/2015 1116   UROBILINOGEN 0.2 11/02/2017 1047   NITRITE negative 11/02/2017 1047   NITRITE NEGATIVE 04/24/2015 1116   LEUKOCYTESUR Large (3+) (A) 11/02/2017 1047   Sepsis Labs Invalid input(s): PROCALCITONIN,  WBC,  LACTICIDVEN Microbiology Recent Results (from the past 240 hour(s))  SARS CORONAVIRUS 2 (TAT 6-24 HRS) Nasopharyngeal Nasopharyngeal Swab     Status: None   Collection Time: 02/22/19  4:08 AM   Specimen: Nasopharyngeal Swab  Result Value Ref Range Status   SARS Coronavirus 2 NEGATIVE NEGATIVE Final    Comment: (NOTE) SARS-CoV-2 target nucleic acids are NOT DETECTED. The SARS-CoV-2 RNA is generally detectable in upper and lower respiratory specimens during the acute phase of infection. Negative results do not preclude SARS-CoV-2 infection, do not rule out co-infections with other pathogens, and should not be used as the sole basis for treatment or other patient management decisions. Negative results must be combined with clinical observations, patient history,  and epidemiological information. The expected result is Negative. Fact Sheet for Patients: SugarRoll.be Fact Sheet for Healthcare Providers: https://www.woods-mathews.com/ This test is not yet approved or cleared by the [place] FDA and  has been authorized for detection and/or diagnosis of SARS-CoV-2 by FDA under an Emergency Use Authorization (EUA). This EUA will remain  in effect (meaning this test can be used) for the duration of the COVID-19 declaration under Section 56 4(b)(1) of the Act, 21 U.S.C. section 360bbb-3(b)(1), unless the authorization is terminated or revoked sooner. Performed at  [facility], [place] [address] [place], [place] [address]      Time coordinating discharge: Over 30 minutes  SIGNED:   Darliss Cheney, MD  [facility] 02/25/2019, 9:19 AM  If 7PM-7AM, please contact night-coverage [email or web address] Password [email or web address]

## 2019-02-25 NOTE — Progress Notes (Signed)
CXR stable, okay to go home from my standpoint, discussed with Dr. Doristine Bosworth.

## 2019-02-25 NOTE — Progress Notes (Signed)
Discharged patient to home. AVS instructions given and explained. Concerns addressed and questions answered to patient's satisfaction. Belongings returned accordingly.

## 2019-02-25 NOTE — Progress Notes (Signed)
Name: Lauren Massey MRN: [id number] DOB: 1955/06/23    ADMISSION DATE:  02/22/2019 CONSULTATION DATE: 02/22/2019  REFERRING MD : [facility]  CHIEF COMPLAINT: Dyspnea  BRIEF PATIENT DESCRIPTION: 64 year old female spontaneous right pneumothorax  SIGNIFICANT EVENTS   STUDIES:   HISTORY OF PRESENT ILLNESS:   64 year old never smoker who is followed by Dr. Keturah Barre pulmonary clinic for known sarcoid 3 evaluate eyes and eye redness along with lung and skin.  Status post bronchoscopy 2000.  She is also noted failed Plaquenil for skin rash.  Her office spirometry is 08/06/2015 with-moderate restriction. FVC 1.53/63%, FEV1 1.19/62%, FEV1/FVC 0.78. She was recently evaluated by Dr. Annamaria Boots on 02/08/2019.  She is in her usual state of health when on 02/22/2011 she had been coughing paroxysms after waking up from a nap and noted acute shortness of breath. She presented to the emergency department found to have approximately 30% pneumothorax on the right.  Chest tube was inserted per the EDP with resolutions of pneumothorax.  She was admitted by the [facility] hospitalist service who have asked with help from pulmonary critical care in the management care of a chest tube.  PAST MEDICAL HISTORY :   has a past medical history of Cataract, Sarcoidosis, and Unspecified iridocyclitis.  has a past surgical history that includes Bronchoscopy (2003); Vesicovaginal fistula closure w/ TAH; Appendectomy; Abdominal hysterectomy; and Cataract extraction.  SUBJECTIVE:  Sitting up in bed in no acute distress, reports minimal chest pain at insertion site.   VITAL SIGNS: Temp:  [98.2 F (36.8 C)-98.3 F (36.8 C)] 98.2 F (36.8 C) (02/01 0518) Pulse Rate:  [67-80] 67 (02/01 0518) Resp:  [17-19] 18 (02/01 0518) BP: (135-161)/(75-86) 135/76 (02/01 0518) SpO2:  [92 %-94 %] 92 % (02/01 0518)  PHYSICAL EXAMINATION: GEN: no acute distress, walking around in room HEENT: MMM, trachea midline CV: RRR, ext warm PULM:  Scattered wheezing, no accessory muscle use GI: Soft, +BS EXT: No edema NEURO: Walking normally PSYCH: AOx3, good insight SKIN: No rashes     Recent Labs  Lab 02/22/19 0248  NA 136  K 3.5  CL 100  CO2 28  BUN 19  CREATININE 0.72  GLUCOSE 112*   Recent Labs  Lab 02/22/19 0248  HGB 13.3  HCT 44.1  WBC 5.4  PLT 168   DG CHEST PORT 1 VIEW  Result Date: 02/24/2019 CLINICAL DATA:  64 year old female chest tube removal. History of pneumothorax. Sarcoidosis, spontaneous pneumothorax. EXAM: PORTABLE CHEST 1 VIEW COMPARISON:  0626 hours today and earlier. FINDINGS: Portable AP upright view at 1559 hours. Right chest tube removed. Small right apical residual pneumothorax appears stable. There is also pleural edge still visible at the lateral 3/4 rib level. Underlying chronic lung disease. Stable cardiac size and mediastinal contours. Stable trachea. Negative visible bowel gas pattern. No acute osseous abnormality identified. IMPRESSION: 1. Right chest tube removed with stable small residual right pneumothorax. 2. Chronic lung disease.  No new cardiopulmonary abnormality. Electronically Signed   By: Genevie Ann M.D.   On: 02/24/2019 16:12   DG CHEST PORT 1 VIEW  Result Date: 02/24/2019 CLINICAL DATA:  Patient with history of pneumothorax EXAM: PORTABLE CHEST 1 VIEW COMPARISON:  Chest radiograph 02/23/2019 FINDINGS: Monitoring leads overlie the patient. Stable enlarged cardiac and mediastinal contours. Right chest tube remains in place. Similar-appearing diffuse bilateral airspace opacities. Mild interval decrease in size of tiny right apical pneumothorax. Thoracic spine degenerative changes. IMPRESSION: Slight interval decrease in size of small right apical pneumothorax. Right chest tube  remains in place. Persistent diffuse bilateral airspace opacities. Electronically Signed   By: Lovey Newcomer M.D.   On: 02/24/2019 07:15    ASSESSMENT: Principal Problem:   Pneumothorax Active Problems:    SARCOIDOSIS, PULMONARY   Iridocyclitis   Chronic respiratory failure with hypoxia (HCC)   Spontaneous pneumothorax  Discussion: Chronic fibrotic sarcoidosis complicated by right pneumothorax s/p chest tube.  Lungs up yesterday, today's CXR pending  PLAN: -Check CXR, if looks okay fine for discharge, will arrange f/u appt next week in clinic -Encouraged mobility, if feels limited can do walking pulse ox and home O2  -Air travel prohibited for forseeable future -Resume MTX on DC - If recurrence would do talc chest tube pleurodesis vs. VATS  Call if questions/concerns  Erskine Emery MD PCCM

## 2019-02-25 NOTE — Discharge Instructions (Signed)
Pneumothorax A pneumothorax is commonly called a collapsed lung. It is a condition in which air leaks from a lung and builds up between the thin layer of tissue that covers the lungs (visceral pleura) and the interior wall of the chest cavity (parietal pleura). The air gets trapped outside the lung, between the lung and the chest wall (pleural space). The air takes up space and prevents the lung from fully expanding. This condition sometimes occurs suddenly with no apparent cause. The buildup of air may be small or large. A small pneumothorax may go away on its own. A large pneumothorax will require treatment and hospitalization. What are the causes? This condition may be caused by:  Trauma and injury to the chest wall.  Surgery and other medical procedures.  A complication of an underlying lung problem, especially chronic obstructive pulmonary disease (COPD) or emphysema. Sometimes the cause of this condition is not known. What increases the risk? You are more likely to develop this condition if:  You have an underlying lung problem.  You smoke.  You are 20-40 years old, female, tall, and underweight.  You have a personal or family history of pneumothorax.  You have an eating disorder (anorexia nervosa). This condition can also happen quickly, even in people with no history of lung problems. What are the signs or symptoms? Sometimes a pneumothorax will have no symptoms. When symptoms are present, they can include:  Chest pain.  Shortness of breath.  Increased rate of breathing.  Bluish color to your lips or skin (cyanosis). How is this diagnosed? This condition may be diagnosed by:  A medical history and physical exam.  A chest X-ray, chest CT scan, or ultrasound. How is this treated? Treatment depends on how severe your condition is. The goal of treatment is to remove the extra air and allow your lung to expand back to its normal size.  For a small pneumothorax: ? No  treatment may be needed. ? Extra oxygen is sometimes used to make it go away more quickly.  For a large pneumothorax or a pneumothorax that is causing symptoms, a procedure is done to drain the air from your lungs. To do this, a health care provider may use: ? A needle with a syringe. This is used to suck air from a pleural space where no additional leakage is taking place. ? A chest tube. This is used to suck air where there is ongoing leakage into the pleural space. The chest tube may need to remain in place for several days until the air leak has healed.  In more severe cases, surgery may be needed to repair the damage that is causing the leak.  If you have multiple pneumothorax episodes or have an air leak that will not heal, a procedure called a pleurodesis may be done. A medicine is placed in the pleural space to irritate the tissues around the lung so that the lung will stick to the chest wall, seal any leaks, and stop any buildup of air in that space. If you have an underlying lung problem, severe symptoms, or a large pneumothorax you will usually need to stay in the hospital. Follow these instructions at home: Lifestyle  Do not use any products that contain nicotine or tobacco, such as cigarettes and e-cigarettes. These are major risk factors in pneumothorax. If you need help quitting, ask your health care provider.  Do not lift anything that is heavier than 10 lb (4.5 kg), or the limit that your health care   provider tells you, until he or she says that it is safe.  Avoid activities that take a lot of effort (strenuous) for as long as told by your health care provider.  Return to your normal activities as told by your health care provider. Ask your health care provider what activities are safe for you.  Do not fly in an airplane or scuba dive until your health care provider says it is okay. General instructions  Take over-the-counter and prescription medicines only as told by your  health care provider.  If a cough or pain makes it difficult for you to sleep at night, try sleeping in a semi-upright position in a recliner or by using 2 or 3 pillows.  If you had a chest tube and it was removed, ask your health care provider when you can remove the bandage (dressing). While the dressing is in place, do not allow it to get wet.  Keep all follow-up visits as told by your health care provider. This is important. Contact a health care provider if:  You cough up thick mucus (sputum) that is yellow or green in color.  You were treated with a chest tube, and you have redness, increasing pain, or discharge at the site where it was placed. Get help right away if:  You have increasing chest pain or shortness of breath.  You have a cough that will not go away.  You begin coughing up blood.  You have pain that is getting worse or is not controlled with medicines.  The site where your chest tube was located opens up.  You feel air coming out of the site where the chest tube was placed.  You have a fever or persistent symptoms for more than 2-3 days.  You have a fever and your symptoms suddenly get worse. These symptoms may represent a serious problem that is an emergency. Do not wait to see if the symptoms will go away. Get medical help right away. Call your local emergency services (911 in the [place] Do not drive yourself to the hospital. Summary  A pneumothorax, commonly called a collapsed lung, is a condition in which air leaks from a lung and gets trapped between the lung and the chest wall (pleural space).  The buildup of air may be small or large. A small pneumothorax may go away on its own. A large pneumothorax will require treatment and hospitalization.  Treatment for this condition depends on how severe the pneumothorax is. The goal of treatment is to remove the extra air and allow the lung to expand back to its normal size. This information is not intended to  replace advice given to you by your health care provider. Make sure you discuss any questions you have with your health care provider. Document Revised: 12/23/2016 Document Reviewed: 12/19/2016 Elsevier Patient Education  2020 [employer]  

## 2019-02-26 ENCOUNTER — Telehealth: Payer: Self-pay | Admitting: *Deleted

## 2019-02-26 NOTE — Telephone Encounter (Signed)
Pt was on TCM report admitted for acute SOB/CP. Chest x-ray was done which showed spontaneous pneumothorax.  Chest tube was placed.  She was  PCCM was consulted.  Subsequently her chest tube was removed yesterday.  Her chest x-ray was repeated today which shows stable but minimal right upper lobe pneumothorax.  Patient has no symptoms of chest pain or shortness of breath and PCCM  cleared the patient for discharge.  She is being discharged in stable condition.  Patient is aware that she is at risk of having recurrent pneumothorax if she were to have excessive cough again due to her history of sarcoidosis. Pt has hosp f/u scheduled 03/01/19 w/her pulmonologist../lmb

## 2019-03-01 ENCOUNTER — Encounter: Payer: Self-pay | Admitting: Adult Health

## 2019-03-01 ENCOUNTER — Other Ambulatory Visit: Payer: Self-pay

## 2019-03-01 ENCOUNTER — Ambulatory Visit (INDEPENDENT_AMBULATORY_CARE_PROVIDER_SITE_OTHER): Payer: 59

## 2019-03-01 ENCOUNTER — Ambulatory Visit: Payer: 59 | Admitting: Adult Health

## 2019-03-01 VITALS — BP 140/64 | HR 68 | Temp 98.1°F | Ht 62.0 in | Wt 139.6 lb

## 2019-03-01 DIAGNOSIS — J939 Pneumothorax, unspecified: Secondary | ICD-10-CM

## 2019-03-01 DIAGNOSIS — J9383 Other pneumothorax: Secondary | ICD-10-CM | POA: Diagnosis not present

## 2019-03-01 DIAGNOSIS — D869 Sarcoidosis, unspecified: Secondary | ICD-10-CM

## 2019-03-01 DIAGNOSIS — J9611 Chronic respiratory failure with hypoxia: Secondary | ICD-10-CM | POA: Diagnosis not present

## 2019-03-01 NOTE — Patient Instructions (Signed)
Continue on current regimen .  No lifting over 20lbs for next 4 weeks .  Follow up with Dr. Annamaria Boots in 6 weeks and As needed   Please contact office for sooner follow up if symptoms do not improve or worsen or seek emergency care

## 2019-03-01 NOTE — Progress Notes (Signed)
@Patient  ID: Lauren Massey, female    DOB: Jul 02, 1955, 64 y.o.   MRN: [id number]  Chief Complaint  Patient presents with  . Follow-up    Pneumothorax    Referring provider: Hoyt Koch, *  HPI: 64 year old female never smoker followed for chronic fibrotic sarcoidosis with multisystem involvement including lungs, skin, eyes-diagnosed via bronchoscopy 2000.  Chronic respiratory failure on oxygen 2 L with activity as needed   TEST/EVENTS :  Failed Plaquenil for skin rash. ACE 02/19/15- elevated 97, down from >100 07/2014.  ACE 05/12/16-   50 (9-67) Office Spirometry 08/06/2015-moderate restriction. FVC 1.53/63%, FEV1 1.19/62%, FEV1/FVC 0.78 Room air saturation at rest on arrival 03/14/16- 88%, desaturating to 85% with ambulation and improving to 98% ambulating on 2 L. PFT 03/16/17-moderately severe restriction, severe reduction of diffusion.  FVC 1.62/66%, FEV1 1.46/76%, ratio 0.90, TLC 50%, DLCO 26%   03/01/2019 follow-up: Sarcoidosis, post hospital follow-up for pneumothorax Patient presents for a post hospital follow-up.  Patient was admitted this past week for acute respiratory distress, spontaneous right-sided pneumothorax.  Chest tube was placed with lung reexpansion.  This was removed prior to discharge.  Patient says since discharge she is doing better.  She has less shortness of breath.  Previous chest tube site without redness or drainage.  Chest x-ray today shows no evidence of pneumothorax.  Chronic changes were stable.  She remains on weekly methotrexate.  She denies any chest pain or increased shortness of breath or edema.   No Known Allergies  Immunization History  Administered Date(s) Administered  . Influenza Split 12/01/2011  . Influenza Whole 10/19/2007  . Influenza,inj,Quad PF,6+ Mos 10/01/2013, 02/19/2015, 11/09/2015, 11/02/2017, 02/08/2019  . Pneumococcal Polysaccharide-23 12/01/2011  . Tdap 02/05/2018    Past Medical History:  Diagnosis Date  .  Cataract    right eye  . Sarcoidosis    skin, eye, lung  . Unspecified iridocyclitis     Tobacco History: Social History   Tobacco Use  Smoking Status Never Smoker  Smokeless Tobacco Never Used   Counseling given: Not Answered   Outpatient Medications Prior to Visit  Medication Sig Dispense Refill  . albuterol (VENTOLIN HFA) 108 (90 Base) MCG/ACT inhaler Inhale 2 puffs into the lungs every 6 (six) hours as needed for wheezing or shortness of breath. 18 g 12  . folic acid (FOLVITE) 1 MG tablet Take 1 mg by mouth daily.    . hydrochlorothiazide (MICROZIDE) 12.5 MG capsule Take 12.5 mg by mouth daily.    . methotrexate (RHEUMATREX) 2.5 MG tablet Take 12.5 mg by mouth once a week. Caution:Chemotherapy. Protect from light.  Pt takes 5 tablets every Wednesday.    . prednisoLONE acetate (PRED FORTE) 1 % ophthalmic suspension Place 1 drop into the right eye 2 (two) times daily. Reported on 04/16/2015 (Patient taking differently: Place 1 drop into the right eye 4 (four) times daily. Reported on 04/16/2015) 5 mL 1   No facility-administered medications prior to visit.     Review of Systems:   Constitutional:   No  weight loss, night sweats,  Fevers, chills, fatigue, or  lassitude.  HEENT:   No headaches,  Difficulty swallowing,  Tooth/dental problems, or  Sore throat,                No sneezing, itching, ear ache, nasal congestion, post nasal drip,   CV:  No chest pain,  Orthopnea, PND, swelling in lower extremities, anasarca, dizziness, palpitations, syncope.   GI  No heartburn, indigestion, abdominal  pain, nausea, vomiting, diarrhea, change in bowel habits, loss of appetite, bloody stools.   Resp:   No chest wall deformity  Skin: no rash or lesions.  GU: no dysuria, change in color of urine, no urgency or frequency.  No flank pain, no hematuria   MS:  No joint pain or swelling.  No decreased range of motion.  No back pain.    Physical Exam  BP 140/64 (BP Location: Left  Arm, Cuff Size: Normal)   Pulse 68   Temp 98.1 F (36.7 C) (Temporal)   Ht 5\' 2"  (1.575 m)   Wt 139 lb 9.6 oz (63.3 kg)   SpO2 94% Comment: RA  BMI 25.53 kg/m   GEN: A/Ox3; pleasant , NAD.    HEENT:  [place]/AT,   , NOSE-clear, THROAT-clear, no lesions, no postnasal drip or exudate noted.   NECK:  Supple w/ fair ROM; no JVD; normal carotid impulses w/o bruits; no thyromegaly or nodules palpated; no lymphadenopathy.    RESP  Clear  P & A; w/o, wheezes/ rales/ or rhonchi. no accessory muscle use, no dullness to percussion Right sided lateral rib , healed site from previous Chest tube well approximated, no redness.   CARD:  RRR, no m/r/g, no peripheral edema, pulses intact, no cyanosis or clubbing.  GI:   Soft & nt; nml bowel sounds; no organomegaly or masses detected.   Musco: Warm bil, no deformities or joint swelling noted.   Neuro: alert, no focal deficits noted.    Skin: Warm, no lesions or rashes    Lab Results:  CBC    Component Value Date/Time   WBC 5.4 02/22/2019 0248   RBC 4.75 02/22/2019 0248   HGB 13.3 02/22/2019 0248   HCT 44.1 02/22/2019 0248   PLT 168 02/22/2019 0248   MCV 92.8 02/22/2019 0248   MCH 28.0 02/22/2019 0248   MCHC 30.2 02/22/2019 0248   RDW 14.0 02/22/2019 0248   LYMPHSABS 1.6 03/14/2016 1559   MONOABS 0.3 03/14/2016 1559   EOSABS 0.1 03/14/2016 1559   BASOSABS 0.0 03/14/2016 1559    BMET    Component Value Date/Time   NA 136 02/22/2019 0248   K 3.5 02/22/2019 0248   CL 100 02/22/2019 0248   CO2 28 02/22/2019 0248   GLUCOSE 112 (H) 02/22/2019 0248   BUN 19 02/22/2019 0248   CREATININE 0.72 02/22/2019 0248   CALCIUM 9.6 02/22/2019 0248   GFRNONAA >60 02/22/2019 0248   GFRAA >60 02/22/2019 0248    BNP No results found for: BNP  ProBNP    Component Value Date/Time   PROBNP 26.0 05/12/2016 1546    Imaging: DG Chest 2 View  Result Date: 02/22/2019 CLINICAL DATA:  Chest tightness, shortness of breath EXAM: CHEST - 2 VIEW  COMPARISON:  02/15/2019 FINDINGS: There is a new right pneumothorax, approximately 30% in size. Heart is normal size. Chronic interstitial opacities within the lungs. No effusions. No acute bony abnormality. IMPRESSION: New moderate-sized right pneumothorax. Stable chronic interstitial prominence throughout the lungs. These results were called by telephone at the time of interpretation on 02/22/2019 at 3:08 am to provider Ripley Fraise , who verbally acknowledged these results. Electronically Signed   By: Rolm Baptise M.D.   On: 02/22/2019 03:09   DG Chest 2 View  Result Date: 02/15/2019 CLINICAL DATA:  64 year old female with subcu doses. Follow-up radiograph. EXAM: CHEST - 2 VIEW COMPARISON:  Chest radiograph dated 02/08/2019. FINDINGS: There is diffuse interstitial coarsening and chronic bronchitic changes. Right  upper lobe linear and streaky densities, likely chronic and sequela of known subcu doses. Superimposed infection is not entirely excluded. Clinical correlation is recommended. The previously seen right apical pneumothorax is not visualized on today's exam. There is no pleural effusion. Stable cardiac silhouette. Atherosclerotic calcification of the aorta. No acute osseous pathology. IMPRESSION: 1. Interval resolution of the previously seen small right apical pneumothorax. 2. Chronic changes of sarcoidosis. Mild increased density in the right upper lobe may be chronic although developing infiltrate is not excluded. Clinical correlation is recommended. Electronically Signed   By: Anner Crete M.D.   On: 02/15/2019 15:29   DG Chest 2 View  Result Date: 02/08/2019 CLINICAL DATA:  64 year old female with history of sarcoidosis. Follow-up study. EXAM: CHEST - 2 VIEW COMPARISON:  Chest x-ray 01/31/2018. FINDINGS: Lung volumes are normal. Widespread reticulonodular opacities are again noted throughout the lungs bilaterally, very similar to the prior study. Small right apical pneumothorax, new  compared to the prior examination. No left pneumothorax. No definite acute consolidative airspace disease. No pleural effusions. No evidence of pulmonary edema. Heart size is normal. Upper mediastinal contours are within normal limits. Aortic atherosclerosis. IMPRESSION: 1. Small right apical pneumothorax occupying 5-10% of the volume of the right hemithorax. 2. Imaging stigmata of sarcoidosis in the lungs, similar to the prior study, as detailed above. Critical Value/emergent results were called by telephone at the time of interpretation on 02/08/2019 at 3:10 pm to [place], who verbally acknowledged these results. Electronically Signed   By: Vinnie Langton M.D.   On: 02/08/2019 15:10   DG Chest Port 1 View  Result Date: 02/25/2019 CLINICAL DATA:  Pneumothorax follow-up EXAM: PORTABLE CHEST 1 VIEW COMPARISON:  Yesterday FINDINGS: A trace right apical pneumothorax is subtly visualized. Reticular opacities in the bilateral lungs. There is history of sarcoid. Normal heart size and stable mediastinal contours. There is chronic hilar adenopathy. IMPRESSION: Slight residual right apical pneumothorax without change from yesterday. Electronically Signed   By: Monte Fantasia M.D.   On: 02/25/2019 08:03   DG CHEST PORT 1 VIEW  Result Date: 02/24/2019 CLINICAL DATA:  64 year old female chest tube removal. History of pneumothorax. Sarcoidosis, spontaneous pneumothorax. EXAM: PORTABLE CHEST 1 VIEW COMPARISON:  0626 hours today and earlier. FINDINGS: Portable AP upright view at 1559 hours. Right chest tube removed. Small right apical residual pneumothorax appears stable. There is also pleural edge still visible at the lateral 3/4 rib level. Underlying chronic lung disease. Stable cardiac size and mediastinal contours. Stable trachea. Negative visible bowel gas pattern. No acute osseous abnormality identified. IMPRESSION: 1. Right chest tube removed with stable small residual right pneumothorax. 2. Chronic  lung disease.  No new cardiopulmonary abnormality. Electronically Signed   By: Genevie Ann M.D.   On: 02/24/2019 16:12   DG CHEST PORT 1 VIEW  Result Date: 02/24/2019 CLINICAL DATA:  Patient with history of pneumothorax EXAM: PORTABLE CHEST 1 VIEW COMPARISON:  Chest radiograph 02/23/2019 FINDINGS: Monitoring leads overlie the patient. Stable enlarged cardiac and mediastinal contours. Right chest tube remains in place. Similar-appearing diffuse bilateral airspace opacities. Mild interval decrease in size of tiny right apical pneumothorax. Thoracic spine degenerative changes. IMPRESSION: Slight interval decrease in size of small right apical pneumothorax. Right chest tube remains in place. Persistent diffuse bilateral airspace opacities. Electronically Signed   By: Lovey Newcomer M.D.   On: 02/24/2019 07:15   DG Chest Port 1 View  Result Date: 02/23/2019 CLINICAL DATA:  Possible discharge. EXAM: PORTABLE CHEST 1 VIEW COMPARISON:  Chest radiograph 02/22/2019 FINDINGS: Monitoring leads overlie the patient. Right chest tube remains in place. Stable cardiac and mediastinal contours. Similar-appearing diffuse bilateral coarse interstitial opacities. Small right apical pneumothorax. Thoracic spine degenerative changes. IMPRESSION: There is a small right apical pneumothorax. Right chest tube remains in position. Similar-appearing diffuse bilateral coarse interstitial opacities. Electronically Signed   By: Lovey Newcomer M.D.   On: 02/23/2019 06:32   DG Chest Portable 1 View  Result Date: 02/22/2019 CLINICAL DATA:  Post chest tube insertion EXAM: PORTABLE CHEST 1 VIEW COMPARISON:  Radiograph 02/22/2019 FINDINGS: Interval placement of a pigtail right pleural drain. Small amount of gas noted along the right chest wall. There is been interval decrease in the size of a right pneumothorax seen on comparison study without visible residual pleural gas at this time. Chronic interstitial and reticulonodular opacities similar to  comparison radiograph. The cardiomediastinal contours are unremarkable. No acute osseous or soft tissue abnormality. Degenerative changes are present in the imaged spine and shoulders. IMPRESSION: 1. Interval placement of right pleural drain with decrease in size of right pneumothorax. No visible residual pleural gas at this time. 2. Small amount of subcutaneous emphysema at the chest tube insertion site. 3. Stable chronic interstitial and reticulonodular opacities. Electronically Signed   By: Lovena Le M.D.   On: 02/22/2019 05:23      PFT Results Latest Ref Rng & Units 03/16/2017 10/01/2013  FVC-Pre L 1.63 1.95  FVC-Predicted Pre % 67 77  FVC-Post L 1.62 1.93  FVC-Predicted Post % 66 76  Pre FEV1/FVC % % 87 80  Post FEV1/FCV % % 90 83  FEV1-Pre L 1.42 1.56  FEV1-Predicted Pre % 74 78  FEV1-Post L 1.46 1.61  DLCO UNC% % 26 61  DLCO COR %Predicted % 55 100  TLC L 2.43 3.00  TLC % Predicted % 50 62  RV % Predicted % 41 59    No results found for: NITRICOXIDE      Assessment & Plan:   No problem-specific Assessment & Plan notes found for this encounter.     Rexene Edison, NP 03/01/2019

## 2019-03-04 NOTE — Assessment & Plan Note (Signed)
Use oxygen 2 L with activity as needed

## 2019-03-04 NOTE — Assessment & Plan Note (Signed)
Currently stable continue on current regimen °

## 2019-03-04 NOTE — Assessment & Plan Note (Signed)
Chest x-ray today shows resolved right sided pneumothorax. Patient advised not to be doing any heavy lifting.  Plan  Patient Instructions  Continue on current regimen .  No lifting over 20lbs for next 4 weeks .  Follow up with Dr. Annamaria Boots in 6 weeks and As needed   Please contact office for sooner follow up if symptoms do not improve or worsen or seek emergency care

## 2019-03-06 ENCOUNTER — Telehealth: Payer: Self-pay | Admitting: Internal Medicine

## 2019-03-06 ENCOUNTER — Encounter (HOSPITAL_COMMUNITY): Payer: Self-pay

## 2019-03-06 ENCOUNTER — Emergency Department (HOSPITAL_COMMUNITY): Payer: 59

## 2019-03-06 ENCOUNTER — Encounter: Payer: Self-pay | Admitting: Acute Care

## 2019-03-06 ENCOUNTER — Inpatient Hospital Stay (HOSPITAL_COMMUNITY)
Admission: EM | Admit: 2019-03-06 | Discharge: 2019-03-16 | DRG: 164 | Disposition: A | Discharge status: Home / Self Care | Payer: 59 | Attending: Internal Medicine | Admitting: Internal Medicine

## 2019-03-06 ENCOUNTER — Ambulatory Visit (INDEPENDENT_AMBULATORY_CARE_PROVIDER_SITE_OTHER): Payer: 59

## 2019-03-06 ENCOUNTER — Ambulatory Visit: Payer: 59 | Admitting: Acute Care

## 2019-03-06 ENCOUNTER — Other Ambulatory Visit: Payer: Self-pay

## 2019-03-06 VITALS — BP 132/70 | HR 90 | Temp 97.3°F | Ht 62.0 in | Wt 141.6 lb

## 2019-03-06 DIAGNOSIS — H209 Unspecified iridocyclitis: Secondary | ICD-10-CM | POA: Diagnosis present

## 2019-03-06 DIAGNOSIS — Z20822 Contact with and (suspected) exposure to covid-19: Secondary | ICD-10-CM | POA: Diagnosis present

## 2019-03-06 DIAGNOSIS — J939 Pneumothorax, unspecified: Secondary | ICD-10-CM

## 2019-03-06 DIAGNOSIS — J930 Spontaneous tension pneumothorax: Secondary | ICD-10-CM | POA: Diagnosis not present

## 2019-03-06 DIAGNOSIS — D869 Sarcoidosis, unspecified: Secondary | ICD-10-CM

## 2019-03-06 DIAGNOSIS — I10 Essential (primary) hypertension: Secondary | ICD-10-CM | POA: Diagnosis present

## 2019-03-06 DIAGNOSIS — J9611 Chronic respiratory failure with hypoxia: Secondary | ICD-10-CM

## 2019-03-06 DIAGNOSIS — J984 Other disorders of lung: Secondary | ICD-10-CM | POA: Diagnosis present

## 2019-03-06 DIAGNOSIS — I471 Supraventricular tachycardia: Secondary | ICD-10-CM | POA: Diagnosis not present

## 2019-03-06 DIAGNOSIS — Z01818 Encounter for other preprocedural examination: Secondary | ICD-10-CM | POA: Diagnosis not present

## 2019-03-06 DIAGNOSIS — J9383 Other pneumothorax: Secondary | ICD-10-CM | POA: Diagnosis present

## 2019-03-06 DIAGNOSIS — I272 Pulmonary hypertension, unspecified: Secondary | ICD-10-CM | POA: Diagnosis present

## 2019-03-06 DIAGNOSIS — D860 Sarcoidosis of lung: Secondary | ICD-10-CM | POA: Diagnosis present

## 2019-03-06 DIAGNOSIS — J479 Bronchiectasis, uncomplicated: Secondary | ICD-10-CM | POA: Diagnosis not present

## 2019-03-06 DIAGNOSIS — I440 Atrioventricular block, first degree: Secondary | ICD-10-CM | POA: Diagnosis present

## 2019-03-06 DIAGNOSIS — Z9689 Presence of other specified functional implants: Secondary | ICD-10-CM

## 2019-03-06 DIAGNOSIS — Z79899 Other long term (current) drug therapy: Secondary | ICD-10-CM | POA: Diagnosis not present

## 2019-03-06 DIAGNOSIS — J9601 Acute respiratory failure with hypoxia: Secondary | ICD-10-CM

## 2019-03-06 DIAGNOSIS — Z09 Encounter for follow-up examination after completed treatment for conditions other than malignant neoplasm: Secondary | ICD-10-CM

## 2019-03-06 DIAGNOSIS — R0602 Shortness of breath: Secondary | ICD-10-CM | POA: Diagnosis not present

## 2019-03-06 DIAGNOSIS — J9311 Primary spontaneous pneumothorax: Secondary | ICD-10-CM | POA: Diagnosis not present

## 2019-03-06 DIAGNOSIS — I361 Nonrheumatic tricuspid (valve) insufficiency: Secondary | ICD-10-CM | POA: Diagnosis not present

## 2019-03-06 LAB — PROTIME-INR
INR: 1 (ref 0.8–1.2)
Prothrombin Time: 12.9 seconds (ref 11.4–15.2)

## 2019-03-06 LAB — RESPIRATORY PANEL BY RT PCR (FLU A&B, COVID)
Influenza A by PCR: NEGATIVE
Influenza B by PCR: NEGATIVE
SARS Coronavirus 2 by RT PCR: NEGATIVE

## 2019-03-06 LAB — CBC WITH DIFFERENTIAL/PLATELET
Abs Immature Granulocytes: 0.02 10*3/uL (ref 0.00–0.07)
Basophils Absolute: 0 10*3/uL (ref 0.0–0.1)
Basophils Relative: 1 %
Eosinophils Absolute: 0.1 10*3/uL (ref 0.0–0.5)
Eosinophils Relative: 2 %
HCT: 37.9 % (ref 36.0–46.0)
Hemoglobin: 12.1 g/dL (ref 12.0–15.0)
Immature Granulocytes: 1 %
Lymphocytes Relative: 24 %
Lymphs Abs: 1.1 10*3/uL (ref 0.7–4.0)
MCH: 28.5 pg (ref 26.0–34.0)
MCHC: 31.9 g/dL (ref 30.0–36.0)
MCV: 89.4 fL (ref 80.0–100.0)
Monocytes Absolute: 0.5 10*3/uL (ref 0.1–1.0)
Monocytes Relative: 12 %
Neutro Abs: 2.7 10*3/uL (ref 1.7–7.7)
Neutrophils Relative %: 60 %
Platelets: 285 10*3/uL (ref 150–400)
RBC: 4.24 MIL/uL (ref 3.87–5.11)
RDW: 14.3 % (ref 11.5–15.5)
WBC: 4.4 10*3/uL (ref 4.0–10.5)
nRBC: 0 % (ref 0.0–0.2)

## 2019-03-06 LAB — BASIC METABOLIC PANEL
Anion gap: 8 (ref 5–15)
BUN: 10 mg/dL (ref 8–23)
CO2: 28 mmol/L (ref 22–32)
Calcium: 9 mg/dL (ref 8.9–10.3)
Chloride: 100 mmol/L (ref 98–111)
Creatinine, Ser: 0.68 mg/dL (ref 0.44–1.00)
GFR calc Af Amer: >60 mL/min (ref >60)
GFR calc non Af Amer: >60 mL/min (ref >60)
Glucose, Bld: 106 mg/dL — ABNORMAL HIGH (ref 70–99)
Potassium: 4.1 mmol/L (ref 3.5–5.1)
Sodium: 136 mmol/L (ref 135–145)

## 2019-03-06 MED ORDER — LIDOCAINE HCL (PF) 1 % IJ SOLN
30.0000 mL | Freq: Once | INTRAMUSCULAR | Status: AC
  Administered 2019-03-06: 30 mL via INTRADERMAL
  Filled 2019-03-06: qty 30

## 2019-03-06 MED ORDER — ONDANSETRON HCL 4 MG/2ML IJ SOLN
4.0000 mg | Freq: Once | INTRAMUSCULAR | Status: AC
  Administered 2019-03-06: 16:00:00 4 mg via INTRAVENOUS
  Filled 2019-03-06: qty 2

## 2019-03-06 MED ORDER — HYDROMORPHONE HCL 1 MG/ML IJ SOLN
1.0000 mg | Freq: Once | INTRAMUSCULAR | Status: AC
  Administered 2019-03-06: 1 mg via INTRAVENOUS
  Filled 2019-03-06: qty 1

## 2019-03-06 MED ORDER — ACETAMINOPHEN 325 MG PO TABS
650.0000 mg | ORAL_TABLET | ORAL | Status: DC | PRN
  Administered 2019-03-07 – 2019-03-13 (×3): 650 mg via ORAL
  Filled 2019-03-06 (×3): qty 2

## 2019-03-06 MED ORDER — LORAZEPAM 2 MG/ML IJ SOLN
0.5000 mg | Freq: Once | INTRAMUSCULAR | Status: AC
  Administered 2019-03-06: 0.5 mg via INTRAVENOUS
  Filled 2019-03-06: qty 1

## 2019-03-06 MED ORDER — HEPARIN SODIUM (PORCINE) 5000 UNIT/ML IJ SOLN
5000.0000 [IU] | Freq: Three times a day (TID) | INTRAMUSCULAR | Status: DC
  Administered 2019-03-06 – 2019-03-11 (×16): 5000 [IU] via SUBCUTANEOUS
  Filled 2019-03-06 (×18): qty 1

## 2019-03-06 MED ORDER — ACETAMINOPHEN 325 MG PO TABS
650.0000 mg | ORAL_TABLET | Freq: Once | ORAL | Status: AC
  Administered 2019-03-06: 16:00:00 650 mg via ORAL
  Filled 2019-03-06: qty 2

## 2019-03-06 MED ORDER — LIDOCAINE HCL (PF) 1 % IJ SOLN
INTRAMUSCULAR | Status: AC
  Filled 2019-03-06: qty 30

## 2019-03-06 MED ORDER — HYDROCHLOROTHIAZIDE 12.5 MG PO CAPS
12.5000 mg | ORAL_CAPSULE | Freq: Every day | ORAL | Status: DC
  Administered 2019-03-07 – 2019-03-16 (×9): 12.5 mg via ORAL
  Filled 2019-03-06 (×9): qty 1

## 2019-03-06 MED ORDER — PREDNISOLONE ACETATE 1 % OP SUSP
1.0000 [drp] | Freq: Two times a day (BID) | OPHTHALMIC | Status: DC
  Administered 2019-03-07 – 2019-03-16 (×18): 1 [drp] via OPHTHALMIC
  Filled 2019-03-06 (×2): qty 5

## 2019-03-06 MED ORDER — SODIUM CHLORIDE 0.9 % IV SOLN: Freq: Once | INTRAVENOUS | Status: AC

## 2019-03-06 MED ORDER — ALBUTEROL SULFATE (2.5 MG/3ML) 0.083% IN NEBU: 2.5000 mg | INHALATION_SOLUTION | Freq: Four times a day (QID) | RESPIRATORY_TRACT | Status: DC | PRN

## 2019-03-06 MED ORDER — HYDROMORPHONE HCL 1 MG/ML IJ SOLN
0.5000 mg | Freq: Once | INTRAMUSCULAR | Status: AC
  Administered 2019-03-06: 17:00:00 0.5 mg via INTRAVENOUS
  Filled 2019-03-06: qty 1

## 2019-03-06 MED ORDER — LIDOCAINE HCL (PF) 1 % IJ SOLN
30.0000 mL | Freq: Once | INTRAMUSCULAR | Status: AC
  Administered 2019-03-06: 17:00:00 30 mL via INTRADERMAL

## 2019-03-06 MED ORDER — OXYCODONE HCL 5 MG PO TABS
5.0000 mg | ORAL_TABLET | Freq: Four times a day (QID) | ORAL | Status: DC | PRN
  Administered 2019-03-06 – 2019-03-07 (×2): 5 mg via ORAL
  Filled 2019-03-06 (×2): qty 1

## 2019-03-06 MED ORDER — KETOROLAC TROMETHAMINE 30 MG/ML IJ SOLN
30.0000 mg | Freq: Three times a day (TID) | INTRAMUSCULAR | Status: AC | PRN
  Administered 2019-03-07 (×2): 30 mg via INTRAVENOUS
  Filled 2019-03-06 (×2): qty 1

## 2019-03-06 MED ORDER — FOLIC ACID 1 MG PO TABS
1.0000 mg | ORAL_TABLET | Freq: Every day | ORAL | Status: DC
  Administered 2019-03-06 – 2019-03-16 (×10): 1 mg via ORAL
  Filled 2019-03-06 (×10): qty 1

## 2019-03-06 NOTE — Progress Notes (Signed)
History of Present Illness Lauren Massey is a 64 y.o. female never smoker followed for chronic fibrotic sarcoidosis with multisystem involvement including lungs, skin, eyes-diagnosed via bronchoscopy 2000.  Chronic respiratory failure on oxygen 2 L with activity as needed. She is followed by Dr. Annamaria Boots.  64 year old never smoker who is followed by Dr. Ketur[facility] for known sarcoid 3 evaluate eyes and eye redness along with lung and skin.  Status post bronchoscopy 2000.  She is also noted failed Plaquenil for skin rash.  Her office spirometry is 08/06/2015 with-moderate restriction. FVC 1.53/63%, FEV1 1.19/62%, FEV1/FVC 0.78. She has had 2 spontaneous pneumothoraces 02/22/2019, and again 03/06/2019 requiring emergent chest tube placement.    Previous [facility] Visit >> Hospital Follow Up 03/01/2019 follow-up: Sarcoidosis, post hospital follow-up for pneumothorax:  Chronic fibrotic sarcoidosis complicated by spontaneous right pneumothorax s/p chest tube on 02/22/2019. Patient presents for a post hospital follow-up.  Patient was admitted  for acute respiratory distress, spontaneous right-sided pneumothorax from 02/22/2019-02/25/2019.  Chest tube was placed with lung reexpansion.  This was removed 02/24/2019 prior to discharge . Since discharge pt. endorses  doing better. She has less shortness of breath.  Previous chest tube site healed and was without redness or drainage.  Chest x-ray 03/01/2019  showed no evidence of pneumothorax. Chronic changes were stable. She remained on weekly methotrexate.  She denied any chest pain or increased shortness of breath or edema.   03/07/2019 Acute OV for worsening dyspnea Pt. Presents for acute OV. She called the office this morning stating she  went to work on Monday and felt fine, but on Tuesday she was starting to feel sluggish and last night had to put herself back on oxygen.She endorsed   shortness of breath and chest tightness that   lasts at  least 5 minutes after she puts the O2 back on, but resolves somewhat with oxygen. She has been having to wear her oxygen at 4 L, not the 2 L she normally wears since Tuesday. Patient stated that she does not feel right and feels like something is wrong with her body. When asked if she feels like she did when she had her pneumothorax, she said from a shortness of breath perspective, yes, but she  did not have the pain she had with the pneumothorax, so she did not think that this was another collapsed lung.   Pt. Was scheduled for a CXR and OV to re-evaluate for recurrent pneumothorax. She presents today with initial saturations of 70% on RA. She was immediately placed on oxygen at 4 L with saturation response to 96%.  She states she was not wearing oxygen as she does not have a portable tank. She states she has been more short of breath than normal.  Upon auscultation, her breath sounds are diminished in the right upper and middle lobe. Stat CXR reveals a large ( 50%) right tension  pneumothorax with some mediastinal shift to the left  per radiology reading.  911 was called  for the patient to be transported to the [facility] ED with constant medical monitoring, and a paramedic who would be able to perform needle thoracostomy  if it was necessitated. Plan is for right chest tube insertion. The ED triage RN  has been notified by me that  the patient is en route to the ED and will need a Chest tube placed upon arrival. She verbalized understanding.   I did discuss with both the patient and her husband that in  the future when she develops acute shortness of breath, that she needs to seek emergency care right away as she had now has had a recurrent pneumothorax. Additionally I discussed the possibility of   talc  Pleurodesis per Chest tube  vs. VATS to decrease the risk of recurrence as these are related to her advanced pulmonary sarcoidosis, and without treatment will mopst likely continue to recur. . They both  verbalized understanding. Pt. Was transported to the ED with stable vitals.     Test Results: CXR 03/06/2019>> reviewed personally by me There is a 50% right tension pneumothorax with slight shift of the mediastinal structures to the left. Severe chronic changes of pulmonary sarcoidosis are stable.  Failed Plaquenil for skin rash. ACE 02/19/15- elevated 97, down from >100 07/2014.  ACE 05/12/16- 50 (9-67) Office Spirometry 08/06/2015-moderate restriction. FVC 1.53/63%, FEV1 1.19/62%, FEV1/FVC 0.78 Room air saturation at rest on arrival 03/14/16-88%, desaturating to 85% with ambulation and improving to 98% ambulating on 2 L. PFT 03/16/17-moderately severe restriction, severe reduction of diffusion. FVC 1.62/66%, FEV1 1.46/76%, ratio 0.90, TLC 50%, DLCO 26%   CBC Latest Ref Rng & Units 03/06/2019 02/22/2019 03/14/2016  WBC 4.0 - 10.5 K/uL 4.4 5.4 3.8(L)  Hemoglobin 12.0 - 15.0 g/dL 12.1 13.3 11.6(L)  Hematocrit 36.0 - 46.0 % 37.9 44.1 35.4(L)  Platelets 150 - 400 K/uL 285 168 197.0    BMP Latest Ref Rng & Units 03/06/2019 02/22/2019 03/14/2016  Glucose 70 - 99 mg/dL 106(H) 112(H) 89  BUN 8 - 23 mg/dL 10 19 12   Creatinine 0.44 - 1.00 mg/dL 0.68 0.72 0.65  Sodium 135 - 145 mmol/L 136 136 135  Potassium 3.5 - 5.1 mmol/L 4.1 3.5 3.7  Chloride 98 - 111 mmol/L 100 100 100  CO2 22 - 32 mmol/L 28 28 30   Calcium 8.9 - 10.3 mg/dL 9.0 9.6 9.6    BNP No results found for: BNP  ProBNP    Component Value Date/Time   PROBNP 26.0 05/12/2016 1546    PFT    Component Value Date/Time   FEV1PRE 1.42 03/16/2017 0900   FEV1POST 1.46 03/16/2017 0900   FVCPRE 1.63 03/16/2017 0900   FVCPOST 1.62 03/16/2017 0900   TLC 2.43 03/16/2017 0900   DLCOUNC 5.87 03/16/2017 0900   PREFEV1FVCRT 87 03/16/2017 0900   PSTFEV1FVCRT 90 03/16/2017 0900    Immunization History  Administered Date(s) Administered  . Influenza Split 12/01/2011  . Influenza Whole 10/19/2007  . Influenza,inj,Quad PF,6+ Mos  10/01/2013, 02/19/2015, 11/09/2015, 11/02/2017, 02/08/2019  . Pneumococcal Polysaccharide-23 12/01/2011  . Tdap 02/05/2018     DG Chest 2 View  Addendum Date: 03/06/2019   ADDENDUM REPORT: 03/06/2019 14:44 ADDENDUM: Critical Value/emergent results were called by telephone at the time of interpretation on 03/06/2019 at 2:44 pm to provider [facility] , who verbally acknowledged these results. Electronically Signed   By: Lorriane Shire M.D.   On: 03/06/2019 14:44   Result Date: 03/06/2019 CLINICAL DATA:  Pneumothorax.  Pulmonary sarcoidosis. EXAM: CHEST - 2 VIEW COMPARISON:  03/01/2019 FINDINGS: There is a 50% right tension pneumothorax with slight shift of the mediastinal structures to the left. Severe chronic changes of pulmonary sarcoidosis are stable. Heart size is normal. Enlargement of the main pulmonary artery could be secondary to pulmonary arterial hypertension. The lungs are hyperinflated chronically. Aortic atherosclerosis. No acute bone abnormality. IMPRESSION: 1. 50% right tension pneumothorax. 2. Stable chronic pulmonary sarcoidosis. 3.  Aortic Atherosclerosis (ICD10-I70.0). Electronically Signed: By: Lorriane Shire M.D. On: 03/06/2019 14:40  DG Chest 2 View  Result Date: 03/01/2019 CLINICAL DATA:  64 year old female with history of sarcoidosis and right-sided pneumothorax. Follow-up exam. EXAM: CHEST - 2 VIEW COMPARISON:  Chest radiograph dated 02/25/2019. FINDINGS: No pneumothorax is identified. Bilateral reticular opacities again noted. There is no pleural effusion. The cardiac silhouette is within normal limits. Chronic hilar adenopathy. No acute osseous pathology. IMPRESSION: No identifiable pneumothorax. Electronically Signed   By: Anner Crete M.D.   On: 03/01/2019 15:09   DG Chest 2 View  Result Date: 02/22/2019 CLINICAL DATA:  Chest tightness, shortness of breath EXAM: CHEST - 2 VIEW COMPARISON:  02/15/2019 FINDINGS: There is a new right pneumothorax, approximately 30% in  size. Heart is normal size. Chronic interstitial opacities within the lungs. No effusions. No acute bony abnormality. IMPRESSION: New moderate-sized right pneumothorax. Stable chronic interstitial prominence throughout the lungs. These results were called by telephone at the time of interpretation on 02/22/2019 at 3:08 am to provider Ripley Fraise , who verbally acknowledged these results. Electronically Signed   By: Rolm Baptise M.D.   On: 02/22/2019 03:09   DG Chest 2 View  Result Date: 02/15/2019 CLINICAL DATA:  64 year old female with subcu doses. Follow-up radiograph. EXAM: CHEST - 2 VIEW COMPARISON:  Chest radiograph dated 02/08/2019. FINDINGS: There is diffuse interstitial coarsening and chronic bronchitic changes. Right upper lobe linear and streaky densities, likely chronic and sequela of known subcu doses. Superimposed infection is not entirely excluded. Clinical correlation is recommended. The previously seen right apical pneumothorax is not visualized on today's exam. There is no pleural effusion. Stable cardiac silhouette. Atherosclerotic calcification of the aorta. No acute osseous pathology. IMPRESSION: 1. Interval resolution of the previously seen small right apical pneumothorax. 2. Chronic changes of sarcoidosis. Mild increased density in the right upper lobe may be chronic although developing infiltrate is not excluded. Clinical correlation is recommended. Electronically Signed   By: Anner Crete M.D.   On: 02/15/2019 15:29   DG Chest 2 View  Result Date: 02/08/2019 CLINICAL DATA:  64 year old female with history of sarcoidosis. Follow-up study. EXAM: CHEST - 2 VIEW COMPARISON:  Chest x-ray 01/31/2018. FINDINGS: Lung volumes are normal. Widespread reticulonodular opacities are again noted throughout the lungs bilaterally, very similar to the prior study. Small right apical pneumothorax, new compared to the prior examination. No left pneumothorax. No definite acute consolidative  airspace disease. No pleural effusions. No evidence of pulmonary edema. Heart size is normal. Upper mediastinal contours are within normal limits. Aortic atherosclerosis. IMPRESSION: 1. Small right apical pneumothorax occupying 5-10% of the volume of the right hemithorax. 2. Imaging stigmata of sarcoidosis in the lungs, similar to the prior study, as detailed above. Critical Value/emergent results were called by telephone at the time of interpretation on 02/08/2019 at 3:10 pm to [place], who verbally acknowledged these results. Electronically Signed   By: Vinnie Langton M.D.   On: 02/08/2019 15:10   DG Chest Port 1 View  Result Date: 03/07/2019 CLINICAL DATA:  Pneumothorax EXAM: PORTABLE CHEST 1 VIEW COMPARISON:  Radiograph 03/06/2019 FINDINGS: Right apically directed chest tube remains in place, appears slightly retracted from comparison exam there is persistent visceral pleural line in the right apex concerning for residual apical pneumothorax. Persistent subcutaneous emphysema over the right chest wall slightly improved from comparison. Bilateral mixed interstitial and patchy airspace opacity is similar to prior accounting for slightly diminished volume in the left base. Cardiomediastinal contours are stable with a calcified aorta. There is mild nonspecific air distention of the bowel. No  acute osseous abnormality. Degenerative changes are present in the imaged spine and shoulders. IMPRESSION: Slight retraction of a right apically directed chest tube. Persistent small right apical pneumothorax. Persistent subcutaneous emphysema over the right chest wall. Unchanged appearance of the bilateral interstitial and airspace opacities accounting for diminished left lung volume. Electronically Signed   By: Lovena Le M.D.   On: 03/07/2019 06:55   DG Chest Port 1 View  Result Date: 03/06/2019 CLINICAL DATA:  Right chest tube in place. EXAM: PORTABLE CHEST 1 VIEW COMPARISON:  Radiograph earlier this  day. FINDINGS: Placement of right-sided chest tube with tip directed towards the apex in the mid upper lung. Decreased size of right pneumothorax with small residual apical laterally, less than 10%. Subcutaneous emphysema in the right lateral chest wall. Underlying irregular pulmonary opacities consistent with sarcoidosis, not significantly changed. There is decreased mediastinal shift from prior. Heart size is stable. No left pneumothorax. IMPRESSION: 1. Placement of right-sided chest tube with decreased size of right pneumothorax, small residual apical laterally approximately 10%. Decreased mediastinal shift from prior. 2. Underlying chronic changes of sarcoidosis. Electronically Signed   By: Keith Rake M.D.   On: 03/06/2019 17:28   DG Chest Port 1 View  Result Date: 02/25/2019 CLINICAL DATA:  Pneumothorax follow-up EXAM: PORTABLE CHEST 1 VIEW COMPARISON:  Yesterday FINDINGS: A trace right apical pneumothorax is subtly visualized. Reticular opacities in the bilateral lungs. There is history of sarcoid. Normal heart size and stable mediastinal contours. There is chronic hilar adenopathy. IMPRESSION: Slight residual right apical pneumothorax without change from yesterday. Electronically Signed   By: Monte Fantasia M.D.   On: 02/25/2019 08:03   DG CHEST PORT 1 VIEW  Result Date: 02/24/2019 CLINICAL DATA:  64 year old female chest tube removal. History of pneumothorax. Sarcoidosis, spontaneous pneumothorax. EXAM: PORTABLE CHEST 1 VIEW COMPARISON:  0626 hours today and earlier. FINDINGS: Portable AP upright view at 1559 hours. Right chest tube removed. Small right apical residual pneumothorax appears stable. There is also pleural edge still visible at the lateral 3/4 rib level. Underlying chronic lung disease. Stable cardiac size and mediastinal contours. Stable trachea. Negative visible bowel gas pattern. No acute osseous abnormality identified. IMPRESSION: 1. Right chest tube removed with stable small  residual right pneumothorax. 2. Chronic lung disease.  No new cardiopulmonary abnormality. Electronically Signed   By: Genevie Ann M.D.   On: 02/24/2019 16:12   DG CHEST PORT 1 VIEW  Result Date: 02/24/2019 CLINICAL DATA:  Patient with history of pneumothorax EXAM: PORTABLE CHEST 1 VIEW COMPARISON:  Chest radiograph 02/23/2019 FINDINGS: Monitoring leads overlie the patient. Stable enlarged cardiac and mediastinal contours. Right chest tube remains in place. Similar-appearing diffuse bilateral airspace opacities. Mild interval decrease in size of tiny right apical pneumothorax. Thoracic spine degenerative changes. IMPRESSION: Slight interval decrease in size of small right apical pneumothorax. Right chest tube remains in place. Persistent diffuse bilateral airspace opacities. Electronically Signed   By: Lovey Newcomer M.D.   On: 02/24/2019 07:15   DG Chest Port 1 View  Result Date: 02/23/2019 CLINICAL DATA:  Possible discharge. EXAM: PORTABLE CHEST 1 VIEW COMPARISON:  Chest radiograph 02/22/2019 FINDINGS: Monitoring leads overlie the patient. Right chest tube remains in place. Stable cardiac and mediastinal contours. Similar-appearing diffuse bilateral coarse interstitial opacities. Small right apical pneumothorax. Thoracic spine degenerative changes. IMPRESSION: There is a small right apical pneumothorax. Right chest tube remains in position. Similar-appearing diffuse bilateral coarse interstitial opacities. Electronically Signed   By: Lovey Newcomer M.D.   On:  02/23/2019 06:32   DG Chest Portable 1 View  Result Date: 02/22/2019 CLINICAL DATA:  Post chest tube insertion EXAM: PORTABLE CHEST 1 VIEW COMPARISON:  Radiograph 02/22/2019 FINDINGS: Interval placement of a pigtail right pleural drain. Small amount of gas noted along the right chest wall. There is been interval decrease in the size of a right pneumothorax seen on comparison study without visible residual pleural gas at this time. Chronic interstitial and  reticulonodular opacities similar to comparison radiograph. The cardiomediastinal contours are unremarkable. No acute osseous or soft tissue abnormality. Degenerative changes are present in the imaged spine and shoulders. IMPRESSION: 1. Interval placement of right pleural drain with decrease in size of right pneumothorax. No visible residual pleural gas at this time. 2. Small amount of subcutaneous emphysema at the chest tube insertion site. 3. Stable chronic interstitial and reticulonodular opacities. Electronically Signed   By: Lovena Le M.D.   On: 02/22/2019 05:23     Past medical hx Past Medical History:  Diagnosis Date  . Cataract    right eye  . Sarcoidosis    skin, eye, lung  . Unspecified iridocyclitis      Social History   Tobacco Use  . Smoking status: Never Smoker  . Smokeless tobacco: Never Used  Substance Use Topics  . Alcohol use: No  . Drug use: No    Ms.Clink reports that she has never smoked. She has never used smokeless tobacco. She reports that she does not drink alcohol or use drugs.  Tobacco Cessation: Never smoker  Past surgical hx, Family hx, Social hx all reviewed.  No current facility-administered medications on file prior to visit.   Current Outpatient Medications on File Prior to Visit  Medication Sig  . albuterol (VENTOLIN HFA) 108 (90 Base) MCG/ACT inhaler Inhale 2 puffs into the lungs every 6 (six) hours as needed for wheezing or shortness of breath.  . folic acid (FOLVITE) 1 MG tablet Take 1 mg by mouth daily.  . hydrochlorothiazide (MICROZIDE) 12.5 MG capsule Take 12.5 mg by mouth daily.  . methotrexate (RHEUMATREX) 2.5 MG tablet Take 12.5 mg by mouth once a week. Caution:Chemotherapy. Protect from light.  Pt takes 5 tablets every Wednesday.  . prednisoLONE acetate (PRED FORTE) 1 % ophthalmic suspension Place 1 drop into the right eye 2 (two) times daily. Reported on 04/16/2015 (Patient taking differently: Place 1 drop into the right eye 4  (four) times daily. Reported on 04/16/2015)     No Known Allergies  Review Of Systems:  Constitutional:   No  weight loss, night sweats,  Fevers, chills, fatigue, or  lassitude.  HEENT:   No headaches,  Difficulty swallowing,  Tooth/dental problems, or  Sore throat,                No sneezing, itching, ear ache, nasal congestion, post nasal drip,   CV:  No chest pain,  Orthopnea, PND, swelling in lower extremities, anasarca, dizziness, palpitations, syncope.   GI  No heartburn, indigestion, abdominal pain, nausea, vomiting, diarrhea, change in bowel habits, loss of appetite, bloody stools.   Resp: + shortness of breath with exertion and  at rest.  No excess mucus, no productive cough,  No non-productive cough,  No coughing up of blood.  No change in color of mucus.  No wheezing.  No chest wall deformity  Skin: no rash or lesions.  GU: no dysuria, change in color of urine, no urgency or frequency.  No flank pain, no hematuria   MS:  No joint pain or swelling.  No decreased range of motion.  No back pain.  Psych:  No change in mood or affect. No depression or anxiety.  No memory loss.   Vital Signs BP 132/70 (BP Location: Right Arm, Cuff Size: Large)   Pulse 90   Temp (!) 97.3 F (36.3 C) (Oral)   Ht 5\' 2"  (1.575 m)   Wt 141 lb 9.6 oz (64.2 kg)   SpO2 96%   BMI 25.90 kg/m    Physical Exam:  General- No distress,  A&Ox3, + accessory muscle use , in distress ENT: No sinus tenderness, TM clear, pale nasal mucosa, no oral exudate,no post nasal drip, no LAN Cardiac: S1, S2, regular rate and rhythm, no murmur Chest: No wheeze/ rales/ very diminished breath sounds per RUL and RML, ; + accessory muscle use, no nasal flaring, no sternal retractions Abd.: Soft Non-tender, ND, BS +, Body mass index is 25.9 kg/m. Ext: No clubbing cyanosis, edema Neuro:  MAE x 4, A&O x 3, physical deconditioning Skin: No rashes, No lesions, warm , dry, and intact Psych:  Anxious   Assessment/Plan 50% right sided tension pneumothorax 2/2 advanced sarcoidosis of the lungs Worsening shortness of breath since 2/9 Saturations of 70% on RA upon arrival to the office PLAN: - O2 at 4 L now - CXR now>> Right sided 50% tension pneumothorax - Transport to ED via EMS emergently - ED have been notified of the pending arrival and need for right sided chest tube placement upon arrival. - PCCM consult  -  As this is recurrent will need  talc chest tube pleurodesis vs. VATS ( Less likely) - TCTS referral  this admission, but doubt she is candidate for surgery as her lung tissue is so friable.  - Hospital Follow up in the office within a week of discharge - Order to DME for  portable oxygen in addition to what she already has.   Addendum 03/07/2019 Pt. Arrived safely to the ED. CCM was consulted for chest tube placement.  A large bore right chest tube was placed by Dr. Lynetta Mare to facilitate more complete evacuation of pneumothorax and to potentially cause more mechanical pleurodesis. She most likely will need  talc chest tube pleurodesis vs. VATS ( Less likely) this admission to prevent likelihood of recurrence. It is doubtful she  will  be a candidate for surgical intervention due to her advanced sarcoidosis and friable lung tissue.  This appointment was over 60  minutes long with over 50% of the time being direct face to face patient care, assessment ,review of imaging, coordinating  management of emergency transport and plan of care to the ED , and follow up.     Magdalen Spatz, NP 03/07/2019  10:35 AM

## 2019-03-06 NOTE — ED Triage Notes (Signed)
Pt bib ems from [facility] for sob since yesterday. Right sided pneumothorax noted on chest xray in office. Hx of same 2 weeks ago with chest tube placement. Pt denies chest pain, n/v. Pt initially 70s on room air upon arrival to office, pt arrives to ED on 4L [place] with saturations at 100%. Pt a.o, resp e.u at this time.

## 2019-03-06 NOTE — Procedures (Signed)
Chest Tube Insertion Procedure Note  Indications:  Clinically significant Pneumothorax  Pre-operative Diagnosis: Pneumothorax  Post-operative Diagnosis: Pneumothorax  Procedure Details  Informed consent was obtained for the procedure, including sedation.  Risks of lung perforation, hemorrhage, arrhythmia, and adverse drug reaction were discussed.   After sterile skin prep, using standard technique, a 20 French tube was placed in the right anterior 5 rib space.  Findings: Immediate rush of air obtained with fogging of chest tube.  Estimated Blood Loss:  Minimal         Specimens:  None              Complications:  None; patient tolerated the procedure well.         Disposition: PCU, hemodynamically stable         Condition: stable  Attending Attestation: I performed the procedure.  Kipp Brood, MD [facility] ICU Physician [place]  Pager: [phone] Mobile: [phone] After hours: [phone].  03/06/2019, 5:10 PM

## 2019-03-06 NOTE — Telephone Encounter (Signed)
Spoke with pt. She has been scheduled this afternoon with Sarah at 2:00pm. Order for CXR has already been placed. Nothing further was needed.

## 2019-03-06 NOTE — ED Provider Notes (Signed)
[facility] Provider Note   CSN: [id number] Arrival date & time: 03/06/19  1521     History Chief Complaint  Patient presents with  . Shortness of Breath    Lauren Massey is a 64 y.o. female.  HPI   Patient is a 64 year old female with a history of cataracts, sarcoidosis, who presents to the emergency department today for evaluation of shortness of breath.  Shortness of breath started yesterday.  She is also having some chest pain with this.  She has oxygen to use at home as needed.  She does not normally use it but yesterday was requiring 4 L at home.  States symptoms feel similar to when she has had a pneumothorax in the past.  She denies any other symptoms at this time.  Pulmonology: Dr. Annamaria Boots  Reviewed records per epic, patient was evaluated for pneumothorax on the right side 02/22/2019.  She was admitted at that time.  She was seen by her pulmonology NP today and was noted to have sats of 70% on room air.  She had a chest x-ray as an outpatient which revealed a large right pneumothorax and she was sent here for further evaluation.  Past Medical History:  Diagnosis Date  . Cataract    right eye  . Sarcoidosis    skin, eye, lung  . Unspecified iridocyclitis     Patient Active Problem List   Diagnosis Date Noted  . Recurrent pneumothorax 03/06/2019  . Pneumothorax 02/22/2019  . Spontaneous pneumothorax 02/22/2019  . Chronic respiratory failure with hypoxia ([place]) 03/17/2017  . Routine general medical examination at a health care facility 02/02/2017  . Loss of weight 08/03/2015  . SARCOIDOSIS, PULMONARY 05/18/2007  . Iridocyclitis 05/18/2007    Past Surgical History:  Procedure Laterality Date  . ABDOMINAL HYSTERECTOMY    . APPENDECTOMY    . BRONCHOSCOPY  2003  . CATARACT EXTRACTION    . VESICOVAGINAL FISTULA CLOSURE W/ TAH       OB History   No obstetric history on file.     Family History  Problem Relation Age of  Onset  . Cancer Father   . Heart disease Father   . Diabetes Other   . Colon cancer Neg Hx     Social History   Tobacco Use  . Smoking status: Never Smoker  . Smokeless tobacco: Never Used  Substance Use Topics  . Alcohol use: No  . Drug use: No    Home Medications Prior to Admission medications   Medication Sig Start Date End Date Taking? Authorizing Provider  albuterol (VENTOLIN HFA) 108 (90 Base) MCG/ACT inhaler Inhale 2 puffs into the lungs every 6 (six) hours as needed for wheezing or shortness of breath. 02/08/19  Yes Young, Tarri Fuller D, MD  folic acid (FOLVITE) 1 MG tablet Take 1 mg by mouth daily.   Yes [provider]  hydrochlorothiazide (MICROZIDE) 12.5 MG capsule Take 12.5 mg by mouth daily. 01/28/19  Yes [provider]  methotrexate (RHEUMATREX) 2.5 MG tablet Take 12.5 mg by mouth once a week. Caution:Chemotherapy. Protect from light.  Pt takes 5 tablets every Wednesday.   Yes [provider]  prednisoLONE acetate (PRED FORTE) 1 % ophthalmic suspension Place 1 drop into the right eye 2 (two) times daily. Reported on 04/16/2015 Patient taking differently: Place 1 drop into the right eye 4 (four) times daily. Reported on 04/16/2015 04/16/15  Yes Robyn Haber, MD    Allergies    Patient  has no known allergies.  Review of Systems   Review of Systems  Constitutional: Negative for fever.  HENT: Negative for ear pain and sore throat.   Eyes: Negative for visual disturbance.  Respiratory: Positive for cough (chronic) and shortness of breath.   Cardiovascular: Positive for chest pain. Negative for palpitations.  Gastrointestinal: Negative for abdominal pain, nausea and vomiting.  Genitourinary: Negative for dysuria and hematuria.  Musculoskeletal: Negative for back pain.  Skin: Negative for rash.  Neurological: Positive for headaches. Negative for seizures and syncope.  All other systems reviewed and are negative.   Physical Exam Updated  Vital Signs BP 117/75   Pulse 79   Temp 98.9 F (37.2 C) (Oral)   Resp 14   Ht 5\' 2"  (1.575 m)   Wt 64 kg   SpO2 94%   BMI 25.79 kg/m   Physical Exam Vitals and nursing note reviewed.  Constitutional:      General: She is not in acute distress.    Appearance: She is well-developed.  HENT:     Head: Normocephalic and atraumatic.  Eyes:     Conjunctiva/sclera: Conjunctivae normal.  Cardiovascular:     Rate and Rhythm: Normal rate and regular rhythm.     Heart sounds: No murmur.  Pulmonary:     Effort: Pulmonary effort is normal.     Breath sounds: Examination of the right-upper field reveals decreased breath sounds. Examination of the right-middle field reveals decreased breath sounds. Decreased breath sounds present. No wheezing, rhonchi or rales.  Abdominal:     Palpations: Abdomen is soft.     Tenderness: There is no abdominal tenderness.  Musculoskeletal:     Cervical back: Neck supple.  Skin:    General: Skin is warm and dry.  Neurological:     Mental Status: She is alert.     ED Results / Procedures / Treatments   Labs (all labs ordered are listed, but only abnormal results are displayed) Labs Reviewed  BASIC METABOLIC PANEL - Abnormal; Notable for the following components:      Result Value   Glucose, Bld 106 (*)    All other components within normal limits  RESPIRATORY PANEL BY RT PCR (FLU A&B, COVID)  CBC WITH DIFFERENTIAL/PLATELET  PROTIME-INR    EKG None  Radiology DG Chest 2 View  Addendum Date: 03/06/2019   ADDENDUM REPORT: 03/06/2019 14:44 ADDENDUM: Critical Value/emergent results were called by telephone at the time of interpretation on 03/06/2019 at 2:44 pm to provider [facility] , who verbally acknowledged these results. Electronically Signed   By: Lorriane Shire M.D.   On: 03/06/2019 14:44   Result Date: 03/06/2019 CLINICAL DATA:  Pneumothorax.  Pulmonary sarcoidosis. EXAM: CHEST - 2 VIEW COMPARISON:  03/01/2019 FINDINGS: There is a 50%  right tension pneumothorax with slight shift of the mediastinal structures to the left. Severe chronic changes of pulmonary sarcoidosis are stable. Heart size is normal. Enlargement of the main pulmonary artery could be secondary to pulmonary arterial hypertension. The lungs are hyperinflated chronically. Aortic atherosclerosis. No acute bone abnormality. IMPRESSION: 1. 50% right tension pneumothorax. 2. Stable chronic pulmonary sarcoidosis. 3.  Aortic Atherosclerosis (ICD10-I70.0). Electronically Signed: By: Lorriane Shire M.D. On: 03/06/2019 14:40   DG Chest Port 1 View  Result Date: 03/06/2019 CLINICAL DATA:  Right chest tube in place. EXAM: PORTABLE CHEST 1 VIEW COMPARISON:  Radiograph earlier this day. FINDINGS: Placement of right-sided chest tube with tip directed towards the apex in the mid upper lung. Decreased size  of right pneumothorax with small residual apical laterally, less than 10%. Subcutaneous emphysema in the right lateral chest wall. Underlying irregular pulmonary opacities consistent with sarcoidosis, not significantly changed. There is decreased mediastinal shift from prior. Heart size is stable. No left pneumothorax. IMPRESSION: 1. Placement of right-sided chest tube with decreased size of right pneumothorax, small residual apical laterally approximately 10%. Decreased mediastinal shift from prior. 2. Underlying chronic changes of sarcoidosis. Electronically Signed   By: Keith Rake M.D.   On: 03/06/2019 17:28    Procedures Procedures (including critical care time) CRITICAL CARE Performed by: Rodney Booze   Total critical care time: 33 minutes  Critical care time was exclusive of separately billable procedures and treating other patients.  Critical care was necessary to treat or prevent imminent or life-threatening deterioration.  Critical care was time spent personally by me on the following activities: development of treatment plan with patient and/or surrogate as  well as nursing, discussions with consultants, evaluation of patient's response to treatment, examination of patient, obtaining history from patient or surrogate, ordering and performing treatments and interventions, ordering and review of laboratory studies, ordering and review of radiographic studies, pulse oximetry and re-evaluation of patient's condition.   Medications Ordered in ED Medications  heparin injection 5,000 Units (5,000 Units Subcutaneous Given 03/06/19 1804)  albuterol (PROVENTIL) (2.5 MG/3ML) 0.083% nebulizer solution 2.5 mg (has no administration in time range)  folic acid (FOLVITE) tablet 1 mg (1 mg Oral Given 03/06/19 1803)  hydrochlorothiazide (MICROZIDE) capsule 12.5 mg (12.5 mg Oral Not Given 03/06/19 1759)  prednisoLONE acetate (PRED FORTE) 1 % ophthalmic suspension 1 drop (has no administration in time range)  acetaminophen (TYLENOL) tablet 650 mg (has no administration in time range)  ketorolac (TORADOL) 30 MG/ML injection 30 mg (has no administration in time range)  oxyCODONE (Oxy IR/ROXICODONE) immediate release tablet 5 mg (has no administration in time range)  acetaminophen (TYLENOL) tablet 650 mg (650 mg Oral Given 03/06/19 1611)  HYDROmorphone (DILAUDID) injection 1 mg (1 mg Intravenous Given 03/06/19 1631)  ondansetron (ZOFRAN) injection 4 mg (4 mg Intravenous Given 03/06/19 1615)  LORazepam (ATIVAN) injection 0.5 mg (0.5 mg Intravenous Given 03/06/19 1618)  0.9 %  sodium chloride infusion ( Intravenous New Bag/Given 03/06/19 1636)  lidocaine (PF) (XYLOCAINE) 1 % injection 30 mL (30 mLs Intradermal Given by Other 03/06/19 1630)  HYDROmorphone (DILAUDID) injection 0.5 mg (0.5 mg Intravenous Given 03/06/19 1703)  lidocaine (PF) (XYLOCAINE) 1 % injection 30 mL (30 mLs Intradermal Given by Other 03/06/19 1702)    ED Course  I have reviewed the triage vital signs and the nursing notes.  Pertinent labs & imaging results that were available during my care of the patient were  reviewed by me and considered in my medical decision making (see chart for details).    MDM Rules/Calculators/A&P                      64 year old female with history of sarcoid presenting for recurrent pneumothorax.  Admitted last month for pneumothorax.  Patient requiring 4 L O2, sats 94%.  Off oxygen sats drop to the 80s.  Noted to be 70% at home office prior to arrival.  Reviewed chest x-ray from outpatient x-ray-which showed 50% right tension pneumothorax and changes of sarcoidosis, agree with radiologist  4:05 PM CONSULT with Noe Gens with critical care who advises to complete chest tube with pigtail catheter, they will see the patient in the ED.  Critical care at bedside.  Dr. Lynetta Mare  At bedside, states he will complete the chest tube. Chest tube was place and xray ordered. They will f/u on these results. Labs pending on admission.   They state patient will be admitted to their service.    Final Clinical Impression(s) / ED Diagnoses Final diagnoses:  Pneumothorax, unspecified type    Rx / DC Orders ED Discharge Orders    None       Bishop Dublin 03/06/19 1912    Charlesetta Shanks, MD 03/08/19 1517

## 2019-03-06 NOTE — Telephone Encounter (Signed)
Forwarded to provider based on patient in office visit with Rexene Edison, NP on 03/01/19.  Tammy,  This patient was seen by you on Friday 03/01/19 due to pneumothorax. She had an xray the same day. And directed to call if her symptoms do not improve.  Patient stated that she went to work on Monday and felt fine, but on Tuesday she was starting to feel sluggish and last night had to put herself back on the oxygen.  Patient stated the shortness of breath and chest tightness will last at least 5 minutes after she put the O2 back on. She has it on 3L as the 2L recommended at the visit is not helping.  Patient stated that she does not feel right and feels like something is wrong with her body.  I told her I would forward the message to you and that if before I can call her back, if her SOB and chest tightness worsens to go to the ER.

## 2019-03-06 NOTE — ED Provider Notes (Signed)
Medical screening examination/treatment/procedure(s) were conducted as a shared visit with non-physician practitioner(s) and myself.  I personally evaluated the patient during the encounter.    Patient reports her symptoms gradually returned last night.  She reports she started to feel more short of breath but did not notice a sudden increase or sudden severe pain.  She has home oxygen so she put on some additional oxygen.  Today, outpatient chest x-ray showed recurrence of right pneumothorax.  Patient denies any cough or fever.  Patient is alert and clinically well in appearance.  No respiratory distress at rest.  Significantly diminished breath sounds on the right.  Heart regular no gross rub murmur gallop.  Abdomen soft and nontender.  Lower extremities warm and dry without any calf tenderness.  Initial plan was to place pigtail catheter.  However, upon arrival of pulmonology, they determined they wish to place a 20 French chest tube in order to subsequently be able to do pleurodesis if needed since this is a recurrent spontaneous pneumothorax.   Charlesetta Shanks, MD 03/06/19 [phone]

## 2019-03-06 NOTE — H&P (Addendum)
NAME:  Lauren Massey, MRN:  [id number], DOB:  07-05-1955, LOS: 0 ADMISSION DATE:  03/06/2019, CONSULTATION DATE:  2/10 REFERRING MD:  Dr. Johnney Killian, CHIEF COMPLAINT:  Pneumothorax   Brief History   64 year old female with sarcoidosis and recent admission for PTX, now presenting with recurrent PTX.   History of present illness   64 year old female with PMH as below, which is significant for chronic fibrotic sarcoidosis with multisystem involvement including lungs, skin, and eyes. Diagnosed via bronchoscopy in 2000. She is followed by Dr. Annamaria Boots. She was seen in the pulmonary clinic on 2/5 with no PTX on film.  She presented again to the pulmonary clinic with complaints of dyspnea on 2/10 and was noted to have decreased breath sounds on the R and hypoxia with O2 sats 70% on room air. CXR done in clinic showed large R sided PTX and she was sent to ED. Upon arrival to ED PCCM was called to admission and chest tube placement.   Past Medical History   has a past medical history of Cataract, Sarcoidosis, and Unspecified iridocyclitis.   Significant Hospital Events   Admit 2/10 for recurrent PTX  Consults:    Procedures:  Chest tube left 20 french 2/10 >  Significant Diagnostic Tests:    Micro Data:    Antimicrobials:     Interim history/subjective:    Objective   Blood pressure (!) 169/97, pulse 88, temperature 98.9 F (37.2 C), temperature source Oral, resp. rate 20, height 5\' 2"  (1.575 m), weight 64 kg, SpO2 99 %.       No intake or output data in the 24 hours ending 03/06/19 1620 Filed Weights   03/06/19 1526  Weight: 64 kg    Examination: General: middle aged female in NAD HENT: [place]/AT, PERRL no JVD Lungs: Clear, diminished R.  Cardiovascular: RRR, no MRG Abdomen: Soft, non-tender, non-distended Extremities: No acute deformity or ROM limitation.  Neuro: Alert, oriented, non-focal  Resolved Hospital Problem list     Assessment & Plan:   Recurrent pneumothorax  on the Right - Chest tube placed to suction 20cm H2O - CXR in AM - Suspect she will end up needing pleurodesis of some sort - Consult CVTS 2/11 - Pain management with APAP, Toradol, oxycodone. - Admit to PCU  Sarcoidosis of the lung. With eye and skin involvement as well. Followed by Dr. Annamaria Boots - PRN allbuterol - Methotrexate every Wednesday (not ordered this admission)  Iridocyclitis - continue pred-forte   Best practice:  Diet: regular diet Pain/Anxiety/Delirium protocol (if indicated):  VAP protocol (if indicated): NA DVT prophylaxis: SQ hep GI prophylaxis: NA Glucose control: NA Mobility: Up with assist Code Status: FULL (documented on discharge 2/1, currently under mod sedation would clarify 2/11.) Family Communication: Patient updated Disposition: PCU  Labs   CBC: No results for input(s): WBC, NEUTROABS, HGB, HCT, MCV, PLT in the last 168 hours.  Basic Metabolic Panel: No results for input(s): NA, K, CL, CO2, GLUCOSE, BUN, CREATININE, CALCIUM, MG, PHOS in the last 168 hours. GFR: Estimated Creatinine Clearance: 62.5 mL/min (by C-G formula based on SCr of 0.72 mg/dL). No results for input(s): PROCALCITON, WBC, LATICACIDVEN in the last 168 hours.  Liver Function Tests: No results for input(s): AST, ALT, ALKPHOS, BILITOT, PROT, ALBUMIN in the last 168 hours. No results for input(s): LIPASE, AMYLASE in the last 168 hours. No results for input(s): AMMONIA in the last 168 hours.  ABG No results found for: PHART, PCO2ART, PO2ART, HCO3, TCO2, ACIDBASEDEF,  O2SAT   Coagulation Profile: No results for input(s): INR, PROTIME in the last 168 hours.  Cardiac Enzymes: No results for input(s): CKTOTAL, CKMB, CKMBINDEX, TROPONINI in the last 168 hours.  HbA1C: No results found for: HGBA1C  CBG: No results for input(s): GLUCAP in the last 168 hours.  Review of Systems:    Bolds are positive  Constitutional: weight loss, gain, night sweats, Fevers, chills, fatigue .   HEENT: headaches, Sore throat, sneezing, nasal congestion, post nasal drip, Difficulty swallowing, Tooth/dental problems, visual complaints visual changes, ear ache CV:  chest pain, radiates:,Orthopnea, PND, swelling in lower extremities, dizziness, palpitations, syncope.  GI  heartburn, indigestion, abdominal pain, nausea, vomiting, diarrhea, change in bowel habits, loss of appetite, bloody stools.  Resp: cough, productive: , hemoptysis, dyspnea, chest pain, pleuritic.  Skin: rash or itching or icterus GU: dysuria, change in color of urine, urgency or frequency. flank pain, hematuria  MS: joint pain or swelling. decreased range of motion  Psych: change in mood or affect. depression or anxiety.  Neuro: difficulty with speech, weakness, numbness, ataxia    Past Medical History  She,  has a past medical history of Cataract, Sarcoidosis, and Unspecified iridocyclitis.   Surgical History    Past Surgical History:  Procedure Laterality Date  . ABDOMINAL HYSTERECTOMY    . APPENDECTOMY    . BRONCHOSCOPY  2003  . CATARACT EXTRACTION    . VESICOVAGINAL FISTULA CLOSURE W/ TAH       Social History   reports that she has never smoked. She has never used smokeless tobacco. She reports that she does not drink alcohol or use drugs.   Family History   Her family history includes Cancer in her father; Diabetes in an other family member; Heart disease in her father. There is no history of Colon cancer.   Allergies No Known Allergies   Home Medications  Prior to Admission medications   Medication Sig Start Date End Date Taking? Authorizing Provider  albuterol (VENTOLIN HFA) 108 (90 Base) MCG/ACT inhaler Inhale 2 puffs into the lungs every 6 (six) hours as needed for wheezing or shortness of breath. 02/08/19   Deneise Lever, MD  folic acid (FOLVITE) 1 MG tablet Take 1 mg by mouth daily.    [provider]  hydrochlorothiazide (MICROZIDE) 12.5 MG capsule Take 12.5 mg by mouth daily.  01/28/19   [provider]  methotrexate (RHEUMATREX) 2.5 MG tablet Take 12.5 mg by mouth once a week. Caution:Chemotherapy. Protect from light.  Pt takes 5 tablets every Wednesday.    [provider]  prednisoLONE acetate (PRED FORTE) 1 % ophthalmic suspension Place 1 drop into the right eye 2 (two) times daily. Reported on 04/16/2015 Patient taking differently: Place 1 drop into the right eye 4 (four) times daily. Reported on 04/16/2015 04/16/15   Robyn Haber, MD      Georgann Housekeeper, AGACNP-BC [place]  See Amion for personal pager PCCM on call pager [phone]  03/06/2019 5:35 PM

## 2019-03-06 NOTE — Telephone Encounter (Signed)
Needs a visit today with Dr. Annamaria Boots  Or APP for evaluation to make sure no return of Pnuemothorax or other etiology

## 2019-03-06 NOTE — ED Notes (Signed)
CCM at bedside for chest tube insertion

## 2019-03-07 ENCOUNTER — Encounter (HOSPITAL_COMMUNITY): Payer: Self-pay | Admitting: Pulmonary Disease

## 2019-03-07 ENCOUNTER — Inpatient Hospital Stay (HOSPITAL_COMMUNITY): Payer: 59

## 2019-03-07 ENCOUNTER — Encounter: Payer: Self-pay | Admitting: Acute Care

## 2019-03-07 DIAGNOSIS — R0602 Shortness of breath: Secondary | ICD-10-CM

## 2019-03-07 DIAGNOSIS — J939 Pneumothorax, unspecified: Secondary | ICD-10-CM

## 2019-03-07 DIAGNOSIS — J9311 Primary spontaneous pneumothorax: Secondary | ICD-10-CM

## 2019-03-07 MED ORDER — OXYCODONE HCL 5 MG PO TABS
10.0000 mg | ORAL_TABLET | ORAL | Status: DC | PRN
  Administered 2019-03-07 – 2019-03-10 (×11): 10 mg via ORAL
  Filled 2019-03-07 (×11): qty 2

## 2019-03-07 MED ORDER — OXYCODONE HCL 5 MG PO TABS
5.0000 mg | ORAL_TABLET | ORAL | Status: DC | PRN
  Administered 2019-03-07: 5 mg via ORAL
  Filled 2019-03-07: qty 1

## 2019-03-07 MED ORDER — LABETALOL HCL 5 MG/ML IV SOLN: 10.0000 mg | INTRAVENOUS | Status: DC | PRN

## 2019-03-07 NOTE — Progress Notes (Signed)
These results were reviewed with the patient at the time of the OV. EMS was called to transport patient to the ED at [facility] where a R CT was placed with resolution of pneumothorax.

## 2019-03-07 NOTE — Patient Instructions (Signed)
It is good to see you today Your CXR shows that your right lung has collapsed again.  We will call EMS to transport you to the ED at [facility]. You will have a chest tube placed to re-expand your right lung. As we talked about, you will most likely receive some kind of treatment for you recurrent lung collapse this admission We will need to see you within a week of your discharge to ensure you are doing well. We will place an order for portable oxygen.  Please contact office for sooner follow up if symptoms do not improve or worsen or seek emergency care

## 2019-03-07 NOTE — Consult Note (Addendum)
[address]       [place] [address]             [phone]        Lauren Massey [place] Medical Record [id number] Date of Birth: Dec 02, 1955  Referring: Pulm/CCM Primary Care: Hoyt Koch, MD Primary Cardiologist:No primary care provider on file.  Chief Complaint:    Chief Complaint  Patient presents with  . Shortness of Breath   History of Present Illness:      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  This was originally diagnosed in 2000.  She developed her first spontaneous pneumothorax about 2 weeks ago.  She required chest tube placement at that time and was discharged home after a brief uncomplicated hospital stay.  She presented to the Pulmonary clinic on 2/5 at which time she did not have a pneumothorax on CXR.  She developed symptoms of shortness of breath and again presented to the pulmonary clinic for evaluation.  On exam she was noted to have decreased breath sounds on the right.  CXR was obtained and showed a large right sided pneumothorax and she was referred to the ED for chest tube placement.  Cardiothoracic surgery consultation has been requested for possible VATS procedure.  The patient currently complains of pain.  She states she isn't getter pain medication very often and her pain is usually pretty severe before she is able to get another pain tablet.  She currently denies shortness of breath.  She denies any nicotine use in the past/present.  She does have oxygen at home, which she only uses on an as needed basis.  She states yesterday is the only time she has had to use it.    Current Activity/ Functional Status: Patient is independent with mobility/ambulation, transfers, ADL's, IADL's.   Zubrod Score: At the time of surgery this patient's most appropriate activity status/level should be described as: []     0    Normal activity, no  symptoms []     1    Restricted in physical strenuous activity but ambulatory, able to do out light work [x]     2    Ambulatory and capable of self care, unable to do work activities, up and about                 more than 50%  Of the time                            []     3    Only limited self care, in bed greater than 50% of waking hours []     4    Completely disabled, no self care, confined to bed or chair []     5    Moribund  Past Medical History:  Diagnosis Date  . Cataract    right eye  . Sarcoidosis    skin, eye, lung  . Unspecified iridocyclitis     Past Surgical History:  Procedure Laterality Date  . ABDOMINAL HYSTERECTOMY    . APPENDECTOMY    . BRONCHOSCOPY  2003  . CATARACT EXTRACTION    . VESICOVAGINAL FISTULA CLOSURE W/ TAH      Social History   Tobacco Use  Smoking Status Never Smoker  Smokeless Tobacco Never Used    Social History  Substance and Sexual Activity  Alcohol Use No     No Known Allergies  Current Facility-Administered Medications  Medication Dose Route Frequency Provider Last Rate Last Admin  . acetaminophen (TYLENOL) tablet 650 mg  650 mg Oral Q4H PRN Corey Harold, NP   650 mg at 03/07/19 0634  . albuterol (PROVENTIL) (2.5 MG/3ML) 0.083% nebulizer solution 2.5 mg  2.5 mg Inhalation Q6H PRN Corey Harold, NP      . folic acid (FOLVITE) tablet 1 mg  1 mg Oral Daily Corey Harold, NP   1 mg at 03/07/19 0941  . heparin injection 5,000 Units  5,000 Units Subcutaneous Q8H Corey Harold, NP   5,000 Units at 03/07/19 [id number]  . hydrochlorothiazide (MICROZIDE) capsule 12.5 mg  12.5 mg Oral Daily Corey Harold, NP   12.5 mg at 03/07/19 0940  . ketorolac (TORADOL) 30 MG/ML injection 30 mg  30 mg Intravenous Q8H PRN Corey Harold, NP   30 mg at 03/07/19 0942  . labetalol (NORMODYNE) injection 10 mg  10 mg Intravenous Q10 min PRN Agarwala, Einar Grad, MD      . oxyCODONE (Oxy IR/ROXICODONE) immediate release tablet 5 mg  5 mg Oral Q3H PRN Barrett,  Erin R, PA-C      . prednisoLONE acetate (PRED FORTE) 1 % ophthalmic suspension 1 drop  1 drop Right Eye BID Corey Harold, NP   1 drop at 03/07/19 0940    Medications Prior to Admission  Medication Sig Dispense Refill Last Dose  . albuterol (VENTOLIN HFA) 108 (90 Base) MCG/ACT inhaler Inhale 2 puffs into the lungs every 6 (six) hours as needed for wheezing or shortness of breath. 18 g 12 Past Month at Unknown time  . folic acid (FOLVITE) 1 MG tablet Take 1 mg by mouth daily.   03/06/2019 at Unknown time  . hydrochlorothiazide (MICROZIDE) 12.5 MG capsule Take 12.5 mg by mouth daily.   03/06/2019 at Unknown time  . methotrexate (RHEUMATREX) 2.5 MG tablet Take 12.5 mg by mouth once a week. Caution:Chemotherapy. Protect from light.  Pt takes 5 tablets every Wednesday.   Past Week at Unknown time  . prednisoLONE acetate (PRED FORTE) 1 % ophthalmic suspension Place 1 drop into the right eye 2 (two) times daily. Reported on 04/16/2015 (Patient taking differently: Place 1 drop into the right eye 4 (four) times daily. Reported on 04/16/2015) 5 mL 1 03/06/2019 at Unknown time    Family History  Problem Relation Age of Onset  . Cancer Father   . Heart disease Father   . Diabetes Other   . Colon cancer Neg Hx     Review of Systems:     Cardiac Review of Systems: Y or  [    ]= no  Chest Pain [  N  ]  Resting SOB [ Y resolved since chest tube placement  ] Exertional SOB  [ y ]  Orthopnea [  ]   Pedal Edema [   ]    Palpitations [  ] Syncope  [  ]   Presyncope [   ]  General Review of Systems: [Y] = yes [  ]=no Constitional: recent weight change [  ]; anorexia [  ]; fatigue [  ]; nausea [  ]; night sweats [  ]; fever [  ]; or chills [  ]  Dental: Last Dentist visit:   Eye : blurred vision [  ]; diplopia [   ]; vision changes [Y  ];  Amaurosis fugax[  ]; Resp: cough [  ];  wheezing[  ];  hemoptysis[  ]; shortness of breath[ Y ]; paroxysmal  nocturnal dyspnea[  ]; dyspnea on exertion[ y ]; or orthopnea[  ];  GI:  gallstones[  ], vomiting[  ];  dysphagia[  ]; melena[  ];  hematochezia [  ]; heartburn[  ];   Hx of  Colonoscopy[  ]; GU: kidney stones [  ]; hematuria[  ];   dysuria [  ];  nocturia[  ];  history of     obstruction [  ]; urinary frequency [  ]             Skin: rash, swelling[Y  ];, hair loss[  ];  peripheral edema[  ];  or itching[  ]; Musculosketetal: myalgias[  ];  joint swelling[  ];  joint erythema[  ];  joint pain[  ];  back pain[  ];  Heme/Lymph: bruising[  ];  bleeding[  ];  anemia[  ];  Neuro: TIA[  ];  headaches[  ];  stroke[  ];  vertigo[  ];  seizures[  ];   paresthesias[  ];  difficulty walking[  ];  Psych:depression[ N ]; anxiety[ N ];  Endocrine: diabetes[ N ];  thyroid dysfunction[N  ];  Physical Exam: BP 139/65   Pulse 63   Temp 99 F (37.2 C) (Oral)   Resp 17   Ht 5\' 2"  (1.575 m)   Wt 62.5 kg   SpO2 100%   BMI 25.20 kg/m    General appearance: alert, cooperative and no distress Head: Normocephalic, without obvious abnormality, atraumatic Resp: clear to auscultation bilaterally Cardio: regular rate and rhythm GI: soft, non-tender; bowel sounds normal; no masses,  no organomegaly Extremities: extremities normal, atraumatic, no cyanosis or edema Neurologic: Grossly normal Right chest tube in place with no air leak Diagnostic Studies & Laboratory data:     Recent Radiology Findings:  Result Date: 03/06/2019 CLINICAL DATA:  Pneumothorax.  Pulmonary sarcoidosis. EXAM: CHEST - 2 VIEW COMPARISON:  03/01/2019 FINDINGS: There is a 50% right tension pneumothorax with slight shift of the mediastinal structures to the left. Severe chronic changes of pulmonary sarcoidosis are stable. Heart size is normal. Enlargement of the main pulmonary artery could be secondary to pulmonary arterial hypertension. The lungs are hyperinflated chronically. Aortic atherosclerosis. No acute bone abnormality. IMPRESSION: 1.  50% right tension pneumothorax. 2. Stable chronic pulmonary sarcoidosis. 3.  Aortic Atherosclerosis (ICD10-I70.0). Electronically Signed: By: Lorriane Shire M.D. On: 03/06/2019 14:40  DG Chest Port 1 View  Result Date: 03/06/2019 CLINICAL DATA:  Right chest tube in place. EXAM: PORTABLE CHEST 1 VIEW COMPARISON:  Radiograph earlier this day. FINDINGS: Placement of right-sided chest tube with tip directed towards the apex in the mid upper lung. Decreased size of right pneumothorax with small residual apical laterally, less than 10%. Subcutaneous emphysema in the right lateral chest wall. Underlying irregular pulmonary opacities consistent with sarcoidosis, not significantly changed. There is decreased mediastinal shift from prior. Heart size is stable. No left pneumothorax. IMPRESSION: 1. Placement of right-sided chest tube with decreased size of right pneumothorax, small residual apical laterally approximately 10%. Decreased mediastinal shift from prior. 2. Underlying chronic changes of sarcoidosis. Electronically Signed   By: Keith Rake M.D.   On: 03/06/2019 17:28   CT CHEST WO CONTRAST  Result Date: 03/07/2019  CLINICAL DATA:  Pneumothorax. History of sarcoidosis. EXAM: CT CHEST WITHOUT CONTRAST TECHNIQUE: Multidetector CT imaging of the chest was performed following the standard protocol without IV contrast. COMPARISON:  Multiple prior radiographs most recently earlier this day. FINDINGS: Cardiovascular: Mild cardiomegaly. Aortic atherosclerosis. Mitral annulus calcifications. Enlarged main pulmonary artery at 3.8 cm. No pericardial effusion. Mediastinum/Nodes: Multifocal calcified mediastinal, hilar, and retrocrural lymph nodes consistent with sarcoidosis. Detailed assessment of adenopathy is limited in the absence of IV contrast. Esophagus slightly patulous. No esophageal wall thickening. Lungs/Pleura: Right chest tube in place which courses along the minor fissure, tip in the mid hemithorax. Minimal  intraluminal density in the mid and distal chest tube. There is a small right pneumothorax, greatest anteriorly measuring 18 mm. Small partially loculated pneumothorax component also seen laterally tracking in the fissure, and at the apex. Severe underlying chronic lung disease related sarcoidosis with bronchiectasis architectural distortion. Pulmonary cystic changes are greatest at the lung bases. Few scattered calcified granuloma within the lung parenchyma. No evidence of pulmonary edema. No pleural fluid. Upper Abdomen: No acute findings. Musculoskeletal: There are no acute or suspicious osseous abnormalities. Small amount of subcutaneous emphysema in the right chest wall adjacent to chest tube. IMPRESSION: 1. Small partially loculated right pneumothorax, greatest anteriorly. Right chest tube in place. Minimal intraluminal density in the mid and distal chest tube may be debris or hemorrhage. 2. Severe chronic lung disease related to sarcoidosis with bronchiectasis and cystic changes, greatest in the lung bases. 3. Cardiomegaly. Enlarged main pulmonary artery consistent with pulmonary arterial hypertension. 4. Calcified mediastinal, hilar, and retrocrural lymph nodes consistent with sarcoidosis. Aortic Atherosclerosis (ICD10-I70.0). Electronically Signed   By: Keith Rake M.D.   On: 03/07/2019 20:04   I have independently reviewed the above radiologic studies and discussed with the patient   Recent Lab Findings: Lab Results  Component Value Date   WBC 4.4 03/06/2019   HGB 12.1 03/06/2019   HCT 37.9 03/06/2019   PLT 285 03/06/2019   GLUCOSE 106 (H) 03/06/2019   CHOL 233 (A) 11/17/2018   TRIG 106 11/17/2018   HDL 77 (A) 11/17/2018   LDLCALC 18 11/17/2018   ALT 18 03/14/2016   AST 28 03/14/2016   NA 136 03/06/2019   K 4.1 03/06/2019   CL 100 03/06/2019   CREATININE 0.68 03/06/2019   BUN 10 03/06/2019   CO2 28 03/06/2019   TSH 1.87 08/03/2015   INR 1.0 03/06/2019    Assessment / Plan:       1. Recurrent pneumothorax, chest tube in place currently on suction- no air leak 2. Advance Sarcoidosis- CT scan done to further evaluate- Radiographic stage IV Fibrotic pulmonary sarcoidosis -end stage with indirect evidence of pulmonary hypertension on ct with dilated PA- PFT 03/16/17-moderately severe restriction, severe reduction of diffusion. FVC 1.62/66%, FEV1 1.46/76%, ratio 0.90, TLC 50%, DLCO 26%, last echocardiogram 2018 3. Pain control- will change oxycodone to every 3 hours  Suggest- cardiology evaluation R/O cardiac sarcoid and preop clearance consider VATS/robotic pleurectomy  Review case with pulmonary - has patient been considered/evaluated  for pulmonary transplant   Grace Isaac MD      [address] [place] [address] Office [phone]

## 2019-03-07 NOTE — Progress Notes (Signed)
NAME:  Lauren Massey, MRN:  [id number], DOB:  1955-02-12, LOS: 1 ADMISSION DATE:  03/06/2019, CONSULTATION DATE:  2/10 REFERRING MD:  Dr. Johnney Killian, CHIEF COMPLAINT:  Pneumothorax   Brief History   64 year old female with sarcoidosis and recent admission for PTX, now presenting with recurrent PTX.   History of present illness   64 year old female with PMH as below, which is significant for chronic fibrotic sarcoidosis with multisystem involvement including lungs, skin, and eyes. Diagnosed via bronchoscopy in 2000. She is followed by Dr. Annamaria Boots. She was seen in the pulmonary clinic on 2/5 with no PTX on film.  She presented again to the pulmonary clinic with complaints of dyspnea on 2/10 and was noted to have decreased breath sounds on the R and hypoxia with O2 sats 70% on room air. CXR done in clinic showed large R sided PTX and she was sent to ED. Upon arrival to ED PCCM was called to admission and chest tube placement.   Past Medical History   has a past medical history of Cataract, Sarcoidosis, and Unspecified iridocyclitis.   Significant Hospital Events   Admit 2/10 for recurrent PTX  Consults:  CVTS  Procedures:  Chest tube left 20 french 2/10 >  Significant Diagnostic Tests:    Micro Data:    Antimicrobials:     Interim history/subjective:  Feeling much better today. Pain well controlled.   Objective   Blood pressure 111/68, pulse 62, temperature 98.1 F (36.7 C), temperature source Oral, resp. rate 13, height 5\' 2"  (1.575 m), weight 62.5 kg, SpO2 99 %.        Intake/Output Summary (Last 24 hours) at 03/07/2019 1159 Last data filed at 03/07/2019 0657 Gross per 24 hour  Intake -  Output 40 ml  Net -40 ml   Filed Weights   03/06/19 1526 03/06/19 2146  Weight: 64 kg 62.5 kg    Examination:  General: middle aged female in NAD HENT: [place]/AT, PERRL, no JVD Lungs: rales predominantly in the bases. No air leak on [place] Cardiovascular: RRR, no MRG Abdomen: soft,  non-tender Extremities: No acute deformity or ROM limitation.  Neuro: Alert, oriented, non-focal  Resolved Hospital Problem list     Assessment & Plan:   Recurrent pneumothorax on the Right - Chest tube placed to suction 20cm H2O and continue for at least 24 more hours. PTX not fully resolved.  - CXR in AM - Suspect she will end up needing pleurodesis of some sort - Will call CVTS - Pain management with APAP, Toradol, oxycodone. - Admit to PCU  Sarcoidosis of the lung. With eye and skin involvement as well. Followed by Dr. Annamaria Boots - PRN allbuterol - Methotrexate every Wednesday (not ordered this admission)  Iridocyclitis - continue pred-forte   Best practice:  Diet: regular diet Pain/Anxiety/Delirium protocol (if indicated):  VAP protocol (if indicated): NA DVT prophylaxis: SQ hep GI prophylaxis: NA Glucose control: NA Mobility: Up with assist Code Status: FULL  Family Communication: Patient updated Disposition: PCU  Labs   CBC: Recent Labs  Lab 03/06/19 1610  WBC 4.4  NEUTROABS 2.7  HGB 12.1  HCT 37.9  MCV 89.4  PLT [id number]    Basic Metabolic Panel: Recent Labs  Lab 03/06/19 1730  NA 136  K 4.1  CL 100  CO2 28  GLUCOSE 106*  BUN 10  CREATININE 0.68  CALCIUM 9.0   GFR: Estimated Creatinine Clearance: 61.8 mL/min (by C-G formula based on SCr of 0.68 mg/dL). Recent  Labs  Lab 03/06/19 1610  WBC 4.4    Liver Function Tests: No results for input(s): AST, ALT, ALKPHOS, BILITOT, PROT, ALBUMIN in the last 168 hours. No results for input(s): LIPASE, AMYLASE in the last 168 hours. No results for input(s): AMMONIA in the last 168 hours.  ABG No results found for: PHART, PCO2ART, PO2ART, HCO3, TCO2, ACIDBASEDEF, O2SAT   Coagulation Profile: Recent Labs  Lab 03/06/19 1719  INR 1.0    Cardiac Enzymes: No results for input(s): CKTOTAL, CKMB, CKMBINDEX, TROPONINI in the last 168 hours.  HbA1C: No results found for: HGBA1C  CBG: No results for  input(s): GLUCAP in the last 168 hours.     Georgann Housekeeper, AGACNP-BC [place]  See Amion for personal pager PCCM on call pager [phone]  03/07/2019 11:59 AM

## 2019-03-08 ENCOUNTER — Inpatient Hospital Stay (HOSPITAL_COMMUNITY): Payer: 59

## 2019-03-08 DIAGNOSIS — J984 Other disorders of lung: Secondary | ICD-10-CM

## 2019-03-08 DIAGNOSIS — Z01818 Encounter for other preprocedural examination: Secondary | ICD-10-CM

## 2019-03-08 DIAGNOSIS — D869 Sarcoidosis, unspecified: Secondary | ICD-10-CM

## 2019-03-08 DIAGNOSIS — I361 Nonrheumatic tricuspid (valve) insufficiency: Secondary | ICD-10-CM

## 2019-03-08 LAB — BRAIN NATRIURETIC PEPTIDE: B Natriuretic Peptide: 121.4 pg/mL — ABNORMAL HIGH (ref 0.0–100.0)

## 2019-03-08 LAB — TROPONIN I (HIGH SENSITIVITY)
Troponin I (High Sensitivity): 3 ng/L (ref <18)
Troponin I (High Sensitivity): 3 ng/L (ref <18)

## 2019-03-08 LAB — ECHOCARDIOGRAM COMPLETE
Height: 62 in
Weight: 2218.71 oz

## 2019-03-08 NOTE — Plan of Care (Signed)

## 2019-03-08 NOTE — Progress Notes (Signed)
  Echocardiogram 2D Echocardiogram has been performed.  Lauren Massey 03/08/2019, 2:49 PM

## 2019-03-08 NOTE — Progress Notes (Addendum)
      [address]       [place] [address]             [phone]           Subjective: Patient states her breathing is better with oxygen. She does have pain at chest tube site.  Objective: Vital signs in last 24 hours: Temp:  [97.8 F (36.6 C)-99 F (37.2 C)] 97.8 F (36.6 C) (02/11 2331) Pulse Rate:  [62-72] 72 (02/12 0400) Cardiac Rhythm: Normal sinus rhythm (02/11 1920) Resp:  [11-18] 15 (02/12 0400) BP: (111-144)/(65-90) 140/80 (02/12 0400) SpO2:  [98 %-100 %] 98 % (02/12 0400) Weight:  [62.9 kg] 62.9 kg (02/12 0500)     Intake/Output from previous day: No intake/output data recorded.   Physical Exam:  Cardiovascular: RRR Pulmonary: Somewhat diminished but clear Chest Tube: to suction, no air leak  Lab Results: CBC: Recent Labs    03/06/19 1610  WBC 4.4  HGB 12.1  HCT 37.9  PLT 285   BMET:  Recent Labs    03/06/19 1730  NA 136  K 4.1  CL 100  CO2 28  GLUCOSE 106*  BUN 10  CREATININE 0.68  CALCIUM 9.0    PT/INR:  Recent Labs    03/06/19 1719  LABPROT 12.9  INR 1.0   ABG:  INR: Will add last result for INR, ABG once components are confirmed Will add last 4 CBG results once components are confirmed  Assessment/Plan:  1. CV - SR 2.  Pulmonary - On 1 liter of oxygen via [place]. Chest tube for recurrent pneumothorax. Chest tube is to suction, no air leak. CT chest done yesterday showed small partially loculated right pneumothorax, severe chronic lung disease related to sarcoidosis with bronchiectasis and cystic changes, cardiomegaly, and calcified mediastinal, hilar, and retrocrural lymph nodes consistent with sarcoidosis. Dr. Servando Snare to review CT and discuss treatment options  Chest tube and pain management per pulmonary  Sharalyn Ink [facility] 03/08/2019,7:11 AM [id number]  Long discussion with patient and daughter about approaches to prevent recurrent PTX- with the degree of cystic disease - have discussed  pleurectomy vs talc pleurodesis. I expressed to patient pleurectomy more likely to be effective , more effective then talc pleurdesis via chest tube . Considering surgery Monday - or Tuesday , Patient considering options   I have seen and examined Brailyn C Wyss and agree with the above assessment  and plan.  Grace Isaac MD [place] [phone] Office [phone] 03/08/2019 1:44 PM

## 2019-03-08 NOTE — Progress Notes (Signed)
NAME:  Lauren Massey, MRN:  [id number], DOB:  1955-08-17, LOS: 2 ADMISSION DATE:  03/06/2019, CONSULTATION DATE:  2/10 REFERRING MD:  Dr. Johnney Killian, CHIEF COMPLAINT:  Pneumothorax   Brief History   64 year old female with sarcoidosis and recent admission for PTX, now presenting with recurrent PTX.   History of present illness   64 year old female with PMH as below, which is significant for chronic fibrotic sarcoidosis with multisystem involvement including lungs, skin, and eyes. Diagnosed via bronchoscopy in 2000. She is followed by Dr. Annamaria Boots. She was seen in the pulmonary clinic on 2/5 with no PTX on film.  She presented again to the pulmonary clinic with complaints of dyspnea on 2/10 and was noted to have decreased breath sounds on the R and hypoxia with O2 sats 70% on room air. CXR done in clinic showed large R sided PTX and she was sent to ED. Upon arrival to ED PCCM was called to admission and chest tube placement.   Past Medical History   has a past medical history of Cataract, Sarcoidosis, and Unspecified iridocyclitis.   Significant Hospital Events   Admit 2/10 for recurrent PTX  Consults:  CTS PCCM  Procedures:  Chest tube left 20 french 2/10 >  Significant Diagnostic Tests:  CT Chest 03/08/19 - Right chest tube in place. Small anterior right pneumothorax and small apical pneumothorax. Severe chronic cystic lung disease in the lower lobes  Micro Data:    Antimicrobials:     Interim history/subjective:  Denies shortness of breath or chest pain.   Objective   Blood pressure (!) 149/82, pulse 68, temperature 99.3 F (37.4 C), temperature source Oral, resp. rate 19, height 5\' 2"  (1.575 m), weight 62.9 kg, SpO2 100 %.       No intake or output data in the 24 hours ending 03/08/19 1057 Filed Weights   03/06/19 1526 03/06/19 2146 03/08/19 0500  Weight: 64 kg 62.5 kg 62.9 kg   Physical Exam: General: Well-appearing, younger appearing than stated age, no acute  distress HENT: [place], AT, OP clear, MMM Eyes: EOMI, no scleral icterus Respiratory: Clear to auscultation bilaterally.  No crackles, wheezing or rales. Right chest tube in place, no air leak. Cardiovascular: RRR, -M/R/G, no JVD Extremities:-Edema,-tenderness Neuro: AAO x4, CNII-XII grossly intact Psych: Normal mood, normal affect  Resolved Hospital Problem list     Assessment & Plan:   Recurrent right pneumothorax in the setting of severe chronic cystic lung disease related to sarcoid Discussed plan with CTS who will likely plan for VATS/robotic pleurectomy. - Cardiology consulted for pre-op clearance - Continue chest tube placed to suction 20cm H2O - CXR tomorrow AM - Pain management with APAP, Toradol, oxycodone.  Sarcoidosis of the lung. With eye and skin involvement - Followed by Dr. Annamaria Boots Restrictive lung disease - FVC 1.62/66%, FEV1 1.46/76%, ratio 0.90, TLC 50%, DLCO 26% Discussed with patient regarding potential need for bilateral lung transplant due to the severity of her lung disease. Prior to this hospitalization, patient was not oxygen dependent and active at home without assistance. She may consider transplant in the future however would like to discuss this with family first. - PRN albuterol - Methotrexate every Wednesday (not ordered this admission) - Consider referral for transplant as an outpatient  Iridocyclitis - continue pred-forte   Best practice:  Diet: regular diet Pain/Anxiety/Delirium protocol (if indicated):  VAP protocol (if indicated): NA DVT prophylaxis: SQ hep GI prophylaxis: NA Glucose control: NA Mobility: Up with assist Code  Status: FULL (documented on discharge 2/1, currently under mod sedation would clarify 2/11.) Family Communication: Updated patient Disposition: PCU  Labs   CBC: Recent Labs  Lab 03/06/19 1610  WBC 4.4  NEUTROABS 2.7  HGB 12.1  HCT 37.9  MCV 89.4  PLT [id number]    Basic Metabolic Panel: Recent Labs  Lab  03/06/19 1730  NA 136  K 4.1  CL 100  CO2 28  GLUCOSE 106*  BUN 10  CREATININE 0.68  CALCIUM 9.0   GFR: Estimated Creatinine Clearance: 61.9 mL/min (by C-G formula based on SCr of 0.68 mg/dL). Recent Labs  Lab 03/06/19 1610  WBC 4.4    Liver Function Tests: No results for input(s): AST, ALT, ALKPHOS, BILITOT, PROT, ALBUMIN in the last 168 hours. No results for input(s): LIPASE, AMYLASE in the last 168 hours. No results for input(s): AMMONIA in the last 168 hours.  ABG No results found for: PHART, PCO2ART, PO2ART, HCO3, TCO2, ACIDBASEDEF, O2SAT   Coagulation Profile: Recent Labs  Lab 03/06/19 1719  INR 1.0    Cardiac Enzymes: No results for input(s): CKTOTAL, CKMB, CKMBINDEX, TROPONINI in the last 168 hours.  HbA1C: No results found for: HGBA1C  CBG: No results for input(s): GLUCAP in the last 168 hours.   Rodman Pickle, M.D. [facility] Pulmonary/Critical Care Medicine 03/08/2019 11:26 AM

## 2019-03-08 NOTE — Consult Note (Addendum)
Cardiology Consultation:   Patient ID: Lauren Massey MRN: [id number]; DOB: Sep 01, 1955  Admit date: 03/06/2019 Date of Consult: 03/08/2019  Primary Care Provider: Hoyt Koch, MD Primary Cardiologist: No primary care provider on file.  Primary Electrophysiologist:  None    Patient Profile:   Lauren Massey is a 64 y.o. female with a hx of cataracts, HTN, and sarcoidosis who is being seen today for the evaluation of pre-op evaluation at the request of Dr. Loanne Drilling.  History of Present Illness:   Ms. Kirchberg has not been seen by general cardiology in the past. 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 She was diagnosed via bronchoscopy in 2000 and is followed by Dr. Annamaria Boots.  Patient has a h/o of HTN on HCTZ. Denies history of HLD, MI, stent, heart failure, or arrythmia. She had an echo in 218 showing EF 55-60%, mild focal basal hypertrophy, no WMA, G1DD, trivial MR, PA peak pressure 37 mm Hg  The patient presented to the ED 03/06/19 for shortness of breath which started the day before. She went to her pulmonologist on 2/5 with no PTX on film. She went back 2/10 noted the patient's sats were in the 62s. CXR was ordered which showed a right pneumothorax. It appears the patient was admitted 02/22/19 for a rt ptx. CCM was consulted and a large bore chest tube was inserted. The patient was admitted for recurrent ptx. CVTS was consulted for possible procedure who would requested cardiology pre-op eval to rule out cardaic sarcoid.   Heart Pathway Score:     Past Medical History:  Diagnosis Date  . Cataract    right eye  . Sarcoidosis    skin, eye, lung  . Unspecified iridocyclitis     Past Surgical History:  Procedure Laterality Date  . ABDOMINAL HYSTERECTOMY    . APPENDECTOMY    . BRONCHOSCOPY  2003  . CATARACT EXTRACTION    . VESICOVAGINAL FISTULA CLOSURE W/ TAH       Home Medications:  Prior to Admission  medications   Medication Sig Start Date End Date Taking? Authorizing Provider  albuterol (VENTOLIN HFA) 108 (90 Base) MCG/ACT inhaler Inhale 2 puffs into the lungs every 6 (six) hours as needed for wheezing or shortness of breath. 02/08/19  Yes Young, Tarri Fuller D, MD  folic acid (FOLVITE) 1 MG tablet Take 1 mg by mouth daily.   Yes [provider]  hydrochlorothiazide (MICROZIDE) 12.5 MG capsule Take 12.5 mg by mouth daily. 01/28/19  Yes [provider]  methotrexate (RHEUMATREX) 2.5 MG tablet Take 12.5 mg by mouth once a week. Caution:Chemotherapy. Protect from light.  Pt takes 5 tablets every Wednesday.   Yes [provider]  prednisoLONE acetate (PRED FORTE) 1 % ophthalmic suspension Place 1 drop into the right eye 2 (two) times daily. Reported on 04/16/2015 Patient taking differently: Place 1 drop into the right eye 4 (four) times daily. Reported on 04/16/2015 04/16/15  Yes Robyn Haber, MD    Inpatient Medications: Scheduled Meds: . folic acid  1 mg Oral Daily  . heparin  5,000 Units Subcutaneous Q8H  . hydrochlorothiazide  12.5 mg Oral Daily  . prednisoLONE acetate  1 drop Right Eye BID   Continuous Infusions:  PRN Meds: acetaminophen, albuterol, ketorolac, labetalol, oxyCODONE  Allergies:   No Known Allergies  Social History:   Social History   Socioeconomic History  . Marital status: Married    Spouse name: Not on file  . Number  of children: 3  . Years of education: Not on file  . Highest education level: Not on file  Occupational History  . 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 Smoking status: Never Smoker  . Smokeless tobacco: Never Used  Substance and Sexual Activity  . Alcohol use: No  . Drug use: No  . Sexual activity: Not on file  Other Topics Concern  . Not on file  Social History Narrative  . Not on file   Social Determinants of Health   Financial Resource Strain:   . Difficulty of Paying  Living Expenses: Not on file  Food Insecurity:   . Worried About Charity fundraiser in the Last Year: Not on file  . Ran Out of Food in the Last Year: Not on file  Transportation Needs:   . Lack of Transportation (Medical): Not on file  . Lack of Transportation (Non-Medical): Not on file  Physical Activity:   . Days of Exercise per Week: Not on file  . Minutes of Exercise per Session: Not on file  Stress:   . Feeling of Stress : Not on file  Social Connections:   . Frequency of Communication with Friends and Family: Not on file  . Frequency of Social Gatherings with Friends and Family: Not on file  . Attends Religious Services: Not on file  . Active Member of Clubs or Organizations: Not on file  . Attends Archivist Meetings: Not on file  . Marital Status: Not on file  Intimate Partner Violence:   . Fear of Current or Ex-Partner: Not on file  . Emotionally Abused: Not on file  . Physically Abused: Not on file  . Sexually Abused: Not on file    Family History:   Family History  Problem Relation Age of Onset  . Cancer Father   . Heart disease Father   . Diabetes Other   . Colon cancer Neg Hx      ROS:  Please see the history of present illness.  All other ROS reviewed and negative.     Physical Exam/Data:   Vitals:   03/08/19 0400 03/08/19 0500 03/08/19 0727 03/08/19 1120  BP: 140/80  (!) 149/82 130/76  Pulse: 72  68 63  Resp: 15  19 19   Temp:   99.3 F (37.4 C) 97.9 F (36.6 C)  TempSrc:   Oral Oral  SpO2: 98%  100% 100%  Weight:  62.9 kg    Height:       No intake or output data in the 24 hours ending 03/08/19 1247 Last 3 Weights 03/08/2019 03/06/2019 03/06/2019  Weight (lbs) 138 lb 10.7 oz 137 lb 12.6 oz 141 lb  Weight (kg) 62.9 kg 62.5 kg 63.957 kg     Body mass index is 25.36 kg/m.  General:  Well nourished, well developed, in no acute distress HEENT: normal Lymph: no adenopathy Neck: no JVD Endocrine:  No thryomegaly Vascular: No carotid  bruits; FA pulses 2+ bilaterally without bruits  Cardiac:  normal S1, S2; RRR; systolic murmur  Lungs:  clear to auscultation bilaterally, no wheezing, rhonchi or rales  Abd: soft, nontender, no hepatomegaly  Ext: trace edema Musculoskeletal:  No deformities, BUE and BLE strength normal and equal Skin: warm and dry  Neuro:  CNs 2-12 intact, no focal abnormalities noted Psych:  Normal affect   EKG:  The EKG was personally reviewed and demonstrates:  pending Telemetry:  Telemetry was personally reviewed  and demonstrates:  NSR, Hr 60-70s, sinus tach vs atrial tach HR 120s  Relevant CV Studies:  Echo 2018 - Left ventricle: The cavity size was normal. There was mild focal  basal hypertrophy of the septum. Systolic function was normal.  The estimated ejection fraction was in the range of 55% to 60%.  Wall motion was normal; there were no regional wall motion  abnormalities. Doppler parameters are consistent with abnormal  left ventricular relaxation (grade 1 diastolic dysfunction).  - Aortic valve: There was no stenosis.  - Mitral valve: Moderately calcified annulus. There was trivial  regurgitation.  - Left atrium: The atrium was mildly dilated.  - Right ventricle: The cavity size was normal. Systolic function  was normal.  - Tricuspid valve: Peak RV-RA gradient (S): 29 mm Hg.  - Pulmonary arteries: PA peak pressure: 37 mm Hg (S).  - Systemic veins: IVC measured 2.0 cm with < 50% respirophasic  variation, suggesting RA pressure 8 mmHg.   Laboratory Data:  High Sensitivity Troponin:   Recent Labs  Lab 02/22/19 0248  TROPONINIHS 4     Chemistry Recent Labs  Lab 03/06/19 1730  NA 136  K 4.1  CL 100  CO2 28  GLUCOSE 106*  BUN 10  CREATININE 0.68  CALCIUM 9.0  GFRNONAA >60  GFRAA >60  ANIONGAP 8    No results for input(s): PROT, ALBUMIN, AST, ALT, ALKPHOS, BILITOT in the last 168 hours. Hematology Recent Labs  Lab 03/06/19 1610  WBC 4.4  RBC 4.24   HGB 12.1  HCT 37.9  MCV 89.4  MCH 28.5  MCHC 31.9  RDW 14.3  PLT 285   BNPNo results for input(s): BNP, PROBNP in the last 168 hours.  DDimer No results for input(s): DDIMER in the last 168 hours.   Radiology/Studies:  DG Chest 2 View  Addendum Date: 03/06/2019   ADDENDUM REPORT: 03/06/2019 14:44 ADDENDUM: Critical Value/emergent results were called by telephone at the time of interpretation on 03/06/2019 at 2:44 pm to provider [facility] , who verbally acknowledged these results. Electronically Signed   By: Lorriane Shire M.D.   On: 03/06/2019 14:44   Result Date: 03/06/2019 CLINICAL DATA:  Pneumothorax.  Pulmonary sarcoidosis. EXAM: CHEST - 2 VIEW COMPARISON:  03/01/2019 FINDINGS: There is a 50% right tension pneumothorax with slight shift of the mediastinal structures to the left. Severe chronic changes of pulmonary sarcoidosis are stable. Heart size is normal. Enlargement of the main pulmonary artery could be secondary to pulmonary arterial hypertension. The lungs are hyperinflated chronically. Aortic atherosclerosis. No acute bone abnormality. IMPRESSION: 1. 50% right tension pneumothorax. 2. Stable chronic pulmonary sarcoidosis. 3.  Aortic Atherosclerosis (ICD10-I70.0). Electronically Signed: By: Lorriane Shire M.D. On: 03/06/2019 14:40   CT CHEST WO CONTRAST  Result Date: 03/07/2019 CLINICAL DATA:  Pneumothorax. History of sarcoidosis. EXAM: CT CHEST WITHOUT CONTRAST TECHNIQUE: Multidetector CT imaging of the chest was performed following the standard protocol without IV contrast. COMPARISON:  Multiple prior radiographs most recently earlier this day. FINDINGS: Cardiovascular: Mild cardiomegaly. Aortic atherosclerosis. Mitral annulus calcifications. Enlarged main pulmonary artery at 3.8 cm. No pericardial effusion. Mediastinum/Nodes: Multifocal calcified mediastinal, hilar, and retrocrural lymph nodes consistent with sarcoidosis. Detailed assessment of adenopathy is limited in the  absence of IV contrast. Esophagus slightly patulous. No esophageal wall thickening. Lungs/Pleura: Right chest tube in place which courses along the minor fissure, tip in the mid hemithorax. Minimal intraluminal density in the mid and distal chest tube. There is a small right pneumothorax, greatest  anteriorly measuring 18 mm. Small partially loculated pneumothorax component also seen laterally tracking in the fissure, and at the apex. Severe underlying chronic lung disease related sarcoidosis with bronchiectasis architectural distortion. Pulmonary cystic changes are greatest at the lung bases. Few scattered calcified granuloma within the lung parenchyma. No evidence of pulmonary edema. No pleural fluid. Upper Abdomen: No acute findings. Musculoskeletal: There are no acute or suspicious osseous abnormalities. Small amount of subcutaneous emphysema in the right chest wall adjacent to chest tube. IMPRESSION: 1. Small partially loculated right pneumothorax, greatest anteriorly. Right chest tube in place. Minimal intraluminal density in the mid and distal chest tube may be debris or hemorrhage. 2. Severe chronic lung disease related to sarcoidosis with bronchiectasis and cystic changes, greatest in the lung bases. 3. Cardiomegaly. Enlarged main pulmonary artery consistent with pulmonary arterial hypertension. 4. Calcified mediastinal, hilar, and retrocrural lymph nodes consistent with sarcoidosis. Aortic Atherosclerosis (ICD10-I70.0). Electronically Signed   By: Keith Rake M.D.   On: 03/07/2019 20:04   DG Chest Port 1 View  Result Date: 03/07/2019 CLINICAL DATA:  Pneumothorax EXAM: PORTABLE CHEST 1 VIEW COMPARISON:  Radiograph 03/06/2019 FINDINGS: Right apically directed chest tube remains in place, appears slightly retracted from comparison exam there is persistent visceral pleural line in the right apex concerning for residual apical pneumothorax. Persistent subcutaneous emphysema over the right chest wall  slightly improved from comparison. Bilateral mixed interstitial and patchy airspace opacity is similar to prior accounting for slightly diminished volume in the left base. Cardiomediastinal contours are stable with a calcified aorta. There is mild nonspecific air distention of the bowel. No acute osseous abnormality. Degenerative changes are present in the imaged spine and shoulders. IMPRESSION: Slight retraction of a right apically directed chest tube. Persistent small right apical pneumothorax. Persistent subcutaneous emphysema over the right chest wall. Unchanged appearance of the bilateral interstitial and airspace opacities accounting for diminished left lung volume. Electronically Signed   By: Lovena Le M.D.   On: 03/07/2019 06:55   DG Chest Port 1 View  Result Date: 03/06/2019 CLINICAL DATA:  Right chest tube in place. EXAM: PORTABLE CHEST 1 VIEW COMPARISON:  Radiograph earlier this day. FINDINGS: Placement of right-sided chest tube with tip directed towards the apex in the mid upper lung. Decreased size of right pneumothorax with small residual apical laterally, less than 10%. Subcutaneous emphysema in the right lateral chest wall. Underlying irregular pulmonary opacities consistent with sarcoidosis, not significantly changed. There is decreased mediastinal shift from prior. Heart size is stable. No left pneumothorax. IMPRESSION: 1. Placement of right-sided chest tube with decreased size of right pneumothorax, small residual apical laterally approximately 10%. Decreased mediastinal shift from prior. 2. Underlying chronic changes of sarcoidosis. Electronically Signed   By: Keith Rake M.D.   On: 03/06/2019 17:28   {  Assessment and Plan:   Pre-op Evaluation Patient with h/o of advanced sarcoidosis presented with recurrent ptx. CVTS plan for VATS/robotic pleurectomy and need evaluation for r/o cardiac sarcoidosis.  - Patient has no significant cardiac h/o. She has HTN on HCTZ - Llast echo  in 2018 showed  mild focal basal hypertrophy, no WMA, G1DD, trivial MR, PA peak pressure 37 mm Hg - Telemetry shows brief possible atrial tachycardia HR int he 120s otherwise sinus with rates in the 60s.  - 02/22/19 EKG shows NSR PRI 176 ms, 87 bpm, nonspecific ST changes. EKG today - will repeat echo - Patient denies lower leg edema. She has chronic SOB at baseline and uses oxygen as needed.  Patient denies chest pain on exertion.  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  - Will check a troponin and BNP - will discuss with MD advanced cardiac imaging. Patient is not a good candidate for cardiac MRI given current respiratory status. After procedure Post-procedure would consider cardiac MRI to r/o cardiac sarcoidosis.  - [facility] Activity status Index 5.13 METS - Revised Cardiac Risk Index Class II risk, 6% 30 day risk of death, MI, or cardiac arrest -Echo shows normal LV systolic function, normal RV function, mild elevation in pulmonary pressures.  No further cardiac work-up recommended prior to surgery  For questions or updates, please contact [place] Please consult [email or web address] for contact info under    Signed, Cadence Ninfa Meeker, PA-C  03/08/2019 12:47 PM   Patient seen and examined.  Agree with above documentation.  Ms Castellani is a 64 year old woman with a history of pulmonary sarcoid who is being seen today for preop evaluation at the request of Dr. Loanne Drilling.  She has known sarcoid with involvement of lungs, skin, and eyes.  She presented to ED on 2/10 and noted to have O2 saturations in the 70s.  Chest x-ray showed right pneumothorax.  She was admitted and chest tube was placed.  She has now had recurrent pneumothoraces.  Thoracic surgery was consulted and considering VATS/robotic pleurectomy.  Cardiology consulted for preop evaluation.  She reports that she is active with her job working at [employer], she reports that she is on her feet all day and has to lift things frequently.  She denies any exertional chest pain or dyspnea.  For preop evaluation, no symptoms to suggest active cardiac condition.  Good functional capacity.  RCRI score is 1 (intrathoracic surgery), which corresponds to a 6% risk of death/MI/cardiac arrest in 30 days.  Echo today shows normal LV systolic function, normal RV function, mild PASP elevation.  No further cardiac work-up recommended prior to surgery.  For her sarcoidosis, will plan outpatient follow-up for cardiac MRI to evaluate for cardiac sarcoid.  Donato Heinz, MD

## 2019-03-08 NOTE — Plan of Care (Signed)
  Problem: Education: Goal: Knowledge of General Education information will improve Description Including pain rating scale, medication(s)/side effects and non-pharmacologic comfort measures Outcome: Progressing   Problem: Health Behavior/Discharge Planning: Goal: Ability to manage health-related needs will improve Outcome: Progressing   Problem: Clinical Measurements: Goal: Ability to maintain clinical measurements within normal limits will improve Outcome: Progressing Goal: Will remain free from infection Outcome: Progressing Goal: Diagnostic test results will improve Outcome: Progressing Goal: Respiratory complications will improve Outcome: Progressing Goal: Cardiovascular complication will be avoided Outcome: Progressing   Problem: Activity: Goal: Risk for activity intolerance will decrease Outcome: Progressing   Problem: Nutrition: Goal: Adequate nutrition will be maintained Outcome: Progressing   Problem: Elimination: Goal: Will not experience complications related to bowel motility Outcome: Progressing Goal: Will not experience complications related to urinary retention Outcome: Progressing   Problem: Pain Managment: Goal: General experience of comfort will improve Outcome: Progressing   Problem: Safety: Goal: Ability to remain free from injury will improve Outcome: Progressing   

## 2019-03-09 ENCOUNTER — Inpatient Hospital Stay (HOSPITAL_COMMUNITY): Payer: 59

## 2019-03-09 ENCOUNTER — Encounter (HOSPITAL_COMMUNITY): Payer: Self-pay | Admitting: Pulmonary Disease

## 2019-03-09 DIAGNOSIS — I471 Supraventricular tachycardia: Secondary | ICD-10-CM

## 2019-03-09 MED ORDER — SALINE SPRAY 0.65 % NA SOLN
1.0000 | NASAL | Status: DC | PRN
  Filled 2019-03-09: qty 44

## 2019-03-09 NOTE — Progress Notes (Addendum)
No new arrhythmic events on telemetry. Reviewed echo findings. Normal right and left ventricular function, no major valvular abnormalities, borderline PAP. No echo evidence of cardiac sarcoidosis. Preop cardiac evaluation per Dr. Newman Nickels note. [facility] will sign off.   Medication Recommendations:  No cardiac rec Other recommendations (labs, testing, etc):  Will arrange cardiac MRI at f/u Follow up as an outpatient:  Please notify us when ready for DC to arrange f/u.

## 2019-03-09 NOTE — Progress Notes (Signed)
      [address]       [place] [address]             [phone]         Procedure(s) (LRB): XI ROBOTIC ASSISTED THORASCOPY-PLEURECTOMY (Right)  Subjective: Patient eating breakfast this am. She states her nose is dried out from oxygen  Objective: Vital signs in last 24 hours: Temp:  [97.9 F (36.6 C)-98.7 F (37.1 C)] 98.3 F (36.8 C) (02/13 0846) Pulse Rate:  [58-67] 67 (02/13 0318) Cardiac Rhythm: Normal sinus rhythm (02/12 1912) Resp:  [12-19] 16 (02/13 0318) BP: (103-135)/(65-76) 135/66 (02/13 0318) SpO2:  [84 %-100 %] 99 % (02/13 0318)     Intake/Output from previous day: No intake/output data recorded.   Physical Exam:  Cardiovascular: RRR Pulmonary: Somewhat diminished bilaterally but clear Chest Tube: to suction, no air leak  Lab Results: CBC: Recent Labs    03/06/19 1610  WBC 4.4  HGB 12.1  HCT 37.9  PLT 285   BMET:  Recent Labs    03/06/19 1730  NA 136  K 4.1  CL 100  CO2 28  GLUCOSE 106*  BUN 10  CREATININE 0.68  CALCIUM 9.0    PT/INR:  Recent Labs    03/06/19 1719  LABPROT 12.9  INR 1.0   ABG:  INR: Will add last result for INR, ABG once components are confirmed Will add last 4 CBG results once components are confirmed  Assessment/Plan:  1. CV - SR. Cardiology evaluated yesterday. No further cardiac work up recommended prior to surgery. Cardiology to arrange MRI after discharge. 2.  Pulmonary - On 1-2 liters of oxygen via [place]. Chest tube for recurrent pneumothorax. Chest tube is to suction, no air leak. Patient likely will proceed with robotic assisted pleurectomy, which is scheduled for Tuesday 02/16. Ocean spray PRN 3. Management pre primary  Sharalyn Ink [facility] 03/09/2019,9:43 AM [phone]

## 2019-03-09 NOTE — Progress Notes (Signed)
NAME:  Lauren Massey, MRN:  [id number], DOB:  08/07/1955, LOS: 3 ADMISSION DATE:  03/06/2019, CONSULTATION DATE:  2/10 REFERRING MD:  Dr. Johnney Killian, CHIEF COMPLAINT:  Pneumothorax   Brief History   64 year old female with sarcoidosis and recent admission for PTX, now presenting with recurrent PTX.   History of present illness   64 year old female with PMH as below, which is significant for chronic fibrotic sarcoidosis with multisystem involvement including lungs, skin, and eyes. Diagnosed via bronchoscopy in 2000. She is followed by Dr. Annamaria Boots. She was seen in the pulmonary clinic on 2/5 with no PTX on film.  She presented again to the pulmonary clinic with complaints of dyspnea on 2/10 and was noted to have decreased breath sounds on the R and hypoxia with O2 sats 70% on room air. CXR done in clinic showed large R sided PTX and she was sent to ED. Upon arrival to ED PCCM was called to admission and chest tube placement.   Past Medical History   has a past medical history of Cataract, Sarcoidosis, and Unspecified iridocyclitis.   Significant Hospital Events   Admit 2/10 for recurrent PTX 2/12 - Denies shortness of breath or chest pain.   Consults:  CTS PCCM  Procedures:  Chest tube left 20 french 2/10 >  Significant Diagnostic Tests:  CT Chest 03/08/19 - Right chest tube in place. Small anterior right pneumothorax and small apical pneumothorax. 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  Lying in bed with head of bed at 45 degrees.  States she is just waiting for surgery in a few days.  Denies any complaints.  Is on nasal cannula oxygen seen by Dr. Pia Mau to test/12/21 and is recommended pleurectomy.  She tells me the current pneumothorax is a recurrence  Objective   Blood pressure 111/69, pulse 66, temperature 99.2 F (37.3 C), temperature source Oral,  resp. rate 16, height 5\' 2"  (1.575 m), weight 62.9 kg, SpO2 95 %.        Intake/Output Summary (Last 24 hours) at 03/09/2019 1629 Last data filed at 03/09/2019 1100 Gross per 24 hour  Intake --  Output 0 ml  Net 0 ml   Filed Weights   03/06/19 1526 03/06/19 2146 03/08/19 0500  Weight: 64 kg 62.5 kg 62.9 kg   Physical Exam: Pleasant female head of bed 45 degrees.  In the bed.  Playing solitaire on her cell phone.  No distress.  Lungs fairly clear to auscultation.  Bilateral equal breath sounds abdomen soft normal heart sounds.  Extremities intact.  Resolved Hospital Problem list     Assessment & Plan:   Recurrent right pneumothorax in the setting of severe chronic cystic lung disease related to sarcoid  -No change 03/09/2019  Plan  -Surgery in the next few days for pleurectomy -Continue chest tube to suction -Oxygen for pulse ox greater than 88%.   Sarcoidosis of the lung. With eye and skin involvement - Followed by Dr. Annamaria Boots Restrictive lung disease - FVC 1.62/66%, FEV1 1.46/76%, ratio 0.90, TLC 50%, DLCO 26%  03/09/2019: No active issues  Plan - PRN albuterol - Methotrexate every Wednesday (not ordered this admission) -might not be needed if his burned-out disease - Consider referral for transplant as an outpatient -Consider right heart catheterization  Iridocyclitis - continue pred-forte   Best practice:  Diet: regular diet Pain/Anxiety/Delirium  protocol (if indicated):  VAP protocol (if indicated): NA DVT prophylaxis: SQ hep GI prophylaxis: NA Glucose control: NA Mobility: Up with assist Code Status: FULL (documented on discharge 2/1, currently under mod sedation would clarify 2/11.) Family Communication: Updated patient DispositionL: 2w12     SIGNATURE    Dr. Brand Males, M.D., F.C.C.P,  Pulmonary and Critical Care Medicine Staff Physician, [place] Director - Interstitial Lung Disease  Program  [facility] [place], [place], [address]  Pager: [phone], If no answer or between  15:00h - 7:00h: call [phone] Telephone: [phone]  4:30 PM 03/09/2019     LABS    PULMONARY No results for input(s): PHART, PCO2ART, PO2ART, HCO3, TCO2, O2SAT in the last 168 hours.  Invalid input(s): PCO2, PO2  CBC Recent Labs  Lab 03/06/19 1610  HGB 12.1  HCT 37.9  WBC 4.4  PLT 285    COAGULATION Recent Labs  Lab 03/06/19 1719  INR 1.0    CARDIAC  No results for input(s): TROPONINI in the last 168 hours. No results for input(s): PROBNP in the last 168 hours.   CHEMISTRY Recent Labs  Lab 03/06/19 1730  NA 136  K 4.1  CL 100  CO2 28  GLUCOSE 106*  BUN 10  CREATININE 0.68  CALCIUM 9.0   Estimated Creatinine Clearance: 61.9 mL/min (by C-G formula based on SCr of 0.68 mg/dL).   LIVER Recent Labs  Lab 03/06/19 1719  INR 1.0     INFECTIOUS No results for input(s): LATICACIDVEN, PROCALCITON in the last 168 hours.   ENDOCRINE CBG (last 3)  No results for input(s): GLUCAP in the last 72 hours.       IMAGING x48h  - image(s) personally visualized  -   highlighted in bold CT CHEST WO CONTRAST  Result Date: 03/07/2019 CLINICAL DATA:  Pneumothorax. History of sarcoidosis. EXAM: CT CHEST WITHOUT CONTRAST TECHNIQUE: Multidetector CT imaging of the chest was performed following the standard protocol without IV contrast. COMPARISON:  Multiple prior radiographs most recently earlier this day. FINDINGS: Cardiovascular: Mild cardiomegaly. Aortic atherosclerosis. Mitral annulus calcifications. Enlarged main pulmonary artery at 3.8 cm. No pericardial effusion. Mediastinum/Nodes: Multifocal calcified mediastinal, hilar, and retrocrural lymph nodes consistent with sarcoidosis. Detailed assessment of adenopathy is limited in the absence of IV contrast. Esophagus slightly patulous. No esophageal wall thickening. Lungs/Pleura: Right chest  tube in place which courses along the minor fissure, tip in the mid hemithorax. Minimal intraluminal density in the mid and distal chest tube. There is a small right pneumothorax, greatest anteriorly measuring 18 mm. Small partially loculated pneumothorax component also seen laterally tracking in the fissure, and at the apex. Severe underlying chronic lung disease related sarcoidosis with bronchiectasis architectural distortion. Pulmonary cystic changes are greatest at the lung bases. Few scattered calcified granuloma within the lung parenchyma. No evidence of pulmonary edema. No pleural fluid. Upper Abdomen: No acute findings. Musculoskeletal: There are no acute or suspicious osseous abnormalities. Small amount of subcutaneous emphysema in the right chest wall adjacent to chest tube. IMPRESSION: 1. Small partially loculated right pneumothorax, greatest anteriorly. Right chest tube in place. Minimal intraluminal density in the mid and distal chest tube may be debris or hemorrhage. 2. Severe chronic lung disease related to sarcoidosis with bronchiectasis and cystic changes, greatest in the lung bases. 3. Cardiomegaly. Enlarged main pulmonary artery consistent with pulmonary arterial hypertension. 4. Calcified mediastinal, hilar, and retrocrural lymph nodes  consistent with sarcoidosis. Aortic Atherosclerosis (ICD10-I70.0). Electronically Signed   By: Keith Rake M.D.   On: 03/07/2019 20:04   DG CHEST PORT 1 VIEW  Result Date: 03/09/2019 CLINICAL DATA:  History of pneumothorax. History of sarcoidosis. Update status. EXAM: PORTABLE CHEST 1 VIEW COMPARISON:  Chest x-rays dated 03/07/2019 and 03/06/2019. Chest CT dated 03/07/2019. FINDINGS: RIGHT-sided chest tube is stable in position. Persistent small pneumothorax at the RIGHT lung apex. Coarse lung markings again seen throughout both lungs, upper lobe predominant, compatible with the given history of sarcoidosis. Stable cardiomegaly. No pleural effusions seen.  No confluent opacity to suggest superimposed pneumonia. IMPRESSION: 1. Stable chest x-ray. Persistent small pneumothorax at the RIGHT lung apex. 2. Stable cardiomegaly. Electronically Signed   By: Franki Cabot M.D.   On: 03/09/2019 09:58   ECHOCARDIOGRAM COMPLETE  Result Date: 03/08/2019    ECHOCARDIOGRAM REPORT   Patient Name:   Lauren Massey Date of Exam: 03/08/2019 Medical Rec #:  [id number]        Height:       62.0 in Accession #:    [id number]       Weight:       138.7 lb Date of Birth:  03-27-1955        BSA:          1.64 m Patient Age:    31 years         BP:           130/76 mmHg Patient Gender: F                HR:           65 bpm. Exam Location:  Inpatient Procedure: 2D Echo Indications:    dyspnea 786.09  History:        Patient has prior history of Echocardiogram examinations, most                 recent 05/26/2016.  Sonographer:    Johny Chess Referring Phys: [id number] [place]  1. Left ventricular ejection fraction, by estimation, is 60 to 65%. The left ventricle has normal function. The left ventricle has no regional wall motion abnormalities. Left ventricular diastolic parameters were normal.  2. Right ventricular systolic function is normal. The right ventricular size is normal. There is mildly elevated pulmonary artery systolic pressure.  3. The mitral valve is normal in structure and function. Trivial mitral valve regurgitation. No evidence of mitral stenosis.  4. The aortic valve is tricuspid. Aortic valve regurgitation is not visualized. Mild aortic valve sclerosis is present, with no evidence of aortic valve stenosis. FINDINGS  Left Ventricle: Left ventricular ejection fraction, by estimation, is 60 to 65%. The left ventricle has normal function. The left ventricle has no regional wall motion abnormalities. The left ventricular internal cavity size was normal in size. There is  no left ventricular hypertrophy. Left ventricular diastolic parameters were normal.  Right Ventricle: The right ventricular size is normal. No increase in right ventricular wall thickness. Right ventricular systolic function is normal. There is mildly elevated pulmonary artery systolic pressure. The tricuspid regurgitant velocity is 2.61  m/s, and with an assumed right atrial pressure of 3 mmHg, the estimated right ventricular systolic pressure is 123XX123 mmHg. Left Atrium: Left atrial size was normal in size. Right Atrium: Right atrial size was normal in size. Pericardium: There is no evidence of pericardial effusion. Mitral Valve: The mitral valve is normal in structure and function. There is mild  thickening of the mitral valve leaflet(s). There is mild calcification of the mitral valve leaflet(s). Normal mobility of the mitral valve leaflets. Moderate mitral annular  calcification. Trivial mitral valve regurgitation. No evidence of mitral valve stenosis. Tricuspid Valve: The tricuspid valve is normal in structure. Tricuspid valve regurgitation is mild . No evidence of tricuspid stenosis. Aortic Valve: The aortic valve is tricuspid. Aortic valve regurgitation is not visualized. Mild aortic valve sclerosis is present, with no evidence of aortic valve stenosis. Pulmonic Valve: The pulmonic valve was normal in structure. Pulmonic valve regurgitation is not visualized. No evidence of pulmonic stenosis. Aorta: The aortic root is normal in size and structure. IAS/Shunts: No atrial level shunt detected by color flow Doppler.  LEFT VENTRICLE PLAX 2D LVIDd:         3.90 cm  Diastology LVIDs:         2.10 cm  LV e' lateral:   10.90 cm/s LV PW:         1.00 cm  LV E/e' lateral: 10.5 LV IVS:        0.90 cm  LV e' medial:    6.42 cm/s LVOT diam:     1.80 cm  LV E/e' medial:  17.8 LV SV:         77.61 ml LV SV Index:   30.80 LVOT Area:     2.54 cm  RIGHT VENTRICLE RV S prime:     9.90 cm/s TAPSE (M-mode): 2.0 cm LEFT ATRIUM             Index       RIGHT ATRIUM           Index LA diam:        3.70 cm 2.26 cm/m   RA Area:     12.00 cm LA Vol (A2C):   40.7 ml 24.88 ml/m RA Volume:   26.30 ml  16.07 ml/m LA Vol (A4C):   43.5 ml 26.59 ml/m LA Biplane Vol: 46.0 ml 28.11 ml/m  AORTIC VALVE LVOT Vmax:   131.00 cm/s LVOT Vmean:  101.000 cm/s LVOT VTI:    0.305 m  AORTA Ao Root diam: 2.70 cm MITRAL VALVE                TRICUSPID VALVE MV Area (PHT): 2.69 cm     TR Peak grad:   27.2 mmHg MV Decel Time: 282 msec     TR Vmax:        261.00 cm/s MV E velocity: 114.00 cm/s MV A velocity: 119.00 cm/s  SHUNTS MV E/A ratio:  0.96         Systemic VTI:  0.30 m                             Systemic Diam: 1.80 cm Jenkins Rouge MD Electronically signed by Jenkins Rouge MD Signature Date/Time: 03/08/2019/2:53:44 PM    Final

## 2019-03-09 NOTE — Plan of Care (Signed)

## 2019-03-10 ENCOUNTER — Other Ambulatory Visit: Payer: Self-pay | Admitting: Physician Assistant

## 2019-03-10 ENCOUNTER — Inpatient Hospital Stay (HOSPITAL_COMMUNITY): Payer: 59

## 2019-03-10 DIAGNOSIS — D8685 Sarcoid myocarditis: Secondary | ICD-10-CM

## 2019-03-10 DIAGNOSIS — J9311 Primary spontaneous pneumothorax: Secondary | ICD-10-CM

## 2019-03-10 NOTE — Progress Notes (Addendum)
      [address]       [place] [address]             [phone]           Subjective: Patient still with dry nose (because of oxygen).  Objective: Vital signs in last 24 hours: Temp:  [97.8 F (36.6 C)-99.2 F (37.3 C)] 98.2 F (36.8 C) (02/14 0500) Pulse Rate:  [58-69] 69 (02/14 0500) Cardiac Rhythm: Normal sinus rhythm (02/14 0705) Resp:  [14-16] 16 (02/14 0500) BP: (111-148)/(65-99) 148/99 (02/14 0500) SpO2:  [95 %-100 %] 100 % (02/14 0500)     Intake/Output from previous day: No intake/output data recorded.   Physical Exam:  Cardiovascular: RRR Pulmonary: Somewhat diminished bilaterally but clear Chest Tube: to suction, no air leak  Lab Results: CBC: No results for input(s): WBC, HGB, HCT, PLT in the last 72 hours. BMET:  No results for input(s): NA, K, CL, CO2, GLUCOSE, BUN, CREATININE, CALCIUM in the last 72 hours.  PT/INR:  No results for input(s): LABPROT, INR in the last 72 hours. ABG:  INR: Will add last result for INR, ABG once components are confirmed Will add last 4 CBG results once components are confirmed  Assessment/Plan:  1. CV - SR. Cardiology evaluated yesterday. No further cardiac work up recommended prior to surgery. Cardiology to arrange MRI after discharge. 2.  Pulmonary - On 2-3 liters of oxygen via [place]. Chest tube for recurrent pneumothorax. Chest tube with scant output, is to suction, no air leak. CXR stable-trace right apical pneumothorax. Patient likely will proceed with robotic assisted pleurectomy, which is scheduled for Tuesday 02/16. Humidified oxygen to help with dry nasal passages 3. Management pre primary  Sharalyn Ink [facility] 03/10/2019,8:58 AM [id number]  VATS for recurrent spontaneous pneumothorax - Tues todays CXR reviewed patient examined and medical record reviewed,agree with above note. Tharon Aquas Trigt III 03/10/2019

## 2019-03-10 NOTE — Progress Notes (Signed)
NAME:  Lauren Massey, MRN:  [id number], DOB:  1955-03-23, LOS: 4 ADMISSION DATE:  03/06/2019, CONSULTATION DATE:  2/10 REFERRING MD:  Dr. Johnney Killian, CHIEF COMPLAINT:  Pneumothorax   Brief History    64 year old female with PMH as below, which is significant for chronic fibrotic sarcoidosis with multisystem involvement including lungs, skin, and eyes. Diagnosed via bronchoscopy in 2000. She is followed by Dr. Annamaria Boots. She was seen in the pulmonary clinic on 2/5 with no PTX on film.  She presented again to the pulmonary clinic with complaints of dyspnea on 2/10 and was noted to have decreased breath sounds on the R and hypoxia with O2 sats 70% on room air. CXR done in clinic showed large R sided PTX and she was sent to ED. Upon arrival to ED PCCM was called to admission and chest tube placement.   Past Medical History   has a past medical history of Cataract, Sarcoidosis, and Unspecified iridocyclitis.   Significant Hospital Events   Admit 2/10 for recurrent PTX 2/12 - Denies shortness of breath or chest pain.   2/13  - Playing solitaire on her phone.  Lying in bed with head of bed at 45 degrees.  States she is just waiting for surgery in a few days.  Denies any complaints.  Is on nasal cannula oxygen seen by Dr. Pia Mau to test/12/21 and is recommended pleurectomy.  She tells me the current pneumothorax is a recurrence  Consults:  CTS PCCM  Procedures:  Chest tube left 20 french 2/10 >  Significant Diagnostic Tests:  CT Chest 03/08/19 - Right chest tube in place. Small anterior right pneumothorax and small apical pneumothorax. Severe chronic cystic lung disease in the lower lobes  Micro Data:    Antimicrobials:     Interim history/subjective:   03/10/2019 - scheduled for pleurectomy Tuesday 03/12/2019.Marland Kitchen on 2L [place]. Says is bored. No complaints. Cthest tube to suction - tidals + but no air leak  Objective   Blood pressure (!) 148/99, pulse 69, temperature 98.2 F (36.8 C),  temperature source Oral, resp. rate 16, height 5\' 2"  (1.575 m), weight 62.9 kg, SpO2 100 %.        Intake/Output Summary (Last 24 hours) at 03/10/2019 1036 Last data filed at 03/09/2019 1100 Gross per 24 hour  Intake --  Output 0 ml  Net 0 ml   Filed Weights   03/06/19 1526 03/06/19 2146 03/08/19 0500  Weight: 64 kg 62.5 kg 62.9 kg   Physical Exam: Pleasant female Chest tube right side + - tidalks + no air leak Abd soft Throat normal No edema No cyanosis No clubbing AxOx3 Heent - normal Oral cavity - normal  Resolved Hospital Problem list     Assessment & Plan:   Recurrent right pneumothorax in the setting of severe chronic cystic lung disease related to sarcoid  03/10/2019  -no changed  Plan  -Surgery in the next few days for pleurectomy - scheduled 03/12/19 -Continue chest tube to suction -Oxygen for pulse ox greater than 88%. - get labs 03/11/19 ahead of surgery 03/12/19   Sarcoidosis of the lung. With eye and skin involvement - Followed by Dr. Annamaria Boots Restrictive lung disease - FVC 1.62/66%, FEV1 1.46/76%, ratio 0.90, TLC 50%, DLCO 26%  03/10/2019: No active issues  Plan - PRN albuterol - Methotrexate every Wednesday (not ordered this admission) -might not be needed if his burned-out disease - Consider referral for transplant as an outpatient -Consider right heart catheterization as opd  Iridocyclitis - continue pred-forte   Best practice:  Diet: regular diet Pain/Anxiety/Delirium protocol (if indicated):  VAP protocol (if indicated): NA DVT prophylaxis: SQ hep GI prophylaxis: NA Glucose control: NA Mobility: Up with assist Code Status: FULL (documented on discharge 2/1, currently under mod sedation would clarify 2/11.) Family Communication: updated patient DispositionL: 2w12     SIGNATURE    Dr. Brand Males, M.D., F.C.C.P,  Pulmonary and Critical Care Medicine Staff Physician, [place] Director - Interstitial Lung Disease   Program  P[facility] [place], [place], [address]  Pager: [phone], If no answer or between  15:00h - 7:00h: call [phone] Telephone: [phone]  10:36 AM 03/10/2019     LABS    PULMONARY No results for input(s): PHART, PCO2ART, PO2ART, HCO3, TCO2, O2SAT in the last 168 hours.  Invalid input(s): PCO2, PO2  CBC Recent Labs  Lab 03/06/19 1610  HGB 12.1  HCT 37.9  WBC 4.4  PLT 285    COAGULATION Recent Labs  Lab 03/06/19 1719  INR 1.0    CARDIAC  No results for input(s): TROPONINI in the last 168 hours. No results for input(s): PROBNP in the last 168 hours.   CHEMISTRY Recent Labs  Lab 03/06/19 1730  NA 136  K 4.1  CL 100  CO2 28  GLUCOSE 106*  BUN 10  CREATININE 0.68  CALCIUM 9.0   Estimated Creatinine Clearance: 61.9 mL/min (by C-G formula based on SCr of 0.68 mg/dL).   LIVER Recent Labs  Lab 03/06/19 1719  INR 1.0     INFECTIOUS No results for input(s): LATICACIDVEN, PROCALCITON in the last 168 hours.   ENDOCRINE CBG (last 3)  No results for input(s): GLUCAP in the last 72 hours.       IMAGING x48h  - image(s) personally visualized  -   highlighted in bold DG CHEST PORT 1 VIEW  Result Date: 03/10/2019 CLINICAL DATA:  Follow-up pneumothorax. History of pulmonary sarcoid. EXAM: PORTABLE CHEST 1 VIEW COMPARISON:  03/09/2019 FINDINGS: Right-sided chest tube is stable in position. There is been further reduction in tiny right apical pneumothorax. This measures 3 mm in thickness. Previously 8 mm. Mild cardiac enlargement. Chronic reticular and nodular interstitial opacities are again identified and compatible with the clinical history of pulmonary sarcoid. IMPRESSION: Further reduction in tiny right apical pneumothorax. Electronically Signed   By: Kerby Moors M.D.   On: 03/10/2019 09:28   DG CHEST PORT 1 VIEW  Result Date: 03/09/2019 CLINICAL DATA:  History of  pneumothorax. History of sarcoidosis. Update status. EXAM: PORTABLE CHEST 1 VIEW COMPARISON:  Chest x-rays dated 03/07/2019 and 03/06/2019. Chest CT dated 03/07/2019. FINDINGS: RIGHT-sided chest tube is stable in position. Persistent small pneumothorax at the RIGHT lung apex. Coarse lung markings again seen throughout both lungs, upper lobe predominant, compatible with the given history of sarcoidosis. Stable cardiomegaly. No pleural effusions seen. No confluent opacity to suggest superimposed pneumonia. IMPRESSION: 1. Stable chest x-ray. Persistent small pneumothorax at the RIGHT lung apex. 2. Stable cardiomegaly. Electronically Signed   By: Franki Cabot M.D.   On: 03/09/2019 09:58   ECHOCARDIOGRAM COMPLETE  Result Date: 03/08/2019    ECHOCARDIOGRAM REPORT   Patient Name:   Lauren Massey Date of Exam: 03/08/2019 Medical Rec #:  [id number]        Height:       62.0 in Accession #:    [id number]  Weight:       138.7 lb Date of Birth:  07/21/1955        BSA:          1.64 m Patient Age:    79 years         BP:           130/76 mmHg Patient Gender: F                HR:           65 bpm. Exam Location:  Inpatient Procedure: 2D Echo Indications:    dyspnea 786.09  History:        Patient has prior history of Echocardiogram examinations, most                 recent 05/26/2016.  Sonographer:    Johny Chess Referring Phys: [id number] [place]  1. Left ventricular ejection fraction, by estimation, is 60 to 65%. The left ventricle has normal function. The left ventricle has no regional wall motion abnormalities. Left ventricular diastolic parameters were normal.  2. Right ventricular systolic function is normal. The right ventricular size is normal. There is mildly elevated pulmonary artery systolic pressure.  3. The mitral valve is normal in structure and function. Trivial mitral valve regurgitation. No evidence of mitral stenosis.  4. The aortic valve is tricuspid. Aortic valve regurgitation  is not visualized. Mild aortic valve sclerosis is present, with no evidence of aortic valve stenosis. FINDINGS  Left Ventricle: Left ventricular ejection fraction, by estimation, is 60 to 65%. The left ventricle has normal function. The left ventricle has no regional wall motion abnormalities. The left ventricular internal cavity size was normal in size. There is  no left ventricular hypertrophy. Left ventricular diastolic parameters were normal. Right Ventricle: The right ventricular size is normal. No increase in right ventricular wall thickness. Right ventricular systolic function is normal. There is mildly elevated pulmonary artery systolic pressure. The tricuspid regurgitant velocity is 2.61  m/s, and with an assumed right atrial pressure of 3 mmHg, the estimated right ventricular systolic pressure is 123XX123 mmHg. Left Atrium: Left atrial size was normal in size. Right Atrium: Right atrial size was normal in size. Pericardium: There is no evidence of pericardial effusion. Mitral Valve: The mitral valve is normal in structure and function. There is mild thickening of the mitral valve leaflet(s). There is mild calcification of the mitral valve leaflet(s). Normal mobility of the mitral valve leaflets. Moderate mitral annular  calcification. Trivial mitral valve regurgitation. No evidence of mitral valve stenosis. Tricuspid Valve: The tricuspid valve is normal in structure. Tricuspid valve regurgitation is mild . No evidence of tricuspid stenosis. Aortic Valve: The aortic valve is tricuspid. Aortic valve regurgitation is not visualized. Mild aortic valve sclerosis is present, with no evidence of aortic valve stenosis. Pulmonic Valve: The pulmonic valve was normal in structure. Pulmonic valve regurgitation is not visualized. No evidence of pulmonic stenosis. Aorta: The aortic root is normal in size and structure. IAS/Shunts: No atrial level shunt detected by color flow Doppler.  LEFT VENTRICLE PLAX 2D LVIDd:          3.90 cm  Diastology LVIDs:         2.10 cm  LV e' lateral:   10.90 cm/s LV PW:         1.00 cm  LV E/e' lateral: 10.5 LV IVS:        0.90 cm  LV e' medial:  6.42 cm/s LVOT diam:     1.80 cm  LV E/e' medial:  17.8 LV SV:         77.61 ml LV SV Index:   30.80 LVOT Area:     2.54 cm  RIGHT VENTRICLE RV S prime:     9.90 cm/s TAPSE (M-mode): 2.0 cm LEFT ATRIUM             Index       RIGHT ATRIUM           Index LA diam:        3.70 cm 2.26 cm/m  RA Area:     12.00 cm LA Vol (A2C):   40.7 ml 24.88 ml/m RA Volume:   26.30 ml  16.07 ml/m LA Vol (A4C):   43.5 ml 26.59 ml/m LA Biplane Vol: 46.0 ml 28.11 ml/m  AORTIC VALVE LVOT Vmax:   131.00 cm/s LVOT Vmean:  101.000 cm/s LVOT VTI:    0.305 m  AORTA Ao Root diam: 2.70 cm MITRAL VALVE                TRICUSPID VALVE MV Area (PHT): 2.69 cm     TR Peak grad:   27.2 mmHg MV Decel Time: 282 msec     TR Vmax:        261.00 cm/s MV E velocity: 114.00 cm/s MV A velocity: 119.00 cm/s  SHUNTS MV E/A ratio:  0.96         Systemic VTI:  0.30 m                             Systemic Diam: 1.80 cm Jenkins Rouge MD Electronically signed by Jenkins Rouge MD Signature Date/Time: 03/08/2019/2:53:44 PM    Final

## 2019-03-10 NOTE — Progress Notes (Signed)
   Message sent to office to get cardiac MRI scheduled (ordered) and f/u with Dr Gardiner Rhyme.  Rosaria Ferries, PA-C 03/10/2019 11:31 AM

## 2019-03-11 ENCOUNTER — Inpatient Hospital Stay (HOSPITAL_COMMUNITY): Payer: 59

## 2019-03-11 LAB — COMPREHENSIVE METABOLIC PANEL
ALT: 33 U/L (ref 0–44)
AST: 45 U/L — ABNORMAL HIGH (ref 15–41)
Albumin: 3 g/dL — ABNORMAL LOW (ref 3.5–5.0)
Alkaline Phosphatase: 54 U/L (ref 38–126)
Anion gap: 10 (ref 5–15)
BUN: 13 mg/dL (ref 8–23)
CO2: 28 mmol/L (ref 22–32)
Calcium: 9.5 mg/dL (ref 8.9–10.3)
Chloride: 95 mmol/L — ABNORMAL LOW (ref 98–111)
Creatinine, Ser: 0.76 mg/dL (ref 0.44–1.00)
GFR calc Af Amer: >60 mL/min (ref >60)
GFR calc non Af Amer: >60 mL/min (ref >60)
Glucose, Bld: 98 mg/dL (ref 70–99)
Potassium: 4.5 mmol/L (ref 3.5–5.1)
Sodium: 133 mmol/L — ABNORMAL LOW (ref 135–145)
Total Bilirubin: 0.7 mg/dL (ref 0.3–1.2)
Total Protein: 9 g/dL — ABNORMAL HIGH (ref 6.5–8.1)

## 2019-03-11 LAB — CBC WITH DIFFERENTIAL/PLATELET
Abs Immature Granulocytes: 0.01 10*3/uL (ref 0.00–0.07)
Basophils Absolute: 0 10*3/uL (ref 0.0–0.1)
Basophils Relative: 1 %
Eosinophils Absolute: 0.2 10*3/uL (ref 0.0–0.5)
Eosinophils Relative: 6 %
HCT: 44.7 % (ref 36.0–46.0)
Hemoglobin: 13.4 g/dL (ref 12.0–15.0)
Immature Granulocytes: 0 %
Lymphocytes Relative: 36 %
Lymphs Abs: 1.3 10*3/uL (ref 0.7–4.0)
MCH: 27.7 pg (ref 26.0–34.0)
MCHC: 30 g/dL (ref 30.0–36.0)
MCV: 92.5 fL (ref 80.0–100.0)
Monocytes Absolute: 0.4 10*3/uL (ref 0.1–1.0)
Monocytes Relative: 12 %
Neutro Abs: 1.6 10*3/uL — ABNORMAL LOW (ref 1.7–7.7)
Neutrophils Relative %: 45 %
Platelets: 122 10*3/uL — ABNORMAL LOW (ref 150–400)
RBC: 4.83 MIL/uL (ref 3.87–5.11)
RDW: 13.2 % (ref 11.5–15.5)
WBC: 3.6 10*3/uL — ABNORMAL LOW (ref 4.0–10.5)
nRBC: 0 % (ref 0.0–0.2)

## 2019-03-11 LAB — PROTIME-INR
INR: 1 (ref 0.8–1.2)
Prothrombin Time: 13.3 seconds (ref 11.4–15.2)

## 2019-03-11 LAB — CBC
HCT: 42.6 % (ref 36.0–46.0)
Hemoglobin: 13.1 g/dL (ref 12.0–15.0)
MCH: 28.2 pg (ref 26.0–34.0)
MCHC: 30.8 g/dL (ref 30.0–36.0)
MCV: 91.8 fL (ref 80.0–100.0)
Platelets: 203 10*3/uL (ref 150–400)
RBC: 4.64 MIL/uL (ref 3.87–5.11)
RDW: 13.3 % (ref 11.5–15.5)
WBC: 3.1 10*3/uL — ABNORMAL LOW (ref 4.0–10.5)
nRBC: 0 % (ref 0.0–0.2)

## 2019-03-11 LAB — PHOSPHORUS: Phosphorus: 3.6 mg/dL (ref 2.5–4.6)

## 2019-03-11 LAB — TYPE AND SCREEN
ABO/RH(D): A POS
Antibody Screen: NEGATIVE

## 2019-03-11 LAB — APTT: aPTT: 35 seconds (ref 24–36)

## 2019-03-11 LAB — MAGNESIUM: Magnesium: 2 mg/dL (ref 1.7–2.4)

## 2019-03-11 LAB — ABO/RH: ABO/RH(D): A POS

## 2019-03-11 MED ORDER — CEFAZOLIN SODIUM-DEXTROSE 2-4 GM/100ML-% IV SOLN
2.0000 g | INTRAVENOUS | Status: AC
  Administered 2019-03-12: 07:00:00 2 g via INTRAVENOUS
  Filled 2019-03-11 (×2): qty 100

## 2019-03-11 NOTE — Anesthesia Preprocedure Evaluation (Addendum)
Anesthesia Evaluation  Patient identified by MRN, date of birth, ID band Patient awake    Reviewed: Allergy & Precautions, H&P , NPO status , Patient's Chart, lab work & pertinent test results  Airway Mallampati: II  TM Distance: >3 FB Neck ROM: Full    Dental no notable dental hx. (+) Partial Lower, Partial Upper, Dental Advisory Given   Pulmonary neg pulmonary ROS,    Pulmonary exam normal breath sounds clear to auscultation       Cardiovascular Exercise Tolerance: Good negative cardio ROS   Rhythm:Regular Rate:Normal     Neuro/Psych negative neurological ROS  negative psych ROS   GI/Hepatic negative GI ROS, Neg liver ROS,   Endo/Other  negative endocrine ROS  Renal/GU negative Renal ROS  negative genitourinary   Musculoskeletal   Abdominal   Peds  Hematology negative hematology ROS (+)   Anesthesia Other Findings   Reproductive/Obstetrics negative OB ROS                            Anesthesia Physical Anesthesia Plan  ASA: II  Anesthesia Plan: General   Post-op Pain Management:    Induction: Intravenous  PONV Risk Score and Plan: 4 or greater and Ondansetron, Dexamethasone and Midazolam  Airway Management Planned: Double Lumen EBT  Additional Equipment: Arterial line  Intra-op Plan:   Post-operative Plan: Extubation in OR  Informed Consent: I have reviewed the patients History and Physical, chart, labs and discussed the procedure including the risks, benefits and alternatives for the proposed anesthesia with the patient or authorized representative who has indicated his/her understanding and acceptance.     Dental advisory given  Plan Discussed with: CRNA  Anesthesia Plan Comments:         Anesthesia Quick Evaluation

## 2019-03-11 NOTE — Progress Notes (Signed)
Family contacts:   Husband Husband Myrene Buddy) [phone].    Daughter Aleatha Borer [place]) [place], MSN, NP-C [facility] 03/11/2019, 3:00 PM   Please see [email or web address] for pager details.

## 2019-03-11 NOTE — Progress Notes (Signed)
Family contacts updated in patient's chart.

## 2019-03-11 NOTE — Progress Notes (Addendum)
NAME:  Lauren Massey, MRN:  [id number], DOB:  February 19, 1955, LOS: 5 ADMISSION DATE:  03/06/2019, CONSULTATION DATE:  2/10 REFERRING MD:  Dr. Johnney Killian, CHIEF COMPLAINT:  Pneumothorax   Brief History   64 year old female with PMH significant for chronic fibrotic sarcoidosis with multisystem involvement including lungs, skin, and eyes. Diagnosed via bronchoscopy in 2000. She is followed by Dr. Annamaria Boots. She was seen in the pulmonary clinic on 2/5 with no PTX on film.  She presented again to the pulmonary clinic with complaints of dyspnea on 2/10 and was noted to have decreased breath sounds on the R and hypoxia with O2 sats 70% on room air. CXR done in clinic showed large R sided PTX and she was sent to ED. Upon arrival to ED PCCM was called to admission and chest tube placement.   Past Medical History  Chronic Fibrotic Sarcoidosis - lung, skin, eye involvement, followed by Dr. Annamaria Boots.  Significant Hospital Events   2/10 Admit for recurrent PTX 2/15 No air leak from chest tube   Consults:  CTS PCCM  Procedures:  L CT 7F 2/10 >  Significant Diagnostic Tests:  CT Chest 2/12 >> Right chest tube in place. Small anterior right pneumothorax and small apical pneumothorax. Severe chronic cystic lung disease in the lower lobes  Micro Data:  COVID 2/10 >> negative  Influenza A/B 2/10 >> negative   Antimicrobials:     Interim history/subjective:  Pt reports pending surgery in AM. Denies chest pain, SOB, fevers/chills. Occasional cough wth clear productive sputum  Objective   Blood pressure 130/69, pulse (!) 59, temperature 98.4 F (36.9 C), temperature source Oral, resp. rate 20, height 5\' 2"  (1.575 m), weight 62.9 kg, SpO2 99 %.        Intake/Output Summary (Last 24 hours) at 03/11/2019 1439 Last data filed at 03/11/2019 1000 Gross per 24 hour  Intake 300 ml  Output 100 ml  Net 200 ml   Filed Weights   03/06/19 1526 03/06/19 2146 03/08/19 0500  Weight: 64 kg 62.5 kg 62.9 kg    Physical Exam: General: pleasant adult female sitting up in chair in NAD  HEENT: MM pink/moist, no jvd, anicteric  Neuro: AAOx4, speech clear, MAE  CV: s1s2 RRR, no m/r/g PULM: Non-labored, distant breath sounds GI: soft, bsx4 active  Extremities: warm/dry, no edema  Skin: no rashes or lesions  Resolved Hospital Problem list     Assessment & Plan:   Recurrent Right Pneumothorax secondary to Severe Chronic Cystic Lung Disease in setting of Sarcoidosis  Complicated clinical course with recurrent right sided collapse.   -appreciate CVTS assistance with patient care -pending pleurectomy 2/16 am  -chest tube care per protocol  -follow intermittent CXR -wean O2 for sats >90%  Sarcoidosis Restrictive Lung Disease Bronchiectasis  Suspected Pulmonary Hypertension  Lung, eye and skin involvement. On methotrexate Q Wednesday (not ordered this admit). Followed by Dr. Annamaria Boots.  FVC 1.62/66%, FEV1 1.46/76%, ratio 0.90, TLC 50%, DLCO 26%.   -PRN albuterol  -may not need methotrexate at this point as this appears to be burned-out disease  -consider RHC & possible outpatient referral for left lung transplant evaluation  Iridocyclitis -continue pred-forte    Best practice:  Diet: regular diet Pain/Anxiety/Delirium protocol (if indicated): n/a VAP protocol (if indicated): n/a DVT prophylaxis: SQ heparin GI prophylaxis: NA Glucose control: NA Mobility: Up with assist Code Status: Full Code  Family Communication: patient and husband updated on plan of care 2/15. Husband Myrene Buddy) [phone].  Daughter Aleatha Borer [place]) [phone] Disposition: Med-Surg   LABS    PULMONARY No results for input(s): PHART, PCO2ART, PO2ART, HCO3, TCO2, O2SAT in the last 168 hours.  Invalid input(s): PCO2, PO2  CBC Recent Labs  Lab 03/06/19 1610 03/11/19 0300  HGB 12.1 13.4  HCT 37.9 44.7  WBC 4.4 3.6*  PLT 285 122*    COAGULATION Recent Labs  Lab 03/06/19 1719  03/11/19 0300  INR 1.0 1.0    CARDIAC  No results for input(s): TROPONINI in the last 168 hours. No results for input(s): PROBNP in the last 168 hours.   CHEMISTRY Recent Labs  Lab 03/06/19 1730 03/11/19 0300  NA 136 133*  K 4.1 4.5  CL 100 95*  CO2 28 28  GLUCOSE 106* 98  BUN 10 13  CREATININE 0.68 0.76  CALCIUM 9.0 9.5  MG  --  2.0  PHOS  --  3.6   Estimated Creatinine Clearance: 61.9 mL/min (by C-G formula based on SCr of 0.76 mg/dL).   LIVER Recent Labs  Lab 03/06/19 1719 03/11/19 0300  AST  --  45*  ALT  --  33  ALKPHOS  --  54  BILITOT  --  0.7  PROT  --  9.0*  ALBUMIN  --  3.0*  INR 1.0 1.0     INFECTIOUS No results for input(s): LATICACIDVEN, PROCALCITON in the last 168 hours.   ENDOCRINE CBG (last 3)  No results for input(s): GLUCAP in the last 72 hours.   IMAGING  DG CHEST PORT 1 VIEW  Result Date: 03/11/2019 CLINICAL DATA:  64 year old female with history of pneumothorax. Right-sided chest tube. Follow-up study. EXAM: PORTABLE CHEST 1 VIEW COMPARISON:  Chest x-ray 01/07/2020. FINDINGS: Right-sided chest tube remains stable in position with tip and side ports projecting over the right mid hemithorax. Small residual right apical pneumothorax (less than 5% of the volume of the right hemithorax) is again noted. Lung volumes are low. Diffuse reticular opacities are noted throughout the lungs bilaterally, similar to the prior examination. No acute consolidative airspace disease. No pleural effusions. No evidence of pulmonary edema. Heart size is normal. Atherosclerotic calcifications in the thoracic aorta. IMPRESSION: 1. Right-sided chest tube is stable in position with small residual right apical pneumothorax which appears unchanged. 2. Chronic lung changes compatible with reported clinical history of sarcoidosis, similar to prior studies, as above. 3. Aortic atherosclerosis. Electronically Signed   By: Vinnie Langton M.D.   On: 03/11/2019 08:16   DG  CHEST PORT 1 VIEW  Result Date: 03/10/2019 CLINICAL DATA:  Follow-up pneumothorax. History of pulmonary sarcoid. EXAM: PORTABLE CHEST 1 VIEW COMPARISON:  03/09/2019 FINDINGS: Right-sided chest tube is stable in position. There is been further reduction in tiny right apical pneumothorax. This measures 3 mm in thickness. Previously 8 mm. Mild cardiac enlargement. Chronic reticular and nodular interstitial opacities are again identified and compatible with the clinical history of pulmonary sarcoid. IMPRESSION: Further reduction in tiny right apical pneumothorax. Electronically Signed   By: Kerby Moors M.D.   On: 03/10/2019 09:28        Noe Gens, MSN, NP-C [facility] 03/11/2019, 2:54 PM   Please see [email or web address] for pager details.

## 2019-03-11 NOTE — Progress Notes (Addendum)
      Lauren Massey [address]       [place],Lauren Massey [id number]             [phone]           Subjective: Patient without specific complaint this am.  Objective: Vital signs in last 24 hours: Temp:  [97.8 F (36.6 C)-98.4 F (36.9 C)] 98 F (36.7 C) (02/15 0422) Pulse Rate:  [67-70] 68 (02/15 0422) Cardiac Rhythm: Normal sinus rhythm (02/14 2322) Resp:  [16-18] 16 (02/15 0422) BP: (121-130)/(72-79) 122/72 (02/15 0422) SpO2:  [100 %] 100 % (02/15 0422)     Intake/Output from previous day: 02/14 0701 - 02/15 0700 In: -  Out: 30 [Chest Tube:30]   Physical Exam:  Cardiovascular: RRR Pulmonary: Somewhat diminished bilaterally but clear Chest Tube: to suction, no air leak  Lab Results: CBC: Recent Labs    03/11/19 0300  WBC 3.6*  HGB 13.4  HCT 44.7  PLT 122*   BMET:  Recent Labs    03/11/19 0300  NA 133*  K 4.5  CL 95*  CO2 28  GLUCOSE 98  BUN 13  CREATININE 0.76  CALCIUM 9.5    PT/INR:  Recent Labs    03/11/19 0300  LABPROT 13.3  INR 1.0   ABG:  INR: Will add last result for INR, ABG once components are confirmed Will add last 4 CBG results once components are confirmed  Assessment/Plan:  1. CV - SR, SB at times. Cardiology evaluated yesterday. No further cardiac work up recommended prior to surgery. Cardiology to arrange MRI after discharge. 2.  Pulmonary - On 3 liters of oxygen via [place]. Chest tube for recurrent pneumothorax. Chest tube with 30 cc of output last 24 hours, is to suction, no air leak. CXR stable-trace right apical pneumothorax. Patient likely will proceed with robotic assisted pleurectomy, which will be scheduled for am 02/16. Humidified oxygen to help with dry nasal passages 3. Management pre primary  Lauren Massey Lauren Massey 03/11/2019,7:02 AM [id number]  Patient is without specific complaints today.  Minimal chest tube output currently no air leak trace right apical pneumo. Previously with just removal of the chest  tube the patient fairly quickly developed a recurrent right pneumothorax.  With her underlying lung disease I discussed with her more definitive ways of preventing recurrent pneumo.  Possible talc slurry pleurodesis through current chest tube, this would have lower chance of success.  I reviewed with the patient and her husband and daughter proceeding with robotic assisted right pleurectomy-risks and options are discussed in detail.  The patient is aware of the degree of her underlying lung disease secondary to sarcoidosis.  We will plan to proceed tomorrow.  The goals risks and alternatives of the planned surgical procedure Procedure(s): XI ROBOTIC ASSISTED THORASCOPY-PLEURECTOMY (Right)  have been discussed with the patient in detail. The risks of the procedure including death, infection, stroke, myocardial infarction, bleeding, blood transfusion, possible recurrent pneumothorax have all been discussed specifically.  I have quoted Lauren Massey a 2 % of perioperative mortality and a complication rate as high as 30 %. The patient's questions have been answered.Lauren Massey is willing  to proceed with the planned procedure.

## 2019-03-12 ENCOUNTER — Inpatient Hospital Stay (HOSPITAL_COMMUNITY): Payer: 59 | Admitting: Certified Registered Nurse Anesthetist

## 2019-03-12 ENCOUNTER — Inpatient Hospital Stay (HOSPITAL_COMMUNITY): Payer: 59

## 2019-03-12 ENCOUNTER — Encounter (HOSPITAL_COMMUNITY)
Admission: EM | Disposition: A | Discharge status: Home / Self Care | Payer: Self-pay | Source: Home / Self Care | Attending: Internal Medicine

## 2019-03-12 DIAGNOSIS — D860 Sarcoidosis of lung: Secondary | ICD-10-CM

## 2019-03-12 DIAGNOSIS — J939 Pneumothorax, unspecified: Secondary | ICD-10-CM

## 2019-03-12 HISTORY — PX: INTERCOSTAL NERVE BLOCK: SHX5021

## 2019-03-12 HISTORY — PX: VIDEO BRONCHOSCOPY: SHX5072

## 2019-03-12 LAB — CBC WITH DIFFERENTIAL/PLATELET
Abs Immature Granulocytes: 0.01 10*3/uL (ref 0.00–0.07)
Basophils Absolute: 0 10*3/uL (ref 0.0–0.1)
Basophils Relative: 1 %
Eosinophils Absolute: 0.2 10*3/uL (ref 0.0–0.5)
Eosinophils Relative: 8 %
HCT: 40.7 % (ref 36.0–46.0)
Hemoglobin: 12.3 g/dL (ref 12.0–15.0)
Immature Granulocytes: 0 %
Lymphocytes Relative: 32 %
Lymphs Abs: 1 10*3/uL (ref 0.7–4.0)
MCH: 27.7 pg (ref 26.0–34.0)
MCHC: 30.2 g/dL (ref 30.0–36.0)
MCV: 91.7 fL (ref 80.0–100.0)
Monocytes Absolute: 0.4 10*3/uL (ref 0.1–1.0)
Monocytes Relative: 13 %
Neutro Abs: 1.5 10*3/uL — ABNORMAL LOW (ref 1.7–7.7)
Neutrophils Relative %: 46 %
Platelets: 211 10*3/uL (ref 150–400)
RBC: 4.44 MIL/uL (ref 3.87–5.11)
RDW: 13.2 % (ref 11.5–15.5)
WBC: 3.2 10*3/uL — ABNORMAL LOW (ref 4.0–10.5)
nRBC: 0 % (ref 0.0–0.2)

## 2019-03-12 LAB — COMPREHENSIVE METABOLIC PANEL
ALT: 35 U/L (ref 0–44)
AST: 44 U/L — ABNORMAL HIGH (ref 15–41)
Albumin: 2.9 g/dL — ABNORMAL LOW (ref 3.5–5.0)
Alkaline Phosphatase: 60 U/L (ref 38–126)
Anion gap: 8 (ref 5–15)
BUN: 11 mg/dL (ref 8–23)
CO2: 33 mmol/L — ABNORMAL HIGH (ref 22–32)
Calcium: 9.6 mg/dL (ref 8.9–10.3)
Chloride: 95 mmol/L — ABNORMAL LOW (ref 98–111)
Creatinine, Ser: 0.71 mg/dL (ref 0.44–1.00)
GFR calc Af Amer: >60 mL/min (ref >60)
GFR calc non Af Amer: >60 mL/min (ref >60)
Glucose, Bld: 115 mg/dL — ABNORMAL HIGH (ref 70–99)
Potassium: 3.9 mmol/L (ref 3.5–5.1)
Sodium: 136 mmol/L (ref 135–145)
Total Bilirubin: 0.6 mg/dL (ref 0.3–1.2)
Total Protein: 8.8 g/dL — ABNORMAL HIGH (ref 6.5–8.1)

## 2019-03-12 LAB — PHOSPHORUS: Phosphorus: 3.8 mg/dL (ref 2.5–4.6)

## 2019-03-12 LAB — MAGNESIUM: Magnesium: 2 mg/dL (ref 1.7–2.4)

## 2019-03-12 SURGERY — LOBECTOMY, LUNG, ROBOT-ASSISTED, USING VATS
Anesthesia: General | Site: Chest | Laterality: Right

## 2019-03-12 MED ORDER — LACTATED RINGERS IV SOLN: INTRAVENOUS | Status: DC | PRN

## 2019-03-12 MED ORDER — KCL IN DEXTROSE-NACL 20-5-0.2 MEQ/L-%-% IV SOLN
INTRAVENOUS | Status: DC
  Filled 2019-03-12 (×3): qty 1000

## 2019-03-12 MED ORDER — ONDANSETRON HCL 4 MG/2ML IJ SOLN: 4.0000 mg | Freq: Four times a day (QID) | INTRAMUSCULAR | Status: DC | PRN

## 2019-03-12 MED ORDER — FENTANYL CITRATE (PF) 100 MCG/2ML IJ SOLN
INTRAMUSCULAR | Status: DC | PRN
  Administered 2019-03-12: 100 ug via INTRAVENOUS
  Administered 2019-03-12 (×3): 50 ug via INTRAVENOUS

## 2019-03-12 MED ORDER — SODIUM CHLORIDE 0.9 % IV SOLN
INTRAVENOUS | Status: DC | PRN
  Administered 2019-03-12: 250 mL via INTRAVENOUS

## 2019-03-12 MED ORDER — SENNOSIDES-DOCUSATE SODIUM 8.6-50 MG PO TABS
1.0000 | ORAL_TABLET | Freq: Every day | ORAL | Status: DC
  Administered 2019-03-13 – 2019-03-14 (×2): 1 via ORAL
  Filled 2019-03-12 (×4): qty 1

## 2019-03-12 MED ORDER — MIDAZOLAM HCL 2 MG/2ML IJ SOLN
INTRAMUSCULAR | Status: AC
  Filled 2019-03-12: qty 2

## 2019-03-12 MED ORDER — CEFAZOLIN SODIUM-DEXTROSE 2-4 GM/100ML-% IV SOLN
2.0000 g | Freq: Three times a day (TID) | INTRAVENOUS | Status: AC
  Administered 2019-03-12 (×2): 2 g via INTRAVENOUS
  Filled 2019-03-12 (×3): qty 100

## 2019-03-12 MED ORDER — ESMOLOL HCL 100 MG/10ML IV SOLN
INTRAVENOUS | Status: DC | PRN
  Administered 2019-03-12 (×2): 10 mg via INTRAVENOUS

## 2019-03-12 MED ORDER — BISACODYL 5 MG PO TBEC
10.0000 mg | DELAYED_RELEASE_TABLET | Freq: Every day | ORAL | Status: DC
  Administered 2019-03-12 – 2019-03-14 (×3): 10 mg via ORAL
  Filled 2019-03-12 (×4): qty 2

## 2019-03-12 MED ORDER — HYDROMORPHONE HCL 1 MG/ML IJ SOLN
INTRAMUSCULAR | Status: AC
  Filled 2019-03-12: qty 1

## 2019-03-12 MED ORDER — SODIUM CHLORIDE 0.9 % IR SOLN
Status: DC | PRN
  Administered 2019-03-12: 1

## 2019-03-12 MED ORDER — OXYCODONE HCL 5 MG PO TABS
5.0000 mg | ORAL_TABLET | ORAL | Status: DC | PRN
  Administered 2019-03-13: 10 mg via ORAL
  Administered 2019-03-13: 5 mg via ORAL
  Administered 2019-03-14 – 2019-03-15 (×4): 10 mg via ORAL
  Filled 2019-03-12: qty 1
  Filled 2019-03-12 (×5): qty 2

## 2019-03-12 MED ORDER — BUPIVACAINE LIPOSOME 1.3 % IJ SUSP
INTRAMUSCULAR | Status: DC | PRN
  Administered 2019-03-12: 100 mL

## 2019-03-12 MED ORDER — FENTANYL CITRATE (PF) 250 MCG/5ML IJ SOLN
INTRAMUSCULAR | Status: AC
  Filled 2019-03-12: qty 5

## 2019-03-12 MED ORDER — TRAMADOL HCL 50 MG PO TABS: 50.0000 mg | ORAL_TABLET | Freq: Four times a day (QID) | ORAL | Status: DC | PRN

## 2019-03-12 MED ORDER — LIDOCAINE 2% (20 MG/ML) 5 ML SYRINGE
INTRAMUSCULAR | Status: DC | PRN
  Administered 2019-03-12: 60 mg via INTRAVENOUS

## 2019-03-12 MED ORDER — HEMOSTATIC AGENTS (NO CHARGE) OPTIME
TOPICAL | Status: DC | PRN
  Administered 2019-03-12: 3 via TOPICAL

## 2019-03-12 MED ORDER — PROPOFOL 10 MG/ML IV BOLUS
INTRAVENOUS | Status: DC | PRN
  Administered 2019-03-12: 50 mg via INTRAVENOUS
  Administered 2019-03-12: 100 mg via INTRAVENOUS
  Administered 2019-03-12 (×2): 50 mg via INTRAVENOUS

## 2019-03-12 MED ORDER — ACETAMINOPHEN 500 MG PO TABS
1000.0000 mg | ORAL_TABLET | Freq: Four times a day (QID) | ORAL | Status: DC
  Administered 2019-03-12 – 2019-03-15 (×13): 1000 mg via ORAL
  Filled 2019-03-12 (×13): qty 2

## 2019-03-12 MED ORDER — SODIUM CHLORIDE 0.9 % IV SOLN: INTRAVENOUS | Status: DC | PRN

## 2019-03-12 MED ORDER — 0.9 % SODIUM CHLORIDE (POUR BTL) OPTIME
TOPICAL | Status: DC | PRN
  Administered 2019-03-12: 2000 mL

## 2019-03-12 MED ORDER — SUGAMMADEX SODIUM 200 MG/2ML IV SOLN
INTRAVENOUS | Status: DC | PRN
  Administered 2019-03-12: 250 mg via INTRAVENOUS

## 2019-03-12 MED ORDER — HYDROMORPHONE HCL 1 MG/ML IJ SOLN
0.2500 mg | INTRAMUSCULAR | Status: DC | PRN
  Administered 2019-03-12 (×2): 0.5 mg via INTRAVENOUS

## 2019-03-12 MED ORDER — ONDANSETRON HCL 4 MG/2ML IJ SOLN
INTRAMUSCULAR | Status: DC | PRN
  Administered 2019-03-12: 4 mg via INTRAVENOUS

## 2019-03-12 MED ORDER — MIDAZOLAM HCL 2 MG/2ML IJ SOLN
INTRAMUSCULAR | Status: DC | PRN
  Administered 2019-03-12: 2 mg via INTRAVENOUS

## 2019-03-12 MED ORDER — BUPIVACAINE LIPOSOME 1.3 % IJ SUSP
20.0000 mL | INTRAMUSCULAR | Status: DC
  Filled 2019-03-12: qty 20

## 2019-03-12 MED ORDER — PHENYLEPHRINE HCL-NACL 10-0.9 MG/250ML-% IV SOLN
INTRAVENOUS | Status: DC | PRN
  Administered 2019-03-12: 30 ug/min via INTRAVENOUS

## 2019-03-12 MED ORDER — ACETAMINOPHEN 160 MG/5ML PO SOLN: 1000.0000 mg | Freq: Four times a day (QID) | ORAL | Status: DC

## 2019-03-12 MED ORDER — BUPIVACAINE HCL (PF) 0.5 % IJ SOLN
INTRAMUSCULAR | Status: AC
  Filled 2019-03-12: qty 30

## 2019-03-12 MED ORDER — PROPOFOL 10 MG/ML IV BOLUS
INTRAVENOUS | Status: AC
  Filled 2019-03-12: qty 40

## 2019-03-12 MED ORDER — ACETAMINOPHEN 500 MG PO TABS: 1000.0000 mg | ORAL_TABLET | Freq: Once | ORAL | Status: DC

## 2019-03-12 MED ORDER — PHENYLEPHRINE 40 MCG/ML (10ML) SYRINGE FOR IV PUSH (FOR BLOOD PRESSURE SUPPORT)
PREFILLED_SYRINGE | INTRAVENOUS | Status: DC | PRN
  Administered 2019-03-12: 40 ug via INTRAVENOUS
  Administered 2019-03-12: 120 ug via INTRAVENOUS
  Administered 2019-03-12: 200 ug via INTRAVENOUS

## 2019-03-12 MED ORDER — ROCURONIUM BROMIDE 10 MG/ML (PF) SYRINGE
PREFILLED_SYRINGE | INTRAVENOUS | Status: DC | PRN
  Administered 2019-03-12: 40 mg via INTRAVENOUS
  Administered 2019-03-12: 60 mg via INTRAVENOUS
  Administered 2019-03-12: 20 mg via INTRAVENOUS

## 2019-03-12 MED ORDER — CHLORHEXIDINE GLUCONATE CLOTH 2 % EX PADS: 6.0000 | MEDICATED_PAD | Freq: Every day | CUTANEOUS | Status: DC

## 2019-03-12 MED ORDER — DEXAMETHASONE SODIUM PHOSPHATE 10 MG/ML IJ SOLN
INTRAMUSCULAR | Status: DC | PRN
  Administered 2019-03-12: 10 mg via INTRAVENOUS

## 2019-03-12 SURGICAL SUPPLY — 101 items
ADH SKN CLS APL DERMABOND .7 (GAUZE/BANDAGES/DRESSINGS) ×2
APL SRG 22X2 LUM MLBL SLNT (VASCULAR PRODUCTS)
APPLICATOR TIP EXT COSEAL (VASCULAR PRODUCTS) IMPLANT
BAG SPEC RTRVL 10 TROC 200 (ENDOMECHANICALS) ×2
BLADE CLIPPER SURG (BLADE) IMPLANT
BNDG COHESIVE 6X5 TAN STRL LF (GAUZE/BANDAGES/DRESSINGS) ×3 IMPLANT
CANISTER SUCT 3000ML PPV (MISCELLANEOUS) ×6 IMPLANT
CANNULA REDUC XI 12-8 STAPL (CANNULA) ×2
CANNULA REDUCER 12-8 DVNC XI (CANNULA) ×4 IMPLANT
CATH THORACIC 28FR (CATHETERS) ×3 IMPLANT
CATH THORACIC 36FR (CATHETERS) IMPLANT
CATH THORACIC 36FR RT ANG (CATHETERS) IMPLANT
CLIP VESOCCLUDE MED 6/CT (CLIP) IMPLANT
CNTNR URN SCR LID CUP LEK RST (MISCELLANEOUS) ×6 IMPLANT
CONN ST 1/4X3/8  BEN (MISCELLANEOUS)
CONN ST 1/4X3/8 BEN (MISCELLANEOUS) IMPLANT
CONN Y 3/8X3/8X3/8  BEN (MISCELLANEOUS)
CONN Y 3/8X3/8X3/8 BEN (MISCELLANEOUS) IMPLANT
CONT SPEC 4OZ STRL OR WHT (MISCELLANEOUS) ×6
COVER SURGICAL LIGHT HANDLE (MISCELLANEOUS) ×3 IMPLANT
DEFOGGER SCOPE WARMER CLEARIFY (MISCELLANEOUS) ×3 IMPLANT
DERMABOND ADVANCED (GAUZE/BANDAGES/DRESSINGS) ×1
DERMABOND ADVANCED .7 DNX12 (GAUZE/BANDAGES/DRESSINGS) ×2 IMPLANT
DISSECTOR BLUNT TIP ENDO 5MM (MISCELLANEOUS) IMPLANT
DRAIN CHANNEL 28F RND 3/8 FF (WOUND CARE) IMPLANT
DRAPE ARM DVNC X/XI (DISPOSABLE) ×8 IMPLANT
DRAPE COLUMN DVNC XI (DISPOSABLE) ×2 IMPLANT
DRAPE CV SPLIT W-CLR ANES SCRN (DRAPES) ×3 IMPLANT
DRAPE DA VINCI XI ARM (DISPOSABLE) ×4
DRAPE DA VINCI XI COLUMN (DISPOSABLE) ×1
DRAPE WARM FLUID 44X44 (DRAPES) IMPLANT
ELECT BLADE 4.0 EZ CLEAN MEGAD (MISCELLANEOUS)
ELECT REM PT RETURN 9FT ADLT (ELECTROSURGICAL) ×3
ELECTRODE BLDE 4.0 EZ CLN MEGD (MISCELLANEOUS) IMPLANT
ELECTRODE REM PT RTRN 9FT ADLT (ELECTROSURGICAL) ×2 IMPLANT
FILTER SMOKE EVAC ULPA (FILTER) IMPLANT
GAUZE KITTNER 4X10 (MISCELLANEOUS) ×6 IMPLANT
GAUZE SPONGE 4X4 12PLY STRL (GAUZE/BANDAGES/DRESSINGS) ×3 IMPLANT
GAUZE SPONGE 4X4 12PLY STRL LF (GAUZE/BANDAGES/DRESSINGS) ×3 IMPLANT
GLOVE BIO SURGEON STRL SZ 6.5 (GLOVE) ×6 IMPLANT
GLOVE BIOGEL PI IND STRL 6.5 (GLOVE) ×2 IMPLANT
GLOVE BIOGEL PI INDICATOR 6.5 (GLOVE) ×1
GLOVE SKINSENSE STRL SZ6.0 (GLOVE) ×3 IMPLANT
GOWN STRL NON-REIN LRG LVL3 (GOWN DISPOSABLE) ×3 IMPLANT
GOWN STRL REUS W/ TWL LRG LVL3 (GOWN DISPOSABLE) ×6 IMPLANT
GOWN STRL REUS W/TWL LRG LVL3 (GOWN DISPOSABLE) ×6
HEMOSTAT SURGICEL 2X14 (HEMOSTASIS) ×9 IMPLANT
IRRIGATOR SUCT 8 DISP DVNC XI (IRRIGATION / IRRIGATOR) IMPLANT
IRRIGATOR SUCTION 8MM XI DISP (IRRIGATION / IRRIGATOR)
KIT BASIN OR (CUSTOM PROCEDURE TRAY) ×3 IMPLANT
KIT TURNOVER KIT B (KITS) ×3 IMPLANT
NS IRRIG 1000ML POUR BTL (IV SOLUTION) ×6 IMPLANT
PACK CHEST (CUSTOM PROCEDURE TRAY) ×3 IMPLANT
PAD ARMBOARD 7.5X6 YLW CONV (MISCELLANEOUS) ×15 IMPLANT
PASSER SUT SWANSON 36MM LOOP (INSTRUMENTS) IMPLANT
PENCIL SMOKE EVACUATOR (MISCELLANEOUS) IMPLANT
POUCH RETRIEVAL ECOSAC 10 (ENDOMECHANICALS) ×2 IMPLANT
POUCH RETRIEVAL ECOSAC 10MM (ENDOMECHANICALS) ×1
SEAL CANN UNIV 5-8 DVNC XI (MISCELLANEOUS) ×4 IMPLANT
SEAL XI 5MM-8MM UNIVERSAL (MISCELLANEOUS) ×2
SEALANT PROGEL (MISCELLANEOUS) IMPLANT
SEALANT SURG COSEAL 4ML (VASCULAR PRODUCTS) IMPLANT
SEALANT SURG COSEAL 8ML (VASCULAR PRODUCTS) IMPLANT
SET TUBE SMOKE EVAC HIGH FLOW (TUBING) ×3 IMPLANT
SHEET MEDIUM DRAPE 40X70 STRL (DRAPES) ×3 IMPLANT
SLEEVE SUCTION 125 (MISCELLANEOUS) IMPLANT
SOL ANTI FOG 6CC (MISCELLANEOUS) IMPLANT
SOLUTION ANTI FOG 6CC (MISCELLANEOUS)
SOLUTION ELECTROLUBE (MISCELLANEOUS) ×3 IMPLANT
STAPLER CANNULA SEAL DVNC XI (STAPLE) ×4 IMPLANT
STAPLER CANNULA SEAL XI (STAPLE) ×2
STOPCOCK 4 WAY LG BORE MALE ST (IV SETS) ×3 IMPLANT
SUT PROLENE 3 0 SH DA (SUTURE) IMPLANT
SUT PROLENE 4 0 RB 1 (SUTURE)
SUT PROLENE 4-0 RB1 .5 CRCL 36 (SUTURE) IMPLANT
SUT SILK  1 MH (SUTURE) ×2
SUT SILK 1 MH (SUTURE) ×4 IMPLANT
SUT SILK 1 TIES 10X30 (SUTURE) IMPLANT
SUT SILK 2 0SH CR/8 30 (SUTURE) IMPLANT
SUT VIC AB 1 CTX 18 (SUTURE) IMPLANT
SUT VIC AB 1 CTX 36 (SUTURE)
SUT VIC AB 1 CTX36XBRD ANBCTR (SUTURE) IMPLANT
SUT VIC AB 2-0 CTX 36 (SUTURE) ×3 IMPLANT
SUT VIC AB 3-0 X1 27 (SUTURE) ×6 IMPLANT
SUT VICRYL 0 TIES 12 18 (SUTURE) ×3 IMPLANT
SUT VICRYL 0 UR6 27IN ABS (SUTURE) ×9 IMPLANT
SUT VICRYL 2 TP 1 (SUTURE) IMPLANT
SYR 20ML LL LF (SYRINGE) ×3 IMPLANT
SYR 50ML LL SCALE MARK (SYRINGE) ×6 IMPLANT
SYR 5ML LL (SYRINGE) ×3 IMPLANT
SYSTEM SAHARA CHEST DRAIN ATS (WOUND CARE) ×3 IMPLANT
TAPE CLOTH 4X10 WHT NS (GAUZE/BANDAGES/DRESSINGS) ×3 IMPLANT
TAPE UMBILICAL COTTON 1/8X30 (MISCELLANEOUS) IMPLANT
TIP APPLICATOR SPRAY EXTEND 16 (VASCULAR PRODUCTS) IMPLANT
TOWEL GREEN STERILE (TOWEL DISPOSABLE) ×6 IMPLANT
TRAP FLUID SMOKE EVACUATOR (MISCELLANEOUS) IMPLANT
TRAY FOLEY MTR SLVR 16FR STAT (SET/KITS/TRAYS/PACK) ×3 IMPLANT
TROCAR XCEL 12X100 BLDLESS (ENDOMECHANICALS) ×3 IMPLANT
TROCAR XCEL BLADELESS 5X75MML (TROCAR) ×3 IMPLANT
TUBING EXTENTION W/L.L. (IV SETS) ×3 IMPLANT
WATER STERILE IRR 1000ML POUR (IV SOLUTION) ×3 IMPLANT

## 2019-03-12 NOTE — Progress Notes (Signed)
      [address]       [place] [address]             [phone]    Pre Procedure note for inpatients:   Lauren Massey has been scheduled for Procedure(s): XI ROBOTIC ASSISTED THORASCOPY-PLEURECTOMY (Right) today. The various methods of treatment have been discussed with the patient. After consideration of the risks, benefits and treatment options the patient has consented to the planned procedure.   The patient has been seen and labs reviewed. There are no changes in the patient's condition to prevent proceeding with the planned procedure today.  Recent labs:  Lab Results  Component Value Date   WBC 3.2 (L) 03/12/2019   HGB 12.3 03/12/2019   HCT 40.7 03/12/2019   PLT 211 03/12/2019   GLUCOSE 115 (H) 03/12/2019   CHOL 233 (A) 11/17/2018   TRIG 106 11/17/2018   HDL 77 (A) 11/17/2018   LDLCALC 18 11/17/2018   ALT 35 03/12/2019   AST 44 (H) 03/12/2019   NA 136 03/12/2019   K 3.9 03/12/2019   CL 95 (L) 03/12/2019   CREATININE 0.71 03/12/2019   BUN 11 03/12/2019   CO2 33 (H) 03/12/2019   TSH 1.87 08/03/2015   INR 1.0 03/11/2019    Grace Isaac, MD 03/12/2019 6:36 AM

## 2019-03-12 NOTE — Progress Notes (Signed)
NAME:  Lauren Massey, MRN:  [id number], DOB:  [birth year], LOS: 6 ADMISSION DATE:  03/06/2019, CONSULTATION DATE:  2/10 REFERRING MD:  Dr. Johnney Killian, CHIEF COMPLAINT:  Pneumothorax   Brief History   64 year old female with PMH significant for chronic fibrotic sarcoidosis with multisystem involvement including lungs, skin, and eyes. Diagnosed via bronchoscopy in 2000. She is followed by Dr. Annamaria Boots. She was seen in the pulmonary clinic on 2/5 with no PTX on film.  She presented again to the pulmonary clinic with complaints of dyspnea on 2/10 and was noted to have decreased breath sounds on the R and hypoxia with O2 sats 70% on room air. CXR done in clinic showed large R sided PTX and she was sent to ED. Upon arrival to ED PCCM was called to admission and chest tube placement.   Past Medical History  Chronic Fibrotic Sarcoidosis - lung, skin, eye involvement, followed by Dr. Annamaria Boots.  Significant Hospital Events   2/10 Admit for recurrent PTX 2/15 No air leak from chest tube   Consults:  CTS PCCM  Procedures:  L CT 64F 2/10 >  Significant Diagnostic Tests:  CT Chest 2/12 >> Right chest tube in place. Small anterior right pneumothorax and small apical pneumothorax. Severe chronic cystic lung disease in the lower lobes  Micro Data:  COVID 2/10 >> negative  Influenza A/B 2/10 >> negative   Antimicrobials:     Interim history/subjective:   doing well today post-op. No complaints. Pain controlled   Objective   Blood pressure 138/79, pulse 62, temperature (!) 97.2 F (36.2 C), resp. rate 20, height 5\' 2"  (1.575 m), weight 62.9 kg, SpO2 99 %.        Intake/Output Summary (Last 24 hours) at 03/12/2019 1711 Last data filed at 03/12/2019 1353 Gross per 24 hour  Intake 770 ml  Output 330 ml  Net 440 ml   Filed Weights   03/06/19 1526 03/06/19 2146 03/08/19 0500  Weight: 64 kg 62.5 kg 62.9 kg   Physical Exam: General: adult fm, resting in bed, NAD  HEENT: MMM, NCAT, tracking   Neuro: AAOx3, moves all 4  CV: s1 s2 RRR PULM: CTAB, [id number] Extremities: no edema  Skin: no rash   Resolved Hospital Problem list     Assessment & Plan:   Recurrent Right Pneumothorax secondary to Severe Chronic Cystic Lung Disease in setting of Sarcoidosis  Complicated clinical course with recurrent right sided collapse.   - s/p vats  - ct management per tcts - we appreciate their expertise and assistance   Sarcoidosis Restrictive Lung Disease Bronchiectasis  Suspected Pulmonary Hypertension  Lung, eye and skin involvement. On methotrexate Q Wednesday (not ordered this admit). Followed by Dr. Annamaria Boots.  FVC 1.62/66%, FEV1 1.46/76%, ratio 0.90, TLC 50%, DLCO 26%.   - follow up in pulm clinic  - can consider establish care in ILD clinic upon discharge   Iridocyclitis - continue pred forte    Best practice:  Diet: regular diet Pain/Anxiety/Delirium protocol (if indicated): n/a VAP protocol (if indicated): n/a DVT prophylaxis: SQ heparin GI prophylaxis: NA Glucose control: NA Mobility: Up with assist Code Status: Full Code  Family Communication: patient and husband updated on plan of care 2/15. Husband Myrene Buddy) [phone].  Daughter Aleatha Borer [place]) [phone]  I spoke with daughter at bedside   Disposition: Med-Surg   LABS    PULMONARY No results for input(s): PHART, PCO2ART, PO2ART, HCO3, TCO2, O2SAT in the last 168 hours.  Invalid input(s): PCO2,  PO2  CBC Recent Labs  Lab 03/11/19 0300 03/11/19 1641 03/12/19 0154  HGB 13.4 13.1 12.3  HCT 44.7 42.6 40.7  WBC 3.6* 3.1* 3.2*  PLT 122* 203 211    COAGULATION Recent Labs  Lab 03/06/19 1719 03/11/19 0300  INR 1.0 1.0    CARDIAC  No results for input(s): TROPONINI in the last 168 hours. No results for input(s): PROBNP in the last 168 hours.   CHEMISTRY Recent Labs  Lab 03/06/19 1730 03/06/19 1730 03/11/19 0300 03/12/19 0154  NA 136  --  133* 136  K 4.1   < > 4.5 3.9   CL 100  --  95* 95*  CO2 28  --  28 33*  GLUCOSE 106*  --  98 115*  BUN 10  --  13 11  CREATININE 0.68  --  0.76 0.71  CALCIUM 9.0  --  9.5 9.6  MG  --   --  2.0 2.0  PHOS  --   --  3.6 3.8   < > = values in this interval not displayed.   Estimated Creatinine Clearance: 61.9 mL/min (by C-G formula based on SCr of 0.71 mg/dL).   LIVER Recent Labs  Lab 03/06/19 1719 03/11/19 0300 03/12/19 0154  AST  --  45* 44*  ALT  --  33 35  ALKPHOS  --  54 60  BILITOT  --  0.7 0.6  PROT  --  9.0* 8.8*  ALBUMIN  --  3.0* 2.9*  INR 1.0 1.0  --      INFECTIOUS No results for input(s): LATICACIDVEN, PROCALCITON in the last 168 hours.   ENDOCRINE CBG (last 3)  No results for input(s): GLUCAP in the last 72 hours.   IMAGING  DG Chest Port 1 View  Result Date: 03/12/2019 CLINICAL DATA:  Postop EXAM: PORTABLE CHEST 1 VIEW COMPARISON:  03/11/2019 FINDINGS: Right apical chest tube. Prior pneumothorax is no longer visualized. Multifocal patchy/interstitial opacities in this patient with known sarcoidosis. No pleural effusion or pneumothorax. The heart is normal in size. IMPRESSION: Right apical chest tube. Prior pneumothorax is no longer visualized. Electronically Signed   By: Julian Hy M.D.   On: 03/12/2019 11:23   DG CHEST PORT 1 VIEW  Result Date: 03/11/2019 CLINICAL DATA:  64 year old female with history of pneumothorax. Right-sided chest tube. Follow-up study. EXAM: PORTABLE CHEST 1 VIEW COMPARISON:  Chest x-ray 01/07/2020. FINDINGS: Right-sided chest tube remains stable in position with tip and side ports projecting over the right mid hemithorax. Small residual right apical pneumothorax (less than 5% of the volume of the right hemithorax) is again noted. Lung volumes are low. Diffuse reticular opacities are noted throughout the lungs bilaterally, similar to the prior examination. No acute consolidative airspace disease. No pleural effusions. No evidence of pulmonary edema. Heart  size is normal. Atherosclerotic calcifications in the thoracic aorta. IMPRESSION: 1. Right-sided chest tube is stable in position with small residual right apical pneumothorax which appears unchanged. 2. Chronic lung changes compatible with reported clinical history of sarcoidosis, similar to prior studies, as above. 3. Aortic atherosclerosis. Electronically Signed   By: Vinnie Langton M.D.   On: 03/11/2019 08:16          Garner Nash, DO [facility] 03/12/2019 5:11 PM

## 2019-03-12 NOTE — Transfer of Care (Signed)
Immediate Anesthesia Transfer of Care Note  Patient: Kynzleigh Chales Abrahams  Procedure(s) Performed: RIGHT XI ROBOTIC ASSISTED THORASCOPY-PLEURECTOMY and PLEURADESIS (Right Chest) Video Bronchoscopy (N/A )  Patient Location: PACU  Anesthesia Type:General  Level of Consciousness: drowsy and patient cooperative  Airway & Oxygen Therapy: Patient Spontanous Breathing and Patient connected to nasal cannula oxygen  Post-op Assessment: Report given to RN, Post -op Vital signs reviewed and stable and Patient moving all extremities  Post vital signs: Reviewed and stable  Last Vitals:  Vitals Value Taken Time  BP 163/101 03/12/19 1052  Temp    Pulse    Resp 22 03/12/19 1102  SpO2    Vitals shown include unvalidated device data.  Last Pain:  Vitals:   03/12/19 0306  TempSrc: Oral  PainSc:       Patients Stated Pain Goal: 4 ([id number])  Complications: No apparent anesthesia complications

## 2019-03-12 NOTE — Progress Notes (Signed)
Patient ID: Lauren Massey, female   DOB: 1955-10-21, 64 y.o.   MRN: [id number] EVENING ROUNDS NOTE :     [address]       [place] [address]             [phone]                 Day of Surgery Procedure(s) (LRB): RIGHT XI ROBOTIC ASSISTED THORASCOPY-PLEURECTOMY and PLEURADESIS (Right) Video Bronchoscopy (N/A) Intercostal Nerve Block (Right)  Total Length of Stay:  LOS: 6 days  BP 138/79   Pulse 66   Temp (!) 97.2 F (36.2 C)   Resp 18   Ht 5\' 2"  (1.575 m)   Wt 62.9 kg   SpO2 95%   BMI 25.36 kg/m   .Intake/Output      02/15 0701 - 02/16 0700 02/16 0701 - 02/17 0700   P.O. 300 120   I.V. (mL/kg)  650 (10.3)   Total Intake(mL/kg) 300 (4.8) 770 (12.2)   Urine (mL/kg/hr)  240 (0.3)   Blood  20   Chest Tube 140 80   Total Output 140 340   Net +160 +430          . sodium chloride    .  ceFAZolin (ANCEF) IV 2 g (03/12/19 1628)  . dextrose 5 % and 0.2 % NaCl with KCl 20 mEq 50 mL/hr at 03/12/19 1605     Lab Results  Component Value Date   WBC 3.2 (L) 03/12/2019   HGB 12.3 03/12/2019   HCT 40.7 03/12/2019   PLT 211 03/12/2019   GLUCOSE 115 (H) 03/12/2019   CHOL 233 (A) 11/17/2018   TRIG 106 11/17/2018   HDL 77 (A) 11/17/2018   LDLCALC 18 11/17/2018   ALT 35 03/12/2019   AST 44 (H) 03/12/2019   NA 136 03/12/2019   K 3.9 03/12/2019   CL 95 (L) 03/12/2019   CREATININE 0.71 03/12/2019   BUN 11 03/12/2019   CO2 33 (H) 03/12/2019   TSH 1.87 08/03/2015   INR 1.0 03/11/2019   Stable on 2c Chest tube no air leak   Grace Isaac MD  Beeper [phone] Office [phone] 03/12/2019 6:58 PM

## 2019-03-12 NOTE — Anesthesia Procedure Notes (Signed)
Arterial Line Insertion Start/End2/16/2021 6:55 AM, 03/12/2019 7:05 AM Performed by: Leonor Liv, CRNA, CRNA  Patient location: Pre-op. Preanesthetic checklist: patient identified, IV checked, site marked, risks and benefits discussed, surgical consent, monitors and equipment checked, pre-op evaluation, timeout performed and anesthesia consent Lidocaine 1% used for infiltration and patient sedated Right, radial was placed Catheter size: 20 G Hand hygiene performed  and maximum sterile barriers used  Allen's test indicative of satisfactory collateral circulation Attempts: 1 Procedure performed without using ultrasound guided technique. Following insertion, Biopatch and dressing applied. Post procedure assessment: normal  Patient tolerated the procedure well with no immediate complications.

## 2019-03-12 NOTE — Anesthesia Procedure Notes (Addendum)
Procedure Name: Intubation Date/Time: 03/12/2019 8:00 AM Performed by: Roderic Palau, MD Pre-anesthesia Checklist: Patient identified, Emergency Drugs available, Suction available and Patient being monitored Patient Re-evaluated:Patient Re-evaluated prior to induction Oxygen Delivery Method: Circle System Utilized Preoxygenation: Pre-oxygenation with 100% oxygen Induction Type: IV induction Ventilation: Mask ventilation without difficulty Grade View: Grade I Endobronchial tube: Left, Double lumen EBT, EBT position confirmed by fiberoptic bronchoscope and EBT position confirmed by auscultation and 35 Fr Number of attempts: 5 or more Airway Equipment and Method: Fiberoptic brochoscope Placement Confirmation: ETT inserted through vocal cords under direct vision,  positive ETCO2 and breath sounds checked- equal and bilateral Secured at: 26 cm Tube secured with: Tape Dental Injury: Teeth and Oropharynx as per pre-operative assessment  Comments: DL MAC 3  by CRNA grade I view, attempts x2 to place 46F L DLT unsuccessful. DLx2 MAC 3 MDA grade I view 52F L DLT unsuccessful. 1 attempt to pass 52F L DLT over cook catheter unsuccessful. 1 attempt with 52F L DLT passed over fiberoptic bronchoscope successful. Anesthetic maintained during all attempts and mask ventilated without issue. VSS. Pt has small frame and prominent teeth.

## 2019-03-12 NOTE — Brief Op Note (Signed)
      [address]       [place] [address]             [phone]     03/12/2019  10:53 AM  PATIENT:  Lauren Massey  64 y.o. female  PRE-OPERATIVE DIAGNOSIS:  Right Pneumothorax- recurrent   POST-OPERATIVE DIAGNOSIS:  Same   PROCEDURE:  Procedure(s): RIGHT XI ROBOTIC ASSISTED THORASCOPY-PLEURECTOMY and PLEURADESIS (Right) Video Bronchoscopy (N/A)  SURGEON:  Surgeon(s) and Role:    * Grace Isaac, MD - Primary    * Lightfoot, Lucile Crater, MD - Assisting   ANESTHESIA:   general  EBL:  20 mL   BLOOD ADMINISTERED:none  DRAINS: 28 chest tube    LOCAL MEDICATIONS USED:  BUPIVICAINE   SPECIMEN:  Source of Specimen:  plura   DISPOSITION OF SPECIMEN:  PATHOLOGY  COUNTS:  YES  DICTATION: .Dragon Dictation  PLAN OF CARE: inpatient   PATIENT DISPOSITION:  PACU - hemodynamically stable.   Delay start of Pharmacological VTE agent (>24hrs) due to surgical blood loss or risk of bleeding: yes

## 2019-03-12 NOTE — Addendum Note (Signed)
Addendum  created 03/12/19 1408 by Leonor Liv, CRNA   Charge Capture section accepted

## 2019-03-12 NOTE — Anesthesia Postprocedure Evaluation (Signed)
Anesthesia Post Note  Patient: Lauren Massey  Procedure(s) Performed: RIGHT XI ROBOTIC ASSISTED THORASCOPY-PLEURECTOMY and PLEURADESIS (Right Chest) Video Bronchoscopy (N/A ) Intercostal Nerve Block (Right Chest)     Patient location during evaluation: PACU Anesthesia Type: General Level of consciousness: awake and alert Pain management: pain level controlled Vital Signs Assessment: post-procedure vital signs reviewed and stable Respiratory status: spontaneous breathing, nonlabored ventilation, respiratory function stable and patient connected to nasal cannula oxygen Cardiovascular status: blood pressure returned to baseline and stable Postop Assessment: no apparent nausea or vomiting Anesthetic complications: no    Last Vitals:  Vitals:   03/12/19 1327 03/12/19 1330  BP:    Pulse: 65 62  Resp: 16 20  Temp:    SpO2: 100% 99%    Last Pain:  Vitals:   03/12/19 1322  TempSrc:   PainSc: 0-No pain                 Rashauna Tep,W. EDMOND

## 2019-03-13 ENCOUNTER — Inpatient Hospital Stay (HOSPITAL_COMMUNITY): Payer: 59

## 2019-03-13 DIAGNOSIS — I10 Essential (primary) hypertension: Secondary | ICD-10-CM

## 2019-03-13 LAB — CBC
HCT: 40.8 % (ref 36.0–46.0)
Hemoglobin: 12.1 g/dL (ref 12.0–15.0)
MCH: 27.9 pg (ref 26.0–34.0)
MCHC: 29.7 g/dL — ABNORMAL LOW (ref 30.0–36.0)
MCV: 94.2 fL (ref 80.0–100.0)
Platelets: 202 10*3/uL (ref 150–400)
RBC: 4.33 MIL/uL (ref 3.87–5.11)
RDW: 13.4 % (ref 11.5–15.5)
WBC: 6.2 10*3/uL (ref 4.0–10.5)
nRBC: 0 % (ref 0.0–0.2)

## 2019-03-13 LAB — BASIC METABOLIC PANEL
Anion gap: 10 (ref 5–15)
BUN: 14 mg/dL (ref 8–23)
CO2: 31 mmol/L (ref 22–32)
Calcium: 9.5 mg/dL (ref 8.9–10.3)
Chloride: 94 mmol/L — ABNORMAL LOW (ref 98–111)
Creatinine, Ser: 0.67 mg/dL (ref 0.44–1.00)
GFR calc Af Amer: >60 mL/min (ref >60)
GFR calc non Af Amer: >60 mL/min (ref >60)
Glucose, Bld: 109 mg/dL — ABNORMAL HIGH (ref 70–99)
Potassium: 4.5 mmol/L (ref 3.5–5.1)
Sodium: 135 mmol/L (ref 135–145)

## 2019-03-13 LAB — SURGICAL PATHOLOGY

## 2019-03-13 MED ORDER — ENOXAPARIN SODIUM 30 MG/0.3ML ~~LOC~~ SOLN
30.0000 mg | SUBCUTANEOUS | Status: DC
  Administered 2019-03-13 – 2019-03-14 (×2): 30 mg via SUBCUTANEOUS
  Filled 2019-03-13 (×2): qty 0.3

## 2019-03-13 NOTE — Progress Notes (Addendum)
      [address]       [place] [address]             [phone]       1 Day Post-Op Procedure(s) (LRB): RIGHT XI ROBOTIC ASSISTED THORASCOPY-PLEURECTOMY and PLEURADESIS (Right) Video Bronchoscopy (N/A) Intercostal Nerve Block (Right)  Subjective: Patient eating ice cream this am. She states her pain is under good control. She denies nausea.  Objective: Vital signs in last 24 hours: Temp:  [96.8 F (36 C)-98 F (36.7 C)] 97.7 F (36.5 C) (02/17 0350) Pulse Rate:  [57-78] 57 (02/17 0350) Cardiac Rhythm: Sinus bradycardia (02/17 0350) Resp:  [13-24] 20 (02/17 0350) BP: (129-184)/(73-121) 130/74 (02/17 0350) SpO2:  [90 %-100 %] 100 % (02/17 0350) Arterial Line BP: (135-184)/(61-85) 139/67 (02/16 1600) Weight:  [60.6 kg] 60.6 kg (02/17 0653)     Intake/Output from previous day: 02/16 0701 - 02/17 0700 In: 2031.9 [P.O.:720; I.V.:1221.9; IV Piggyback:90] Out: [id number] [Urine:1315; Blood:20; Chest Tube:130]   Physical Exam:  Cardiovascular: RRR Pulmonary: Clear to auscultation bilaterally Abdomen: Soft, non tender, bowel sounds present. Extremities: SCDs in place Wounds: Clean and dry.  No erythema or signs of infection. Chest Tube: to suction,no air leak  Lab Results: CBC: Recent Labs    03/11/19 1641 03/12/19 0154  WBC 3.1* 3.2*  HGB 13.1 12.3  HCT 42.6 40.7  PLT 203 211   BMET:  Recent Labs    03/12/19 0154 03/13/19 0239  NA 136 135  K 3.9 4.5  CL 95* 94*  CO2 33* 31  GLUCOSE 115* 109*  BUN 11 14  CREATININE 0.71 0.67  CALCIUM 9.6 9.5    PT/INR:  Recent Labs    03/11/19 0300  LABPROT 13.3  INR 1.0   ABG:  INR: Will add last result for INR, ABG once components are confirmed Will add last 4 CBG results once components are confirmed  Assessment/Plan:  1. CV - SR,first degree heart block with HR in the 60's. On HCTZ 12.5 mg daily as taken prior to surgery. 2.  Pulmonary - On 3 liters of oxygen via [place]. Wean as able. Chest  tube with 130 cc of output last 24 hours. Chest tube is to suction and there is no air leak. No CXR ordered for this am so will order and follow up. Hope to place chest tube to water seal soon. 3. Remove foley, decrease IVF   Donielle M ZimmermanPA-C 03/13/2019,7:12 AM [phone]  Chest xray reviewed - stable no ptx no air leak- ct to water seal I have seen and examined Wanona C Sellen and agree with the above assessment  and plan.  Grace Isaac MD Beeper [phone] Office [phone] 03/13/2019 8:07 AM

## 2019-03-13 NOTE — Plan of Care (Signed)
  Problem: Clinical Measurements: Goal: Ability to maintain clinical measurements within normal limits will improve Outcome: Progressing Goal: Will remain free from infection Outcome: Progressing Goal: Diagnostic test results will improve Outcome: Progressing   Problem: Activity: Goal: Risk for activity intolerance will decrease Outcome: Progressing   Problem: Nutrition: Goal: Adequate nutrition will be maintained Outcome: Progressing   Problem: Coping: Goal: Level of anxiety will decrease Outcome: Progressing   Problem: Education: Goal: Knowledge of disease or condition will improve Outcome: Progressing   Problem: Activity: Goal: Risk for activity intolerance will decrease Outcome: Progressing

## 2019-03-13 NOTE — Progress Notes (Signed)
NAME:  Lauren Massey, MRN:  [id number], DOB:  10-24-1955, LOS: 7 ADMISSION DATE:  03/06/2019, CONSULTATION DATE:  2/10 REFERRING MD:  Dr. Johnney Killian, CHIEF COMPLAINT:  Pneumothorax   Brief History   64 year old female with PMH significant for chronic fibrotic sarcoidosis with multisystem involvement including lungs, skin, and eyes. Diagnosed via bronchoscopy in 2000. She is followed by Dr. Annamaria Boots. She was seen in the pulmonary clinic on 2/5 with no PTX on film.  She presented again to the pulmonary clinic with complaints of dyspnea on 2/10 and was noted to have decreased breath sounds on the R and hypoxia with O2 sats 70% on room air. CXR done in clinic showed large R sided PTX and she was sent to ED. Upon arrival to ED PCCM was called to admission and chest tube placement.   Past Medical History  Chronic Fibrotic Sarcoidosis - lung, skin, eye involvement, followed by Dr. Annamaria Boots.  Significant Hospital Events   2/10 Admit for recurrent PTX 2/15 No air leak from chest tube  03/13/2019: No airleak chest tube  Consults:  CTS PCCM  Procedures:  L CT 53F 2/10 >  Significant Diagnostic Tests:  CT Chest 2/12 >> Right chest tube in place. Small anterior right pneumothorax and small apical pneumothorax. Severe chronic cystic lung disease in the lower lobes  Micro Data:  COVID 2/10 >> negative  Influenza A/B 2/10 >> negative   Antimicrobials:     Interim history/subjective:  Patient has only complaints of pain at chest tube insertion site.  Playing cell phone card game.  Objective   Blood pressure 130/74, pulse (!) 57, temperature 97.7 F (36.5 C), temperature source Oral, resp. rate 20, height 5\' 2"  (1.575 m), weight 60.6 kg, SpO2 100 %.        Intake/Output Summary (Last 24 hours) at 03/13/2019 0724 Last data filed at 03/13/2019 0500 Gross per 24 hour  Intake 2031.88 ml  Output 1465 ml  Net 566.88 ml   Filed Weights   03/06/19 2146 03/08/19 0500 03/13/19 0653  Weight: 62.5 kg  62.9 kg 60.6 kg   Physical Exam: General: Female, resting in bed no distress HEENT: Mucous membranes moist, sclera clear tracking appropriately NCAT Neuro: Alert oriented x3 following commands moves all 4 CV: Regular rate rhythm, S1-S2 PULM: No crackles no wheeze, GI: Soft, nontender Extremities: No edema Skin: No rash  Resolved Hospital Problem list     Assessment & Plan:   Recurrent Right Pneumothorax secondary to Severe Chronic Cystic Lung Disease in setting of Sarcoidosis  Complicated clinical course with recurrent right sided collapse.   Status post VATS per cardiothoracic surgery -We appreciate their assistance and expertise. -Postop pain control per surgery service -Postop chest tube management per surgery service  Sarcoidosis Restrictive Lung Disease Bronchiectasis  Suspected Pulmonary Hypertension  Lung, eye and skin involvement. On methotrexate Q Wednesday (not ordered this admit). Followed by Dr. Annamaria Boots.  FVC 1.62/66%, FEV1 1.46/76%, ratio 0.90, TLC 50%, DLCO 26%.   We will arrange follow-up in pulmonary clinic -Continue methotrexate once weekly  Iridocyclitis -Continue Pred forte  Hypertension Continue hydrochlorothiazide   Best practice:  Diet: regular diet Pain/Anxiety/Delirium protocol (if indicated): n/a VAP protocol (if indicated): n/a DVT prophylaxis: SQ heparin GI prophylaxis: NA Glucose control: NA Mobility: Up with assist Code Status: Full Code  Family Communication: patient and husband updated on plan of care 2/15. Husband Myrene Buddy) [phone].  Daughter Aleatha Borer [place]) [phone] Disposition: Med-Surg   LABS    PULMONARY No  results for input(s): PHART, PCO2ART, PO2ART, HCO3, TCO2, O2SAT in the last 168 hours.  Invalid input(s): PCO2, PO2  CBC Recent Labs  Lab 03/11/19 0300 03/11/19 1641 03/12/19 0154  HGB 13.4 13.1 12.3  HCT 44.7 42.6 40.7  WBC 3.6* 3.1* 3.2*  PLT 122* 203 211    COAGULATION Recent Labs   Lab 03/06/19 1719 03/11/19 0300  INR 1.0 1.0    CARDIAC  No results for input(s): TROPONINI in the last 168 hours. No results for input(s): PROBNP in the last 168 hours.   CHEMISTRY Recent Labs  Lab 03/06/19 1730 03/06/19 1730 03/11/19 0300 03/11/19 0300 03/12/19 0154 03/13/19 0239  NA 136  --  133*  --  136 135  K 4.1   < > 4.5   < > 3.9 4.5  CL 100  --  95*  --  95* 94*  CO2 28  --  28  --  33* 31  GLUCOSE 106*  --  98  --  115* 109*  BUN 10  --  13  --  11 14  CREATININE 0.68  --  0.76  --  0.71 0.67  CALCIUM 9.0  --  9.5  --  9.6 9.5  MG  --   --  2.0  --  2.0  --   PHOS  --   --  3.6  --  3.8  --    < > = values in this interval not displayed.   Estimated Creatinine Clearance: 60.9 mL/min (by C-G formula based on SCr of 0.67 mg/dL).   LIVER Recent Labs  Lab 03/06/19 1719 03/11/19 0300 03/12/19 0154  AST  --  45* 44*  ALT  --  33 35  ALKPHOS  --  54 60  BILITOT  --  0.7 0.6  PROT  --  9.0* 8.8*  ALBUMIN  --  3.0* 2.9*  INR 1.0 1.0  --      INFECTIOUS No results for input(s): LATICACIDVEN, PROCALCITON in the last 168 hours.   ENDOCRINE CBG (last 3)  No results for input(s): GLUCAP in the last 72 hours.   IMAGING  DG Chest Port 1 View  Result Date: 03/12/2019 CLINICAL DATA:  Postop EXAM: PORTABLE CHEST 1 VIEW COMPARISON:  03/11/2019 FINDINGS: Right apical chest tube. Prior pneumothorax is no longer visualized. Multifocal patchy/interstitial opacities in this patient with known sarcoidosis. No pleural effusion or pneumothorax. The heart is normal in size. IMPRESSION: Right apical chest tube. Prior pneumothorax is no longer visualized. Electronically Signed   By: Julian Hy M.D.   On: 03/12/2019 11:23        Garner Nash, DO [facility] 03/13/2019 7:24 AM

## 2019-03-13 NOTE — Discharge Instructions (Signed)
ACTIVITY:  1.Increase activity slowly. 2.Walk daily and increase frequency and duration as tolerates. 3.May walk up steps. 4.No lifting more than ten pounds for two weeks. 5.No driving for two weeks. 6.Avoid straining. 7.STOP any activity that causes chest pain, shortness of breath, dizziness,sweating,     or excessive weakness. 8.Continue with breathing exercises daily.  DIET:  Heart healthy   WOUND:  1.May shower. 2.Clean wounds with mild soap and water.  Call the office at [phone] if any problems arise.  

## 2019-03-14 ENCOUNTER — Inpatient Hospital Stay (HOSPITAL_COMMUNITY): Payer: 59

## 2019-03-14 NOTE — Progress Notes (Signed)
NAME:  Lauren Massey, MRN:  [id number], DOB:  [birth year], LOS: 57 ADMISSION DATE:  03/06/2019, CONSULTATION DATE:  2/10 REFERRING MD:  Dr. Johnney Killian, CHIEF COMPLAINT:  Pneumothorax   Brief History   64 year old female with PMH significant for chronic fibrotic sarcoidosis with multisystem involvement including lungs, skin, and eyes. Diagnosed via bronchoscopy in 2000. She is followed by Dr. Annamaria Boots. She was seen in the pulmonary clinic on 2/5 with no PTX on film.  She presented again to the pulmonary clinic with complaints of dyspnea on 2/10 and was noted to have decreased breath sounds on the R and hypoxia with O2 sats 70% on room air. CXR done in clinic showed large R sided PTX and she was sent to ED. Upon arrival to ED PCCM was called to admission and chest tube placement.   Past Medical History  Chronic Fibrotic Sarcoidosis - lung, skin, eye involvement, followed by Dr. Annamaria Boots.  Significant Hospital Events   2/10 Admit for recurrent PTX 2/15 No air leak from chest tube  03/13/2019: No airleak chest tube  Consults:  CTS PCCM  Procedures:  L CT 78F 2/10 >  Significant Diagnostic Tests:  CT Chest 2/12 >> Right chest tube in place. Small anterior right pneumothorax and small apical pneumothorax. Severe chronic cystic lung disease in the lower lobes  Micro Data:  COVID 2/10 >> negative  Influenza A/B 2/10 >> negative   Antimicrobials:     Interim history/subjective:  Alert oriented sitting in the bed.  Pain well controlled.  Objective   Blood pressure 109/90, pulse 70, temperature (!) 97 F (36.1 C), temperature source Axillary, resp. rate (!) 22, height 5\' 2"  (1.575 m), weight 60.6 kg, SpO2 97 %.        Intake/Output Summary (Last 24 hours) at 03/14/2019 1005 Last data filed at 03/14/2019 0756 Gross per 24 hour  Intake 480 ml  Output 30 ml  Net 450 ml   Filed Weights   03/08/19 0500 03/13/19 0653 03/14/19 0131  Weight: 62.9 kg 60.6 kg 60.6 kg   Physical Exam: General:  Female, resting in bed no distress HEENT: Mucous membranes moist, sclera clear, tracking appropriately NCAT Neuro: Alert oriented following commands moves all 4 extremities CV: Regular rate rhythm, S1-S2 PULM: No crackles no wheeze GI: Soft, nontender Extremities: No edema Skin: No rash  Resolved Hospital Problem list     Assessment & Plan:   Recurrent Right Pneumothorax secondary to Severe Chronic Cystic Lung Disease in setting of Sarcoidosis  Complicated clinical course with recurrent right sided collapse.   Status post VATS per cardiothoracic surgery -CT management per CTSx  - Once CT removed maybe can be discharged?   Sarcoidosis Restrictive Lung Disease Bronchiectasis  Suspected Pulmonary Hypertension  Lung, eye and skin involvement. On methotrexate Q Wednesday (not ordered this admit). Followed by Dr. Annamaria Boots.  FVC 1.62/66%, FEV1 1.46/76%, ratio 0.90, TLC 50%, DLCO 26%.   We will arrange follow-up in pulmonary clinic - MTX once weekly  Iridocyclitis - continue predforte   Hypertension Continue HCTZ    Best practice:  Diet: regular diet Pain/Anxiety/Delirium protocol (if indicated): n/a VAP protocol (if indicated): n/a DVT prophylaxis: SQ heparin GI prophylaxis: NA Glucose control: NA Mobility: Up with assist Code Status: Full Code  Family Communication: patient and husband updated on plan of care 2/15. Husband Myrene Buddy) [phone].  Daughter Aleatha Borer [place]) [phone] Disposition: Med-Surg   LABS    PULMONARY No results for input(s): PHART, PCO2ART, PO2ART, HCO3, TCO2, O2SAT  in the last 168 hours.  Invalid input(s): PCO2, PO2  CBC Recent Labs  Lab 03/11/19 1641 03/12/19 0154 03/13/19 0631  HGB 13.1 12.3 12.1  HCT 42.6 40.7 40.8  WBC 3.1* 3.2* 6.2  PLT 203 211 202    COAGULATION Recent Labs  Lab 03/11/19 0300  INR 1.0    CARDIAC  No results for input(s): TROPONINI in the last 168 hours. No results for input(s): PROBNP in  the last 168 hours.   CHEMISTRY Recent Labs  Lab 03/11/19 0300 03/11/19 0300 03/12/19 0154 03/13/19 0239  NA 133*  --  136 135  K 4.5   < > 3.9 4.5  CL 95*  --  95* 94*  CO2 28  --  33* 31  GLUCOSE 98  --  115* 109*  BUN 13  --  11 14  CREATININE 0.76  --  0.71 0.67  CALCIUM 9.5  --  9.6 9.5  MG 2.0  --  2.0  --   PHOS 3.6  --  3.8  --    < > = values in this interval not displayed.   Estimated Creatinine Clearance: 60.9 mL/min (by C-G formula based on SCr of 0.67 mg/dL).   LIVER Recent Labs  Lab 03/11/19 0300 03/12/19 0154  AST 45* 44*  ALT 33 35  ALKPHOS 54 60  BILITOT 0.7 0.6  PROT 9.0* 8.8*  ALBUMIN 3.0* 2.9*  INR 1.0  --      INFECTIOUS No results for input(s): LATICACIDVEN, PROCALCITON in the last 168 hours.   ENDOCRINE CBG (last 3)  No results for input(s): GLUCAP in the last 72 hours.   IMAGING  DG CHEST PORT 1 VIEW  Result Date: 03/14/2019 CLINICAL DATA:  Chest tube and shortness of breath EXAM: PORTABLE CHEST 1 VIEW COMPARISON:  Yesterday FINDINGS: Unchanged diffuse reticular opacity. There is marked pulmonary fibrosis by CT. Right-sided chest tube with less than 5% pneumothorax at the apex. Stable heart size. IMPRESSION: 1. Increase right pneumothorax that is less than 5%. 2. Stable chest tube positioning. 3. Pulmonary fibrosis without acute superimposed finding. Electronically Signed   By: Monte Fantasia M.D.   On: 03/14/2019 07:35   DG CHEST PORT 1 VIEW  Result Date: 03/13/2019 CLINICAL DATA:  Follow-up pneumothorax. Shortness of breath. EXAM: PORTABLE CHEST 1 VIEW COMPARISON:  03/12/19 FINDINGS: There is a right chest tube in place. Tiny right upper lobe pneumothorax is identified measuring a few mm in thickness. Heart size is normal. Diffuse bilateral interstitial and airspace opacities are stable from previous exam. No superimposed findings. Aortic atherosclerosis. IMPRESSION: Stable tiny right upper lobe pneumothorax with chest tube in place.  Electronically Signed   By: Kerby Moors M.D.   On: 03/13/2019 09:39   DG Chest Port 1 View  Result Date: 03/12/2019 CLINICAL DATA:  Postop EXAM: PORTABLE CHEST 1 VIEW COMPARISON:  03/11/2019 FINDINGS: Right apical chest tube. Prior pneumothorax is no longer visualized. Multifocal patchy/interstitial opacities in this patient with known sarcoidosis. No pleural effusion or pneumothorax. The heart is normal in size. IMPRESSION: Right apical chest tube. Prior pneumothorax is no longer visualized. Electronically Signed   By: Julian Hy M.D.   On: 03/12/2019 11:23        Garner Nash, DO [facility] 03/14/2019 10:05 AM

## 2019-03-14 NOTE — Progress Notes (Addendum)
      [address]       [place] [address]             [phone]       2 Days Post-Op Procedure(s) (LRB): RIGHT XI ROBOTIC ASSISTED THORASCOPY-PLEURECTOMY and PLEURADESIS (Right) Video Bronchoscopy (N/A) Intercostal Nerve Block (Right)  Subjective: Patient eating breakfast this am. She had a bowel movement.  Objective: Vital signs in last 24 hours: Temp:  [97.9 F (36.6 C)-98.5 F (36.9 C)] 98 F (36.7 C) (02/18 0336) Pulse Rate:  [56-71] 67 (02/18 0336) Cardiac Rhythm: Sinus bradycardia (02/18 0703) Resp:  [14-23] 22 (02/18 0336) BP: (103-140)/(59-81) 140/81 (02/18 0336) SpO2:  [91 %-99 %] 91 % (02/18 0336) Weight:  [60.6 kg] 60.6 kg (02/18 0131)     Intake/Output from previous day: 02/17 0701 - 02/18 0700 In: 480 [P.O.:480] Out: 620 [Urine:600; Chest Tube:20]   Physical Exam:  Cardiovascular: RRR Pulmonary: Clear to auscultation bilaterally Abdomen: Soft, non tender, bowel sounds present. Extremities: SCDs in place Wounds: Clean and dry.  No erythema or signs of infection. Chest Tube: to water seal,no air leak  Lab Results: CBC: Recent Labs    03/12/19 0154 03/13/19 0631  WBC 3.2* 6.2  HGB 12.3 12.1  HCT 40.7 40.8  PLT 211 202   BMET:  Recent Labs    03/12/19 0154 03/13/19 0239  NA 136 135  K 3.9 4.5  CL 95* 94*  CO2 33* 31  GLUCOSE 115* 109*  BUN 11 14  CREATININE 0.71 0.67  CALCIUM 9.6 9.5    PT/INR:  No results for input(s): LABPROT, INR in the last 72 hours. ABG:  INR: Will add last result for INR, ABG once components are confirmed Will add last 4 CBG results once components are confirmed  Assessment/Plan:  1. CV - SB at times, first degree heart block . On HCTZ 12.5 mg daily as taken prior to surgery. 2.  Pulmonary - Probable pulmonary hypertension, sarcoidosis, bronchiectasis. On 1 liter of oxygen via [place]. Wean as able. Chest tube with 20 cc of output last 24 hours. Chest tube is to water seal and there is no  air leak. CXR this am appears to show small right apical pneumothorax. Hope to remove chest tube soon. Check CXR in am. Encourage incentive spirometer    Sharalyn Ink [facility] 03/14/2019,7:25 AM [phone] Ambulate today , with pleurodesis will leave chest tube until tomorrow to endure full adhesion  Home sat  I have seen and examined Genette C Vivas and agree with the above assessment  and plan.  Grace Isaac MD [facility] [phone] Office [phone] 03/14/2019 8:21 AM

## 2019-03-14 NOTE — Op Note (Signed)
NAME: Lauren Massey, Lauren Massey MEDICAL RECORD [id number] ACCOUNT [id number] DATE OF BIRTH:September 10, 1955 FACILITY: [facility] LOCATION: [facility] PHYSICIAN:Dellar Traber Maryruth Bun, MD  OPERATIVE REPORT  DATE OF PROCEDURE:  03/12/2019  PREOPERATIVE DIAGNOSIS:  Recurrent right pneumothorax with underlying severe pulmonary sarcoidosis.  POSTOPERATIVE DIAGNOSIS:  Recurrent right pneumothorax with underlying severe pulmonary sarcoidosis.  SURGICAL PROCEDURES:   1.  Right robotic-assisted thoracoscopy.  2.  Pleurectomy.  3.  Pleurodesis. 4.  Video bronchoscopy.  SURGEON:  Lanelle Bal, MD  FIRST ASSISTANT:  Melodie Bouillon, MD  BRIEF HISTORY:  The patient is a 64 year old female who has been followed since 2000 with pulmonary sarcoidosis.  Most recently, the patient had been admitted to the pulmonary service with spontaneous right pneumothorax.  A chest tube had been placed and  ultimately removed and discharged home.  The patient after discharge developed increasing shortness of breath and followup chest x-ray showed recurrent right pneumothorax.  The patient was readmitted and a chest tube was placed.  With a second  occurrence, thoracic surgery consultation was obtained.  CT of the chest was recommended which showed diffuse bullous disease.  The patient is intermittently on home oxygen.  She does still remain relatively active, including working full time.  After  discussion with the patient and her daughter about treatment options to prevent recurrent pneumothorax, we recommended that the most effective method would be robotic pleurectomy as opposed to talc slurry per chest tube.  Risks and options were reviewed  in detail and the patient was agreeable with proceeding.  DESCRIPTION OF PROCEDURE:  The patient had signed informed consent.  The right side had been preoperatively marked.  The patient was brought to the operating room and a double lumen endotracheal tube was placed by Dr. Ola Spurr.  This  was moderately  difficult to get placed, but ultimately with the smaller double lumen endotracheal tube, the tube could be placed.  It was positioned.  Appropriate timeout was performed and fiberoptic bronchoscopy was done through the endotracheal tube, confirming good  position of the tube.  The right lung was isolated.  The previously placed right chest tube was removed.  The patient was turned in the lateral decubitus position with the right side up.  The right chest was prepped with Betadine, draped in a sterile  manner.  We then decided to proceed with 3 arms for the robotic approach to ensure no injury to the underlying lung.  On examination of the chest x-ray and the patient's CT, we decided to use a 5 mm port higher to enter the chest initially and confirm  any adhesions in the lower chest that might interfere with placement of robotic arms.  Through the old chest tube site, a second timeout was performed.  Then, a 5 mm port was placed through the previously placed chest tube port.  A 5 mm scope was placed  into the chest.  With confirmation of location, mild insufflation was started.  With the scope in place and with the upper and middle lobe not adherent to the chest wall, we were able to adequately place 8 mm robotic ports anterior, approximately the 7th  intercostal space, a second port for camera in the midaxillary line and a third 8 mm port more posteriorly.  The robot arms were then docked and we proceeded with work from the console.  A small roll of Surgicel and a rolled sponge had been placed into  the chest to assist in retraction.  Initially, the chest was carefully examined.  There  was no obvious site of pleural disruption on the lung and we purposely avoided manipulation of the lung, especially the lower lobe to prevent any injury to the lung.   We then proceeded with a pleurectomy lateral, anterior and apically.  Using a monopolar spatula, the pleura was raised posterior off a rib and  then slowly worked superiorly and anteriorly, teasing the pleura off the inner chest wall, cauterizing any  bleeders as we proceeded.  With a full pleurectomy completed, we then excised ____ .  Pleura was excised.  We performed a mechanical pleurodesis on the lower chest wall.  The robot was then undocked and under direct camera vision, the pleural specimen  was removed through the anterior-most port site.  We then proceeded with blocking of intercostal nerves with direct injection of Exparel containing saline solution along the intercostal spaces 3, 4, 5, 6 and 7.  A 28 chest tube was placed through the  most anterior port and under camera vision, the lung was reinflated without obvious air leak.  The 28 chest tube was secured in place.  The other 2 remaining ports were removed and closed with interrupted Vicryl sutures and a subcuticular stitch in skin  edges.  Dermabond was applied.  At the completion of the case, the chest tube was attached to the Pleur-evac with no obvious air leak.  The patient was then awakened and extubated in the operating room.  Sponge and needle count was reported as correct at  completion of the procedure.  Estimated blood loss was 50 mL.  The patient tolerated the procedure without obvious complication and was then transferred to the recovery room for further postoperative care.  VN/NUANCE  D:03/14/2019 T:03/14/2019 JOB:[id number]

## 2019-03-15 ENCOUNTER — Inpatient Hospital Stay (HOSPITAL_COMMUNITY): Payer: 59

## 2019-03-15 MED ORDER — OXYCODONE HCL 5 MG PO TABS: 5.0000 mg | ORAL_TABLET | Freq: Four times a day (QID) | ORAL | 0 refills | Status: AC | PRN

## 2019-03-15 NOTE — Progress Notes (Signed)
NAME:  Lauren Massey, MRN:  [id number], DOB:  [birth year], LOS: 28 ADMISSION DATE:  03/06/2019, CONSULTATION DATE:  2/10 REFERRING MD:  Dr. Johnney Killian, CHIEF COMPLAINT:  Pneumothorax   Brief History   64 year old female with PMH significant for chronic fibrotic sarcoidosis with multisystem involvement including lungs, skin, and eyes. Diagnosed via bronchoscopy in 2000. She is followed by Dr. Annamaria Boots. She was seen in the pulmonary clinic on 2/5 with no PTX on film.  She presented again to the pulmonary clinic with complaints of dyspnea on 2/10 and was noted to have decreased breath sounds on the R and hypoxia with O2 sats 70% on room air. CXR done in clinic showed large R sided PTX and she was sent to ED. Upon arrival to ED PCCM was called to admission and chest tube placement.   Past Medical History  Chronic Fibrotic Sarcoidosis - lung, skin, eye involvement, followed by Dr. Annamaria Boots.  Significant Hospital Events   2/10 Admit for recurrent PTX 2/15 No air leak from chest tube  03/13/2019: No airleak chest tube  Consults:  CTS PCCM  Procedures:  L CT 96F 2/10 >  Significant Diagnostic Tests:  CT Chest 2/12 >> Right chest tube in place. Small anterior right pneumothorax and small apical pneumothorax. Severe chronic cystic lung disease in the lower lobes  Micro Data:  COVID 2/10 >> negative  Influenza A/B 2/10 >> negative   Antimicrobials:     Interim history/subjective:   Comfortable sitting in chair.  No pain today.  Looking forward to potentially going home  Objective   Blood pressure 119/83, pulse 78, temperature 98.6 F (37 C), temperature source Oral, resp. rate 16, height 5\' 2"  (1.575 m), weight 60.2 kg, SpO2 100 %.        Intake/Output Summary (Last 24 hours) at 03/15/2019 0844 Last data filed at 03/15/2019 0400 Gross per 24 hour  Intake --  Output 50 ml  Net -50 ml   Filed Weights   03/13/19 0653 03/14/19 0131 03/15/19 0133  Weight: 60.6 kg 60.6 kg 60.2 kg    Physical Exam: General: Female resting in chair no distress HEENT: NCAT, sclera clear Neuro: Alert oriented following commands CV: The rate rhythm S1-S2 PULM: No crackles no wheeze GI: Soft, nontender nondistended Extremities: No edema Skin: No rash  Resolved Hospital Problem list     Assessment & Plan:   Recurrent Right Pneumothorax secondary to Severe Chronic Cystic Lung Disease in setting of Sarcoidosis  Complicated clinical course with recurrent right sided collapse.   Status post VATS per cardiothoracic surgery Chest tube management per surgery Plans to remove tube today. Potential discharge for tomorrow morning  Sarcoidosis Restrictive Lung Disease Bronchiectasis  Suspected Pulmonary Hypertension  Lung, eye and skin involvement. On methotrexate Q Wednesday (not ordered this admit). Followed by Dr. Annamaria Boots.  FVC 1.62/66%, FEV1 1.46/76%, ratio 0.90, TLC 50%, DLCO 26%.   -We will arrange follow-up in pulmonary clinic upon discharge  Iridocyclitis Continue Pred forte  Hypertension Continue hydrochlorothiazide   Best practice:  Diet: regular diet Pain/Anxiety/Delirium protocol (if indicated): n/a VAP protocol (if indicated): n/a DVT prophylaxis: SQ heparin GI prophylaxis: NA Glucose control: NA Mobility: Up with assist Code Status: Full Code  Family Communication: patient and husband updated on plan of care 2/15. Husband Myrene Buddy) [phone].  Daughter Aleatha Borer [place]) [phone] Disposition: Med-Surg   LABS    PULMONARY No results for input(s): PHART, PCO2ART, PO2ART, HCO3, TCO2, O2SAT in the last 168 hours.  Invalid input(s):  PCO2, PO2  CBC Recent Labs  Lab 03/11/19 1641 03/12/19 0154 03/13/19 0631  HGB 13.1 12.3 12.1  HCT 42.6 40.7 40.8  WBC 3.1* 3.2* 6.2  PLT 203 211 202    COAGULATION Recent Labs  Lab 03/11/19 0300  INR 1.0    CARDIAC  No results for input(s): TROPONINI in the last 168 hours. No results for input(s):  PROBNP in the last 168 hours.   CHEMISTRY Recent Labs  Lab 03/11/19 0300 03/11/19 0300 03/12/19 0154 03/13/19 0239  NA 133*  --  136 135  K 4.5   < > 3.9 4.5  CL 95*  --  95* 94*  CO2 28  --  33* 31  GLUCOSE 98  --  115* 109*  BUN 13  --  11 14  CREATININE 0.76  --  0.71 0.67  CALCIUM 9.5  --  9.6 9.5  MG 2.0  --  2.0  --   PHOS 3.6  --  3.8  --    < > = values in this interval not displayed.   Estimated Creatinine Clearance: 60.7 mL/min (by C-G formula based on SCr of 0.67 mg/dL).   LIVER Recent Labs  Lab 03/11/19 0300 03/12/19 0154  AST 45* 44*  ALT 33 35  ALKPHOS 54 60  BILITOT 0.7 0.6  PROT 9.0* 8.8*  ALBUMIN 3.0* 2.9*  INR 1.0  --      INFECTIOUS No results for input(s): LATICACIDVEN, PROCALCITON in the last 168 hours.   ENDOCRINE CBG (last 3)  No results for input(s): GLUCAP in the last 72 hours.   IMAGING  DG CHEST PORT 1 VIEW  Result Date: 03/15/2019 CLINICAL DATA:  Pneumothorax.  Chest tube. EXAM: PORTABLE CHEST 1 VIEW COMPARISON:  03/14/2019.  04/24/2015. FINDINGS: Right chest tube in stable position. Stable tiny right apical pneumothorax. Stable cardiomegaly. Chronic interstitial changes again noted bilaterally. Stable bibasal pleural thickening consistent with scarring. No acute bony abnormality. IMPRESSION: 1. Right chest tube in stable position. Stable tiny right apical pneumothorax. 2.  Chronic interstitial lung disease again noted. Electronically Signed   By: Marcello Moores  Register   On: 03/15/2019 06:42   DG CHEST PORT 1 VIEW  Result Date: 03/14/2019 CLINICAL DATA:  Chest tube and shortness of breath EXAM: PORTABLE CHEST 1 VIEW COMPARISON:  Yesterday FINDINGS: Unchanged diffuse reticular opacity. There is marked pulmonary fibrosis by CT. Right-sided chest tube with less than 5% pneumothorax at the apex. Stable heart size. IMPRESSION: 1. Increase right pneumothorax that is less than 5%. 2. Stable chest tube positioning. 3. Pulmonary fibrosis without  acute superimposed finding. Electronically Signed   By: Monte Fantasia M.D.   On: 03/14/2019 07:35        Garner Nash, DO [facility] 03/15/2019 8:44 AM

## 2019-03-15 NOTE — TOC Progression Note (Signed)
Transition of Care [facility]) - Progression Note    Patient Details  Name: Lauren Massey MRN: [id number] Date of Birth: 10/11/1955  Transition of Care [facility]) CM/SW Contact  Zenon Mayo, RN Phone Number: 03/15/2019, 4:45 PM  Clinical Narrative:    NCM spoke with patient, she states on her concentrator is [facility], this is [facility].  NCM contacted them at [phone], they do have patient down with them.  They will bring a tank in the am and check on her concentrator, they state to please call Ray on Saturday at [phone] to check to make sure this has been done.         Expected Discharge Plan and Services                                                 Social Determinants of Health (SDOH) Interventions    Readmission Risk Interventions No flowsheet data found.

## 2019-03-15 NOTE — Progress Notes (Signed)
Chest tube removed, patient tolerated well, no shortness of breath

## 2019-03-15 NOTE — Consult Note (Signed)
   [facility] CM Inpatient Consult   03/15/2019  MARGO BROOKHOUSE 1955-08-02 [id number]  Thank you for the referral. Patient reviewed for [facility] for post hospital care and disease management needs. Chart review reveals patient is for Recurrent right pneumothorax with underlying severe pulmonary sarcoidosis.  This Probation officer reached out to inpatient Transition of Care team, Neoma Laming [facility] regarding issues surrounding oxygen per referral request. Inpatient TOC team to assess criteria for home oxygen needs.  Will follow up with [facility] team for post hospital community support needs.    Patient with long length of stay and less than 30 days readmission noted in the [employer].  Primary Care Provider:  Pricilla Holm, MD   For questions, please contact:  Natividad Brood, RN BSN [facility] Liaison  [phone] business mobile phone Toll free office [phone]  Fax number: [phone] [email or web address]

## 2019-03-15 NOTE — Progress Notes (Addendum)
      [address]       [place] [address]             [phone]       3 Days Post-Op Procedure(s) (LRB): RIGHT XI ROBOTIC ASSISTED THORASCOPY-PLEURECTOMY and PLEURADESIS (Right) Video Bronchoscopy (N/A) Intercostal Nerve Block (Right)  Subjective: Patient eating breakfast this am. She had a bowel movement.  Objective: Vital signs in last 24 hours: Temp:  [97 F (36.1 C)-98.3 F (36.8 C)] 98.1 F (36.7 C) (02/19 0008) Pulse Rate:  [61-71] 61 (02/19 0008) Cardiac Rhythm: Normal sinus rhythm;Heart block (02/19 0701) Resp:  [14-27] 14 (02/19 0008) BP: (109-126)/(66-90) 116/66 (02/19 0008) SpO2:  [94 %-99 %] 99 % (02/19 0008) Weight:  [60.2 kg] 60.2 kg (02/19 0133)     Intake/Output from previous day: 02/18 0701 - 02/19 0700 In: 240 [P.O.:240] Out: 60 [Chest Tube:60]   Physical Exam:  Cardiovascular: RRR Pulmonary: Clear to auscultation bilaterally Abdomen: Soft, non tender, bowel sounds present. Extremities: SCDs in place Wounds: Clean and dry.  No erythema or signs of infection. Chest Tube: to water seal,no air leak  Lab Results: CBC: Recent Labs    03/13/19 0631  WBC 6.2  HGB 12.1  HCT 40.8  PLT 202   BMET:  Recent Labs    03/13/19 0239  NA 135  K 4.5  CL 94*  CO2 31  GLUCOSE 109*  BUN 14  CREATININE 0.67  CALCIUM 9.5    PT/INR:  No results for input(s): LABPROT, INR in the last 72 hours. ABG:  INR: Will add last result for INR, ABG once components are confirmed Will add last 4 CBG results once components are confirmed  Assessment/Plan:  1. CV - SB at times, first degree heart block . On HCTZ 12.5 mg daily as taken prior to surgery. Will be followed by cardiology after discharge for work up for cardiac sarcoidosis 2.  Pulmonary - Probable pulmonary hypertension, sarcoidosis, bronchiectasis. On 1 liter of oxygen via [place]. Wean as able. Chest tube with 20 cc of output last 24 hours. Chest tube is to water seal and there is  no air leak. CXR this am appears to show small, stable right apical pneumothorax. Will remove chest tube. Check CXR in am. Encourage incentive spirometer 3. Hope to discharge in am if CXR stable   Sharalyn Ink [facility] 03/15/2019,7:28 AM [id number] Chest xray stable today- no air leak plan removal of chest tube  Patient has had home o2 for past 2 years- will care management it is confirm Functioning.  Plan d/c tomorrow  I have seen and examined Lauren Massey and agree with the above assessment  and plan.  Grace Isaac MD [place] [phone] Office [phone] 03/15/2019 7:39 AM

## 2019-03-16 ENCOUNTER — Telehealth: Payer: Self-pay | Admitting: Internal Medicine

## 2019-03-16 ENCOUNTER — Inpatient Hospital Stay (HOSPITAL_COMMUNITY): Payer: 59

## 2019-03-16 DIAGNOSIS — D860 Sarcoidosis of lung: Secondary | ICD-10-CM

## 2019-03-16 NOTE — Telephone Encounter (Signed)
error 

## 2019-03-16 NOTE — Progress Notes (Signed)
      [address]       [place] [address]             [phone]     4 Days Post-Op Procedure(s) (LRB): RIGHT XI ROBOTIC ASSISTED THORASCOPY-PLEURECTOMY and PLEURADESIS (Right) Video Bronchoscopy (N/A) Intercostal Nerve Block (Right) Subjective: Feels okay. She is on oxygen at home and wanted to make sure she will have oxygen for transport.   Objective: Vital signs in last 24 hours: Temp:  [98.1 F (36.7 C)-99.1 F (37.3 C)] 98.1 F (36.7 C) (02/20 0308) Pulse Rate:  [66-79] 67 (02/20 0308) Cardiac Rhythm: Normal sinus rhythm (02/20 0701) Resp:  [15-27] 15 (02/20 0308) BP: (119-156)/(72-84) 126/80 (02/20 0308) SpO2:  [99 %-100 %] 100 % (02/20 0308) Weight:  [58.9 kg] 58.9 kg (02/20 0448)     Intake/Output from previous day: 02/19 0701 - 02/20 0700 In: 490 [P.O.:490] Out: -  Intake/Output this shift: No intake/output data recorded.  General appearance: alert, cooperative and no distress Heart: regular rate and rhythm, S1, S2 normal, no murmur, click, rub or gallop Lungs: clear to auscultation bilaterally Abdomen: soft, non-tender; bowel sounds normal; no masses,  no organomegaly Extremities: extremities normal, atraumatic, no cyanosis or edema Wound: clean and dry  Lab Results: No results for input(s): WBC, HGB, HCT, PLT in the last 72 hours. BMET: No results for input(s): NA, K, CL, CO2, GLUCOSE, BUN, CREATININE, CALCIUM in the last 72 hours.  PT/INR: No results for input(s): LABPROT, INR in the last 72 hours. ABG No results found for: PHART, HCO3, TCO2, ACIDBASEDEF, O2SAT CBG (last 3)  No results for input(s): GLUCAP in the last 72 hours.  Assessment/Plan: S/P Procedure(s) (LRB): RIGHT XI ROBOTIC ASSISTED THORASCOPY-PLEURECTOMY and PLEURADESIS (Right) Video Bronchoscopy (N/A) Intercostal Nerve Block (Right)  1. CV - SB at times, first degree heart block . On HCTZ 12.5 mg daily as taken prior to surgery. Will be followed by cardiology  after discharge for work up for cardiac sarcoidosis 2.  Pulmonary - chest tube removed and CXR stable without pneumothorax. 3. Labs have been stable 4. She has a follow-up appointment with Dr. Servando Snare and she wishes to talk about a return to work form at this time.  5. Appreciate case management assisting with home oxygen   Plan: discharge today per CCM.    LOS: 10 days    Elgie Collard 03/16/2019

## 2019-03-16 NOTE — Progress Notes (Signed)
SpO2 96-100% at rest with O2 1L. SpO2 94 -97% at RA while rest then desaturated while ambulation 83%, but went back up to higher than 90% within 5 sec. Explained patient that she doesn't need O2 while resting. She might need lower O2 1-2L while activities. Patient understood it well. HS [employer]

## 2019-03-16 NOTE — Care Management (Signed)
Spoke w Ray of [facility], Oxygen provider. He is meeting someone at the house at 11 to service concentrator, then bringing O2 for transport to the room. Updated daughter Andee Poles. Per daughter the patient's spouse will provide transport home.

## 2019-03-16 NOTE — Discharge Summary (Signed)
Physician Discharge Summary  Patient ID: KARALYNE DUHON MRN: [id number] DOB/AGE: 07/22/1955 64 y.o.  Admit date: 03/06/2019 Discharge date: 03/16/2019  Admission Diagnoses: Spontaneous Pneumothorax - right  Discharge Diagnoses:  Active Problems:   Pneumothorax on right Stage IV Sarcoidosis - with multisystem involvement  Discharged Condition: good  Hospital Course:  Ms. Zeng is a 64 year old female with PMH significant for chronic fibrotic sarcoidosis with multisystem involvement including lungs, skin, and eyes. Diagnosed via bronchoscopy in 2000. She is followed by Dr. Annamaria Boots. She was seen in the pulmonary clinic on 2/5 with no PTX on film.  She presented again to the pulmonary clinic with complaints of dyspnea on 2/10 and was noted to have decreased breath sounds on the R and hypoxia with O2 sats 70% on room air. CXR done in clinic showed large R sided PTX and she was sent to ED. Upon arrival to ED PCCM was called to admission and chest tube placement.  Resolution of the pneumothorax with chest tube placement.  However given her severe cystic lung disease recommend sarcoidosis, and recurrent right-sided lung collapse, CT surgery was consulted for VATS and pleurodesis occurred on 2/16.  Postoperative course was uncomplicated.  She will be discharged follow-up with CT surgery as well as pulmonary.  We will continue her methotrexate and folic acid for her systemic sarcoidosis.  On the day of discharge she was tolerating her normal diet and denied pain. She had return of bowel function and was ambulating independently. She was set up with home oxygen on discharge as well.   Consults: CT Surgery  Discharge Exam: Blood pressure 139/80, pulse 94, temperature 98.3 F (36.8 C), temperature source Oral, resp. rate 17, height 5\' 2"  (1.575 m), weight 58.9 kg, SpO2 100 %. General appearance: alert, cooperative and no distress Head: Normocephalic, without obvious abnormality, atraumatic Nose: Nares  normal. Septum midline. Mucosa normal. No drainage or sinus tenderness., some dryness in nares Back: symmetric, no curvature. ROM normal. No CVA tenderness., right VATS incisions are CDI - no bleeding or drainage Resp: symmetric chest wall excursion, few diffuse crackles, no wheezes, no increased wob Chest wall: no tenderness GI: soft, non-tender; bowel sounds normal; no masses,  no organomegaly Extremities: extremities normal, atraumatic, no cyanosis or edema Pulses: 2+ and symmetric Skin: Skin color, texture, turgor normal. No rashes or lesions  Disposition:  There are no questions and answers to display.         Allergies as of 03/16/2019   No Known Allergies     Medication List    TAKE these medications   albuterol 108 (90 Base) MCG/ACT inhaler Commonly known as: VENTOLIN HFA Inhale 2 puffs into the lungs every 6 (six) hours as needed for wheezing or shortness of breath.   folic acid 1 MG tablet Commonly known as: FOLVITE Take 1 mg by mouth daily.   hydrochlorothiazide 12.5 MG capsule Commonly known as: MICROZIDE Take 12.5 mg by mouth daily.   methotrexate 2.5 MG tablet Commonly known as: RHEUMATREX Take 12.5 mg by mouth once a week. Caution:Chemotherapy. Protect from light.  Pt takes 5 tablets every Wednesday.   oxyCODONE 5 MG immediate release tablet Commonly known as: Oxy IR/ROXICODONE Take 1 tablet (5 mg total) by mouth every 6 (six) hours as needed for up to 7 days for severe pain.   prednisoLONE acetate 1 % ophthalmic suspension Commonly known as: PRED FORTE Place 1 drop into the right eye 2 (two) times daily. Reported on 04/16/2015 What changed: when to  take this      Follow-up Information    Grace Isaac, MD. Go on 03/28/2019.   Specialty: Cardiothoracic Surgery Why: PA/LAT CXR to be taken (at [facility] which is in the same building as Dr. Everrett Coombe office) on 03/04 at 1:00 pm;Appointment time is at 1:30 pm Contact information: [place] [address] [phone]        Deneise Lever, MD. Go on 04/18/2019.   Specialty: Pulmonary Disease Why: 1:30 PM - appt has been made Contact information: [place] [address] [phone]          Total time spent coordinating discharge 31 minutes   Signed: Spero Geralds 03/16/2019, 8:51 AM

## 2019-03-16 NOTE — Progress Notes (Signed)
Removed PIV access x 1 and received discharge instructions. Patient understood it well. Pt's husband brought the oxygen tank. Explained patient she does need with activities. He understood it well. HS [employer]

## 2019-03-18 ENCOUNTER — Telehealth: Payer: Self-pay | Admitting: *Deleted

## 2019-03-18 NOTE — Telephone Encounter (Signed)
Pt was on TCM report admitted 03/05/18 for chest tube placement.  Resolution of the pneumothorax with chest tube placement. Prior to admission pt was at her pulmonologist  complaints of dyspnea, and was noted to have decreased breath sounds on the R and hypoxia with O2 sats 70% on room air. CXR done in clinic showed large R sided PTX and she was sent to ED. Given her severe cystic lung disease recommend sarcoidosis, and recurrent right-sided lung collapse, CT surgery was consulted for VATS and pleurodesis occurred on 2/16.  Postoperative course was uncomplicated. Pt D./C 03/16/19, and will follow-up w/ Cardiothoracic Surgeon Grace Isaac, MD. Go on 03/28/2019, and will see Dr. Annamaria Boots 04/18/19...Johny Chess

## 2019-03-26 ENCOUNTER — Telehealth: Payer: Self-pay | Admitting: Cardiology

## 2019-03-26 ENCOUNTER — Encounter: Payer: Self-pay | Admitting: Cardiology

## 2019-03-26 NOTE — Telephone Encounter (Signed)
Left message for patient regarding appointment for Cardiac MRI scheduled Friday 04/05/19 at 9:00 am---arrival time is 8:15 am 1st floor radiology department at [facility].  Will mail information to patient

## 2019-03-27 ENCOUNTER — Other Ambulatory Visit: Payer: Self-pay | Admitting: Cardiothoracic Surgery

## 2019-03-27 DIAGNOSIS — J939 Pneumothorax, unspecified: Secondary | ICD-10-CM

## 2019-03-28 ENCOUNTER — Encounter: Payer: Self-pay | Admitting: Cardiothoracic Surgery

## 2019-03-28 ENCOUNTER — Ambulatory Visit
Admission: RE | Admit: 2019-03-28 | Discharge: 2019-03-28 | Disposition: A | Discharge status: Home / Self Care | Payer: 59 | Source: Ambulatory Visit | Attending: Cardiothoracic Surgery | Admitting: Cardiothoracic Surgery

## 2019-03-28 ENCOUNTER — Ambulatory Visit (INDEPENDENT_AMBULATORY_CARE_PROVIDER_SITE_OTHER): Payer: Self-pay | Admitting: Cardiothoracic Surgery

## 2019-03-28 ENCOUNTER — Other Ambulatory Visit: Payer: Self-pay

## 2019-03-28 VITALS — BP 135/83 | HR 80 | Temp 98.7°F | Resp 24 | Ht 62.0 in | Wt 132.0 lb

## 2019-03-28 DIAGNOSIS — J939 Pneumothorax, unspecified: Secondary | ICD-10-CM

## 2019-03-28 NOTE — Progress Notes (Signed)
[address]       [place] [address]             [phone]      Lauren Massey [place] Medical Record [id number] Date of Birth: [birth year]  Referring: Deneise Lever, MD Primary Care: Hoyt Koch, MD Primary Cardiologist: Donato Heinz, MD   Chief Complaint:   POST OP FOLLOW UP OPERATIVE REPORT DATE OF PROCEDURE:  03/12/2019 PREOPERATIVE DIAGNOSIS:  Recurrent right pneumothorax with underlying severe pulmonary sarcoidosis. POSTOPERATIVE DIAGNOSIS:  Recurrent right pneumothorax with underlying severe pulmonary sarcoidosis. SURGICAL PROCEDURES:   1.  Right robotic-assisted thoracoscopy.  2.  Pleurectomy.  3.  Pleurodesis. 4.  Video bronchoscopy. SURGEON:  Lanelle Bal, MD  History of Present Illness:     Patient returns to the office today in follow-up after recent readmission for recurrent pneumothorax, at the time of admission robotic assisted pleurectomy and pleurodesis was performed.  The patient has known severe underlying sarcoidosis.  Since discharge home she has been doing well, preop she had been on oxygen at home.  Currently she is using oxygen at night.  She has had no increasing symptoms of dyspnea chest discomfort or shortness of breath.    Past Medical History:  Diagnosis Date  . Cataract    right eye  . Sarcoidosis    skin, eye, lung  . Unspecified iridocyclitis      Social History   Tobacco Use  Smoking Status Never Smoker  Smokeless Tobacco Never Used    Social History   Substance and Sexual Activity  Alcohol Use No     No Known Allergies  Current Outpatient Medications  Medication Sig Dispense Refill  . albuterol (VENTOLIN HFA) 108 (90 Base) MCG/ACT inhaler Inhale 2 puffs into the lungs every 6 (six) hours as needed for wheezing or shortness of breath. 18 g 12  . folic acid (FOLVITE) 1 MG tablet Take 1 mg by mouth daily.    . hydrochlorothiazide (MICROZIDE) 12.5 MG capsule Take 12.5  mg by mouth daily.    . methotrexate (RHEUMATREX) 2.5 MG tablet Take 12.5 mg by mouth once a week. Caution:Chemotherapy. Protect from light.  Pt takes 5 tablets every Wednesday.    . prednisoLONE acetate (PRED FORTE) 1 % ophthalmic suspension Place 1 drop into the right eye 2 (two) times daily. Reported on 04/16/2015 (Patient taking differently: Place 1 drop into the right eye 4 (four) times daily. Reported on 04/16/2015) 5 mL 1   No current facility-administered medications for this visit.       Physical Exam: BP 135/83 (BP Location: Right Arm)   Pulse 80   Temp 98.7 F (37.1 C) (Oral)   Resp (!) 24   Ht 5\' 2"  (1.575 m)   Wt 132 lb (59.9 kg)   SpO2 93% Comment: RA  BMI 24.14 kg/m   General appearance: alert and cooperative Neurologic: intact Heart: regular rate and rhythm, S1, S2 normal, no murmur, click, rub or gallop Lungs: clear to auscultation bilaterally Abdomen: soft, non-tender; bowel sounds normal; no masses,  no organomegaly Extremities: extremities normal, atraumatic, no cyanosis or edema and Homans sign is negative, no sign of DVT Wound: Port sites are all well-healed   Diagnostic Studies & Laboratory data:     Recent Radiology Findings:   DG Chest 2 View  Result Date: 03/28/2019 CLINICAL DATA:  Follow-up pneumothorax EXAM: CHEST - 2 VIEW COMPARISON:  03/16/2019 FINDINGS: Stable cardiomediastinal silhouette. Calcific aortic knob.  Small to moderate right-sided pneumothorax, new from prior (approximately 15-20% volume of the right hemithorax). No left-sided pneumothorax. Diffuse patchy opacities throughout both lungs, progressed from prior. There is a background of interstitial lung disease related to history of sarcoidosis. IMPRESSION: 1. Small to moderate right-sided pneumothorax, new from prior. 2. Worsening diffuse patchy opacities throughout both lungs. These results will be called to the ordering clinician or representative by the Radiologist Assistant, and  communication documented in the PACS or zVision Dashboard. Electronically Signed   By: Davina Poke D.O.   On: 03/28/2019 13:29    I have independently reviewed the above radiology studies  and reviewed the findings with the patient.    Recent Lab Findings: Lab Results  Component Value Date   WBC 6.2 03/13/2019   HGB 12.1 03/13/2019   HCT 40.8 03/13/2019   PLT 202 03/13/2019   GLUCOSE 109 (H) 03/13/2019   CHOL 233 (A) 11/17/2018   TRIG 106 11/17/2018   HDL 77 (A) 11/17/2018   LDLCALC 18 11/17/2018   ALT 35 03/12/2019   AST 44 (H) 03/12/2019   NA 135 03/13/2019   K 4.5 03/13/2019   CL 94 (L) 03/13/2019   CREATININE 0.67 03/13/2019   BUN 14 03/13/2019   CO2 31 03/13/2019   TSH 1.87 08/03/2015   INR 1.0 03/11/2019      Assessment / Plan:   Patient is postop from recent robotic pleurectomy pleurodesis for recurrent pneumothoraces on the right.  Chest x-ray shows small loculated lateral pneumothorax-asymptomatic.  The apical portion of the lung appears adherent to the chest wall following pleurectomy.  At this point will have the patient return in 1 week with a follow-up chest x-ray.   Medication Changes: No orders of the defined types were placed in this encounter.     Grace Isaac MD      [address] [place] [address] Office [phone]     03/28/2019 2:10 PM

## 2019-04-03 ENCOUNTER — Other Ambulatory Visit: Payer: Self-pay | Admitting: Cardiothoracic Surgery

## 2019-04-03 DIAGNOSIS — J9383 Other pneumothorax: Secondary | ICD-10-CM

## 2019-04-03 NOTE — Progress Notes (Signed)
[address]       [place] [address]             [phone]      Lauren Massey [place] Medical Record [id number] Date of Birth: [birth year]  Referring: Deneise Lever, MD Primary Care: Hoyt Koch, MD Primary Cardiologist: Donato Heinz, MD   Chief Complaint:   POST OP FOLLOW UP OPERATIVE REPORT DATE OF PROCEDURE:  03/12/2019 PREOPERATIVE DIAGNOSIS:  Recurrent right pneumothorax with underlying severe pulmonary sarcoidosis. POSTOPERATIVE DIAGNOSIS:  Recurrent right pneumothorax with underlying severe pulmonary sarcoidosis. SURGICAL PROCEDURES:   1.  Right robotic-assisted thoracoscopy.  2.  Pleurectomy.  3.  Pleurodesis. 4.  Video bronchoscopy. SURGEON:  Lanelle Bal, MD  History of Present Illness:     Patient returns to the office today in follow-up after recent readmission for recurrent pneumothorax, at the time of admission robotic assisted pleurectomy and pleurodesis was performed.  The patient has known severe underlying sarcoidosis.   Patient returns to the office today with follow-up chest x-ray  Since seen last week she is continued to improve, and is now driving herself around, is able to get around the house without significant difficulty   Past Medical History:  Diagnosis Date  . Cataract    right eye  . Sarcoidosis    skin, eye, lung  . Unspecified iridocyclitis      Social History   Tobacco Use  Smoking Status Never Smoker  Smokeless Tobacco Never Used    Social History   Substance and Sexual Activity  Alcohol Use No     No Known Allergies  Current Outpatient Medications  Medication Sig Dispense Refill  . albuterol (VENTOLIN HFA) 108 (90 Base) MCG/ACT inhaler Inhale 2 puffs into the lungs every 6 (six) hours as needed for wheezing or shortness of breath. 18 g 12  . folic acid (FOLVITE) 1 MG tablet Take 1 mg by mouth daily.    . hydrochlorothiazide (MICROZIDE) 12.5 MG capsule Take 12.5  mg by mouth daily.    . methotrexate (RHEUMATREX) 2.5 MG tablet Take 12.5 mg by mouth once a week. Caution:Chemotherapy. Protect from light.  Pt takes 5 tablets every Wednesday.    . prednisoLONE acetate (PRED FORTE) 1 % ophthalmic suspension Place 1 drop into the right eye 2 (two) times daily. Reported on 04/16/2015 (Patient taking differently: Place 1 drop into the right eye 4 (four) times daily. Reported on 04/16/2015) 5 mL 1   No current facility-administered medications for this visit.       Physical Exam: BP (!) 160/86   Pulse 74   Temp (!) 96.9 F (36.1 C) (Skin)   Resp 20   Ht 5\' 2"  (1.575 m)   Wt 132 lb (59.9 kg)   SpO2 94% Comment: RA  BMI 24.14 kg/m   General appearance: alert, cooperative and no distress Neck: no adenopathy, no carotid bruit, no JVD, supple, symmetrical, trachea midline and thyroid not enlarged, symmetric, no tenderness/mass/nodules Resp: clear to auscultation bilaterally Cardio: regular rate and rhythm, S1, S2 normal, no murmur, click, rub or gallop Extremities: extremities normal, atraumatic, no cyanosis or edema Neurologic: Grossly normal    Diagnostic Studies & Laboratory data:     Recent Radiology Findings:   DG Chest 2 View  Result Date: 04/04/2019 CLINICAL DATA:  Spontaneous pneumothorax. History of sarcoidosis. EXAM: CHEST - 2 VIEW COMPARISON:  Chest radiograph 03/28/2019. chest CT 03/07/2019 FINDINGS: Small residual right pneumothorax, decreased in size  from prior exam. No midline shift. Extensive pulmonary scarring including cystic changes at the bases, as seen on prior CT. No new airspace disease. Normal heart size with unchanged mediastinal contours, hilar prominence and retraction related to known adenopathy and sarcoidosis. Stable osseous structures. IMPRESSION: 1. Small residual right pneumothorax, decreased in size from radiograph last week. 2. Otherwise unchanged exam. Stable pulmonary scarring and sequela of sarcoidosis.  Electronically Signed   By: Keith Rake M.D.   On: 04/04/2019 13:51    I have independently reviewed the above radiology studies  and reviewed the findings with the patient.    Recent Lab Findings: Lab Results  Component Value Date   WBC 6.2 03/13/2019   HGB 12.1 03/13/2019   HCT 40.8 03/13/2019   PLT 202 03/13/2019   GLUCOSE 109 (H) 03/13/2019   CHOL 233 (A) 11/17/2018   TRIG 106 11/17/2018   HDL 77 (A) 11/17/2018   LDLCALC 18 11/17/2018   ALT 35 03/12/2019   AST 44 (H) 03/12/2019   NA 135 03/13/2019   K 4.5 03/13/2019   CL 94 (L) 03/13/2019   CREATININE 0.67 03/13/2019   BUN 14 03/13/2019   CO2 31 03/13/2019   TSH 1.87 08/03/2015   INR 1.0 03/11/2019      Assessment / Plan:   Patient is postop from recent robotic pleurectomy pleurodesis for recurrent pneumothoraces on the right.  -Chest x-ray today is improved with very small residual pneumothorax  Patient patient will continue to increase her activity  We discussed returning to work half a day for a week April 5 with no lifting over 20 to 25 pounds In regard to the evaluation for possible cardiac sarcoid the patient is to have a cardiac MRI tomorrow  She has a follow-up appointment with pulmonary in 2 weeks I will plan to see her back in 4 weeks with a follow-up chest x-ray   Medication Changes: No orders of the defined types were placed in this encounter.     Grace Isaac MD      [address] [place] [address] Office [phone]     04/04/2019 2:25 PM

## 2019-04-04 ENCOUNTER — Ambulatory Visit
Admission: RE | Admit: 2019-04-04 | Discharge: 2019-04-04 | Disposition: A | Discharge status: Home / Self Care | Payer: 59 | Source: Ambulatory Visit | Attending: Cardiothoracic Surgery | Admitting: Cardiothoracic Surgery

## 2019-04-04 ENCOUNTER — Ambulatory Visit (INDEPENDENT_AMBULATORY_CARE_PROVIDER_SITE_OTHER): Payer: Self-pay | Admitting: Cardiothoracic Surgery

## 2019-04-04 ENCOUNTER — Other Ambulatory Visit: Payer: Self-pay

## 2019-04-04 ENCOUNTER — Telehealth (HOSPITAL_COMMUNITY): Payer: Self-pay | Admitting: Emergency Medicine

## 2019-04-04 VITALS — BP 160/86 | HR 74 | Temp 96.9°F | Resp 20 | Ht 62.0 in | Wt 132.0 lb

## 2019-04-04 DIAGNOSIS — D860 Sarcoidosis of lung: Secondary | ICD-10-CM

## 2019-04-04 DIAGNOSIS — J9383 Other pneumothorax: Secondary | ICD-10-CM

## 2019-04-04 NOTE — Telephone Encounter (Signed)
Reaching out to patient to offer assistance regarding upcoming cardiac imaging study; pt verbalizes understanding of appt date/time, parking situation and where to check in,and verified current allergies; name and call back number provided for further questions should they arise Lauren Bond RN Navigator Cardiac Imaging [facility] [phone] office [phone] cell  Pt denies implants other than dentures, denies claustro

## 2019-04-05 ENCOUNTER — Ambulatory Visit (HOSPITAL_COMMUNITY)
Admission: RE | Admit: 2019-04-05 | Discharge: 2019-04-05 | Disposition: A | Discharge status: Home / Self Care | Payer: 59 | Source: Ambulatory Visit | Attending: Physician Assistant | Admitting: Physician Assistant

## 2019-04-05 DIAGNOSIS — D8685 Sarcoid myocarditis: Secondary | ICD-10-CM

## 2019-04-05 DIAGNOSIS — D8689 Sarcoidosis of other sites: Secondary | ICD-10-CM | POA: Insufficient documentation

## 2019-04-05 MED ORDER — GADOBUTROL 1 MMOL/ML IV SOLN
8.0000 mL | Freq: Once | INTRAVENOUS | Status: AC | PRN
  Administered 2019-04-05: 8 mL via INTRAVENOUS

## 2019-04-07 NOTE — Progress Notes (Signed)
Cardiology Office Note:    Date:  04/09/2019   ID:  JASHIYA SCRIBNER, DOB [birth year], MRN [id number]  PCP:  Hoyt Koch, MD  Cardiologist:  Donato Heinz, MD  Electrophysiologist:  None   Referring MD: Hoyt Koch, *   Chief Complaint  Patient presents with  . Shortness of Breath    History of Present Illness:    COSETTA QU is a 64 y.o. female with a hx of pulmonary sarcoidosis, hypertension who is being seen today for hospital follow-up.  She was admitted to [facility] for VATS/robotic pleurectomy due to recurrent pneumothoraces.  Cardiology was consulted for preop evaluation and consideration of cardiac sarcoid eval.  TTE showed normal LV systolic function, normal RV function, mild PASP elevation.  No further cardiac work-up was recommended prior to surgery.  Outpatient follow-up for cardiac MRI was recommended.  She tolerated her procedure well, postoperative course was uncomplicated.  Cardiac MRI was done on 04/05/2019, which showed normal LV systolic function (EF 0000000), normal RV systolic function (EF [id number]).  Inferior RV insertion site LGE consistent with elevated pulmonary pressures, no evidence of cardiac sarcoid.  Patient currently reports that she is doing well.  States that she has intermittent sharp chest pain, last few seconds and resolves.  Reports dyspnea has been improving.  Currently using oxygen at night.  BP well controlled.   Past Medical History:  Diagnosis Date  . Cataract    right eye  . Sarcoidosis    skin, eye, lung  . Unspecified iridocyclitis     Past Surgical History:  Procedure Laterality Date  . ABDOMINAL HYSTERECTOMY    . APPENDECTOMY    . BRONCHOSCOPY  2003  . CATARACT EXTRACTION    . INTERCOSTAL NERVE BLOCK Right 03/12/2019   Procedure: Intercostal Nerve Block;  Surgeon: Grace Isaac, MD;  Location: [place];  Service: Thoracic;  Laterality: Right;  Marland Kitchen VESICOVAGINAL FISTULA CLOSURE W/ TAH    . VIDEO BRONCHOSCOPY N/A  03/12/2019   Procedure: Video Bronchoscopy;  Surgeon: Grace Isaac, MD;  Location: [facility];  Service: Thoracic;  Laterality: N/A;    Current Medications: Current Meds  Medication Sig  . albuterol (VENTOLIN HFA) 108 (90 Base) MCG/ACT inhaler Inhale 2 puffs into the lungs every 6 (six) hours as needed for wheezing or shortness of breath.  . folic acid (FOLVITE) 1 MG tablet Take 1 mg by mouth daily.  . hydrochlorothiazide (MICROZIDE) 12.5 MG capsule Take 12.5 mg by mouth daily.  . methotrexate (RHEUMATREX) 2.5 MG tablet Take 12.5 mg by mouth once a week. Caution:Chemotherapy. Protect from light.  Pt takes 5 tablets every Wednesday.  . prednisoLONE acetate (PRED FORTE) 1 % ophthalmic suspension Place 1 drop into the right eye 2 (two) times daily. Reported on 04/16/2015 (Patient taking differently: Place 1 drop into the right eye 4 (four) times daily. Reported on 04/16/2015)     Allergies:   Patient has no known allergies.   Social History   Socioeconomic History  . Marital status: Married    Spouse name: Not on file  . Number of children: 3  . Years of education: Not on file  . Highest education level: Not on file  Occupational History  . 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 Smoking status: Never Smoker  . Smokeless tobacco: Never Used  Substance and Sexual Activity  . Alcohol use: No  . Drug use: No  . Sexual activity: Not on  file  Other Topics Concern  . Not on file  Social History Narrative  . Not on file   Social Determinants of Health   Financial Resource Strain:   . Difficulty of Paying Living Expenses:   Food Insecurity:   . Worried About Charity fundraiser in the Last Year:   . Arboriculturist in the Last Year:   Transportation Needs:   . Film/video editor (Medical):   Marland Kitchen Lack of Transportation (Non-Medical):   Physical Activity:   . Days of Exercise per Week:   . Minutes of Exercise per Session:   Stress:   . Feeling  of Stress :   Social Connections:   . Frequency of Communication with Friends and Family:   . Frequency of Social Gatherings with Friends and Family:   . Attends Religious Services:   . Active Member of Clubs or Organizations:   . Attends Archivist Meetings:   Marland Kitchen Marital Status:      Family History: The patient's family history includes Cancer in her father; Diabetes in an other family member; Heart disease in her father. There is no history of Colon cancer.  ROS:   Please see the history of present illness.     All other systems reviewed and are negative.  EKGs/Labs/Other Studies Reviewed:    The following studies were reviewed today:   EKG:  EKG is not ordered today  Recent Labs: 03/08/2019: B Natriuretic Peptide 121.4 03/12/2019: ALT 35; Magnesium 2.0 03/13/2019: BUN 14; Creatinine, Ser 0.67; Hemoglobin 12.1; Platelets 202; Potassium 4.5; Sodium 135  Recent Lipid Panel    Component Value Date/Time   CHOL 233 (A) 11/17/2018 0000   TRIG 106 11/17/2018 0000   HDL 77 (A) 11/17/2018 0000   CHOLHDL 4 02/05/2018 1010   VLDL 18.2 02/05/2018 1010   LDLCALC 18 11/17/2018 0000   TTE 03/08/19: 1. Left ventricular ejection fraction, by estimation, is 60 to 65%. The  left ventricle has normal function. The left ventricle has no regional  wall motion abnormalities. Left ventricular diastolic parameters were  normal.  2. Right ventricular systolic function is normal. The right ventricular  size is normal. There is mildly elevated pulmonary artery systolic  pressure.  3. The mitral valve is normal in structure and function. Trivial mitral  valve regurgitation. No evidence of mitral stenosis.  4. The aortic valve is tricuspid. Aortic valve regurgitation is not  visualized. Mild aortic valve sclerosis is present, with no evidence of  aortic valve stenosis.   CMR 04/07/19: 1. Normal biventricular chamber size and systolic function. LVEF 68%, RVEF 53%.  2. Possible  small focus of late gadolinium enhancement in the left ventricular myocardium at the inferior RV insertion point at the mid ventricle. This finding can be seen with increased pulmonary pressures.  3.  No definite findings suggestive of cardiac sarcoidosis.   Physical Exam:    VS:  BP 116/70   Pulse 71   Temp (!) 97.3 F (36.3 C)   Ht 5\' 2"  (1.575 m)   Wt 137 lb (62.1 kg)   SpO2 96%   BMI 25.06 kg/m     Wt Readings from Last 3 Encounters:  04/09/19 137 lb (62.1 kg)  04/04/19 132 lb (59.9 kg)  03/28/19 132 lb (59.9 kg)     GEN:  in no acute distress HEENT: Normal NECK: No JVD CARDIAC: RRR, no murmurs, rubs, gallops RESPIRATORY:  Clear to auscultation without rales, wheezing or rhonchi  ABDOMEN:  Soft, non-tender, non-distended MUSCULOSKELETAL:  No edema; No deformity  SKIN: Warm and dry NEUROLOGIC:  Alert and oriented x 3 PSYCHIATRIC:  Normal affect   ASSESSMENT:    1. Sarcoidosis   2. Essential hypertension   3. Chest pain of uncertain etiology    PLAN:     Sarcoidosis: Known pulmonary sarcoid, cardiac MRI 04/05/19 shows no evidence of cardiac sarcoid.  Chest pain: Description suggest noncardiac chest pain, as describes sharp pain lasting few seconds and resolves.  No further cardiac work-up recommended at this time  Hypertension: On hydrochlorothiazide 12.5 mg daily.  Appears controlled  RTC in 1 year  Medication Adjustments/Labs and Tests Ordered: Current medicines are reviewed at length with the patient today.  Concerns regarding medicines are outlined above.  No orders of the defined types were placed in this encounter.  No orders of the defined types were placed in this encounter.   Patient Instructions  Medication Instructions:  Your physician recommends that you continue on your current medications as directed. Please refer to the Current Medication list given to you today.  *If you need a refill on your cardiac medications before your next  appointment, please call your pharmacy*  Lab Work: NONE  Testing/Procedures: NONE  Follow-Up: At [facility], you and your health needs are our priority.  As part of our continuing mission to provide you with exceptional heart care, we have created designated Provider Care Teams.  These Care Teams include your primary Cardiologist (physician) and Advanced Practice Providers (APPs -  Physician Assistants and Nurse Practitioners) who all work together to provide you with the care you need, when you need it.  We recommend signing up for the patient portal called "MyChart".  Sign up information is provided on this After Visit Summary.  MyChart is used to connect with patients for Virtual Visits (Telemedicine).  Patients are able to view lab/test results, encounter notes, upcoming appointments, etc.  Non-urgent messages can be sent to your provider as well.   To learn more about what you can do with MyChart, go to [email or web address].    Your next appointment:   12 month(s)  The format for your next appointment:   In Person  Provider:   Oswaldo Milian, MD       Signed, Donato Heinz, MD  04/09/2019 11:44 AM    [place]

## 2019-04-08 ENCOUNTER — Encounter: Payer: Self-pay | Admitting: Gastroenterology

## 2019-04-09 ENCOUNTER — Encounter: Payer: Self-pay | Admitting: Cardiology

## 2019-04-09 ENCOUNTER — Other Ambulatory Visit: Payer: Self-pay

## 2019-04-09 ENCOUNTER — Ambulatory Visit (INDEPENDENT_AMBULATORY_CARE_PROVIDER_SITE_OTHER): Payer: 59 | Admitting: Cardiology

## 2019-04-09 VITALS — BP 116/70 | HR 71 | Temp 97.3°F | Ht 62.0 in | Wt 137.0 lb

## 2019-04-09 DIAGNOSIS — D869 Sarcoidosis, unspecified: Secondary | ICD-10-CM

## 2019-04-09 DIAGNOSIS — I10 Essential (primary) hypertension: Secondary | ICD-10-CM | POA: Diagnosis not present

## 2019-04-09 DIAGNOSIS — R079 Chest pain, unspecified: Secondary | ICD-10-CM | POA: Diagnosis not present

## 2019-04-09 NOTE — Patient Instructions (Signed)
Medication Instructions:  Your physician recommends that you continue on your current medications as directed. Please refer to the Current Medication list given to you today.  *If you need a refill on your cardiac medications before your next appointment, please call your pharmacy*   Lab Work: NONE  Testing/Procedures: NONE  Follow-Up: At [facility], you and your health needs are our priority.  As part of our continuing mission to provide you with exceptional heart care, we have created designated Provider Care Teams.  These Care Teams include your primary Cardiologist (physician) and Advanced Practice Providers (APPs -  Physician Assistants and Nurse Practitioners) who all work together to provide you with the care you need, when you need it.  We recommend signing up for the patient portal called "MyChart".  Sign up information is provided on this After Visit Summary.  MyChart is used to connect with patients for Virtual Visits (Telemedicine).  Patients are able to view lab/test results, encounter notes, upcoming appointments, etc.  Non-urgent messages can be sent to your provider as well.   To learn more about what you can do with MyChart, go to [email or web address].    Your next appointment:   12 month(s)  The format for your next appointment:   In Person  Provider:   Christopher Schumann, MD      

## 2019-04-10 LAB — HM MAMMOGRAPHY

## 2019-04-11 ENCOUNTER — Ambulatory Visit: Payer: 59 | Admitting: Cardiothoracic Surgery

## 2019-04-12 ENCOUNTER — Other Ambulatory Visit: Payer: Self-pay

## 2019-04-12 ENCOUNTER — Ambulatory Visit (INDEPENDENT_AMBULATORY_CARE_PROVIDER_SITE_OTHER): Payer: 59 | Admitting: Internal Medicine

## 2019-04-12 ENCOUNTER — Encounter: Payer: Self-pay | Admitting: Internal Medicine

## 2019-04-12 VITALS — BP 136/78 | HR 69 | Temp 98.0°F | Ht 62.0 in | Wt 131.0 lb

## 2019-04-12 DIAGNOSIS — I10 Essential (primary) hypertension: Secondary | ICD-10-CM | POA: Diagnosis not present

## 2019-04-12 DIAGNOSIS — Z0000 Encounter for general adult medical examination without abnormal findings: Secondary | ICD-10-CM

## 2019-04-12 DIAGNOSIS — D869 Sarcoidosis, unspecified: Secondary | ICD-10-CM

## 2019-04-12 DIAGNOSIS — Z736 Limitation of activities due to disability: Secondary | ICD-10-CM

## 2019-04-12 MED ORDER — HYDROCHLOROTHIAZIDE 12.5 MG PO CAPS: 12.5000 mg | ORAL_CAPSULE | Freq: Every day | ORAL | 3 refills | Status: DC

## 2019-04-12 NOTE — Assessment & Plan Note (Signed)
Refill hctz 12.5 mg daily. BP initially elevated and then recheck normal. Likely due to recent lung surgery. Recent BMP normal.

## 2019-04-12 NOTE — Progress Notes (Signed)
   Subjective:   Patient ID: Lauren Massey, female    DOB: 1955/07/28, 64 y.o.   MRN: [id number]  HPI The patient is a 64 YO female coming in for physical. Recent VATS and recovering well, still not back at work, getting covid-19 vaccine first dose this weekend.  PMH, [facility], social history reviewed and updated  Review of Systems  Constitutional: Negative.   HENT: Negative.   Eyes: Negative.   Respiratory: Positive for shortness of breath. Negative for cough and chest tightness.   Cardiovascular: Negative for chest pain, palpitations and leg swelling.  Gastrointestinal: Negative for abdominal distention, abdominal pain, constipation, diarrhea, nausea and vomiting.  Musculoskeletal: Negative.   Skin: Negative.   Neurological: Negative.   Psychiatric/Behavioral: Negative.     Objective:  Physical Exam Constitutional:      Appearance: She is well-developed.  HENT:     Head: Normocephalic and atraumatic.  Cardiovascular:     Rate and Rhythm: Normal rate and regular rhythm.  Pulmonary:     Effort: Pulmonary effort is normal. No respiratory distress.     Breath sounds: Normal breath sounds. No wheezing or rales.  Abdominal:     General: Bowel sounds are normal. There is no distension.     Palpations: Abdomen is soft.     Tenderness: There is no abdominal tenderness. There is no rebound.  Musculoskeletal:     Cervical back: Normal range of motion.  Skin:    General: Skin is warm and dry.  Neurological:     Mental Status: She is alert and oriented to person, place, and time.     Coordination: Coordination normal.     Vitals:   04/12/19 1009 04/12/19 1037  BP: (!) 170/90 136/78  Pulse: 69   Temp: 98 F (36.7 C)   TempSrc: Oral   SpO2: 90%   Weight: 131 lb (59.4 kg)   Height: 5\' 2"  (1.575 m)     This visit occurred during the SARS-CoV-2 public health emergency.  Safety protocols were in place, including screening questions prior to the visit, additional usage of staff  PPE, and extensive cleaning of exam room while observing appropriate contact time as indicated for disinfecting solutions.   Assessment & Plan:

## 2019-04-12 NOTE — Assessment & Plan Note (Signed)
Following with pulmonary and s/p recent vats for recurrent pneumothorax.

## 2019-04-12 NOTE — Assessment & Plan Note (Signed)
Flu shot up to date. Shingrix counseled deferred due to upcoming covid-19. Tetanus up to date. Colonoscopy getting in the next month. Mammogram getting this week, pap smear up to date with gyn. Counseled about sun safety and mole surveillance. Counseled about the dangers of distracted driving. Given 10 year screening recommendations.

## 2019-04-12 NOTE — Patient Instructions (Signed)
Health Maintenance, Female Adopting a healthy lifestyle and getting preventive care are important in promoting health and wellness. Ask your health care provider about:  The right schedule for you to have regular tests and exams.  Things you can do on your own to prevent diseases and keep yourself healthy. What should I know about diet, weight, and exercise? Eat a healthy diet   Eat a diet that includes plenty of vegetables, fruits, low-fat dairy products, and lean protein.  Do not eat a lot of foods that are high in solid fats, added sugars, or sodium. Maintain a healthy weight Body mass index (BMI) is used to identify weight problems. It estimates body fat based on height and weight. Your health care provider can help determine your BMI and help you achieve or maintain a healthy weight. Get regular exercise Get regular exercise. This is one of the most important things you can do for your health. Most adults should:  Exercise for at least 150 minutes each week. The exercise should increase your heart rate and make you sweat (moderate-intensity exercise).  Do strengthening exercises at least twice a week. This is in addition to the moderate-intensity exercise.  Spend less time sitting. Even light physical activity can be beneficial. Watch cholesterol and blood lipids Have your blood tested for lipids and cholesterol at 65 years of age, then have this test every 5 years. Have your cholesterol levels checked more often if:  Your lipid or cholesterol levels are high.  You are older than 65 years of age.  You are at high risk for heart disease. What should I know about cancer screening? Depending on your health history and family history, you may need to have cancer screening at various ages. This may include screening for:  Breast cancer.  Cervical cancer.  Colorectal cancer.  Skin cancer.  Lung cancer. What should I know about heart disease, diabetes, and high blood  pressure? Blood pressure and heart disease  High blood pressure causes heart disease and increases the risk of stroke. This is more likely to develop in people who have high blood pressure readings, are of African descent, or are overweight.  Have your blood pressure checked: ? Every 3-5 years if you are 18-39 years of age. ? Every year if you are 40 years old or older. Diabetes Have regular diabetes screenings. This checks your fasting blood sugar level. Have the screening done:  Once every three years after age 40 if you are at a normal weight and have a low risk for diabetes.  More often and at a younger age if you are overweight or have a high risk for diabetes. What should I know about preventing infection? Hepatitis B If you have a higher risk for hepatitis B, you should be screened for this virus. Talk with your health care provider to find out if you are at risk for hepatitis B infection. Hepatitis C Testing is recommended for:  Everyone born from 1945 through 1965.  Anyone with known risk factors for hepatitis C. Sexually transmitted infections (STIs)  Get screened for STIs, including gonorrhea and chlamydia, if: ? You are sexually active and are younger than 65 years of age. ? You are older than 65 years of age and your health care provider tells you that you are at risk for this type of infection. ? Your sexual activity has changed since you were last screened, and you are at increased risk for chlamydia or gonorrhea. Ask your health care provider if   you are at risk.  Ask your health care provider about whether you are at high risk for HIV. Your health care provider may recommend a prescription medicine to help prevent HIV infection. If you choose to take medicine to prevent HIV, you should first get tested for HIV. You should then be tested every 3 months for as long as you are taking the medicine. Pregnancy  If you are about to stop having your period (premenopausal) and  you may become pregnant, seek counseling before you get pregnant.  Take 400 to 800 micrograms (mcg) of folic acid every day if you become pregnant.  Ask for birth control (contraception) if you want to prevent pregnancy. Osteoporosis and menopause Osteoporosis is a disease in which the bones lose minerals and strength with aging. This can result in bone fractures. If you are 65 years old or older, or if you are at risk for osteoporosis and fractures, ask your health care provider if you should:  Be screened for bone loss.  Take a calcium or vitamin D supplement to lower your risk of fractures.  Be given hormone replacement therapy (HRT) to treat symptoms of menopause. Follow these instructions at home: Lifestyle  Do not use any products that contain nicotine or tobacco, such as cigarettes, e-cigarettes, and chewing tobacco. If you need help quitting, ask your health care provider.  Do not use street drugs.  Do not share needles.  Ask your health care provider for help if you need support or information about quitting drugs. Alcohol use  Do not drink alcohol if: ? Your health care provider tells you not to drink. ? You are pregnant, may be pregnant, or are planning to become pregnant.  If you drink alcohol: ? Limit how much you use to 0-1 drink a day. ? Limit intake if you are breastfeeding.  Be aware of how much alcohol is in your drink. In the [place] one drink equals one 12 oz bottle of beer (355 mL), one 5 oz glass of wine (148 mL), or one 1 oz glass of hard liquor (44 mL). General instructions  Schedule regular health, dental, and eye exams.  Stay current with your vaccines.  Tell your health care provider if: ? You often feel depressed. ? You have ever been abused or do not feel safe at home. Summary  Adopting a healthy lifestyle and getting preventive care are important in promoting health and wellness.  Follow your health care provider's instructions about healthy  diet, exercising, and getting tested or screened for diseases.  Follow your health care provider's instructions on monitoring your cholesterol and blood pressure. This information is not intended to replace advice given to you by your health care provider. Make sure you discuss any questions you have with your health care provider. Document Revised: 01/03/2018 Document Reviewed: 01/03/2018 Elsevier Patient Education  2020 [employer]  

## 2019-04-17 ENCOUNTER — Telehealth: Payer: Self-pay | Admitting: Physician Assistant

## 2019-04-17 NOTE — Telephone Encounter (Signed)
-----   Message from Lonn Georgia, PA-C sent at 04/13/2019  4:13 PM EDT ----- Please let her know that her heart muscle function was good.  She may have had some increased pulmonary pressures, but nothing suggested cardiac sarcoid.  This is good news.Thanks

## 2019-04-17 NOTE — Telephone Encounter (Signed)
Results given to pt. Pt verbalized understanding.

## 2019-04-18 ENCOUNTER — Ambulatory Visit (INDEPENDENT_AMBULATORY_CARE_PROVIDER_SITE_OTHER): Payer: 59 | Admitting: Internal Medicine

## 2019-04-18 ENCOUNTER — Other Ambulatory Visit: Payer: Self-pay

## 2019-04-18 ENCOUNTER — Encounter: Payer: Self-pay | Admitting: Internal Medicine

## 2019-04-18 DIAGNOSIS — D869 Sarcoidosis, unspecified: Secondary | ICD-10-CM

## 2019-04-18 DIAGNOSIS — J9611 Chronic respiratory failure with hypoxia: Secondary | ICD-10-CM | POA: Diagnosis not present

## 2019-04-18 NOTE — Progress Notes (Signed)
Patient ID: Lauren Massey, female    DOB: 07/23/1955, 64 y.o.   MRN: [id number]  HPI  Female never smoker followed for Sarcoid III involving eyes/iritis, lungs and skin-bronchoscopy 2000. Failed Plaquenil for skin rash. ACE 02/19/15- elevated 97, down from >100 07/2014.  ACE 05/12/16-   50 (9-67) Office Spirometry 08/06/2015-moderate restriction. FVC 1.53/63%, FEV1 1.19/62%, FEV1/FVC 0.78 Room air saturation at rest on arrival 03/14/16- 88%, desaturating to 85% with ambulation and improving to 98% ambulating on 2 L. PFT 03/16/17-moderately severe restriction, severe reduction of diffusion.  FVC 1.62/66%, FEV1 1.46/76%, ratio 0.90, TLC 50%, DLCO 26% -------------------------------------------------------------------------------------------------------   01/31/2018-  64 year old female never smoker followed for Sarcoid III involving eyes/iritis, lungs,rectum, skin-bronchoscopy 2000, chronic hypoxic respiratory failure O2 2 L / APS MTX, , Proventil HFA, Trelegy, No acute exacerbation.  She keeps some chronic dry cough and feels it takes her longer than average to get over colds.  Has never cleared chronic skin lesions on her back attributed to sarcoid.  Notices some irritation on the dorsum of right hand in the last few days blamed on using bleach and dishwashing fluid.  No adenopathy, night sweats or chest pain.  02/08/19- 64 year old female never smoker followed for Sarcoid III involving eyes/iritis, lungs,rectum, skin-bronchoscopy 2000, chronic hypoxic respiratory failure O2 2 L / APS     ACE in 2018 was down to 50 ( 9-67) MTX, , Proventil HFA, Trelegy, -----f/u Sarcoidosis.SOB  Not using O2 routinely. Feels breathing stable with some DOE. Dry cough. Denies chest pain, change in rash, fever, nodes. Discussed Covid vax when available. Wants flu vax. CXR 01/31/2018- Severe chronic interstitial lung disease.  No acute findings.  04/18/19    64 year old female never smoker followed for Sarcoid III  involving eyes/iritis, lungs,rectum, skin-bronchoscopy 2000, chronic hypoxic respiratory failure O2 2 Lsleep / APS     ACE in 2018 was down to 50 ( 9-67) MTX, , Proventil HFA, Trelegy, Had VATS/ robotic pleurectomy for recurrent ptx realted to fibrocyxtic sarcoid. Now has just mild dry cough. Twinges sharp R chest wall pain- residual.  Review of Systems- see HPI   + = positive Constitutional:   weight loss, night sweats, fevers, chills, fatigue, lassitude. HEENT:   No-  headaches, difficulty swallowing, tooth/dental problems, sore throat,       No-  sneezing, itching, ear ache, nasal congestion, post nasal drip,  CV:  +chest pain, orthopnea, PND, swelling in lower extremities, anasarca,   dizziness, palpitations Resp:+shortness of breath with exertion or at rest.               productive cough,  + non-productive cough,  No-  coughing up of blood.              No-   change in color of mucus.  No- wheezing.   Skin: +  rash or lesions. GI:  No-   heartburn, indigestion, abdominal pain, nausea, vomiting,  GU:  MS:  No-   joint pain or swelling.  No- decreased range of motion.  No- back pain. Neuro- nothing unusual Psych:  No- change in mood or affect. No depression or anxiety.  No memory loss.  Objective:   Physical Exam General- Alert, Oriented, Affect-appropriate, Distress- none acute,  Skin- + multiple small linear plaques along flexure lines left side of neck, down onto upper chest, low back,  in the right posterior axillary line and extensor surfaces of forearms and elbows she has stable macular lesions which she says itch and are tender. +  Reddened nonspecific dermatitis dorsum right hand. Lymphadenopathy- none Head- atraumatic            Eyes- Gross vision intact, PERRLA, conjunctivae clear secretions            Ears- Hearing, canals normal            Nose- Clear, No-Septal dev, mucus, polyps, erosion, perforation             Throat- Mallampati IV , mucosa clear , drainage- none,  tonsils- atrophic Neck- flexible , trachea midline, no stridor , thyroid nl, carotid no bruit Chest - symmetrical excursion , unlabored           Heart/CV- RRR , no murmur , no gallop  , no rub, nl s1,    +P2 prominent                           - JVD +1 , edema- none, stasis changes- none, varices- none           Lung- + few crackles, wheeze- none, cough - none , dullness-none, rub- none,                 Chest wall- + 2 cm rubbery somewhat mobile subcutaneous nodule at distal end of left clavicle- probable lipoma Abd- no HSM Br/ Gen/ Rectal- Not done, not indicated Extrem- cyanosis- none, clubbing, none, atrophy- none, strength- nl, +1 cm rubbery nodule at base R thumb Neuro- grossly intact to observation Assessment & Plan:

## 2019-04-18 NOTE — Patient Instructions (Signed)
Continue oxygen at 1 l for sleep until our next visit. I want you to have time to completely recover from your surgery.   When we get you back, we wil arrange for an overnight oximetry test on room air, and do a walk test to see if you still need your oxygen then.

## 2019-04-25 ENCOUNTER — Encounter: Payer: Self-pay | Admitting: Internal Medicine

## 2019-04-30 ENCOUNTER — Other Ambulatory Visit: Payer: Self-pay | Admitting: Cardiothoracic Surgery

## 2019-04-30 DIAGNOSIS — D869 Sarcoidosis, unspecified: Secondary | ICD-10-CM

## 2019-05-02 ENCOUNTER — Other Ambulatory Visit: Payer: Self-pay

## 2019-05-02 ENCOUNTER — Ambulatory Visit (INDEPENDENT_AMBULATORY_CARE_PROVIDER_SITE_OTHER): Payer: Self-pay | Admitting: Cardiothoracic Surgery

## 2019-05-02 ENCOUNTER — Ambulatory Visit
Admission: RE | Admit: 2019-05-02 | Discharge: 2019-05-02 | Disposition: A | Discharge status: Home / Self Care | Payer: 59 | Source: Ambulatory Visit | Attending: Cardiothoracic Surgery | Admitting: Cardiothoracic Surgery

## 2019-05-02 VITALS — BP 157/77 | HR 66 | Temp 97.2°F | Resp 20 | Ht 62.0 in | Wt 131.5 lb

## 2019-05-02 DIAGNOSIS — J939 Pneumothorax, unspecified: Secondary | ICD-10-CM

## 2019-05-02 DIAGNOSIS — D869 Sarcoidosis, unspecified: Secondary | ICD-10-CM

## 2019-05-02 NOTE — Progress Notes (Signed)
[address]       [place] [address]             [phone]      Lauren Massey [place] Medical Record [id number] Date of Birth: 12-25-1955  Referring: Deneise Lever, MD Primary Care: Hoyt Koch, MD Primary Cardiologist: Donato Heinz, MD   Chief Complaint:   POST OP FOLLOW UP OPERATIVE REPORT DATE OF PROCEDURE:  03/12/2019 PREOPERATIVE DIAGNOSIS:  Recurrent right pneumothorax with underlying severe pulmonary sarcoidosis. POSTOPERATIVE DIAGNOSIS:  Recurrent right pneumothorax with underlying severe pulmonary sarcoidosis. SURGICAL PROCEDURES:   1.  Right robotic-assisted thoracoscopy.  2.  Pleurectomy.  3.  Pleurodesis. 4.  Video bronchoscopy. SURGEON:  Lanelle Bal, MD  History of Present Illness:     Patient returns to the office today in follow-up after recent readmission for recurrent pneumothorax, at the time of admission robotic assisted pleurectomy and pleurodesis was performed.  The patient has known severe underlying sarcoidosis.   Patient returns to the office today with follow-up chest x-ray  She has recently been reevaluated by chronic underlying severe pulmonary sarcoidosis.   Past Medical History:  Diagnosis Date  . Cataract    right eye  . Sarcoidosis    skin, eye, lung  . Unspecified iridocyclitis      Social History   Tobacco Use  Smoking Status Never Smoker  Smokeless Tobacco Never Used    Social History   Substance and Sexual Activity  Alcohol Use No     No Known Allergies  Current Outpatient Medications  Medication Sig Dispense Refill  . albuterol (VENTOLIN HFA) 108 (90 Base) MCG/ACT inhaler Inhale 2 puffs into the lungs every 6 (six) hours as needed for wheezing or shortness of breath. 18 g 12  . folic acid (FOLVITE) 1 MG tablet Take 1 mg by mouth daily.    . hydrochlorothiazide (MICROZIDE) 12.5 MG capsule Take 1 capsule (12.5 mg total) by mouth daily. 90 capsule 3  .  methotrexate (RHEUMATREX) 2.5 MG tablet Take 12.5 mg by mouth once a week. Caution:Chemotherapy. Protect from light.  Pt takes 5 tablets every Wednesday.    . prednisoLONE acetate (PRED FORTE) 1 % ophthalmic suspension Place 1 drop into the right eye 2 (two) times daily. Reported on 04/16/2015 (Patient taking differently: Place 1 drop into the right eye 4 (four) times daily. Reported on 04/16/2015) 5 mL 1   No current facility-administered medications for this visit.       Physical Exam: BP (!) 157/77 (BP Location: Left Arm)   Pulse 66   Temp (!) 97.2 F (36.2 C) (Temporal)   Resp 20   Ht 5\' 2"  (1.575 m)   Wt 131 lb 8 oz (59.6 kg)   SpO2 96% Comment: RA  BMI 24.05 kg/m   General appearance: alert, cooperative and no distress Neck: no adenopathy, no carotid bruit, no JVD, supple, symmetrical, trachea midline and thyroid not enlarged, symmetric, no tenderness/mass/nodules Resp: clear to auscultation bilaterally Cardio: regular rate and rhythm, S1, S2 normal, no murmur, click, rub or gallop Extremities: extremities normal, atraumatic, no cyanosis or edema Neurologic: Grossly normal    Diagnostic Studies & Laboratory data:     Recent Radiology Findings:   DG Chest 2 View  Result Date: 05/02/2019 CLINICAL DATA:  Sarcoidosis, shortness of breath EXAM: CHEST - 2 VIEW COMPARISON:  04/04/2019 FINDINGS: Normal heart size, mediastinal contours and pulmonary vascularity. Atherosclerotic calcification aorta. Chronic interstitial changes throughout both lungs  greatest in RIGHT upper lobe similar to prior exam. RIGHT apex pneumothorax seen on previous exam resolved. Basilar bullous disease. No definite infiltrate, pleural effusion or pneumothorax. Osseous structures unremarkable. IMPRESSION: Severe chronic interstitial lung disease changes related to sarcoidosis. Resolution of previously identified small RIGHT apex pneumothorax. No new abnormalities. Electronically Signed   By: Lavonia Dana M.D.    On: 05/02/2019 13:07    I have independently reviewed the above radiology studies  and reviewed the findings with the patient.    Recent Lab Findings: Lab Results  Component Value Date   WBC 6.2 03/13/2019   HGB 12.1 03/13/2019   HCT 40.8 03/13/2019   PLT 202 03/13/2019   GLUCOSE 109 (H) 03/13/2019   CHOL 233 (A) 11/17/2018   TRIG 106 11/17/2018   HDL 77 (A) 11/17/2018   LDLCALC 18 11/17/2018   ALT 35 03/12/2019   AST 44 (H) 03/12/2019   NA 135 03/13/2019   K 4.5 03/13/2019   CL 94 (L) 03/13/2019   CREATININE 0.67 03/13/2019   BUN 14 03/13/2019   CO2 31 03/13/2019   TSH 1.87 08/03/2015   INR 1.0 03/11/2019      Assessment / Plan:   Patient is postop from  robotic pleurectomy pleurodesis for recurrent pneumothoraces on the right.   Chest x-ray- with resolution of PTX Stable post surgery Will return to work - no lifting over 25 lbs for 3 weeks , regular shifts April 12   Medication Changes: No orders of the defined types were placed in this encounter.     Grace Isaac MD      [address] [place] [address] Office [phone]     05/03/2019 12:35 PM

## 2019-05-06 ENCOUNTER — Encounter: Payer: Self-pay | Admitting: Gastroenterology

## 2019-05-06 ENCOUNTER — Ambulatory Visit (INDEPENDENT_AMBULATORY_CARE_PROVIDER_SITE_OTHER): Payer: 59 | Admitting: Gastroenterology

## 2019-05-06 VITALS — BP 120/70 | HR 74 | Temp 97.2°F | Ht 62.0 in | Wt 133.0 lb

## 2019-05-06 DIAGNOSIS — Z9981 Dependence on supplemental oxygen: Secondary | ICD-10-CM

## 2019-05-06 DIAGNOSIS — J9611 Chronic respiratory failure with hypoxia: Secondary | ICD-10-CM | POA: Diagnosis not present

## 2019-05-06 DIAGNOSIS — D869 Sarcoidosis, unspecified: Secondary | ICD-10-CM

## 2019-05-06 DIAGNOSIS — Z86010 Personal history of colonic polyps: Secondary | ICD-10-CM | POA: Diagnosis not present

## 2019-05-06 NOTE — Patient Instructions (Signed)
You have been scheduled for a colonoscopy. Please follow written instructions given to you at your visit today.  Please pick up your prep supplies at the pharmacy within the next 1-3 days. If you use inhalers (even only as needed), please bring them with you on the day of your procedure.   

## 2019-05-06 NOTE — Progress Notes (Signed)
Lauren Massey    [id number]    July 21, 1955  Primary Care Physician:Crawford, Real Cons, MD  Referring Physician: Hoyt Koch, MD [address] [place],  [place] [address]   Chief complaint:  H/o colon polyps  HPI: 64 year old female with pulmonary sarcoidosis, chronic hypoxia on home O2  Colonoscopy February 19, 2016: 2 pedunculated polyps 9 to 14 mm in size removed with hot snare in the sigmoid colon.  Rectal polyp consistent with nonnecrotizing granuloma, sarcoidosis.  Internal hemorrhoids  Denies any nausea, vomiting, abdominal pain, melena or bright red blood per rectum  She uses home O2 only when she sleeps other wise denies any dyspnea or exertion or chest pain.   Outpatient Encounter Medications as of 05/06/2019  Medication Sig  . albuterol (VENTOLIN HFA) 108 (90 Base) MCG/ACT inhaler Inhale 2 puffs into the lungs every 6 (six) hours as needed for wheezing or shortness of breath.  . folic acid (FOLVITE) 1 MG tablet Take 1 mg by mouth daily.  . hydrochlorothiazide (MICROZIDE) 12.5 MG capsule Take 1 capsule (12.5 mg total) by mouth daily.  . methotrexate (RHEUMATREX) 2.5 MG tablet Take 12.5 mg by mouth once a week. Caution:Chemotherapy. Protect from light.  Pt takes 5 tablets every Wednesday.  . prednisoLONE acetate (PRED FORTE) 1 % ophthalmic suspension Place 1 drop into the right eye 2 (two) times daily. Reported on 04/16/2015 (Patient taking differently: Place 1 drop into the right eye 4 (four) times daily. Reported on 04/16/2015)   No facility-administered encounter medications on file as of 05/06/2019.    Allergies as of 05/06/2019  . (No Known Allergies)    Past Medical History:  Diagnosis Date  . Cataract    right eye  . Sarcoidosis    skin, eye, lung  . Unspecified iridocyclitis     Past Surgical History:  Procedure Laterality Date  . ABDOMINAL HYSTERECTOMY    . APPENDECTOMY    . BRONCHOSCOPY  2003  . CATARACT EXTRACTION    .  INTERCOSTAL NERVE BLOCK Right 03/12/2019   Procedure: Intercostal Nerve Block;  Surgeon: Grace Isaac, MD;  Location: [facility];  Service: Thoracic;  Laterality: Right;  Marland Kitchen VESICOVAGINAL FISTULA CLOSURE W/ TAH    . VIDEO BRONCHOSCOPY N/A 03/12/2019   Procedure: Video Bronchoscopy;  Surgeon: Grace Isaac, MD;  Location: [facility];  Service: Thoracic;  Laterality: N/A;    Family History  Problem Relation Age of Onset  . Cancer Father   . Heart disease Father   . Diabetes Other   . Colon cancer Neg Hx     Social History   Socioeconomic History  . Marital status: Married    Spouse name: Not on file  . Number of children: 3  . Years of education: Not on file  . Highest education level: Not on file  Occupational History  . 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 Smoking status: Never Smoker  . Smokeless tobacco: Never Used  Substance and Sexual Activity  . Alcohol use: No  . Drug use: No  . Sexual activity: Not on file  Other Topics Concern  . Not on file  Social History Narrative  . Not on file   Social Determinants of Health   Financial Resource Strain:   . Difficulty of Paying Living Expenses:   Food Insecurity:   . Worried About Charity fundraiser in the Last Year:   .  Ran Out of Food in the Last Year:   Transportation Needs:   . Film/video editor (Medical):   Marland Kitchen Lack of Transportation (Non-Medical):   Physical Activity:   . Days of Exercise per Week:   . Minutes of Exercise per Session:   Stress:   . Feeling of Stress :   Social Connections:   . Frequency of Communication with Friends and Family:   . Frequency of Social Gatherings with Friends and Family:   . Attends Religious Services:   . Active Member of Clubs or Organizations:   . Attends Archivist Meetings:   Marland Kitchen Marital Status:   Intimate Partner Violence:   . Fear of Current or Ex-Partner:   . Emotionally Abused:   Marland Kitchen Physically Abused:   . Sexually  Abused:       Review of systems: All other review of systems negative except as mentioned in the HPI.   Physical Exam: Vitals:   05/06/19 1604  BP: 120/70  Pulse: 74  Temp: (!) 97.2 F (36.2 C)  SpO2: 95%   Body mass index is 24.33 kg/m. Gen:      No acute distress Neuro: alert and oriented x 3 Psych: normal mood and affect  Data Reviewed:  Reviewed labs, radiology imaging, old records and pertinent past GI work up   Assessment and Plan/Recommendations:  39 yr F with h/o sarcoidosis on home O2, history of adenomatous colon polyps >1cm in size Due for surveillance colonoscopy Will schedule the procedure at [facility] endo unit given oxygen requirement The risks and benefits as well as alternatives of endoscopic procedure(s) have been discussed and reviewed. All questions answered. The patient agrees to proceed.    The patient was provided an opportunity to ask questions and all were answered. The patient agreed with the plan and demonstrated an understanding of the instructions.  Damaris Hippo , MD    CC: Hoyt Koch, *

## 2019-05-17 ENCOUNTER — Encounter: Payer: 59 | Admitting: Gastroenterology

## 2019-05-19 NOTE — Assessment & Plan Note (Signed)
Stage IV burned out sarcoid with fibrobullous scarring. Hopefully pleurectomy will be sufficient to [prevent further PTX. She was not seeking potential future lung transplant.

## 2019-05-19 NOTE — Assessment & Plan Note (Signed)
By next visit she will have stabilized and I anticipate reassessment of O2 need then with walk test and onox.

## 2019-05-31 ENCOUNTER — Ambulatory Visit: Payer: 59 | Admitting: Adult Health

## 2019-05-31 ENCOUNTER — Other Ambulatory Visit: Payer: Self-pay

## 2019-05-31 ENCOUNTER — Encounter: Payer: Self-pay | Admitting: Adult Health

## 2019-05-31 VITALS — BP 136/80 | HR 64 | Ht 62.0 in | Wt 134.0 lb

## 2019-05-31 DIAGNOSIS — J849 Interstitial pulmonary disease, unspecified: Secondary | ICD-10-CM | POA: Diagnosis not present

## 2019-05-31 DIAGNOSIS — D869 Sarcoidosis, unspecified: Secondary | ICD-10-CM | POA: Diagnosis not present

## 2019-05-31 DIAGNOSIS — J9611 Chronic respiratory failure with hypoxia: Secondary | ICD-10-CM | POA: Diagnosis not present

## 2019-05-31 DIAGNOSIS — J939 Pneumothorax, unspecified: Secondary | ICD-10-CM | POA: Diagnosis not present

## 2019-05-31 NOTE — Progress Notes (Signed)
@Patient  ID: Lauren Massey, female    DOB: 25-May-1955, 64 y.o.   MRN: [id number]  Chief Complaint  Patient presents with  . Follow-up    Sarcoid     Referring provider: Hoyt Koch, *  HPI: 64 year old female never smoker followed for chronic fibrotic sarcoidosis with multisystem involvement including lungs, skin, eyes-diagnosed via bronchoscopy 2000.  On weekly methotrexate Right-sided spontaneous pneumothorax January 2021 requiring chest tube, and February 2021 seen by thoracic surgery status post pleurectomy and pleurodesis  chronic respiratory failure on oxygen 2 L with activity and as needed  TEST/EVENTS :  Failed Plaquenil for skin rash. ACE 02/19/15- elevated 97, down from >100 07/2014.  ACE 05/12/16- 50 (9-67) Office Spirometry 08/06/2015-moderate restriction. FVC 1.53/63%, FEV1 1.19/62%, FEV1/FVC 0.78 Room air saturation at rest on arrival 03/14/16-88%, desaturating to 85% with ambulation and improving to 98% ambulating on 2 L. PFT 03/16/17-moderately severe restriction, severe reduction of diffusion. FVC 1.62/66%, FEV1 1.46/76%, ratio 0.90, TLC 50%, DLCO 26%  05/31/2019 Follow up : Sarcoid  Patient returns for a 72-month follow-up.   Patient had recurrent right-sided spontaneous pneumothorax and January and February 2021.  Was seen by thoracic surgery and underwent a robotic pleurectomy with pleurodesis.  Chest x-ray April 2021 showed severe chronic interstitial lung disease changes related to sarcoidosis and resolution of previous right pneumothorax. Says she is doing better.  She is trying to increase her activity.  She has returned back to work.  She works full-time. She denies any flare of cough or shortness of breath.  Gets winded with heavy activity. She follows with ophthalmology on a routine basis. She remains on oxygen 1 L at bedtime.  Does use it with activity but rarely has to use it.   No Known Allergies  Immunization History  Administered Date(s)  Administered  . Influenza Split 12/01/2011  . Influenza Whole 10/19/2007  . Influenza,inj,Quad PF,6+ Mos 10/01/2013, 02/19/2015, 11/09/2015, 11/02/2017, 02/08/2019  . PFIZER SARS-COV-2 Vaccination 04/13/2019, 05/04/2019  . Pneumococcal Polysaccharide-23 12/01/2011  . Tdap 02/05/2018    Past Medical History:  Diagnosis Date  . Cataract    right eye  . Sarcoidosis    skin, eye, lung  . Unspecified iridocyclitis     Tobacco History: Social History   Tobacco Use  Smoking Status Never Smoker  Smokeless Tobacco Never Used   Counseling given: Not Answered   Outpatient Medications Prior to Visit  Medication Sig Dispense Refill  . albuterol (VENTOLIN HFA) 108 (90 Base) MCG/ACT inhaler Inhale 2 puffs into the lungs every 6 (six) hours as needed for wheezing or shortness of breath. 18 g 12  . folic acid (FOLVITE) 1 MG tablet Take 1 mg by mouth daily.    . hydrochlorothiazide (MICROZIDE) 12.5 MG capsule Take 1 capsule (12.5 mg total) by mouth daily. 90 capsule 3  . methotrexate (RHEUMATREX) 2.5 MG tablet Take 12.5 mg by mouth once a week. Caution:Chemotherapy. Protect from light.  Pt takes 5 tablets every Wednesday.    . prednisoLONE acetate (PRED FORTE) 1 % ophthalmic suspension Place 1 drop into the right eye 2 (two) times daily. Reported on 04/16/2015 (Patient taking differently: Place 1 drop into the right eye 4 (four) times daily. Reported on 04/16/2015) 5 mL 1   No facility-administered medications prior to visit.     Review of Systems:   Constitutional:   No  weight loss, night sweats,  Fevers, chills, + fatigue, or  lassitude.  HEENT:   No headaches,  Difficulty swallowing,  Tooth/dental problems, or  Sore throat,                No sneezing, itching, ear ache, nasal congestion, post nasal drip,   CV:  No chest pain,  Orthopnea, PND, swelling in lower extremities, anasarca, dizziness, palpitations, syncope.   GI  No heartburn, indigestion, abdominal pain, nausea,  vomiting, diarrhea, change in bowel habits, loss of appetite, bloody stools.   Resp:  No excess mucus, no productive cough,  No non-productive cough,  No coughing up of blood.  No change in color of mucus.  No wheezing.  No chest wall deformity  Skin: no rash or lesions.  GU: no dysuria, change in color of urine, no urgency or frequency.  No flank pain, no hematuria   MS:  No joint pain or swelling.  No decreased range of motion.  No back pain.    Physical Exam  BP 136/80 (BP Location: Left Arm, Cuff Size: Normal)   Pulse 64   Ht 5\' 2"  (1.575 m)   Wt 134 lb (60.8 kg)   SpO2 94%   BMI 24.51 kg/m   GEN: A/Ox3; pleasant , NAD, well nourished    HEENT:  [place]/AT,  NOSE-clear, THROAT-clear, no lesions, no postnasal drip or exudate noted.   NECK:  Supple w/ fair ROM; no JVD; normal carotid impulses w/o bruits; no thyromegaly or nodules palpated; no lymphadenopathy.    RESP  Clear  P & A; w/o, wheezes/ rales/ or rhonchi. no accessory muscle use, no dullness to percussion  CARD:  RRR, no m/r/g, no peripheral edema, pulses intact, no cyanosis or clubbing.  GI:   Soft & nt; nml bowel sounds; no organomegaly or masses detected.   Musco: Warm bil, no deformities or joint swelling noted.   Neuro: alert, no focal deficits noted.    Skin: Warm, no lesions or rashes    Lab Results:  CBC  ProBNP   Imaging: DG Chest 2 View  Result Date: 05/02/2019 CLINICAL DATA:  Sarcoidosis, shortness of breath EXAM: CHEST - 2 VIEW COMPARISON:  04/04/2019 FINDINGS: Normal heart size, mediastinal contours and pulmonary vascularity. Atherosclerotic calcification aorta. Chronic interstitial changes throughout both lungs greatest in RIGHT upper lobe similar to prior exam. RIGHT apex pneumothorax seen on previous exam resolved. Basilar bullous disease. No definite infiltrate, pleural effusion or pneumothorax. Osseous structures unremarkable. IMPRESSION: Severe chronic interstitial lung disease changes  related to sarcoidosis. Resolution of previously identified small RIGHT apex pneumothorax. No new abnormalities. Electronically Signed   By: Lavonia Dana M.D.   On: 05/02/2019 13:07      PFT Results Latest Ref Rng & Units 03/16/2017 10/01/2013  FVC-Pre L 1.63 1.95  FVC-Predicted Pre % 67 77  FVC-Post L 1.62 1.93  FVC-Predicted Post % 66 76  Pre FEV1/FVC % % 87 80  Post FEV1/FCV % % 90 83  FEV1-Pre L 1.42 1.56  FEV1-Predicted Pre % 74 78  FEV1-Post L 1.46 1.61  DLCO UNC% % 26 61  DLCO COR %Predicted % 55 100  TLC L 2.43 3.00  TLC % Predicted % 50 62  RV % Predicted % 41 59    No results found for: NITRICOXIDE      Assessment & Plan:   No problem-specific Assessment & Plan notes found for this encounter.     Rexene Edison, NP 05/31/2019

## 2019-05-31 NOTE — Assessment & Plan Note (Signed)
Chronic fibrotic sarcoidosis.  Currently stable.  Patient is continue activity as tolerated.

## 2019-05-31 NOTE — Assessment & Plan Note (Signed)
Currently stable without flare.

## 2019-05-31 NOTE — Assessment & Plan Note (Signed)
Continue on oxygen at bedtime.  Check [place] to see if oxygen is still needed at nighttime. Stay with activity as needed to keep O2 saturations greater than 88%.

## 2019-05-31 NOTE — Assessment & Plan Note (Signed)
Right-sided recurrent pneumothorax January and February 2021 status post pleurectomy and pleurodesis.  Doing well from surgery.  Continue to advance activity as tolerated.  Avoid heavy lifting.

## 2019-05-31 NOTE — Patient Instructions (Signed)
Activity as tolerated.  Avoid heavy lifting .  Check overnight oximetry test on room air.  Continue on Oxygen 1l/m At bedtime  .  Follow up with Dr. Annamaria Boots  In 6 months and As needed

## 2019-06-07 ENCOUNTER — Ambulatory Visit: Payer: 59 | Admitting: Internal Medicine

## 2019-06-26 ENCOUNTER — Telehealth: Payer: Self-pay | Admitting: *Deleted

## 2019-06-26 NOTE — Telephone Encounter (Signed)
Yes O2 @ qhs is still needed.  + desats while sleeping.  Recommend continued O2 @ qHS.  Per Rexene Edison NP

## 2019-06-28 ENCOUNTER — Other Ambulatory Visit (HOSPITAL_COMMUNITY)
Admission: RE | Admit: 2019-06-28 | Discharge: 2019-06-28 | Disposition: A | Discharge status: Home / Self Care | Payer: 59 | Source: Ambulatory Visit | Attending: Gastroenterology | Admitting: Gastroenterology

## 2019-06-28 DIAGNOSIS — Z01812 Encounter for preprocedural laboratory examination: Secondary | ICD-10-CM | POA: Insufficient documentation

## 2019-06-28 DIAGNOSIS — Z20822 Contact with and (suspected) exposure to covid-19: Secondary | ICD-10-CM | POA: Diagnosis not present

## 2019-06-28 LAB — SARS CORONAVIRUS 2 (TAT 6-24 HRS): SARS Coronavirus 2: NEGATIVE

## 2019-07-02 ENCOUNTER — Encounter (HOSPITAL_COMMUNITY)
Admission: RE | Disposition: A | Discharge status: Home / Self Care | Payer: Self-pay | Source: Home / Self Care | Attending: Gastroenterology

## 2019-07-02 ENCOUNTER — Ambulatory Visit (HOSPITAL_COMMUNITY)
Admission: RE | Admit: 2019-07-02 | Discharge: 2019-07-02 | Disposition: A | Discharge status: Home / Self Care | Payer: 59 | Attending: Gastroenterology | Admitting: Gastroenterology

## 2019-07-02 ENCOUNTER — Encounter (HOSPITAL_COMMUNITY): Payer: Self-pay | Admitting: Anesthesiology

## 2019-07-02 ENCOUNTER — Encounter (HOSPITAL_COMMUNITY): Payer: Self-pay | Admitting: Gastroenterology

## 2019-07-02 DIAGNOSIS — Z1211 Encounter for screening for malignant neoplasm of colon: Secondary | ICD-10-CM | POA: Diagnosis not present

## 2019-07-02 DIAGNOSIS — Z5309 Procedure and treatment not carried out because of other contraindication: Secondary | ICD-10-CM | POA: Insufficient documentation

## 2019-07-02 SURGERY — CANCELLED PROCEDURE

## 2019-07-02 SURGICAL SUPPLY — 21 items

## 2019-07-02 NOTE — Progress Notes (Signed)
Colonoscopy cancelled per Dr. Silverio Decamp for patient having allergic reaction last night requiring medical treatment. Dr. Silverio Decamp spoke with patient before her discharge to home.

## 2019-07-02 NOTE — H&P (Signed)
Patient did not tolerate Sutab prep, she developed rash tightness in her chest and breathing difficulty after taking the first half.  She called EMS, was given a breathing treatment and Benadryl.  She used her home oxygen overnight.  She still has some chest tightness and feeling of swelling in her face.  She did not take the second half of Su tabs.  She has no known medication allergies.  She tolerated Suprep well for prior colonoscopy with no issues.  We will cancel the procedure for today, plan to reschedule it for later, to be scheduled at [facility] endoscopy unit.  Will do MiraLAX or Suprep.

## 2019-07-16 ENCOUNTER — Emergency Department (HOSPITAL_BASED_OUTPATIENT_CLINIC_OR_DEPARTMENT_OTHER): Payer: 59

## 2019-07-16 ENCOUNTER — Telehealth: Payer: Self-pay | Admitting: Internal Medicine

## 2019-07-16 ENCOUNTER — Other Ambulatory Visit: Payer: Self-pay

## 2019-07-16 ENCOUNTER — Encounter (HOSPITAL_BASED_OUTPATIENT_CLINIC_OR_DEPARTMENT_OTHER): Payer: Self-pay

## 2019-07-16 ENCOUNTER — Inpatient Hospital Stay (HOSPITAL_BASED_OUTPATIENT_CLINIC_OR_DEPARTMENT_OTHER)
Admission: EM | Admit: 2019-07-16 | Discharge: 2019-07-23 | DRG: 197 | Disposition: A | Discharge status: Home / Self Care | Payer: 59 | Attending: Internal Medicine | Admitting: Internal Medicine

## 2019-07-16 ENCOUNTER — Inpatient Hospital Stay (HOSPITAL_COMMUNITY): Payer: 59

## 2019-07-16 DIAGNOSIS — J939 Pneumothorax, unspecified: Secondary | ICD-10-CM | POA: Diagnosis present

## 2019-07-16 DIAGNOSIS — Z8249 Family history of ischemic heart disease and other diseases of the circulatory system: Secondary | ICD-10-CM | POA: Diagnosis not present

## 2019-07-16 DIAGNOSIS — Z9049 Acquired absence of other specified parts of digestive tract: Secondary | ICD-10-CM

## 2019-07-16 DIAGNOSIS — Z90710 Acquired absence of both cervix and uterus: Secondary | ICD-10-CM

## 2019-07-16 DIAGNOSIS — J849 Interstitial pulmonary disease, unspecified: Secondary | ICD-10-CM | POA: Diagnosis not present

## 2019-07-16 DIAGNOSIS — J9611 Chronic respiratory failure with hypoxia: Secondary | ICD-10-CM | POA: Diagnosis present

## 2019-07-16 DIAGNOSIS — D869 Sarcoidosis, unspecified: Secondary | ICD-10-CM | POA: Diagnosis not present

## 2019-07-16 DIAGNOSIS — Z833 Family history of diabetes mellitus: Secondary | ICD-10-CM | POA: Diagnosis not present

## 2019-07-16 DIAGNOSIS — Z79899 Other long term (current) drug therapy: Secondary | ICD-10-CM

## 2019-07-16 DIAGNOSIS — J9383 Other pneumothorax: Secondary | ICD-10-CM | POA: Diagnosis not present

## 2019-07-16 DIAGNOSIS — J9382 Other air leak: Secondary | ICD-10-CM | POA: Diagnosis not present

## 2019-07-16 DIAGNOSIS — D638 Anemia in other chronic diseases classified elsewhere: Secondary | ICD-10-CM | POA: Diagnosis present

## 2019-07-16 DIAGNOSIS — J930 Spontaneous tension pneumothorax: Secondary | ICD-10-CM | POA: Diagnosis not present

## 2019-07-16 DIAGNOSIS — J9312 Secondary spontaneous pneumothorax: Secondary | ICD-10-CM | POA: Diagnosis present

## 2019-07-16 DIAGNOSIS — Z9981 Dependence on supplemental oxygen: Secondary | ICD-10-CM

## 2019-07-16 DIAGNOSIS — I10 Essential (primary) hypertension: Secondary | ICD-10-CM | POA: Diagnosis present

## 2019-07-16 DIAGNOSIS — J9601 Acute respiratory failure with hypoxia: Secondary | ICD-10-CM | POA: Diagnosis present

## 2019-07-16 DIAGNOSIS — Z820 Family history of epilepsy and other diseases of the nervous system: Secondary | ICD-10-CM

## 2019-07-16 DIAGNOSIS — J984 Other disorders of lung: Secondary | ICD-10-CM | POA: Diagnosis present

## 2019-07-16 DIAGNOSIS — Z9889 Other specified postprocedural states: Secondary | ICD-10-CM

## 2019-07-16 DIAGNOSIS — D860 Sarcoidosis of lung: Secondary | ICD-10-CM | POA: Diagnosis present

## 2019-07-16 DIAGNOSIS — Z20822 Contact with and (suspected) exposure to covid-19: Secondary | ICD-10-CM | POA: Diagnosis present

## 2019-07-16 DIAGNOSIS — Z9689 Presence of other specified functional implants: Secondary | ICD-10-CM

## 2019-07-16 DIAGNOSIS — J918 Pleural effusion in other conditions classified elsewhere: Secondary | ICD-10-CM | POA: Diagnosis not present

## 2019-07-16 DIAGNOSIS — J90 Pleural effusion, not elsewhere classified: Secondary | ICD-10-CM | POA: Diagnosis not present

## 2019-07-16 HISTORY — DX: Pneumothorax, unspecified: J93.9

## 2019-07-16 LAB — CBC WITH DIFFERENTIAL/PLATELET
Abs Immature Granulocytes: 0 10*3/uL (ref 0.00–0.07)
Basophils Absolute: 0 10*3/uL (ref 0.0–0.1)
Basophils Relative: 1 %
Eosinophils Absolute: 0.1 10*3/uL (ref 0.0–0.5)
Eosinophils Relative: 2 %
HCT: 41.7 % (ref 36.0–46.0)
Hemoglobin: 12.7 g/dL (ref 12.0–15.0)
Immature Granulocytes: 0 %
Lymphocytes Relative: 27 %
Lymphs Abs: 1.2 10*3/uL (ref 0.7–4.0)
MCH: 27.8 pg (ref 26.0–34.0)
MCHC: 30.5 g/dL (ref 30.0–36.0)
MCV: 91.2 fL (ref 80.0–100.0)
Monocytes Absolute: 0.4 10*3/uL (ref 0.1–1.0)
Monocytes Relative: 10 %
Neutro Abs: 2.7 10*3/uL (ref 1.7–7.7)
Neutrophils Relative %: 60 %
Platelets: 154 10*3/uL (ref 150–400)
RBC: 4.57 MIL/uL (ref 3.87–5.11)
RDW: 14.7 % (ref 11.5–15.5)
WBC: 4.4 10*3/uL (ref 4.0–10.5)
nRBC: 0 % (ref 0.0–0.2)

## 2019-07-16 LAB — BASIC METABOLIC PANEL
Anion gap: 9 (ref 5–15)
BUN: 18 mg/dL (ref 8–23)
CO2: 27 mmol/L (ref 22–32)
Calcium: 9.2 mg/dL (ref 8.9–10.3)
Chloride: 101 mmol/L (ref 98–111)
Creatinine, Ser: 0.78 mg/dL (ref 0.44–1.00)
GFR calc Af Amer: >60 mL/min (ref >60)
GFR calc non Af Amer: >60 mL/min (ref >60)
Glucose, Bld: 91 mg/dL (ref 70–99)
Potassium: 3.7 mmol/L (ref 3.5–5.1)
Sodium: 137 mmol/L (ref 135–145)

## 2019-07-16 LAB — TROPONIN I (HIGH SENSITIVITY): Troponin I (High Sensitivity): 3 ng/L (ref <18)

## 2019-07-16 LAB — SARS CORONAVIRUS 2 BY RT PCR (HOSPITAL ORDER, PERFORMED IN ~~LOC~~ HOSPITAL LAB): SARS Coronavirus 2: NEGATIVE

## 2019-07-16 MED ORDER — METHOTREXATE 2.5 MG PO TABS
7.5000 mg | ORAL_TABLET | ORAL | Status: DC
  Administered 2019-07-17: 7.5 mg via ORAL
  Filled 2019-07-16: qty 3

## 2019-07-16 MED ORDER — HYDROMORPHONE HCL 1 MG/ML IJ SOLN
0.5000 mg | Freq: Once | INTRAMUSCULAR | Status: AC
  Administered 2019-07-16: 0.5 mg via INTRAVENOUS
  Filled 2019-07-16: qty 1

## 2019-07-16 MED ORDER — FENTANYL CITRATE (PF) 100 MCG/2ML IJ SOLN: 50.0000 ug | Freq: Once | INTRAMUSCULAR | Status: AC

## 2019-07-16 MED ORDER — FOLIC ACID 1 MG PO TABS
5.0000 mg | ORAL_TABLET | Freq: Every day | ORAL | Status: DC
  Administered 2019-07-17 – 2019-07-23 (×7): 5 mg via ORAL
  Filled 2019-07-16 (×7): qty 5

## 2019-07-16 MED ORDER — FENTANYL CITRATE (PF) 100 MCG/2ML IJ SOLN
INTRAMUSCULAR | Status: AC
  Administered 2019-07-16: 50 ug via INTRAVENOUS
  Filled 2019-07-16: qty 2

## 2019-07-16 MED ORDER — HYDROMORPHONE HCL 1 MG/ML IJ SOLN
0.5000 mg | INTRAMUSCULAR | Status: DC | PRN
  Administered 2019-07-16 – 2019-07-17 (×2): 0.5 mg via INTRAVENOUS
  Administered 2019-07-19: 1 mg via INTRAVENOUS
  Filled 2019-07-16 (×4): qty 1

## 2019-07-16 MED ORDER — KETOROLAC TROMETHAMINE 30 MG/ML IJ SOLN
30.0000 mg | Freq: Four times a day (QID) | INTRAMUSCULAR | Status: AC | PRN
  Administered 2019-07-17 – 2019-07-21 (×9): 30 mg via INTRAVENOUS
  Filled 2019-07-16 (×9): qty 1

## 2019-07-16 MED ORDER — ALBUTEROL SULFATE (2.5 MG/3ML) 0.083% IN NEBU: 3.0000 mL | INHALATION_SOLUTION | Freq: Four times a day (QID) | RESPIRATORY_TRACT | Status: DC | PRN

## 2019-07-16 MED ORDER — ENOXAPARIN SODIUM 40 MG/0.4ML ~~LOC~~ SOLN
40.0000 mg | SUBCUTANEOUS | Status: DC
  Administered 2019-07-16 – 2019-07-22 (×7): 40 mg via SUBCUTANEOUS
  Filled 2019-07-16 (×7): qty 0.4

## 2019-07-16 MED ORDER — ONDANSETRON HCL 4 MG PO TABS
4.0000 mg | ORAL_TABLET | Freq: Four times a day (QID) | ORAL | Status: DC | PRN
  Administered 2019-07-21: 4 mg via ORAL
  Filled 2019-07-16: qty 1

## 2019-07-16 MED ORDER — LIDOCAINE HCL (PF) 1 % IJ SOLN: 20.0000 mL | Freq: Once | INTRAMUSCULAR | Status: DC

## 2019-07-16 MED ORDER — HYDROCHLOROTHIAZIDE 12.5 MG PO CAPS
12.5000 mg | ORAL_CAPSULE | Freq: Every day | ORAL | Status: DC
  Administered 2019-07-17 – 2019-07-23 (×7): 12.5 mg via ORAL
  Filled 2019-07-16 (×7): qty 1

## 2019-07-16 MED ORDER — HYDROMORPHONE HCL 1 MG/ML IJ SOLN: 0.5000 mg | INTRAMUSCULAR | Status: DC | PRN

## 2019-07-16 MED ORDER — KETOROLAC TROMETHAMINE 15 MG/ML IJ SOLN
15.0000 mg | Freq: Once | INTRAMUSCULAR | Status: AC
  Administered 2019-07-16: 15 mg via INTRAVENOUS
  Filled 2019-07-16: qty 1

## 2019-07-16 MED ORDER — ONDANSETRON HCL 4 MG/2ML IJ SOLN
4.0000 mg | Freq: Four times a day (QID) | INTRAMUSCULAR | Status: DC | PRN
  Filled 2019-07-16: qty 2

## 2019-07-16 MED ORDER — SODIUM CHLORIDE 0.9% FLUSH
10.0000 mL | Freq: Three times a day (TID) | INTRAVENOUS | Status: DC
  Administered 2019-07-16 – 2019-07-22 (×16): 10 mL
  Filled 2019-07-16: qty 10

## 2019-07-16 MED ORDER — PREDNISOLONE ACETATE 1 % OP SUSP
1.0000 [drp] | Freq: Four times a day (QID) | OPHTHALMIC | Status: DC
  Administered 2019-07-16 – 2019-07-23 (×26): 1 [drp] via OPHTHALMIC
  Filled 2019-07-16: qty 5

## 2019-07-16 MED ORDER — ACETAMINOPHEN 325 MG PO TABS
650.0000 mg | ORAL_TABLET | Freq: Four times a day (QID) | ORAL | Status: DC | PRN
  Administered 2019-07-19: 650 mg via ORAL
  Filled 2019-07-16: qty 2

## 2019-07-16 MED ORDER — ACETAMINOPHEN 650 MG RE SUPP: 650.0000 mg | Freq: Four times a day (QID) | RECTAL | Status: DC | PRN

## 2019-07-16 NOTE — ED Notes (Signed)
PT tolerated Chest tube placement well. Report to primary RN Ikrame.

## 2019-07-16 NOTE — ED Triage Notes (Signed)
Pt c/o coughing x 2 days -today she started having right flank pain and pain to right breast area and SOB and vomited x 1-pt states she had recent pneumothorax-NAD-to triage in w/c

## 2019-07-16 NOTE — Telephone Encounter (Signed)
Patient contacted by phone to gather more information. She has a history of ILD and a pneumothorax. She has shortness of breath and right sided pain. She reports it hurts to take in a deep breath, also had a episode of emesis this AM. She does not report any other symptoms. Please advise.

## 2019-07-16 NOTE — ED Notes (Signed)
Chest tube documentation for 1645

## 2019-07-16 NOTE — ED Provider Notes (Signed)
[facility] EMERGENCY DEPARTMENT Provider Note   CSN: [id number] Arrival date & time: 07/16/19  1249     History Chief Complaint  Patient presents with  . Cough    Lauren Massey is a 64 y.o. female.  Sarcoidosis, recurrent right pneumothorax.  Presents to ER with difficulty breathing.  Patient reports yesterday she had bad coughing spell and noted shortness of breath.  Worsening throughout the day today.  No fever, no chest pain.  Completed extensive chart review, interstitial lung disease, pulmonary sarcoidosis, recurrent right pneumothorax.  CT surgery Dr. Pia Mau had performed right robotic surgery back in February.  HPI     Past Medical History:  Diagnosis Date  . Cataract    right eye  . Pneumothorax   . Sarcoidosis    skin, eye, lung  . Unspecified iridocyclitis     Patient Active Problem List   Diagnosis Date Noted  . ILD (interstitial lung disease) ([place]) 05/31/2019  . Essential hypertension 04/12/2019  . Pneumothorax 02/22/2019  . Chronic respiratory failure with hypoxia ([place]) 03/17/2017  . Routine general medical examination at a health care facility 02/02/2017  . Loss of weight 08/03/2015  . SARCOIDOSIS, PULMONARY 05/18/2007  . Iridocyclitis 05/18/2007    Past Surgical History:  Procedure Laterality Date  . ABDOMINAL HYSTERECTOMY    . APPENDECTOMY    . BRONCHOSCOPY  2003  . CATARACT EXTRACTION    . INTERCOSTAL NERVE BLOCK Right 03/12/2019   Procedure: Intercostal Nerve Block;  Surgeon: Grace Isaac, MD;  Location: [facility];  Service: Thoracic;  Laterality: Right;  Marland Kitchen VESICOVAGINAL FISTULA CLOSURE W/ TAH    . VIDEO BRONCHOSCOPY N/A 03/12/2019   Procedure: Video Bronchoscopy;  Surgeon: Grace Isaac, MD;  Location: [facility];  Service: Thoracic;  Laterality: N/A;     OB History   No obstetric history on file.     Family History  Problem Relation Age of Onset  . Alzheimer's disease Mother   . Cancer Father   . Heart disease  Father   . Diabetes Other   . Colon cancer Neg Hx   . Esophageal cancer Neg Hx   . Pancreatic cancer Neg Hx   . Stomach cancer Neg Hx     Social History   Tobacco Use  . Smoking status: Never Smoker  . Smokeless tobacco: Never Used  Vaping Use  . Vaping Use: Never used  Substance Use Topics  . Alcohol use: No  . Drug use: No    Home Medications Prior to Admission medications   Medication Sig Start Date End Date Taking? Authorizing Provider  albuterol (VENTOLIN HFA) 108 (90 Base) MCG/ACT inhaler Inhale 2 puffs into the lungs every 6 (six) hours as needed for wheezing or shortness of breath. 02/08/19   Deneise Lever, MD  folic acid (FOLVITE) 1 MG tablet Take 5 mg by mouth daily.     [provider]  hydrochlorothiazide (MICROZIDE) 12.5 MG capsule Take 1 capsule (12.5 mg total) by mouth daily. 04/12/19   Hoyt Koch, MD  methotrexate (RHEUMATREX) 2.5 MG tablet Take 7.5 mg by mouth once a week. Caution:Chemotherapy. Protect from light.  Pt takes 3 tablets every Wednesday.    [provider]  prednisoLONE acetate (PRED FORTE) 1 % ophthalmic suspension Place 1 drop into the right eye 2 (two) times daily. Reported on 04/16/2015 Patient taking differently: Place 1 drop into the right eye 4 (four) times daily. Reported on 04/16/2015 04/16/15   Robyn Haber, MD  Allergies    Patient has no known allergies.  Review of Systems   Review of Systems  Constitutional: Negative for chills and fever.  HENT: Negative for ear pain and sore throat.   Eyes: Negative for pain and visual disturbance.  Respiratory: Positive for cough and shortness of breath.   Cardiovascular: Negative for chest pain and palpitations.  Gastrointestinal: Negative for abdominal pain and vomiting.  Genitourinary: Negative for dysuria and hematuria.  Musculoskeletal: Negative for arthralgias and back pain.  Skin: Negative for color change and rash.  Neurological: Negative for seizures  and syncope.  All other systems reviewed and are negative.   Physical Exam Updated Vital Signs BP 138/82   Pulse 64   Temp 99.6 F (37.6 C) (Oral)   Resp 18   SpO2 96%   Physical Exam Vitals and nursing note reviewed.  Constitutional:      General: She is not in acute distress.    Appearance: She is well-developed.  HENT:     Head: Normocephalic and atraumatic.  Eyes:     Conjunctiva/sclera: Conjunctivae normal.  Cardiovascular:     Rate and Rhythm: Normal rate and regular rhythm.     Heart sounds: No murmur heard.   Pulmonary:     Effort: No respiratory distress.     Comments: Clear breath sounds on left, absent breath sounds on right, mild tachypnea, not in distress Abdominal:     Palpations: Abdomen is soft.     Tenderness: There is no abdominal tenderness.  Musculoskeletal:     Cervical back: Neck supple.  Skin:    General: Skin is warm and dry.  Neurological:     General: No focal deficit present.     Mental Status: She is alert and oriented to person, place, and time.  Psychiatric:        Mood and Affect: Mood normal.        Behavior: Behavior normal.     ED Results / Procedures / Treatments   Labs (all labs ordered are listed, but only abnormal results are displayed) Labs Reviewed  SARS CORONAVIRUS 2 BY RT PCR (HOSPITAL ORDER, [facility])  CBC WITH DIFFERENTIAL/PLATELET  BASIC METABOLIC PANEL  TROPONIN I (HIGH SENSITIVITY)    EKG EKG Interpretation  Date/Time:  Tuesday July 16 2019 13:28:52 EDT Ventricular Rate:  72 PR Interval:    QRS Duration: 86 QT Interval:  400 QTC Calculation: 438 R Axis:   51 Text Interpretation: Sinus rhythm Baseline wander in lead(s) V6 Confirmed by Virgel Manifold [phone]) on 07/16/2019 3:30:55 PM   Radiology DG Chest 1 View  Result Date: 07/16/2019 CLINICAL DATA:  Post chest tube placement EXAM: CHEST  1 VIEW COMPARISON:  07/16/2019, 05/02/2019, CT 03/07/2019 FINDINGS: Interval insertion  of right-sided chest tube with pigtail projecting over the right lower lateral chest. Decreased size of right pneumothorax with residual right lateral and basilar pneumothorax, pleural-parenchymal separation of 1.4 cm laterally. Chronic lung disease with bronchiectasis and fibrosis. Possible acute atelectasis or mild infiltrates at the lung bases. Stable cardiomediastinal silhouette. IMPRESSION: Interval insertion of right-sided chest tube with decreased size of right pneumothorax. Residual right lateral and basilar pneumothorax as above. Chronic lung disease. Possible superimposed atelectasis or mild infiltrates at the bases. Electronically Signed   By: Donavan Foil M.D.   On: 07/16/2019 17:25   DG Chest Portable 1 View  Result Date: 07/16/2019 CLINICAL DATA:  Decreased breath sounds on the right following coughing episodes EXAM: PORTABLE CHEST 1  VIEW COMPARISON:  05/02/2019 FINDINGS: Cardiac shadow is stable. Aortic calcifications are again noted. Scattered fibrotic changes are noted in both lungs consistent with the patient's given clinical history of underlying sarcoidosis. There is a new right-sided pneumothorax centered predominately laterally and inferiorly. No bony abnormality is noted. IMPRESSION: Right-sided pneumothorax as described. Critical Value/emergent results were called by telephone at the time of interpretation on 07/16/2019 at 2:30 pm to Dr. Madalyn Rob , who verbally acknowledged these results. Electronically Signed   By: Inez Catalina M.D.   On: 07/16/2019 14:31    Procedures CHEST TUBE INSERTION  Date/Time: 07/16/2019 5:30 PM Performed by: Lucrezia Starch, MD Authorized by: Lucrezia Starch, MD   Consent:    Consent obtained:  Verbal and written   Consent given by:  Patient   Risks discussed:  Bleeding, incomplete drainage, damage to surrounding structures, infection, pain and nerve damage   Alternatives discussed:  No treatment, delayed treatment and  referral Universal protocol:    Procedure explained and questions answered to patient or proxy's satisfaction: yes     Relevant documents present and verified: yes     Test results available and properly labeled: yes     Imaging studies available: yes     Site/side marked: yes     Immediately prior to procedure a time out was called: yes     Patient identity confirmed:  Verbally with patient, arm band and provided demographic data Pre-procedure details:    Skin preparation:  ChloraPrep Anesthesia (see MAR for exact dosages):    Anesthesia method:  Local infiltration   Local anesthetic:  Lidocaine 1% w/o epi Procedure details:    Placement location:  R lateral   Scalpel size:  11   Tube size (French): 14.   Drainage characteristics:  Air only   Suture material: 4-0 nylon.   Dressing:  4x4 sterile gauze and petrolatum-impregnated gauze Post-procedure details:    Post-insertion x-ray findings: tube in good position     Patient tolerance of procedure:  Tolerated well, no immediate complications Comments:     Site identified at the right inframammary line, mid axillary line.  Timeout called.  Prepped and draped in sterile fashion, injected 5 mL 1% lidocaine without epinephrine for local anesthesia.  Seldinger technique.  Inserted needle, rush of air.  Placed wire, nick skin at entry site of wire, removed needle, inserted dilator over wire, then inserted pigtail catheter. Sutured in place with 4-0 nylon. Placed petroleum gauze dressing.  .Critical Care Performed by: Lucrezia Starch, MD Authorized by: Lucrezia Starch, MD   Critical care provider statement:    Critical care time (minutes):  45   Critical care was necessary to treat or prevent imminent or life-threatening deterioration of the following conditions:  Respiratory failure   Critical care was time spent personally by me on the following activities:  Discussions with consultants, evaluation of patient's response to treatment,  examination of patient, ordering and performing treatments and interventions, ordering and review of laboratory studies, ordering and review of radiographic studies, pulse oximetry, re-evaluation of patient's condition, obtaining history from patient or surrogate and review of old charts   (including critical care time)  Medications Ordered in ED Medications  sodium chloride flush (NS) 0.9 % injection 10 mL (has no administration in time range)  lidocaine (PF) (XYLOCAINE) 1 % injection 20 mL (has no administration in time range)  fentaNYL (SUBLIMAZE) injection 50 mcg (50 mcg Intravenous Given 07/16/19 1620)  fentaNYL (SUBLIMAZE) injection 50  mcg (50 mcg Intravenous Given 07/16/19 1709)    ED Course  I have reviewed the triage vital signs and the nursing notes.  Pertinent labs & imaging results that were available during my care of the patient were reviewed by me and considered in my medical decision making (see chart for details).    MDM Rules/Calculators/A&P                          64 year old lady presented to ER with concern for shortness of breath.  Pulmonary sarcoidosis, recurrent right pneumothorax.  CXR demonstrating recurrent right pneumothorax today.  Discussed with Dr. Servando Snare, he recommended transfer to [facility], place pigtail in ER or ask IR to do it at [facility].  Furthermore asked pulmonology to also be consulted.  Discussed case with Dr. Erskine Emery who asked patient admitted to the hospitalist service, they will be available for consult once patient has arrived to [facility].  Discussed risk and benefits of placing pigtail catheter in ER today versus delay until patient is transferred to [facility].  Recommended that we perform pigtail chest tube catheter placement in ER.  After reviewing risk benefits, patient and husband consented.  Successful chest tube placement, no immediate complications.  Will place to suction for now. Discussed case with Dr. Roosevelt Locks who will admit to [facility]  at [facility].   Final Clinical Impression(s) / ED Diagnoses Final diagnoses:  Pneumothorax, unspecified type  Acute respiratory failure with hypoxia [facility])    Rx / DC Orders ED Discharge Orders    None       Lucrezia Starch, MD 07/16/19 1740

## 2019-07-16 NOTE — Telephone Encounter (Signed)
Patient was called with recommendation to seek emergent care in the ER. Patient verbalized understanding and will seek care today.

## 2019-07-16 NOTE — Telephone Encounter (Signed)
I can't tell if this is a GI issue or chest. Because of her previous pneumothorax, I recommend she go to the ER where she can be properly evaluated .

## 2019-07-16 NOTE — ED Notes (Signed)
Lab called and informed of 2nd PT-INR specimen hemolyzed

## 2019-07-16 NOTE — H&P (Signed)
History and Physical    Lauren Massey KGU:[id number] DOB: [birth year] DOA: 07/16/2019  PCP: Lauren Koch, MD  Patient coming from: Home  I have personally briefly reviewed patient's old medical records in [place]  Chief Complaint: Cough, SOB  HPI: Lauren Massey is a 64 y.o. female with medical history significant of pulmonary sarcoid.  Pt presents to ED with SOB worsening throughout the day today following a bad coughing spell yesterday.  No fever, no CP.  Pt with h/o PTx in Jan, recurrent in Feb so underwent robotic thoracoscopy, pleuradesis, and pleurectomy with Dr. Servando Snare in Feb.   ED Course: Has PTx again today.  CT placed with improvement.  CVTS consulted, asked for hospitalist to admit with PCCM consult for CT management.   Review of Systems: As per HPI, otherwise all review of systems negative.  Past Medical History:  Diagnosis Date  . Cataract    right eye  . Pneumothorax   . Sarcoidosis    skin, eye, lung  . Unspecified iridocyclitis     Past Surgical History:  Procedure Laterality Date  . ABDOMINAL HYSTERECTOMY    . APPENDECTOMY    . BRONCHOSCOPY  2003  . CATARACT EXTRACTION    . INTERCOSTAL NERVE BLOCK Right 03/12/2019   Procedure: Intercostal Nerve Block;  Surgeon: Grace Isaac, MD;  Location: [facility];  Service: Thoracic;  Laterality: Right;  Marland Kitchen VESICOVAGINAL FISTULA CLOSURE W/ TAH    . VIDEO BRONCHOSCOPY N/A 03/12/2019   Procedure: Video Bronchoscopy;  Surgeon: Grace Isaac, MD;  Location: [place];  Service: Thoracic;  Laterality: N/A;     reports that she has never smoked. She has never used smokeless tobacco. She reports that she does not drink alcohol and does not use drugs.  No Known Allergies  Family History  Problem Relation Age of Onset  . Alzheimer's disease Mother   . Cancer Father   . Heart disease Father   . Diabetes Other   . Colon cancer Neg Hx   . Esophageal cancer Neg Hx   . Pancreatic cancer Neg Hx     . Stomach cancer Neg Hx      Prior to Admission medications   Medication Sig Start Date End Date Taking? Authorizing Provider  albuterol (VENTOLIN HFA) 108 (90 Base) MCG/ACT inhaler Inhale 2 puffs into the lungs every 6 (six) hours as needed for wheezing or shortness of breath. 02/08/19   Deneise Lever, MD  folic acid (FOLVITE) 1 MG tablet Take 5 mg by mouth daily.     [provider]  hydrochlorothiazide (MICROZIDE) 12.5 MG capsule Take 1 capsule (12.5 mg total) by mouth daily. 04/12/19   Lauren Koch, MD  methotrexate (RHEUMATREX) 2.5 MG tablet Take 7.5 mg by mouth once a week. Caution:Chemotherapy. Protect from light.  Pt takes 3 tablets every Wednesday.    [provider]  prednisoLONE acetate (PRED FORTE) 1 % ophthalmic suspension Place 1 drop into the right eye 2 (two) times daily. Reported on 04/16/2015 Patient taking differently: Place 1 drop into the right eye 4 (four) times daily. Reported on 04/16/2015 04/16/15   Robyn Haber, MD    Physical Exam: Vitals:   07/16/19 1745 07/16/19 1800 07/16/19 1815 07/16/19 1941  BP: (!) 159/83 (!) 159/83 (!) 165/141 (!) 164/88  Pulse: (!) 58 (!) 58 62 (!) 59  Resp: 18 19 (!) 22   Temp:    98.3 F (36.8 C)  TempSrc:    Oral  SpO2: 96% 98% 99% 100%    Constitutional: NAD, calm, comfortable Eyes: PERRL, lids and conjunctivae normal ENMT: Mucous membranes are moist. Posterior pharynx clear of any exudate or lesions.Normal dentition.  Neck: normal, supple, no masses, no thyromegaly Respiratory: clear to auscultation bilaterally, no wheezing, no crackles. Normal respiratory effort. No accessory muscle use.  Cardiovascular: Regular rate and rhythm, no murmurs / rubs / gallops. No extremity edema. 2+ pedal pulses. No carotid bruits.  Abdomen: no tenderness, no masses palpated. No hepatosplenomegaly. Bowel sounds positive.  Musculoskeletal: no clubbing / cyanosis. No joint deformity upper and lower extremities.  Good ROM, no contractures. Normal muscle tone.  Skin: no rashes, lesions, ulcers. No induration Neurologic: CN 2-12 grossly intact. Sensation intact, DTR normal. Strength 5/5 in all 4.  Psychiatric: Normal judgment and insight. Alert and oriented x 3. Normal mood.    Labs on Admission: I have personally reviewed following labs and imaging studies  CBC: Recent Labs  Lab 07/16/19 1339  WBC 4.4  NEUTROABS 2.7  HGB 12.7  HCT 41.7  MCV 91.2  PLT 101   Basic Metabolic Panel: Recent Labs  Lab 07/16/19 1339  NA 137  K 3.7  CL 101  CO2 27  GLUCOSE 91  BUN 18  CREATININE 0.78  CALCIUM 9.2   GFR: CrCl cannot be calculated (Unknown ideal weight.). Liver Function Tests: No results for input(s): AST, ALT, ALKPHOS, BILITOT, PROT, ALBUMIN in the last 168 hours. No results for input(s): LIPASE, AMYLASE in the last 168 hours. No results for input(s): AMMONIA in the last 168 hours. Coagulation Profile: No results for input(s): INR, PROTIME in the last 168 hours. Cardiac Enzymes: No results for input(s): CKTOTAL, CKMB, CKMBINDEX, TROPONINI in the last 168 hours. BNP (last 3 results) No results for input(s): PROBNP in the last 8760 hours. HbA1C: No results for input(s): HGBA1C in the last 72 hours. CBG: No results for input(s): GLUCAP in the last 168 hours. Lipid Profile: No results for input(s): CHOL, HDL, LDLCALC, TRIG, CHOLHDL, LDLDIRECT in the last 72 hours. Thyroid Function Tests: No results for input(s): TSH, T4TOTAL, FREET4, T3FREE, THYROIDAB in the last 72 hours. Anemia Panel: No results for input(s): VITAMINB12, FOLATE, FERRITIN, TIBC, IRON, RETICCTPCT in the last 72 hours. Urine analysis:    Component Value Date/Time   COLORURINE AMBER (A) 04/24/2015 1116   APPEARANCEUR CLOUDY (A) 04/24/2015 1116   LABSPEC 1.016 04/24/2015 1116   PHURINE 6.5 04/24/2015 1116   GLUCOSEU NEGATIVE 04/24/2015 1116   HGBUR TRACE (A) 04/24/2015 1116   BILIRUBINUR negative 11/02/2017 1047    KETONESUR NEGATIVE 04/24/2015 1116   PROTEINUR Negative 11/02/2017 1047   PROTEINUR 30 (A) 04/24/2015 1116   UROBILINOGEN 0.2 11/02/2017 1047   NITRITE negative 11/02/2017 1047   NITRITE NEGATIVE 04/24/2015 1116   LEUKOCYTESUR Large (3+) (A) 11/02/2017 1047    Radiological Exams on Admission: DG Chest 1 View  Result Date: 07/16/2019 CLINICAL DATA:  Post chest tube placement EXAM: CHEST  1 VIEW COMPARISON:  07/16/2019, 05/02/2019, CT 03/07/2019 FINDINGS: Interval insertion of right-sided chest tube with pigtail projecting over the right lower lateral chest. Decreased size of right pneumothorax with residual right lateral and basilar pneumothorax, pleural-parenchymal separation of 1.4 cm laterally. Chronic lung disease with bronchiectasis and fibrosis. Possible acute atelectasis or mild infiltrates at the lung bases. Stable cardiomediastinal silhouette. IMPRESSION: Interval insertion of right-sided chest tube with decreased size of right pneumothorax. Residual right lateral and basilar pneumothorax as above. Chronic lung disease. Possible superimposed atelectasis or mild  infiltrates at the bases. Electronically Signed   By: Donavan Foil M.D.   On: 07/16/2019 17:25   DG Chest Portable 1 View  Result Date: 07/16/2019 CLINICAL DATA:  Decreased breath sounds on the right following coughing episodes EXAM: PORTABLE CHEST 1 VIEW COMPARISON:  05/02/2019 FINDINGS: Cardiac shadow is stable. Aortic calcifications are again noted. Scattered fibrotic changes are noted in both lungs consistent with the patient's given clinical history of underlying sarcoidosis. There is a new right-sided pneumothorax centered predominately laterally and inferiorly. No bony abnormality is noted. IMPRESSION: Right-sided pneumothorax as described. Critical Value/emergent results were called by telephone at the time of interpretation on 07/16/2019 at 2:30 pm to Dr. Madalyn Rob , who verbally acknowledged these results.  Electronically Signed   By: Inez Catalina M.D.   On: 07/16/2019 14:31    EKG: Independently reviewed.  Assessment/Plan Principal Problem:   Pneumothorax Active Problems:   SARCOIDOSIS, PULMONARY   Chronic respiratory failure with hypoxia (HCC)   Essential hypertension   ILD (interstitial lung disease) ([place])    1. PTx - 1. CT in place 1. PCCM notified of arrival, call them with questions regarding CT 2. Pain control - 1. Tylenol PRN mild pain 2. toradol PRN mod pain 3. Dilaudid PRN severe pain 3. Tele monitor, cont pulse ox 4. CVTS to see in AM 2. Pulmonary sarcoid - 1. Cont MTX 3. HTN - 1. Cont HCTZ  DVT prophylaxis: Lovenox Code Status: Full Family Communication: No family in room Disposition Plan: Home after management of PTx Consults called: PCCM, CVTS Admission status: Admit to inpatient  Severity of Illness: The appropriate patient status for this patient is INPATIENT. Inpatient status is judged to be reasonable and necessary in order to provide the required intensity of service to ensure the patient's safety. The patient's presenting symptoms, physical exam findings, and initial radiographic and laboratory data in the context of their chronic comorbidities is felt to place them at high risk for further clinical deterioration. Furthermore, it is not anticipated that the patient will be medically stable for discharge from the hospital within 2 midnights of admission. The following factors support the patient status of inpatient.   IP status due to recurrent PTx, requiring CT and presumably further care / surgery by CVTS to try and prevent recurrence.   * I certify that at the point of admission it is my clinical judgment that the patient will require inpatient hospital care spanning beyond 2 midnights from the point of admission due to high intensity of service, high risk for further deterioration and high frequency of surveillance required.*    Annalynn Centanni M.  DO [facility]  How to contact the [facility] Attending or Consulting provider [place] or covering provider during after hours [place], for this patient?  1. Check the care team in [facility] and look for a) attending/consulting [facility] provider listed and b) the [facility] team listed 2. Log into [email or web address]  Amion Physician Scheduling and messaging for groups and whole hospitals  On call and physician scheduling software for group practices, residents, hospitalists and other medical providers for call, clinic, rotation and shift schedules. On[employer] is a hospital-wide system for scheduling doctors and paging doctors on call. EasyPlot is for scientific plotting and data analysis.  [email or web address]  and use [facility]'s universal password to access. If you do not have the password, please contact the hospital operator.  3. Locate the [facility] provider you are looking for under [facility] and page to a  number that you can be directly reached. 4. If you still have difficulty reaching the provider, please page the [facility] (Director on Call) for the Hospitalists listed on amion for assistance.  07/16/2019, 8:21 PM

## 2019-07-16 NOTE — ED Notes (Signed)
Pt refuses chest tube and requested to hold until after transfer to [facility] where she had it done 2 months ago.

## 2019-07-16 NOTE — Progress Notes (Signed)
Brief Note Known to our service. Okay for ER doctor to put in chest tube and admit to [facility] service. We can help manage chest tube. Please reach out when she is here.  Erskine Emery MD PCCM

## 2019-07-17 ENCOUNTER — Encounter (HOSPITAL_COMMUNITY): Payer: Self-pay | Admitting: Internal Medicine

## 2019-07-17 ENCOUNTER — Inpatient Hospital Stay (HOSPITAL_COMMUNITY): Payer: 59

## 2019-07-17 DIAGNOSIS — J9601 Acute respiratory failure with hypoxia: Secondary | ICD-10-CM

## 2019-07-17 DIAGNOSIS — J9611 Chronic respiratory failure with hypoxia: Secondary | ICD-10-CM

## 2019-07-17 DIAGNOSIS — J849 Interstitial pulmonary disease, unspecified: Secondary | ICD-10-CM

## 2019-07-17 DIAGNOSIS — J9383 Other pneumothorax: Secondary | ICD-10-CM

## 2019-07-17 LAB — BASIC METABOLIC PANEL
Anion gap: 7 (ref 5–15)
BUN: 16 mg/dL (ref 8–23)
CO2: 29 mmol/L (ref 22–32)
Calcium: 9.4 mg/dL (ref 8.9–10.3)
Chloride: 102 mmol/L (ref 98–111)
Creatinine, Ser: 0.74 mg/dL (ref 0.44–1.00)
GFR calc Af Amer: >60 mL/min (ref >60)
GFR calc non Af Amer: >60 mL/min (ref >60)
Glucose, Bld: 94 mg/dL (ref 70–99)
Potassium: 4.1 mmol/L (ref 3.5–5.1)
Sodium: 138 mmol/L (ref 135–145)

## 2019-07-17 LAB — CBC
HCT: 38.9 % (ref 36.0–46.0)
Hemoglobin: 11.8 g/dL — ABNORMAL LOW (ref 12.0–15.0)
MCH: 28 pg (ref 26.0–34.0)
MCHC: 30.3 g/dL (ref 30.0–36.0)
MCV: 92.2 fL (ref 80.0–100.0)
Platelets: 147 10*3/uL — ABNORMAL LOW (ref 150–400)
RBC: 4.22 MIL/uL (ref 3.87–5.11)
RDW: 14.6 % (ref 11.5–15.5)
WBC: 3.8 10*3/uL — ABNORMAL LOW (ref 4.0–10.5)
nRBC: 0 % (ref 0.0–0.2)

## 2019-07-17 NOTE — Progress Notes (Signed)
Patient ID: Lauren Massey, female   DOB: 1955/04/21, 64 y.o.   MRN: [id number]      [address]       [place] [address]             [phone]                     LOS: 1 day   Subjective: OPERATIVE REPORT DATE OF PROCEDURE: 03/12/2019 PREOPERATIVE DIAGNOSIS: Recurrent right pneumothorax with underlying severe pulmonary sarcoidosis. POSTOPERATIVE DIAGNOSIS: Recurrent right pneumothorax with underlying severe pulmonary sarcoidosis. SURGICAL PROCEDURES:  1. Right robotic-assisted thoracoscopy.  2. Pleurectomy.  3. Pleurodesis. 4. Video bronchoscopy. SURGEON: Lanelle Bal, MD  patient with known severe underlying sarcoid involvement of her lungs who  presented with pneumothorax on the right on several occasions .  On February 2016 2021 she underwent robotic right assisted thoracoscopy with apical pleurectomy and mechanical pleurodesis.  She did well postop, follow-up chest x-ray showed full inflation of her lung as an outpatient.  She returned to work but is careful about heavy exertion.  She was admitted at [facility] yesterday chest x-ray that showed basilar and lateral recurrent pneumothorax with adherence of the apex to the upper chest wall.  She notes that she had a severe episode of coughing prior to the onset of chest discomfort.   Previous CT scan done prior to right chest surgery showed: IMPRESSION: 1. Small partially loculated right pneumothorax, greatest anteriorly. Right chest tube in place. Minimal intraluminal density in the mid and distal chest tube may be debris or hemorrhage. 2. Severe chronic lung disease related to sarcoidosis with bronchiectasis and cystic changes, greatest in the lung bases. 3. Cardiomegaly. Enlarged main pulmonary artery consistent with pulmonary arterial hypertension. 4. Calcified mediastinal, hilar, and retrocrural lymph nodes consistent with sarcoidosis.  Objective: Vital signs in last 24  hours: Patient Vitals for the past 24 hrs:  BP Temp Temp src Pulse Resp SpO2  07/17/19 1651 127/69 98.5 F (36.9 C) -- (!) 56 15 100 %  07/17/19 1351 -- -- -- -- 16 --  07/17/19 1251 -- -- -- -- 16 --  07/17/19 0746 140/79 (!) 97.5 F (36.4 C) -- 60 18 99 %  07/16/19 1941 (!) 164/88 98.3 F (36.8 C) Oral (!) 59 -- 100 %  07/16/19 1815 (!) 165/141 -- -- 62 (!) 22 99 %    Hemodynamic parameters for last 24 hours:    Intake/Output from previous day: No intake/output data recorded. Intake/Output this shift: Total I/O In: 10 [I.V.:10] Out: 8 [Chest Tube:8]  Scheduled Meds: . enoxaparin (LOVENOX) injection  40 mg Subcutaneous Q24H  . folic acid  5 mg Oral Daily  . hydrochlorothiazide  12.5 mg Oral Daily  . lidocaine (PF)  20 mL Infiltration Once  . methotrexate  7.5 mg Oral Weekly  . prednisoLONE acetate  1 drop Right Eye QID  . sodium chloride flush  10 mL Intracatheter Q8H   Continuous Infusions: PRN Meds:.acetaminophen **OR** acetaminophen, albuterol, HYDROmorphone (DILAUDID) injection, ketorolac, ondansetron **OR** ondansetron (ZOFRAN) IV  General appearance: alert, cooperative, appears stated age and no distress Neurologic: intact Heart: regular rate and rhythm, S1, S2 normal, no murmur, click, rub or gallop Lungs: diminished breath sounds bilaterally Abdomen: soft, non-tender; bowel sounds normal; no masses,  no organomegaly Extremities: extremities normal, atraumatic, no cyanosis or edema and Homans sign is negative, no sign of DVT Wound: Right chest tube in place on waterseal no air leak  Lab Results: CBC: Recent Labs    07/16/19 1339 07/17/19 0329  WBC 4.4 3.8*  HGB 12.7 11.8*  HCT 41.7 38.9  PLT 154 147*   BMET:  Recent Labs    07/16/19 1339 07/17/19 0329  NA 137 138  K 3.7 4.1  CL 101 102  CO2 27 29  GLUCOSE 91 94  BUN 18 16  CREATININE 0.78 0.74  CALCIUM 9.2 9.4    PT/INR: No results for input(s): LABPROT, INR in the last 72  hours.   Radiology DG Chest 1 View  Result Date: 07/16/2019 CLINICAL DATA:  Post chest tube placement EXAM: CHEST  1 VIEW COMPARISON:  07/16/2019, 05/02/2019, CT 03/07/2019 FINDINGS: Interval insertion of right-sided chest tube with pigtail projecting over the right lower lateral chest. Decreased size of right pneumothorax with residual right lateral and basilar pneumothorax, pleural-parenchymal separation of 1.4 cm laterally. Chronic lung disease with bronchiectasis and fibrosis. Possible acute atelectasis or mild infiltrates at the lung bases. Stable cardiomediastinal silhouette. IMPRESSION: Interval insertion of right-sided chest tube with decreased size of right pneumothorax. Residual right lateral and basilar pneumothorax as above. Chronic lung disease. Possible superimposed atelectasis or mild infiltrates at the bases. Electronically Signed   By: Donavan Foil M.D.   On: 07/16/2019 17:25   DG CHEST PORT 1 VIEW  Result Date: 07/17/2019 CLINICAL DATA:  Follow-up pneumothorax EXAM: PORTABLE CHEST 1 VIEW COMPARISON:  07/16/2019 FINDINGS: Cardiac shadow is stable. Aortic calcifications are again seen. Chronic fibrotic changes are noted bilaterally and stable. Pigtail catheter is again noted on the right. The previously seen right pneumothorax has resolved in the interval. IMPRESSION: Resolution of right-sided pneumothorax. The remainder of the exam is stable. Electronically Signed   By: Inez Catalina M.D.   On: 07/17/2019 16:26   DG Chest Portable 1 View  Result Date: 07/16/2019 CLINICAL DATA:  Decreased breath sounds on the right following coughing episodes EXAM: PORTABLE CHEST 1 VIEW COMPARISON:  05/02/2019 FINDINGS: Cardiac shadow is stable. Aortic calcifications are again noted. Scattered fibrotic changes are noted in both lungs consistent with the patient's given clinical history of underlying sarcoidosis. There is a new right-sided pneumothorax centered predominately laterally and inferiorly. No  bony abnormality is noted. IMPRESSION: Right-sided pneumothorax as described. Critical Value/emergent results were called by telephone at the time of interpretation on 07/16/2019 at 2:30 pm to Dr. Madalyn Rob , who verbally acknowledged these results. Electronically Signed   By: Inez Catalina M.D.   On: 07/16/2019 14:31   I have independently reviewed the above  cath films and reviewed the findings with the  patient .     Assessment/Plan: Recurrent lateral basilar pneumothorax in spite of apical pleurectomy and pleurodesis done 6 months ago.  The patient has severe underlying cystic lung disease related to sarcoid. Agree with current care with placement of chest tube resolution of pneumothorax, will consider leaving chest tube at least 1-2 more days even without air leak with the lung fully inflated before removal   Grace Isaac MD 07/17/2019 6:14 PM

## 2019-07-17 NOTE — Consult Note (Signed)
Name: Lauren Massey MRN: [id number] DOB: 1955/06/03    ADMISSION DATE:  07/16/2019 CONSULTATION DATE: 07/17/2019  REFERRING MD : [facility]  CHIEF COMPLAINT: Shortness of breath following coughing episode  BRIEF PATIENT DESCRIPTION: Thin female no acute distress following chest tube insertion.  SIGNIFICANT EVENTS  Right chest tube placement 07/16/2019  STUDIES:  Chest x-ray with right pneumothorax subsequent chest x-ray 07/16/2019 with resolution of his pneumothorax with placement of right chest tube.   HISTORY OF PRESENT ILLNESS:   64 year old female with history of sarcoid, interstitial lung disease, pneumothoraces x2 status post robotic thorascopic intervention was pleurodesis in February 2021 per cardiovascular thoracic surgeon Dr. Servando Snare.  She is followed by the pulmonary office and called the office on 07/16/2019 with shortness of breath following a coughing episode.  Due to her history of recurrent pneumothoraces in the setting of interstitial lung disease and sarcoid she was instructed to go to the emergency department for further evaluation and treatment.  In the emergency department chest x-ray revealed a right pneumothorax.  She had a right pigtail catheter placed by the emergency department physician.  She was then transferred to [facility] to the [facility] hospitalist service pulmonary critical care asked to consult.  Cardiovascular thoracic surgery has been consulted.  Pulmonary critical care has been asked to manage the chest tube while she is being admitted at this time.  Note after last discharge she did require nocturnal oxygen at 2 L.  Currently she is on oxygen in no acute distress no respiratory distress she does have a right pigtail catheter in place that has an obvious air leak with coughing and deep breathing.  Chest x-ray is reviewed shows resolution of the right pneumothorax and proper positioning of the right pigtail catheter.  PAST MEDICAL HISTORY :   has a  past medical history of Cataract, Pneumothorax, Sarcoidosis, and Unspecified iridocyclitis.  has a past surgical history that includes Bronchoscopy (2003); Vesicovaginal fistula closure w/ TAH; Appendectomy; Abdominal hysterectomy; Cataract extraction; Video bronchoscopy (N/A, 03/12/2019); and Intercostal nerve block (Right, 03/12/2019). Prior to Admission medications   Medication Sig Start Date End Date Taking? Authorizing Provider  albuterol (VENTOLIN HFA) 108 (90 Base) MCG/ACT inhaler Inhale 2 puffs into the lungs every 6 (six) hours as needed for wheezing or shortness of breath. 02/08/19  Yes Young, Tarri Fuller D, MD  folic acid (FOLVITE) 1 MG tablet Take 5 mg by mouth at bedtime.    Yes [provider]  hydrochlorothiazide (MICROZIDE) 12.5 MG capsule Take 1 capsule (12.5 mg total) by mouth daily. 04/12/19  Yes Hoyt Koch, MD  methotrexate (RHEUMATREX) 2.5 MG tablet Take 7.5 mg by mouth once a week. Caution:Chemotherapy. Protect from light.  Pt takes 3 tablets every Wednesday.   Yes [provider]  prednisoLONE acetate (PRED FORTE) 1 % ophthalmic suspension Place 1 drop into the right eye 2 (two) times daily. Reported on 04/16/2015 Patient taking differently: Place 1 drop into the right eye 4 (four) times daily. Reported on 04/16/2015 04/16/15  Yes Robyn Haber, MD   No Known Allergies  FAMILY HISTORY:  family history includes Alzheimer's disease in her mother; Cancer in her father; Diabetes in an other family member; Heart disease in her father. SOCIAL HISTORY:  reports that she has never smoked. She has never used smokeless tobacco. She reports that she does not drink alcohol and does not use drugs.  REVIEW OF SYSTEMS:   10 point review of system taken, please see HPI for positives and negatives.  SUBJECTIVE:  64 year old female is resting comfortably in bed right chest tube with air leak no acute distress. VITAL SIGNS: Temp:  [97.5 F (36.4 C)-99.6 F (37.6  C)] 97.5 F (36.4 C) (06/23 0746) Pulse Rate:  [58-89] 60 (06/23 0746) Resp:  [14-32] 18 (06/23 0746) BP: (132-165)/(78-141) 140/79 (06/23 0746) SpO2:  [73 %-100 %] 99 % (06/23 0746)  PHYSICAL EXAMINATION: General: Thin female is in no acute distress on 2 L nasal cannula Neuro: Grossly intact no focal defects alert orientated carries on a conversation HEENT: Poor dentition, no JVD or lymphadenopathy is appreciated Cardiovascular: Heart sounds are regular regular rate and rhythm Lungs: Right chest tube is in place noted to have an air leak O2 sats 100% on 2 L nasal cannula.  No complaints of chest pain or chest discomfort. Abdomen: Soft nontender positive bowel sounds Musculoskeletal: Grossly intact no deformities Skin: Warm dry  Recent Labs  Lab 07/16/19 1339 07/17/19 0329  NA 137 138  K 3.7 4.1  CL 101 102  CO2 27 29  BUN 18 16  CREATININE 0.78 0.74  GLUCOSE 91 94   Recent Labs  Lab 07/16/19 1339 07/17/19 0329  HGB 12.7 11.8*  HCT 41.7 38.9  WBC 4.4 3.8*  PLT 154 147*   DG Chest 1 View  Result Date: 07/16/2019 CLINICAL DATA:  Post chest tube placement EXAM: CHEST  1 VIEW COMPARISON:  07/16/2019, 05/02/2019, CT 03/07/2019 FINDINGS: Interval insertion of right-sided chest tube with pigtail projecting over the right lower lateral chest. Decreased size of right pneumothorax with residual right lateral and basilar pneumothorax, pleural-parenchymal separation of 1.4 cm laterally. Chronic lung disease with bronchiectasis and fibrosis. Possible acute atelectasis or mild infiltrates at the lung bases. Stable cardiomediastinal silhouette. IMPRESSION: Interval insertion of right-sided chest tube with decreased size of right pneumothorax. Residual right lateral and basilar pneumothorax as above. Chronic lung disease. Possible superimposed atelectasis or mild infiltrates at the bases. Electronically Signed   By: Donavan Foil M.D.   On: 07/16/2019 17:25   DG Chest Portable 1  View  Result Date: 07/16/2019 CLINICAL DATA:  Decreased breath sounds on the right following coughing episodes EXAM: PORTABLE CHEST 1 VIEW COMPARISON:  05/02/2019 FINDINGS: Cardiac shadow is stable. Aortic calcifications are again noted. Scattered fibrotic changes are noted in both lungs consistent with the patient's given clinical history of underlying sarcoidosis. There is a new right-sided pneumothorax centered predominately laterally and inferiorly. No bony abnormality is noted. IMPRESSION: Right-sided pneumothorax as described. Critical Value/emergent results were called by telephone at the time of interpretation on 07/16/2019 at 2:30 pm to Dr. Madalyn Rob , who verbally acknowledged these results. Electronically Signed   By: Inez Catalina M.D.   On: 07/16/2019 14:31    ASSESSMENT:   Pneumothorax recurrent   SARCOIDOSIS, PULMONARY   Chronic respiratory failure with hypoxia (HCC)   Essential hypertension   ILD (interstitial lung disease) [facility])  Discussion: 64 year old female with history of sarcoid, interstitial lung disease, pneumothoraces x2 status post robotic thorascopic intervention was pleurodesis in February 2021 per cardiovascular thoracic surgeon Dr. Servando Snare.  She is followed by the pulmonary office and called the office on 07/16/2019 with shortness of breath following a coughing episode.  Due to her history of recurrent pneumothoraces in the setting of interstitial lung disease and sarcoid she was instructed to go to the emergency department for further evaluation and treatment.  In the emergency department chest x-ray revealed a right pneumothorax.  She had a right pigtail catheter placed by  the emergency department physician.  She was then transferred to [facility] to the [facility] hospitalist service pulmonary critical care asked to consult.  Cardiovascular thoracic surgery has been consulted.  Pulmonary critical care has been asked to manage the chest tube while she is being  admitted at this time.  Note after last discharge she did require nocturnal oxygen at 2 L.  Currently she is on oxygen in no acute distress no respiratory distress she does have a right pigtail catheter in place that has an obvious air leak with coughing and deep breathing.  Chest x-ray is reviewed shows resolution of the right pneumothorax and proper positioning of the right pigtail catheter.    PLAN: Continue right chest tube to 20 cm suction. Further interventions per cardiovascular thoracic surgery.  This may occur return to the operating room versus temporary Heimlich valve. Pulmonary will follow while chest tube is in place.    Richardson Landry Zahrah Sutherlin ACNP Acute Care Nurse Practitioner [place] Please consult Amion 07/17/2019, 8:00 AM

## 2019-07-17 NOTE — Progress Notes (Signed)
PROGRESS NOTE  Lauren Massey VXB:[id number] DOB: 09/01/1955 DOA: 07/16/2019 PCP: Hoyt Koch, MD  HPI/Recap of past 8 hours: 64 year old female with past medical history of pulmonary sarcoidosis and recurrent secondary pneumothorax who in February underwent robotic thoracoscopy pleurodesis and pleurectomy by CVTS found to have recurrent pneumothorax again on 6/22.  Presented to the emergency room and chest tube placed in the emergency room.  Transferred to [facility] to hospitalist service.  Pulmonary consulted for chest tube management.  CVTS consulted as well.  Assessment/Plan: Principal Problem:   Pneumothorax: Status post chest tube placement.  Chest x-ray notes resolution of pneumothorax.  Awaiting follow-up from cardiothoracic surgery.  Pulmonary following. Active Problems:   SARCOIDOSIS, PULMONARY: Continue methotrexate   Chronic respiratory failure with hypoxia ([place]): Stable, continue oxygen, patient breathing comfortably   Essential hypertension: Blood pressure stable, continue home medications.   ILD (interstitial lung disease) ([place])   Code Status: Full code  Family Communication: Daughter at bedside  Disposition Plan: Home once cleared by cardiothoracic surgery   Consultants:  CVTS  Critical care  Procedures:  Status post chest tube placement done in emergency room 6/22  Antimicrobials:  None  DVT prophylaxis: Lovenox   Objective: Vitals:   07/17/19 1251 07/17/19 1651  BP:  127/69  Pulse:  (!) 56  Resp: 16 15  Temp:  98.5 F (36.9 C)  SpO2:  100%    Intake/Output Summary (Last 24 hours) at 07/17/2019 1710 Last data filed at 07/17/2019 0749 Gross per 24 hour  Intake --  Output 8 ml  Net -8 ml   There were no vitals filed for this visit. There is no height or weight on file to calculate BMI.  Exam:   General: Alert and oriented x3, no acute distress  Cardiovascular: Regular rate and rhythm, S1-S2  Respiratory: Clear  to auscultation bilaterally  Abdomen: Soft, nontender, nondistended, positive bowel sounds  Musculoskeletal: No clubbing cyanosis or edema  Skin: No skin breaks, tears or lesions other than a chest tube placement site  Psychiatry: Appropriate, no evidence of psychoses   Data Reviewed: CBC: Recent Labs  Lab 07/16/19 1339 07/17/19 0329  WBC 4.4 3.8*  NEUTROABS 2.7  --   HGB 12.7 11.8*  HCT 41.7 38.9  MCV 91.2 92.2  PLT 154 330*   Basic Metabolic Panel: Recent Labs  Lab 07/16/19 1339 07/17/19 0329  NA 137 138  K 3.7 4.1  CL 101 102  CO2 27 29  GLUCOSE 91 94  BUN 18 16  CREATININE 0.78 0.74  CALCIUM 9.2 9.4   GFR: CrCl cannot be calculated (Unknown ideal weight.). Liver Function Tests: No results for input(s): AST, ALT, ALKPHOS, BILITOT, PROT, ALBUMIN in the last 168 hours. No results for input(s): LIPASE, AMYLASE in the last 168 hours. No results for input(s): AMMONIA in the last 168 hours. Coagulation Profile: No results for input(s): INR, PROTIME in the last 168 hours. Cardiac Enzymes: No results for input(s): CKTOTAL, CKMB, CKMBINDEX, TROPONINI in the last 168 hours. BNP (last 3 results) No results for input(s): PROBNP in the last 8760 hours. HbA1C: No results for input(s): HGBA1C in the last 72 hours. CBG: No results for input(s): GLUCAP in the last 168 hours. Lipid Profile: No results for input(s): CHOL, HDL, LDLCALC, TRIG, CHOLHDL, LDLDIRECT in the last 72 hours. Thyroid Function Tests: No results for input(s): TSH, T4TOTAL, FREET4, T3FREE, THYROIDAB in the last 72 hours. Anemia Panel: No results for input(s): VITAMINB12, FOLATE, FERRITIN, TIBC, IRON, RETICCTPCT  in the last 72 hours. Urine analysis:    Component Value Date/Time   COLORURINE AMBER (A) 04/24/2015 1116   APPEARANCEUR CLOUDY (A) 04/24/2015 1116   LABSPEC 1.016 04/24/2015 1116   PHURINE 6.5 04/24/2015 1116   GLUCOSEU NEGATIVE 04/24/2015 1116   HGBUR TRACE (A) 04/24/2015 1116    BILIRUBINUR negative 11/02/2017 1047   KETONESUR NEGATIVE 04/24/2015 1116   PROTEINUR Negative 11/02/2017 1047   PROTEINUR 30 (A) 04/24/2015 1116   UROBILINOGEN 0.2 11/02/2017 1047   NITRITE negative 11/02/2017 1047   NITRITE NEGATIVE 04/24/2015 1116   LEUKOCYTESUR Large (3+) (A) 11/02/2017 1047   Sepsis Labs: @LABRCNTIP (procalcitonin:4,lacticidven:4)  ) Recent Results (from the past 240 hour(s))  SARS Coronavirus 2 by RT PCR (hospital order, performed in [place] hospital lab) Nasopharyngeal Nasopharyngeal Swab     Status: None   Collection Time: 07/16/19  2:03 PM   Specimen: Nasopharyngeal Swab  Result Value Ref Range Status   SARS Coronavirus 2 NEGATIVE NEGATIVE Final    Comment: (NOTE) SARS-CoV-2 target nucleic acids are NOT DETECTED.  The SARS-CoV-2 RNA is generally detectable in upper and lower respiratory specimens during the acute phase of infection. The lowest concentration of SARS-CoV-2 viral copies this assay can detect is 250 copies / mL. A negative result does not preclude SARS-CoV-2 infection and should not be used as the sole basis for treatment or other patient management decisions.  A negative result may occur with improper specimen collection / handling, submission of specimen other than nasopharyngeal swab, presence of viral mutation(s) within the areas targeted by this assay, and inadequate number of viral copies (<250 copies / mL). A negative result must be combined with clinical observations, patient history, and epidemiological information.  Fact Sheet for Patients:   StrictlyIdeas.no  Fact Sheet for Healthcare Providers: BankingDealers.co.za  This test is not yet approved or  cleared by the [place] FDA and has been authorized for detection and/or diagnosis of SARS-CoV-2 by FDA under an Emergency Use Authorization (EUA).  This EUA will remain in effect (meaning this test can be used) for the  duration of the COVID-19 declaration under Section 564(b)(1) of the Act, 21 U.S.C. section 360bbb-3(b)(1), unless the authorization is terminated or revoked sooner.  Performed at [facility], [address] [place], [place] [address]       Studies: DG CHEST PORT 1 VIEW  Result Date: 07/17/2019 CLINICAL DATA:  Follow-up pneumothorax EXAM: PORTABLE CHEST 1 VIEW COMPARISON:  07/16/2019 FINDINGS: Cardiac shadow is stable. Aortic calcifications are again seen. Chronic fibrotic changes are noted bilaterally and stable. Pigtail catheter is again noted on the right. The previously seen right pneumothorax has resolved in the interval. IMPRESSION: Resolution of right-sided pneumothorax. The remainder of the exam is stable. Electronically Signed   By: Inez Catalina M.D.   On: 07/17/2019 16:26    Scheduled Meds: . enoxaparin (LOVENOX) injection  40 mg Subcutaneous Q24H  . folic acid  5 mg Oral Daily  . hydrochlorothiazide  12.5 mg Oral Daily  . lidocaine (PF)  20 mL Infiltration Once  . methotrexate  7.5 mg Oral Weekly  . prednisoLONE acetate  1 drop Right Eye QID  . sodium chloride flush  10 mL Intracatheter Q8H    Continuous Infusions:   LOS: 1 day     Annita Brod, MD [facility]   07/17/2019, 5:10 PM

## 2019-07-18 ENCOUNTER — Inpatient Hospital Stay (HOSPITAL_COMMUNITY): Payer: 59

## 2019-07-18 DIAGNOSIS — D869 Sarcoidosis, unspecified: Secondary | ICD-10-CM

## 2019-07-18 DIAGNOSIS — J9312 Secondary spontaneous pneumothorax: Secondary | ICD-10-CM

## 2019-07-18 DIAGNOSIS — J9601 Acute respiratory failure with hypoxia: Secondary | ICD-10-CM

## 2019-07-18 DIAGNOSIS — J849 Interstitial pulmonary disease, unspecified: Secondary | ICD-10-CM

## 2019-07-18 DIAGNOSIS — J939 Pneumothorax, unspecified: Secondary | ICD-10-CM

## 2019-07-18 NOTE — Progress Notes (Signed)
PROGRESS NOTE  Lauren Massey RSW:[id number] DOB: 07/25/1955 DOA: 07/16/2019 PCP: Hoyt Koch, MD  HPI/Recap of past 64 hours: 64 year old female with past medical history of pulmonary sarcoidosis and recurrent secondary pneumothorax who in February underwent robotic thoracoscopy pleurodesis and pleurectomy by CVTS found to have recurrent pneumothorax again on 6/22.  Presented to the emergency room and chest tube placed in the emergency room.  Transferred to [facility] to hospitalist service.  Pulmonary consulted for chest tube management.  CVTS also following patient.  Today, patient is feeling okay.  Mild pain with movement, but not with deep inspiration.  Chest x-ray noting no evidence of pneumothorax.  Seen by CVTS who recommended keeping chest tube in for 1 more day.  Assessment/Plan: Principal Problem:   Pneumothorax: Status post chest tube placement.  Chest x-ray notes resolution of pneumothorax.  Keep chest tube in for 1 additional day as per cardiothoracic surgery.  Pulmonary following. Active Problems:   SARCOIDOSIS, PULMONARY: Continue methotrexate    Chronic respiratory failure with hypoxia ([place]): Stable, continue oxygen, patient breathing comfortably    Essential hypertension: Blood pressure stable, continue home medications.   ILD (interstitial lung disease) ([place])   Code Status: Full code  Family Communication: Spoke with daughter in person on 6/23  Disposition Plan: Home once cleared by cardiothoracic surgery, possibly as early as tomorrow   Consultants:  CVTS  Critical care  Procedures:  Status post chest tube placement done in emergency room 6/22  Antimicrobials:  None  DVT prophylaxis: Lovenox   Objective: Vitals:   07/18/19 0800 07/18/19 1558  BP:  130/86  Pulse: 65 66  Resp:  16  Temp:  97.7 F (36.5 C)  SpO2:  100%    Intake/Output Summary (Last 24 hours) at 07/18/2019 1645 Last data filed at 07/18/2019 1356 Gross  per 24 hour  Intake 20 ml  Output 12 ml  Net 8 ml   There were no vitals filed for this visit. There is no height or weight on file to calculate BMI.  Exam:   General: Alert and oriented x3, no acute distress  Cardiovascular: Regular rate and rhythm, S1-S2  Respiratory: Clear to auscultation bilaterally.  Chest tube noted in place on right side  Abdomen: Soft, nontender, nondistended, positive bowel sounds  Musculoskeletal: No clubbing cyanosis or edema  Skin: No skin breaks, tears or lesions other than a chest tube placement site  Psychiatry: Appropriate, no evidence of psychoses   Data Reviewed: CBC: Recent Labs  Lab 07/16/19 1339 07/17/19 0329  WBC 4.4 3.8*  NEUTROABS 2.7  --   HGB 12.7 11.8*  HCT 41.7 38.9  MCV 91.2 92.2  PLT 154 093*   Basic Metabolic Panel: Recent Labs  Lab 07/16/19 1339 07/17/19 0329  NA 137 138  K 3.7 4.1  CL 101 102  CO2 27 29  GLUCOSE 91 94  BUN 18 16  CREATININE 0.78 0.74  CALCIUM 9.2 9.4   GFR: CrCl cannot be calculated (Unknown ideal weight.). Liver Function Tests: No results for input(s): AST, ALT, ALKPHOS, BILITOT, PROT, ALBUMIN in the last 168 hours. No results for input(s): LIPASE, AMYLASE in the last 168 hours. No results for input(s): AMMONIA in the last 168 hours. Coagulation Profile: No results for input(s): INR, PROTIME in the last 168 hours. Cardiac Enzymes: No results for input(s): CKTOTAL, CKMB, CKMBINDEX, TROPONINI in the last 168 hours. BNP (last 3 results) No results for input(s): PROBNP in the last 8760 hours. HbA1C: No results  for input(s): HGBA1C in the last 72 hours. CBG: No results for input(s): GLUCAP in the last 168 hours. Lipid Profile: No results for input(s): CHOL, HDL, LDLCALC, TRIG, CHOLHDL, LDLDIRECT in the last 72 hours. Thyroid Function Tests: No results for input(s): TSH, T4TOTAL, FREET4, T3FREE, THYROIDAB in the last 72 hours. Anemia Panel: No results for input(s): VITAMINB12,  FOLATE, FERRITIN, TIBC, IRON, RETICCTPCT in the last 72 hours. Urine analysis:    Component Value Date/Time   COLORURINE AMBER (A) 04/24/2015 1116   APPEARANCEUR CLOUDY (A) 04/24/2015 1116   LABSPEC 1.016 04/24/2015 1116   PHURINE 6.5 04/24/2015 1116   GLUCOSEU NEGATIVE 04/24/2015 1116   HGBUR TRACE (A) 04/24/2015 1116   BILIRUBINUR negative 11/02/2017 1047   KETONESUR NEGATIVE 04/24/2015 1116   PROTEINUR Negative 11/02/2017 1047   PROTEINUR 30 (A) 04/24/2015 1116   UROBILINOGEN 0.2 11/02/2017 1047   NITRITE negative 11/02/2017 1047   NITRITE NEGATIVE 04/24/2015 1116   LEUKOCYTESUR Large (3+) (A) 11/02/2017 1047   Sepsis Labs: @LABRCNTIP (procalcitonin:4,lacticidven:4)  ) Recent Results (from the past 240 hour(s))  SARS Coronavirus 2 by RT PCR (hospital order, performed in [place] hospital lab) Nasopharyngeal Nasopharyngeal Swab     Status: None   Collection Time: 07/16/19  2:03 PM   Specimen: Nasopharyngeal Swab  Result Value Ref Range Status   SARS Coronavirus 2 NEGATIVE NEGATIVE Final    Comment: (NOTE) SARS-CoV-2 target nucleic acids are NOT DETECTED.  The SARS-CoV-2 RNA is generally detectable in upper and lower respiratory specimens during the acute phase of infection. The lowest concentration of SARS-CoV-2 viral copies this assay can detect is 250 copies / mL. A negative result does not preclude SARS-CoV-2 infection and should not be used as the sole basis for treatment or other patient management decisions.  A negative result may occur with improper specimen collection / handling, submission of specimen other than nasopharyngeal swab, presence of viral mutation(s) within the areas targeted by this assay, and inadequate number of viral copies (<250 copies / mL). A negative result must be combined with clinical observations, patient history, and epidemiological information.  Fact Sheet for Patients:   StrictlyIdeas.no  Fact Sheet for  Healthcare Providers: BankingDealers.co.za  This test is not yet approved or  cleared by the [place] FDA and has been authorized for detection and/or diagnosis of SARS-CoV-2 by FDA under an Emergency Use Authorization (EUA).  This EUA will remain in effect (meaning this test can be used) for the duration of the COVID-19 declaration under Section 564(b)(1) of the Act, 21 U.S.C. section 360bbb-3(b)(1), unless the authorization is terminated or revoked sooner.  Performed at [facility], [address] [place], [place] [address]       Studies: DG CHEST PORT 1 VIEW  Result Date: 07/18/2019 CLINICAL DATA:  Follow-up chest tube EXAM: PORTABLE CHEST 1 VIEW COMPARISON:  07/17/2019 FINDINGS: Right pigtail catheter is again identified and stable. No definitive pneumothorax is seen. Stable fibrotic changes are again identified. Some improved aeration in the left base is seen. No new focal abnormality is noted. IMPRESSION: Slight improved aeration in the left base. No pneumothorax is identified. Chest tube is noted in satisfactory position. Electronically Signed   By: Inez Catalina M.D.   On: 07/18/2019 08:06    Scheduled Meds: . enoxaparin (LOVENOX) injection  40 mg Subcutaneous Q24H  . folic acid  5 mg Oral Daily  . hydrochlorothiazide  12.5 mg Oral Daily  . lidocaine (PF)  20 mL Infiltration Once  .  methotrexate  7.5 mg Oral Weekly  . prednisoLONE acetate  1 drop Right Eye QID  . sodium chloride flush  10 mL Intracatheter Q8H    Continuous Infusions:   LOS: 2 days     Annita Brod, MD [facility]   07/18/2019, 4:45 PM

## 2019-07-18 NOTE — Progress Notes (Addendum)
       [address]       [place] [address]             [phone]       Subjective: Sitting up in the bedside chair. Denies pain or shortness of breath at rest.   Objective: Vital signs in last 24 hours: Temp:  [97.9 F (36.6 C)-98.5 F (36.9 C)] 97.9 F (36.6 C) (06/23 2037) Pulse Rate:  [45-65] 65 (06/24 0800) Cardiac Rhythm: Sinus bradycardia (06/24 0755) Resp:  [15-16] 16 (06/24 0741) BP: (127-148)/(69-86) 148/86 (06/24 0741) SpO2:  [100 %] 100 % (06/24 0741)     Intake/Output from previous day: 06/23 0701 - 06/24 0700 In: 10 [I.V.:10] Out: 20 [Chest Tube:20] Intake/Output this shift: No intake/output data recorded.  General appearance: alert, cooperative and no distress Lungs: Breath sounds clear, diminished. No air leak, CT on water seal.  Wound: CT site dry,    EXAM: PORTABLE CHEST 1 VIEW  COMPARISON:  07/17/2019  FINDINGS: Right pigtail catheter is again identified and stable. No definitive pneumothorax is seen. Stable fibrotic changes are again identified. Some improved aeration in the left base is seen. No new focal abnormality is noted.  IMPRESSION: Slight improved aeration in the left base.  No pneumothorax is identified. Chest tube is noted in satisfactory position.   Electronically Signed   By: Inez Catalina M.D.   On: 07/18/2019 08:06   Lab Results: Recent Labs    07/16/19 1339 07/17/19 0329  WBC 4.4 3.8*  HGB 12.7 11.8*  HCT 41.7 38.9  PLT 154 147*   BMET:  Recent Labs    07/16/19 1339 07/17/19 0329  NA 137 138  K 3.7 4.1  CL 101 102  CO2 27 29  GLUCOSE 91 94  BUN 18 16  CREATININE 0.78 0.74  CALCIUM 9.2 9.4    PT/INR: No results for input(s): LABPROT, INR in the last 72 hours. ABG No results found for: PHART, HCO3, TCO2, ACIDBASEDEF, O2SAT CBG (last 3)  No results for input(s): GLUCAP in the last 72 hours.  Assessment/Plan:  69yof with history of severe pulmonary sarcoidosis with  multiple pulmonary cysts admitted with recurrent right PTX despite right pleurectomy and mechanical pleurodesis in February of this year. PTX has resolved with placement of a pigtail catheter. No air leak and catheter is currently on water seal. Agree with leaving the tube in place for at least another 24 hours.  We will continue to follow.     LOS: 2 days    Antony Odea, [place] [phone] 07/18/2019  Patient seen this am, no air leak- leave ct at least another hours. I have seen and examined Lauren Massey and agree with the above assessment  and plan.  Grace Isaac MD [place] [phone] Office [phone] 07/18/2019 4:42 PM

## 2019-07-18 NOTE — Progress Notes (Signed)
Name: Lauren Massey MRN: [id number] DOB: Jun 19, 1955    ADMISSION DATE:  07/16/2019 CONSULTATION DATE: 07/17/2019  REFERRING MD : [facility]  CHIEF COMPLAINT: Shortness of breath following coughing episode  BRIEF PATIENT DESCRIPTION: Thin female no acute distress following chest tube insertion.  SIGNIFICANT EVENTS  Right chest tube placement 07/16/2019  STUDIES:  Chest x-ray with right pneumothorax subsequent chest x-ray 07/16/2019 with resolution of his pneumothorax with placement of right chest tube.   HISTORY OF PRESENT ILLNESS:   63 year old female with history of sarcoid, interstitial lung disease, pneumothoraces x2 status post robotic thorascopic intervention was pleurodesis in February 2021 per cardiovascular thoracic surgeon Dr. Servando Snare.  She is followed by the pulmonary office and called the office on 07/16/2019 with shortness of breath following a coughing episode.  Due to her history of recurrent pneumothoraces in the setting of interstitial lung disease and sarcoid she was instructed to go to the emergency department for further evaluation and treatment.  In the emergency department chest x-ray revealed a right pneumothorax.  She had a right pigtail catheter placed by the emergency department physician.  She was then transferred to [facility] to the [facility] hospitalist service pulmonary critical care asked to consult.  Cardiovascular thoracic surgery has been consulted.  Pulmonary critical care has been asked to manage the chest tube while she is being admitted at this time.  Note after last discharge she did require nocturnal oxygen at 2 L.  Currently she is on oxygen in no acute distress no respiratory distress she does have a right pigtail catheter in place that has an obvious air leak with coughing and deep breathing.  Chest x-ray is reviewed shows resolution of the right pneumothorax and proper positioning of the right pigtail catheter.   SUBJECTIVE:  No acute  distress on waterseal for chest tube VITAL SIGNS: Temp:  [97.9 F (36.6 C)-98.5 F (36.9 C)] 97.9 F (36.6 C) (06/23 2037) Pulse Rate:  [45-65] 65 (06/24 0800) Resp:  [15-16] 16 (06/24 0741) BP: (127-148)/(69-86) 148/86 (06/24 0741) SpO2:  [100 %] 100 % (06/24 0741)  PHYSICAL EXAMINATION: Thin female no acute distress he is sitting on side of the bed. No JVD lymphadenopathy is appreciated Right chest tube to waterseal without any air leak Heart sounds are regular Abdomen soft nontender Extremities warm and dry  Recent Labs  Lab 07/16/19 1339 07/17/19 0329  NA 137 138  K 3.7 4.1  CL 101 102  CO2 27 29  BUN 18 16  CREATININE 0.78 0.74  GLUCOSE 91 94   Recent Labs  Lab 07/16/19 1339 07/17/19 0329  HGB 12.7 11.8*  HCT 41.7 38.9  WBC 4.4 3.8*  PLT 154 147*   DG Chest 1 View  Result Date: 07/16/2019 CLINICAL DATA:  Post chest tube placement EXAM: CHEST  1 VIEW COMPARISON:  07/16/2019, 05/02/2019, CT 03/07/2019 FINDINGS: Interval insertion of right-sided chest tube with pigtail projecting over the right lower lateral chest. Decreased size of right pneumothorax with residual right lateral and basilar pneumothorax, pleural-parenchymal separation of 1.4 cm laterally. Chronic lung disease with bronchiectasis and fibrosis. Possible acute atelectasis or mild infiltrates at the lung bases. Stable cardiomediastinal silhouette. IMPRESSION: Interval insertion of right-sided chest tube with decreased size of right pneumothorax. Residual right lateral and basilar pneumothorax as above. Chronic lung disease. Possible superimposed atelectasis or mild infiltrates at the bases. Electronically Signed   By: Donavan Foil M.D.   On: 07/16/2019 17:25   DG CHEST PORT 1 VIEW  Result  Date: 07/18/2019 CLINICAL DATA:  Follow-up chest tube EXAM: PORTABLE CHEST 1 VIEW COMPARISON:  07/17/2019 FINDINGS: Right pigtail catheter is again identified and stable. No definitive pneumothorax is seen. Stable  fibrotic changes are again identified. Some improved aeration in the left base is seen. No new focal abnormality is noted. IMPRESSION: Slight improved aeration in the left base. No pneumothorax is identified. Chest tube is noted in satisfactory position. Electronically Signed   By: Inez Catalina M.D.   On: 07/18/2019 08:06   DG CHEST PORT 1 VIEW  Result Date: 07/17/2019 CLINICAL DATA:  Follow-up pneumothorax EXAM: PORTABLE CHEST 1 VIEW COMPARISON:  07/16/2019 FINDINGS: Cardiac shadow is stable. Aortic calcifications are again seen. Chronic fibrotic changes are noted bilaterally and stable. Pigtail catheter is again noted on the right. The previously seen right pneumothorax has resolved in the interval. IMPRESSION: Resolution of right-sided pneumothorax. The remainder of the exam is stable. Electronically Signed   By: Inez Catalina M.D.   On: 07/17/2019 16:26   DG Chest Portable 1 View  Result Date: 07/16/2019 CLINICAL DATA:  Decreased breath sounds on the right following coughing episodes EXAM: PORTABLE CHEST 1 VIEW COMPARISON:  05/02/2019 FINDINGS: Cardiac shadow is stable. Aortic calcifications are again noted. Scattered fibrotic changes are noted in both lungs consistent with the patient's given clinical history of underlying sarcoidosis. There is a new right-sided pneumothorax centered predominately laterally and inferiorly. No bony abnormality is noted. IMPRESSION: Right-sided pneumothorax as described. Critical Value/emergent results were called by telephone at the time of interpretation on 07/16/2019 at 2:30 pm to Dr. Madalyn Rob , who verbally acknowledged these results. Electronically Signed   By: Inez Catalina M.D.   On: 07/16/2019 14:31    ASSESSMENT:   Pneumothorax recurrent   SARCOIDOSIS, PULMONARY   Chronic respiratory failure with hypoxia (HCC)   Essential hypertension   ILD (interstitial lung disease) [facility])  Discussion: 64 year old female with history of sarcoid, interstitial lung  disease, pneumothoraces x2 status post robotic thorascopic intervention was pleurodesis in February 2021 per cardiovascular thoracic surgeon Dr. Servando Snare.  She is followed by the pulmonary office and called the office on 07/16/2019 with shortness of breath following a coughing episode.  Due to her history of recurrent pneumothoraces in the setting of interstitial lung disease and sarcoid she was instructed to go to the emergency department for further evaluation and treatment.  In the emergency department chest x-ray revealed a right pneumothorax.  She had a right pigtail catheter placed by the emergency department physician.  She was then transferred to [facility] to the [facility] hospitalist service pulmonary critical care asked to consult.  Cardiovascular thoracic surgery has been consulted.  Pulmonary critical care has been asked to manage the chest tube while she is being admitted at this time.  Note after last discharge she did require nocturnal oxygen at 2 L.  Currently she is on oxygen in no acute distress no respiratory distress she does have a right pigtail catheter in place that has an obvious air leak with coughing and deep breathing.  Chest x-ray is reviewed shows resolution of the right pneumothorax and proper positioning of the right pigtail catheter. Questionable pleurodesis this admission    PLAN:  Continue right chest tube to waterseal for least 24 more hours to 07/19/2019 Consider some type of pleurodesis either with doxycycline or talc in the setting of multiple recurrent pneumothoraces. Appreciate cardiovascular thoracic surgery's input Pulmonary will continue to follow a chest tube is in place Continue right chest  tube to 20 cm suction.    Richardson Landry Darrick Greenlaw ACNP Acute Care Nurse Practitioner Maryanna Shape Pulmonary/Critical Care Please consult Amion 07/18/2019, 10:45 AM

## 2019-07-18 NOTE — Care Plan (Signed)
CXR reviewed- No recurrent pneumothorax. Ok to leave chest tube to water seal unless she decompensates from a respiratory standpoint.   Julian Hy, DO 07/18/19 8:17 AM [facility]

## 2019-07-19 ENCOUNTER — Inpatient Hospital Stay (HOSPITAL_COMMUNITY): Payer: 59

## 2019-07-19 MED ORDER — MORPHINE SULFATE (PF) 2 MG/ML IV SOLN
2.0000 mg | INTRAVENOUS | Status: AC | PRN
  Administered 2019-07-19 (×2): 2 mg via INTRAVENOUS
  Filled 2019-07-19 (×2): qty 1

## 2019-07-19 MED ORDER — SODIUM CHLORIDE 0.9 % IV SOLN
500.0000 mg | Freq: Once | INTRAVENOUS | Status: AC
  Administered 2019-07-19: 500 mg via INTRAPLEURAL
  Filled 2019-07-19: qty 500

## 2019-07-19 MED ORDER — LIDOCAINE HCL 1 % IJ SOLN: 30.0000 mL | Freq: Once | INTRAMUSCULAR | Status: DC

## 2019-07-19 MED ORDER — OXYCODONE-ACETAMINOPHEN 5-325 MG PO TABS
1.0000 | ORAL_TABLET | Freq: Four times a day (QID) | ORAL | Status: DC | PRN
  Administered 2019-07-19 – 2019-07-22 (×7): 1 via ORAL
  Filled 2019-07-19 (×7): qty 1

## 2019-07-19 MED ORDER — STERILE WATER FOR INJECTION IJ SOLN: 200.0000 mg | Freq: Once | INTRAVENOUS | Status: DC

## 2019-07-19 MED ORDER — LIDOCAINE HCL 1 % IJ SOLN
30.0000 mL | Freq: Once | INTRAMUSCULAR | Status: AC
  Administered 2019-07-19: 30 mL via INTRAPLEURAL
  Filled 2019-07-19: qty 30

## 2019-07-19 NOTE — Progress Notes (Signed)
PROGRESS NOTE  CANNON QUINTON TDV:[id number] DOB: July 20, 1955 DOA: 07/16/2019 PCP: Hoyt Koch, MD  Brief History   64 year old female with past medical history of pulmonary sarcoidosis and recurrent secondary pneumothorax who in February underwent robotic thoracoscopy pleurodesis and pleurectomy by CVTS found to have recurrent pneumothorax again on 6/22.  Presented to the emergency room and chest tube placed in the emergency room.  Transferred to [facility] to hospitalist service.  Pulmonary consulted for chest tube management.  CVTS also following patient.  Today, patient is feeling okay.  Mild pain with movement, but not with deep inspiration.  Chest x-ray noting no evidence of pneumothorax.  Seen by CVTS who recommended keeping chest tube in for 1 more day.  The patient underwent pleurodesis with doxycycline today. Chest tube remains to water seal.  Consultants   PCCM  TCTS  Procedures   Chest tube now to water seal  Pleurodesis  Antibiotics   Anti-infectives (From admission, onward)   Start     Dose/Rate Route Frequency Ordered Stop   07/19/19 0945  doxycycline(VIBRAMYCIN) 500 mg in 0.9% sodium chloride 50 mL syringe        500 mg Intrapleural  Once 07/19/19 0940 07/19/19 1120   07/19/19 0930  doxycycline 200mg  in 20 mL SWI irrigation / sclerosing solution for IR  Status:  Discontinued       Note to Pharmacy: Please prepare 500 mg doxycycline in 50 cc for sclerosing agent.In Leur lock syringe   200 mg Irrigation  Once 07/19/19 0626 07/19/19 0940       Subjective  The patient is resting comfortably. No new complaints.  Objective   Vitals:  Vitals:   07/19/19 0732 07/19/19 1557  BP: (!) 153/81 (!) 153/83  Pulse: (!) 52 80  Resp: 16 16  Temp: 97.8 F (36.6 C) 98.1 F (36.7 C)  SpO2: 100% 95%   Exam:  Constitutional:   The patient is awake, alert, and oriented x 3. No acute distress. Respiratory:   No increased work of breathing.  No  wheezes or rhonchi  Rales at bases bilaterally  No tactile fremitus  Dullness at right base.  Cardiovascular:   Regular rate and rhythm  No murmurs, ectopy, or gallups.  No lateral PMI. No thrills. Abdomen:   Abdomen is soft, non-tender, non-distended  No hernias, masses, or organomegaly  Normoactive bowel sounds.  Musculoskeletal:   No cyanosis, clubbing, or edema Skin:   No rashes, lesions, ulcers  palpation of skin: no induration or nodules Neurologic:   CN 2-12 intact  Sensation all 4 extremities intact Psychiatric:   Mental status o Mood, affect appropriate o Orientation to person, place, time   judgment and insight appear intact  I have personally reviewed the following:   Today's Data   Vitals  Imaging   CXR: No discernable PTX. Catheter remains in pleural space  Scheduled Meds:  enoxaparin (LOVENOX) injection  40 mg Subcutaneous R48N   folic acid  5 mg Oral Daily   hydrochlorothiazide  12.5 mg Oral Daily   methotrexate  7.5 mg Oral Weekly   prednisoLONE acetate  1 drop Right Eye QID   sodium chloride flush  10 mL Intracatheter Q8H   Continuous Infusions:  Principal Problem:   Pneumothorax Active Problems:   SARCOIDOSIS, PULMONARY   Chronic respiratory failure with hypoxia (HCC)   Essential hypertension   ILD (interstitial lung disease) ([place])   LOS: 3 days   A & P  Pneumothorax: Resolved as per  CXR this am. Chest tube remains in place. PCCM plans for pleurodesis with doxycycline this morning. Chest tube is as per TCTS.  Pulmonary sarcoidosis: Continue methotrexate as at home.   Chronic respiratory failure with hypoxia: Continue O2 by [place]. Monitor.  Essential hypertension: Blood pressures are a little high on HCTZ 12.5 mg alone. Will start lisinopril.  Anemia: Anemia of chronic disease. Will check iron studies to rule out iron deficiency anemia  I have seen and examined this patient myself. I have spent 34 minutes in her  evaluation and care.  DVT Prophylaxis: Lovenox CODE STATUS: Full Code Family Communication: None available Disposition: The patient is from home. Anticipate discharge to home. Barriers to discharge Need for pleurodesis and follow up.   Status is: Inpatient  Remains inpatient appropriate because:Inpatient level of care appropriate due to severity of illness   Dispo: The patient is from: Home              Anticipated d/c is to: Home              Anticipated d/c date is: 1 day              Patient currently is not medically stable to d/c.            Stephany Poorman, DO [facility] Direct contact: see [email or web address]  7PM-7AM contact night coverage as above 07/19/2019, 4:29 PM  LOS: 3 days

## 2019-07-19 NOTE — Progress Notes (Signed)
RT weaned pt's O2 from 2L to room air this AM. Pt tolerated well 100%

## 2019-07-19 NOTE — Progress Notes (Signed)
Name: Lauren Massey MRN: [id number] DOB: 1955-10-01    ADMISSION DATE:  07/16/2019 CONSULTATION DATE: 07/17/2019  REFERRING MD : [facility]  CHIEF COMPLAINT: Shortness of breath following coughing episode  BRIEF PATIENT DESCRIPTION: Thin female no acute distress following chest tube insertion.  SIGNIFICANT EVENTS  Right chest tube placement 07/16/2019  STUDIES:  Chest x-ray with right pneumothorax subsequent chest x-ray 07/16/2019 with resolution of his pneumothorax with placement of right chest tube.   HISTORY OF PRESENT ILLNESS:   64 year old female with history of sarcoid, interstitial lung disease, pneumothoraces x2 status post robotic thorascopic intervention was pleurodesis in February 2021 per cardiovascular thoracic surgeon Dr. Servando Snare.  She is followed by the pulmonary office and called the office on 07/16/2019 with shortness of breath following a coughing episode.  Due to her history of recurrent pneumothoraces in the setting of interstitial lung disease and sarcoid she was instructed to go to the emergency department for further evaluation and treatment.  In the emergency department chest x-ray revealed a right pneumothorax.  She had a right pigtail catheter placed by the emergency department physician.  She was then transferred to [facility] to the [facility] hospitalist service pulmonary critical care asked to consult.  Cardiovascular thoracic surgery has been consulted.  Pulmonary critical care has been asked to manage the chest tube while she is being admitted at this time.  Note after last discharge she did require nocturnal oxygen at 2 L.  Currently she is on oxygen in no acute distress no respiratory distress she does have a right pigtail catheter in place that has an obvious air leak with coughing and deep breathing.  Chest x-ray is reviewed shows resolution of the right pneumothorax and proper positioning of the right pigtail catheter.   SUBJECTIVE:  She is leaning  towards having pleurodesis done today. VITAL SIGNS: Temp:  [97.7 F (36.5 C)-98 F (36.7 C)] 97.8 F (36.6 C) (06/25 0732) Pulse Rate:  [52-66] 52 (06/25 0732) Resp:  [16-18] 16 (06/25 0732) BP: (130-153)/(76-86) 153/81 (06/25 0732) SpO2:  [100 %] 100 % (06/25 0732)  PHYSICAL EXAMINATION: Thin female no acute distress. Neurologic he is awake alert cognitively aware Chest with decreased breath sounds throughout Right chest tube without air leak without drainage Heart sounds are regular Abdomen soft nontender positive bowel sounds Extremities warm and dry  Recent Labs  Lab 07/16/19 1339 07/17/19 0329  NA 137 138  K 3.7 4.1  CL 101 102  CO2 27 29  BUN 18 16  CREATININE 0.78 0.74  GLUCOSE 91 94   Recent Labs  Lab 07/16/19 1339 07/17/19 0329  HGB 12.7 11.8*  HCT 41.7 38.9  WBC 4.4 3.8*  PLT 154 147*   DG Chest Port 1 View  Result Date: 07/19/2019 CLINICAL DATA:  Pneumothorax, history of sarcoidosis. EXAM: PORTABLE CHEST 1 VIEW COMPARISON:  Radiograph 07/18/2019 FINDINGS: Pigtail pleural drain remains in the periphery of the right lung base. Telemetry leads overlie the chest. No discernible right pneumothorax. Some mild residual right pleural thickening is noted. Stable appearance of the diffuse fibrotic changes throughout both with more basilar cystic changes and biapical pleuroparenchymal scarring. No new consolidative opacity is seen. Stable cardiomediastinal contours. Dense calcified hilar adenopathy. No acute osseous or soft tissue abnormality. IMPRESSION: 1. No discernible right pneumothorax. Right pleural drain remains in place. 2. Otherwise stable appearance of the chest. Electronically Signed   By: Lovena Le M.D.   On: 07/19/2019 05:02   DG CHEST PORT 1 VIEW  Result Date: 07/18/2019 CLINICAL DATA:  Follow-up chest tube EXAM: PORTABLE CHEST 1 VIEW COMPARISON:  07/17/2019 FINDINGS: Right pigtail catheter is again identified and stable. No definitive pneumothorax is  seen. Stable fibrotic changes are again identified. Some improved aeration in the left base is seen. No new focal abnormality is noted. IMPRESSION: Slight improved aeration in the left base. No pneumothorax is identified. Chest tube is noted in satisfactory position. Electronically Signed   By: Inez Catalina M.D.   On: 07/18/2019 08:06   DG CHEST PORT 1 VIEW  Result Date: 07/17/2019 CLINICAL DATA:  Follow-up pneumothorax EXAM: PORTABLE CHEST 1 VIEW COMPARISON:  07/16/2019 FINDINGS: Cardiac shadow is stable. Aortic calcifications are again seen. Chronic fibrotic changes are noted bilaterally and stable. Pigtail catheter is again noted on the right. The previously seen right pneumothorax has resolved in the interval. IMPRESSION: Resolution of right-sided pneumothorax. The remainder of the exam is stable. Electronically Signed   By: Inez Catalina M.D.   On: 07/17/2019 16:26    ASSESSMENT:   Pneumothorax recurrent   SARCOIDOSIS, PULMONARY   Chronic respiratory failure with hypoxia (HCC)   Essential hypertension   ILD (interstitial lung disease) [facility])  Discussion: 63 year old female with history of sarcoid, interstitial lung disease, pneumothoraces x2 status post robotic thorascopic intervention was pleurodesis in February 2021 per cardiovascular thoracic surgeon Dr. Servando Snare.  She is followed by the pulmonary office and called the office on 07/16/2019 with shortness of breath following a coughing episode.  Due to her history of recurrent pneumothoraces in the setting of interstitial lung disease and sarcoid she was instructed to go to the emergency department for further evaluation and treatment.  In the emergency department chest x-ray revealed a right pneumothorax.  She had a right pigtail catheter placed by the emergency department physician.  She was then transferred to [facility] to the [facility] hospitalist service pulmonary critical care asked to consult.  Cardiovascular thoracic surgery has  been consulted.  Pulmonary critical care has been asked to manage the chest tube while she is being admitted at this time.  Note after last discharge she did require nocturnal oxygen at 2 L.  Currently she is on oxygen in no acute distress no respiratory distress she does have a right pigtail catheter in place that has an obvious air leak with coughing and deep breathing.  Chest x-ray is reviewed shows resolution of the right pneumothorax and proper positioning of the right pigtail catheter. Questionable pleurodesis this admission Plan for pleurodesis 07/19/2019    PLAN:  Chest tube is now been to waterseal for 48 hours Chest x-ray reveals that lung is still fully inflated Along with discussions with patient concerning pleurodesis with doxycycline Will order doxycycline 500 cc and 50 mL of fluid along with 30 cc of 1% lidocaine to be instilled approximately 5 minutes prior to pleurodesis with doxycycline.   Richardson Landry Aysiah Jurado ACNP Acute Care Nurse Practitioner [place] Please consult Amion 07/19/2019, 9:13 AM

## 2019-07-19 NOTE — Plan of Care (Signed)

## 2019-07-20 ENCOUNTER — Inpatient Hospital Stay (HOSPITAL_COMMUNITY): Payer: 59

## 2019-07-20 DIAGNOSIS — J90 Pleural effusion, not elsewhere classified: Secondary | ICD-10-CM

## 2019-07-20 LAB — CBC WITH DIFFERENTIAL/PLATELET
Abs Immature Granulocytes: 0.01 10*3/uL (ref 0.00–0.07)
Basophils Absolute: 0 10*3/uL (ref 0.0–0.1)
Basophils Relative: 1 %
Eosinophils Absolute: 0.1 10*3/uL (ref 0.0–0.5)
Eosinophils Relative: 3 %
HCT: 41 % (ref 36.0–46.0)
Hemoglobin: 12.5 g/dL (ref 12.0–15.0)
Immature Granulocytes: 0 %
Lymphocytes Relative: 26 %
Lymphs Abs: 1.1 10*3/uL (ref 0.7–4.0)
MCH: 28.2 pg (ref 26.0–34.0)
MCHC: 30.5 g/dL (ref 30.0–36.0)
MCV: 92.3 fL (ref 80.0–100.0)
Monocytes Absolute: 0.6 10*3/uL (ref 0.1–1.0)
Monocytes Relative: 14 %
Neutro Abs: 2.4 10*3/uL (ref 1.7–7.7)
Neutrophils Relative %: 56 %
Platelets: 179 10*3/uL (ref 150–400)
RBC: 4.44 MIL/uL (ref 3.87–5.11)
RDW: 14.4 % (ref 11.5–15.5)
WBC: 4.2 10*3/uL (ref 4.0–10.5)
nRBC: 0 % (ref 0.0–0.2)

## 2019-07-20 LAB — BASIC METABOLIC PANEL
Anion gap: 11 (ref 5–15)
BUN: 23 mg/dL (ref 8–23)
CO2: 27 mmol/L (ref 22–32)
Calcium: 9 mg/dL (ref 8.9–10.3)
Chloride: 96 mmol/L — ABNORMAL LOW (ref 98–111)
Creatinine, Ser: 0.83 mg/dL (ref 0.44–1.00)
GFR calc Af Amer: >60 mL/min (ref >60)
GFR calc non Af Amer: >60 mL/min (ref >60)
Glucose, Bld: 90 mg/dL (ref 70–99)
Potassium: 4.7 mmol/L (ref 3.5–5.1)
Sodium: 134 mmol/L — ABNORMAL LOW (ref 135–145)

## 2019-07-20 LAB — PHOSPHORUS: Phosphorus: 3.8 mg/dL (ref 2.5–4.6)

## 2019-07-20 LAB — MAGNESIUM: Magnesium: 2 mg/dL (ref 1.7–2.4)

## 2019-07-20 MED ORDER — SODIUM CHLORIDE 0.9% FLUSH: 10.0000 mL | Freq: Three times a day (TID) | INTRAVENOUS | Status: DC

## 2019-07-20 NOTE — Progress Notes (Signed)
PROGRESS NOTE  Lauren Massey OJJ:[id number] DOB: Oct 12, 1955 DOA: 07/16/2019 PCP: Hoyt Koch, MD  Brief History   64 year old female with past medical history of pulmonary sarcoidosis and recurrent secondary pneumothorax who in February underwent robotic thoracoscopy pleurodesis and pleurectomy by CVTS found to have recurrent pneumothorax again on 6/22.  Presented to the emergency room and chest tube placed in the emergency room.  Transferred to [facility] to hospitalist service.  Pulmonary consulted for chest tube management.  CVTS also following patient.  Today, patient is feeling okay.  Mild pain with movement, but not with deep inspiration.  Chest x-ray noting no evidence of pneumothorax.  Seen by CVTS who recommended keeping chest tube in for 1 more day.  The patient underwent pleurodesis with doxycycline on 07/19/2019. Chest tube remains to water seal.  Consultants  . PCCM . TCTS  Procedures  . Chest tube now to water seal . Pleurodesis  Antibiotics   Anti-infectives (From admission, onward)   Start     Dose/Rate Route Frequency Ordered Stop   07/19/19 0945  doxycycline(VIBRAMYCIN) 500 mg in 0.9% sodium chloride 50 mL syringe        500 mg Intrapleural  Once 07/19/19 0940 07/19/19 1120   07/19/19 0930  doxycycline 200mg  in 20 mL SWI irrigation / sclerosing solution for IR  Status:  Discontinued       Note to Pharmacy: Please prepare 500 mg doxycycline in 50 cc for sclerosing agent.In Leur lock syringe   200 mg Irrigation  Once 07/19/19 9371 07/19/19 0940      Subjective  The patient is resting comfortably. No new complaints. She states that pain is well managed with as needed pain control.  Objective   Vitals:  Vitals:   07/20/19 0832 07/20/19 1620  BP: 115/67 124/66  Pulse: 67 (!) 56  Resp: 16 16  Temp: 98.6 F (37 C) 98.3 F (36.8 C)  SpO2: 100% 100%   Exam:  Constitutional:  . The patient is awake, alert, and oriented x 3. No acute  distress. Respiratory:  . No increased work of breathing. . No wheezes or rhonchi . Rales at bases bilaterally . No tactile fremitus . Dullness at right base.  Cardiovascular:  . Regular rate and rhythm . No murmurs, ectopy, or gallups. . No lateral PMI. No thrills. Abdomen:  . Abdomen is soft, non-tender, non-distended . No hernias, masses, or organomegaly . Normoactive bowel sounds.  Musculoskeletal:  . No cyanosis, clubbing, or edema Skin:  . No rashes, lesions, ulcers . palpation of skin: no induration or nodules Neurologic:  . CN 2-12 intact . Sensation all 4 extremities intact Psychiatric:  . Mental status o Mood, affect appropriate o Orientation to person, place, time  . judgment and insight appear intact  I have personally reviewed the following:   Today's Data  . Vitals, BMP, CBC  Imaging  . CXR: No discernable PTX. Catheter remains in pleural space  Scheduled Meds: . enoxaparin (LOVENOX) injection  40 mg Subcutaneous Q24H  . folic acid  5 mg Oral Daily  . hydrochlorothiazide  12.5 mg Oral Daily  . methotrexate  7.5 mg Oral Weekly  . prednisoLONE acetate  1 drop Right Eye QID  . sodium chloride flush  10 mL Intracatheter Q8H   Continuous Infusions:  Principal Problem:   Pneumothorax Active Problems:   SARCOIDOSIS, PULMONARY   Chronic respiratory failure with hypoxia (HCC)   Essential hypertension   ILD (interstitial lung disease) ([place])   LOS:  4 days   A & P  Pneumothorax: Resolved as per CXR this am. Chest tube remains in place. PCCM plans for pleurodesis with doxycycline this morning. Chest tube is as per TCTS. It is to remain to suction until pleural effusion decreases and flushed q8 hours per protocol. Remove tube after output is less than 150-200 cc/24 hours.   Pulmonary sarcoidosis: Continue methotrexate as at home.   Chronic respiratory failure with hypoxia: Continue O2 by [place] to maintain SaO2 greater than 88%. Monitor.  Essential  hypertension: Blood pressures are a little high on HCTZ 12.5 mg alone. Will start lisinopril.  Anemia: Anemia of chronic disease. Will check iron studies to rule out iron deficiency anemia  I have seen and examined this patient myself. I have spent 30 minutes in her evaluation and care.  DVT Prophylaxis: Lovenox CODE STATUS: Full Code Family Communication: None available Disposition: The patient is from home. Anticipate discharge to home. Barriers to discharge Need for pleurodesis and follow output from chest tube. CT may be removed when output less than 150-200cc.  Status is: Inpatient  Remains inpatient appropriate because:Inpatient level of care appropriate due to severity of illness   Dispo: The patient is from: Home              Anticipated d/c is to: Home              Anticipated d/c date is: 2-3 days.              Patient currently is not medically stable to d/c.            Jermey Closs, DO [facility] Direct contact: see [email or web address]  7PM-7AM contact night coverage as above 07/19/2019, 4:29 PM  LOS: 3 days

## 2019-07-20 NOTE — Progress Notes (Signed)
Name: Lauren Massey MRN: [id number] DOB: 1955-07-05    ADMISSION DATE:  07/16/2019 CONSULTATION DATE: 07/17/2019  REFERRING MD : [facility]  CHIEF COMPLAINT: Shortness of breath following coughing episode  BRIEF PATIENT DESCRIPTION: Thin female no acute distress following chest tube insertion.  SIGNIFICANT EVENTS  Right chest tube placement 07/16/2019  STUDIES:  Chest x-ray with right pneumothorax subsequent chest x-ray 07/16/2019 with resolution of his pneumothorax with placement of right chest tube.   HISTORY OF PRESENT ILLNESS:   64 year old female with history of sarcoid, interstitial lung disease, pneumothoraces x2 status post robotic thorascopic intervention was pleurodesis in February 2021 per cardiovascular thoracic surgeon Dr. Servando Massey.  She is followed by the pulmonary office and called the office on 07/16/2019 with shortness of breath following a coughing episode.  Due to her history of recurrent pneumothoraces in the setting of interstitial lung disease and sarcoid she was instructed to go to the emergency department for further evaluation and treatment.  In the emergency department chest x-ray revealed a right pneumothorax.  She had a right pigtail catheter placed by the emergency department physician.  She was then transferred to [facility] to the [facility] hospitalist service pulmonary critical care asked to consult.  Cardiovascular thoracic surgery has been consulted.  Pulmonary critical care has been asked to manage the chest tube while she is being admitted at this time.  Note after last discharge she did require nocturnal oxygen at 2 L.  Currently she is on oxygen in no acute distress no respiratory distress she does have a right pigtail catheter in place that has an obvious air leak with coughing and deep breathing.  Chest x-ray is reviewed shows resolution of the right pneumothorax and proper positioning of the right pigtail catheter.   SUBJECTIVE:  Having some  pain from pleurodesis which resolves with oral pain meds.   VITAL SIGNS: Temp:  [98.1 F (36.7 C)-98.6 F (37 C)] 98.6 F (37 C) (06/26 0832) Pulse Rate:  [67-80] 67 (06/26 0832) Resp:  [14-16] 16 (06/26 0832) BP: (106-153)/(61-83) 115/67 (06/26 0832) SpO2:  [95 %-100 %] 100 % (06/26 0832)  PHYSICAL EXAMINATION: Thin healthy-appearing middle-aged woman sitting up in bed no acute distress [place]/AT, eyes anicteric Breathing comfortably on nasal cannula, rales bilaterally.  No accessory muscle use or tachypnea Regular rate and rhythm Chest tube with small volume yellow effusion with minimal amounts of proteinaceous debris Abdomen nondistended, soft No peripheral edema no rashes or wounds  CXR personally reviewed 6/26-mildly increased opacification over inferior right hemithorax, no accumulated effusion, pigtail catheter in place.   Recent Labs  Lab 07/16/19 1339 07/17/19 0329 07/20/19 0420  NA 137 138 134*  K 3.7 4.1 4.7  CL 101 102 96*  CO2 27 29 27   BUN 18 16 23   CREATININE 0.78 0.74 0.83  GLUCOSE 91 94 90   Recent Labs  Lab 07/16/19 1339 07/17/19 0329 07/20/19 0420  HGB 12.7 11.8* 12.5  HCT 41.7 38.9 41.0  WBC 4.4 3.8* 4.2  PLT 154 147* 179    ASSESSMENT: Secondary pneumothorax on right, recurrent   SARCOIDOSIS, PULMONARY   Chronic respiratory failure with hypoxia (HCC)   Essential hypertension   ILD (interstitial lung disease) (HCC) Status post doxycycline pleurodesis on 6/25, now with inflammatory effusion.    PLAN: -Chest tube should remain to suction until pleural effusion decreases -Flush chest tube every 8 hours per protocol -AM CXR -Monitor chest tube output; do not remove chest tube until pleural fluid less than 150  to 200 cc per 24 hours -Need to reassess on waterseal after pleural fluid volume decreases -Continue Norco for pain management -Continue supplemental oxygen as required to maintain SPO2 greater than 88%.  Lauren Hy, DO 07/20/19  11:59 AM [facility]

## 2019-07-21 ENCOUNTER — Inpatient Hospital Stay (HOSPITAL_COMMUNITY): Payer: 59

## 2019-07-21 DIAGNOSIS — D860 Sarcoidosis of lung: Principal | ICD-10-CM

## 2019-07-21 NOTE — Progress Notes (Signed)
PROGRESS NOTE  Lauren Massey:[id number] DOB: 05-07-1955 DOA: 07/16/2019 PCP: Hoyt Koch, MD  Brief History   64 year old female with past medical history of pulmonary sarcoidosis and recurrent secondary pneumothorax who in February underwent robotic thoracoscopy pleurodesis and pleurectomy by CVTS found to have recurrent pneumothorax again on 6/22.  Presented to the emergency room and chest tube placed in the emergency room.  Transferred to [facility] to hospitalist service.  Pulmonary consulted for chest tube management.  CVTS also following patient.  Today, patient is feeling okay.  Mild pain with movement, but not with deep inspiration.  Chest x-ray noting no evidence of pneumothorax.  Seen by CVTS who recommended keeping chest tube in for 1 more day.  The patient underwent pleurodesis with doxycycline on 07/19/2019. Chest tube remains to water seal. Per Dr. Anell Barr note 07/21/2019, CT may be removed tomorrow. O2 being weaned down.   Consultants   PCCM  TCTS  Procedures   Chest tube now to water seal  Pleurodesis  Antibiotics   Anti-infectives (From admission, onward)   Start     Dose/Rate Route Frequency Ordered Stop   07/19/19 0945  doxycycline(VIBRAMYCIN) 500 mg in 0.9% sodium chloride 50 mL syringe        500 mg Intrapleural  Once 07/19/19 0940 07/19/19 1120   07/19/19 0930  doxycycline 200mg  in 20 mL SWI irrigation / sclerosing solution for IR  Status:  Discontinued       Note to Pharmacy: Please prepare 500 mg doxycycline in 50 cc for sclerosing agent.In Leur lock syringe   200 mg Irrigation  Once 07/19/19 8786 07/19/19 0940      Subjective  The patient is resting comfortably. No new complaints. She states that pain is well managed with as needed pain control.  Objective   Vitals:  Vitals:   07/21/19 0351 07/21/19 0919  BP: (!) 167/87 112/64  Pulse: 62 60  Resp: 14 16  Temp: 97.6 F (36.4 C)   SpO2: 100% 100%    Exam:  Constitutional:   The patient is awake, alert, and oriented x 3. No acute distress. Respiratory:   No increased work of breathing.  No wheezes or rhonchi  Rales at bases bilaterally  No tactile fremitus  Dullness at right base.  Cardiovascular:   Regular rate and rhythm  No murmurs, ectopy, or gallups.  No lateral PMI. No thrills. Abdomen:   Abdomen is soft, non-tender, non-distended  No hernias, masses, or organomegaly  Normoactive bowel sounds.  Musculoskeletal:   No cyanosis, clubbing, or edema Skin:   No rashes, lesions, ulcers  palpation of skin: no induration or nodules Neurologic:   CN 2-12 intact  Sensation all 4 extremities intact Psychiatric:   Mental status o Mood, affect appropriate o Orientation to person, place, time   judgment and insight appear intact  I have personally reviewed the following:   Today's Data   Vitals, BMP, CBC  Imaging   CXR: No discernable PTX. Catheter remains in pleural space  Scheduled Meds:  enoxaparin (LOVENOX) injection  40 mg Subcutaneous V67M   folic acid  5 mg Oral Daily   hydrochlorothiazide  12.5 mg Oral Daily   methotrexate  7.5 mg Oral Weekly   prednisoLONE acetate  1 drop Right Eye QID   sodium chloride flush  10 mL Intracatheter Q8H   Continuous Infusions:  Principal Problem:   Pneumothorax Active Problems:   SARCOIDOSIS, PULMONARY   Chronic respiratory failure with hypoxia (HCC)  Essential hypertension   ILD (interstitial lung disease) ([place])   LOS: 5 days   A & P  Pneumothorax: Resolved as per CXR this am. Chest tube remains in place. PCCM plans for pleurodesis with doxycycline this morning. Chest tube is as per TCTS. It is to remain to suction until pleural effusion decreases and flushed q8 hours per protocol. Remove tube after output is less than 150-200 cc/24 hours. PCCM anticipates that CT may be able to be removed tomorrow. Oxygen being weaned down.  Pulmonary  sarcoidosis: Continue methotrexate as at home.   Chronic respiratory failure with hypoxia: Continue O2 by [place] to maintain SaO2 greater than 88%. Monitor.  Essential hypertension: Blood pressures are a little high on HCTZ 12.5 mg alone. Will start lisinopril.  Anemia: Anemia of chronic disease. Will check iron studies to rule out iron deficiency anemia  I have seen and examined this patient myself. I have spent 28 minutes in her evaluation and care.  DVT Prophylaxis: Lovenox CODE STATUS: Full Code Family Communication: None available Disposition: The patient is from home. Anticipate discharge to home. Barriers to discharge Need for pleurodesis and follow output from chest tube. CT may be removed when output less than 150-200cc. Possibly tomorrow.  Status is: Inpatient  Remains inpatient appropriate because:Inpatient level of care appropriate due to severity of illness   Dispo: The patient is from: Home              Anticipated d/c is to: Home              Anticipated d/c date is: 1-2 days.              Patient currently is not medically stable to d/c.  Franke Menter, DO [facility] Direct contact: see [email or web address]  7PM-7AM contact night coverage as above 07/19/2019, 4:29 PM  LOS: 3 days

## 2019-07-21 NOTE — Progress Notes (Addendum)
PROGRESS NOTE    Lauren Massey  HKV:[id number]  DOB: 1955-11-20  PCP: Hoyt Koch, MD Admit date:07/16/2019 Chief compliant: shortness of breath following a coughing episode. 64 year old female with history of sarcoid, interstitial lung disease, pneumothoraces x2 status post robotic thorascopic intervention was pleurodesis in February 2021 ( CT surgeon Dr. Servando Snare).She is followed by the pulmonary office and called the office on 07/16/2019 with shortness of breath following a coughing episode.  Due to her history of recurrent pneumothoraces in the setting of interstitial lung disease and sarcoid she was instructed to go to the emergency department for further evaluation and treatment. ED Course: Afebrile,chest x-ray revealed a right pneumothorax.  She had a right pigtail catheter placed by the emergency department physician.  She was then transferred to [facility]. Hospital course: Patient admitted to [facility] with PCCM/CT sx consults.  Subjective:  Patient resting in her bed.  Relatively comfortable and states familiar with chest tube process from previous experiences.  Seen by pulmonary earlier today.  Objective: Vitals:   07/21/19 2345 07/22/19 0412 07/22/19 0754 07/22/19 1654  BP: 123/74  118/70 130/76  Pulse: 67  (!) 54 (!) 59  Resp:  18 18 19   Temp: 98.4 F (36.9 C)  97.8 F (36.6 C) 98.8 F (37.1 C)  TempSrc: Oral     SpO2: 94%  100% 100%    Intake/Output Summary (Last 24 hours) at 07/22/2019 1737 Last data filed at 07/22/2019 1050 Gross per 24 hour  Intake 10 ml  Output 1000 ml  Net -990 ml   There were no vitals filed for this visit.  Physical Examination: General: Thin built, no acute distress noted, right-sided chest tube to waterseal Head ENT: Atraumatic normocephalic, PERRLA, neck supple Heart: S1-S2 heard, regular rate and rhythm, no murmurs.  No leg edema noted Lungs: Decreased but almost equal air entry bilaterally, no rhonchi or rales on exam, no accessory  muscle use Abdomen: Bowel sounds heard, soft, nontender, nondistended. No organomegaly.  No CVA tenderness Extremities: No pedal edema.  No cyanosis or clubbing. Neurological: Awake alert oriented x3, no focal weakness or numbness, strength and sensations to crude touch intact Skin: No wounds or rashes.   Data Reviewed: I have personally reviewed following labs and imaging studies  CBC: Recent Labs  Lab 07/16/19 1339 07/17/19 0329 07/20/19 0420  WBC 4.4 3.8* 4.2  NEUTROABS 2.7  --  2.4  HGB 12.7 11.8* 12.5  HCT 41.7 38.9 41.0  MCV 91.2 92.2 92.3  PLT 154 147* 564   Basic Metabolic Panel: Recent Labs  Lab 07/16/19 1339 07/17/19 0329 07/20/19 0420  NA 137 138 134*  K 3.7 4.1 4.7  CL 101 102 96*  CO2 27 29 27   GLUCOSE 91 94 90  BUN 18 16 23   CREATININE 0.78 0.74 0.83  CALCIUM 9.2 9.4 9.0  MG  --   --  2.0  PHOS  --   --  3.8   GFR: CrCl cannot be calculated (Unknown ideal weight.). Liver Function Tests: No results for input(s): AST, ALT, ALKPHOS, BILITOT, PROT, ALBUMIN in the last 168 hours. No results for input(s): LIPASE, AMYLASE in the last 168 hours. No results for input(s): AMMONIA in the last 168 hours. Coagulation Profile: No results for input(s): INR, PROTIME in the last 168 hours. Cardiac Enzymes: No results for input(s): CKTOTAL, CKMB, CKMBINDEX, TROPONINI in the last 168 hours. BNP (last 3 results) No results for input(s): PROBNP in the last 8760 hours. HbA1C: No results for input(s):  HGBA1C in the last 72 hours. CBG: No results for input(s): GLUCAP in the last 168 hours. Lipid Profile: No results for input(s): CHOL, HDL, LDLCALC, TRIG, CHOLHDL, LDLDIRECT in the last 72 hours. Thyroid Function Tests: No results for input(s): TSH, T4TOTAL, FREET4, T3FREE, THYROIDAB in the last 72 hours. Anemia Panel: No results for input(s): VITAMINB12, FOLATE, FERRITIN, TIBC, IRON, RETICCTPCT in the last 72 hours. Sepsis Labs: No results for input(s): PROCALCITON,  LATICACIDVEN in the last 168 hours.  Recent Results (from the past 240 hour(s))  SARS Coronavirus 2 by RT PCR (hospital order, performed in [facility] hospital lab) Nasopharyngeal Nasopharyngeal Swab     Status: None   Collection Time: 07/16/19  2:03 PM   Specimen: Nasopharyngeal Swab  Result Value Ref Range Status   SARS Coronavirus 2 NEGATIVE NEGATIVE Final    Comment: (NOTE) SARS-CoV-2 target nucleic acids are NOT DETECTED.  The SARS-CoV-2 RNA is generally detectable in upper and lower respiratory specimens during the acute phase of infection. The lowest concentration of SARS-CoV-2 viral copies this assay can detect is 250 copies / mL. A negative result does not preclude SARS-CoV-2 infection and should not be used as the sole basis for treatment or other patient management decisions.  A negative result may occur with improper specimen collection / handling, submission of specimen other than nasopharyngeal swab, presence of viral mutation(s) within the areas targeted by this assay, and inadequate number of viral copies (<250 copies / mL). A negative result must be combined with clinical observations, patient history, and epidemiological information.  Fact Sheet for Patients:   StrictlyIdeas.no  Fact Sheet for Healthcare Providers: BankingDealers.co.za  This test is not yet approved or  cleared by the [place] FDA and has been authorized for detection and/or diagnosis of SARS-CoV-2 by FDA under an Emergency Use Authorization (EUA).  This EUA will remain in effect (meaning this test can be used) for the duration of the COVID-19 declaration under Section 564(b)(1) of the Act, 21 U.S.C. section 360bbb-3(b)(1), unless the authorization is terminated or revoked sooner.  Performed at [facility], [address], [place], [place] [address]       Radiology Studies: DG CHEST PORT 1 VIEW  Result Date:  07/22/2019 CLINICAL DATA:  History of pneumothorax.  Chest tube in place. EXAM: PORTABLE CHEST 1 VIEW COMPARISON:  Single-view of the chest 07/21/2026. FINDINGS: Right chest tube remains in place, unchanged. No pneumothorax. Pulmonary parenchymal changes of sarcoidosis are again seen. Heart size is normal. No acute bony abnormality. IMPRESSION: Negative for pneumothorax with a chest tube in place. Pulmonary parenchymal changes of sarcoidosis again seen Electronically Signed   By: Inge Rise M.D.   On: 07/22/2019 13:31   DG CHEST PORT 1 VIEW  Result Date: 07/21/2019 CLINICAL DATA:  Pneumothorax. History of sarcoidosis. Chest tube placement. EXAM: PORTABLE CHEST 1 VIEW COMPARISON:  07/20/2019; 07/19/2019; 07/18/2019; chest CT-03/06/2018 FINDINGS: Grossly unchanged cardiac silhouette and mediastinal contours. Stable position of support apparatus. No definite pneumothorax. There is grossly unchanged diffuse nodular thickening of the pulmonary interstitium with unchanged bibasilar heterogeneous opacities, left greater than right. Trace bilateral effusions are not excluded. No evidence of edema. No acute osseous abnormalities. Degenerative change the bilateral glenohumeral joints is suspected though incompletely evaluated. IMPRESSION: 1. Stable position of right-sided chest tube without evidence of pneumothorax. 2. Similar findings pulmonary sarcoidosis with architectural distortion and bibasilar atelectasis/scar, left greater than right. Electronically Signed   By: Sandi Mariscal M.D.   On: 07/21/2019 09:30  Scheduled Meds:  enoxaparin (LOVENOX) injection  40 mg Subcutaneous J44B   folic acid  5 mg Oral Daily   hydrochlorothiazide  12.5 mg Oral Daily   methotrexate  7.5 mg Oral Weekly   prednisoLONE acetate  1 drop Right Eye QID   sodium chloride flush  10 mL Intracatheter Q8H   Continuous Infusions:   Assessment/Plan:  Recurrent Pneumothorax : Remains on chest tube to suction and PCCM  managing. Chest x-ray is reviewed shows resolution of the right pneumothorax and proper positioning of the right pigtail catheter for inflammatory effusion. She is s/p doxycycline right pleurodesis on 6/25.  Continue Norco PRN pain.  Repeat chest x-ray on 6/27 appears stable with no worsening of effusion or pneumothorax.  Appreciate PCCM assistance with chest tube management-possible removal of chest tube today or in a.m.   Chronic resp failure with?  Acute component: after last discharge she did require nocturnal oxygen at 1-2 L but none during daytime..  Currently she is on continuous oxygen 2 L in no acute distress -Taper to off as tolerated.  ILD/sarcoidosis:Continue methotrexate   HTN: well controlled on HCTZ.   Anemia of chronic disease. Hgb stable at 12 now  DVT prophylaxis: Lovenox Code Status: Full code Family / Patient Communication: Discussed with patient and all questions answered to satisfaction Disposition Plan:   Status is: Inpatient  Remains inpatient appropriate because:IV treatments appropriate due to intensity of illness or inability to take PO   Dispo: The patient is from: Home              Anticipated d/c is to: Home              Anticipated d/c date is: 1 day              Patient currently is not medically stable to d/c.      Time spent: 25 minutes     >50% time spent in discussions with care team and coordination of care.    Guilford Shi, MD [facility] Pager in [place]  If 7PM-7AM, please contact night-coverage [email or web address] 07/22/2019, 5:37 PM

## 2019-07-21 NOTE — Progress Notes (Signed)
Current IV leaking, Pt request that we don't place another IV.  MD gave order to d/c site.

## 2019-07-21 NOTE — Progress Notes (Signed)
Name: Lauren Massey MRN: [id number] DOB: December 29, 1955    ADMISSION DATE:  07/16/2019 CONSULTATION DATE: 07/17/2019  REFERRING MD : [facility]  CHIEF COMPLAINT: Shortness of breath following coughing episode  BRIEF PATIENT DESCRIPTION: Thin female no acute distress following chest tube insertion.  SIGNIFICANT EVENTS  Right chest tube placement 07/16/2019  STUDIES:  Chest x-ray with right pneumothorax subsequent chest x-ray 07/16/2019 with resolution of his pneumothorax with placement of right chest tube.   HISTORY OF PRESENT ILLNESS:   64 year old female with history of sarcoid, interstitial lung disease, pneumothoraces x2 status post robotic thorascopic intervention was pleurodesis in February 2021 per cardiovascular thoracic surgeon Dr. Servando Snare.  She is followed by the pulmonary office and called the office on 07/16/2019 with shortness of breath following a coughing episode.  Due to her history of recurrent pneumothoraces in the setting of interstitial lung disease and sarcoid she was instructed to go to the emergency department for further evaluation and treatment.  In the emergency department chest x-ray revealed a right pneumothorax.  She had a right pigtail catheter placed by the emergency department physician.  She was then transferred to [facility] to the [facility] hospitalist service pulmonary critical care asked to consult.  Cardiovascular thoracic surgery has been consulted.  Pulmonary critical care has been asked to manage the chest tube while she is being admitted at this time.  Note after last discharge she did require nocturnal oxygen at 2 L.  Currently she is on oxygen in no acute distress no respiratory distress she does have a right pigtail catheter in place that has an obvious air leak with coughing and deep breathing.  Chest x-ray is reviewed shows resolution of the right pneumothorax and proper positioning of the right pigtail catheter.   SUBJECTIVE:  Denies  complaints. Reduced chest tube output today. No SOB.   VITAL SIGNS: Temp:  [97.6 F (36.4 C)-99 F (37.2 C)] 97.6 F (36.4 C) (06/27 0351) Pulse Rate:  [56-62] 60 (06/27 0919) Resp:  [14-17] 16 (06/27 0919) BP: (112-167)/(64-87) 112/64 (06/27 0919) SpO2:  [99 %-100 %] 100 % (06/27 0919)  PHYSICAL EXAMINATION: Healthy-appearing woman sitting up in bed no acute distress [place]/AT, eyes anicteric, oral mucosa moist Breathing comfortably on nasal cannula, no tachypnea.  Speaking in full sentences.  Yellow, clear chest tube output, significantly reduced compared to yesterday. Regular rate and rhythm, no murmurs Abdomen nondistended No peripheral edema No rashes or wounds.    CXR personally reviewed 6/27-chest tube stable, no accumulated effusion, no pnx  Recent Labs  Lab 07/16/19 1339 07/17/19 0329 07/20/19 0420  NA 137 138 134*  K 3.7 4.1 4.7  CL 101 102 96*  CO2 27 29 27   BUN 18 16 23   CREATININE 0.78 0.74 0.83  GLUCOSE 91 94 90   Recent Labs  Lab 07/16/19 1339 07/17/19 0329 07/20/19 0420  HGB 12.7 11.8* 12.5  HCT 41.7 38.9 41.0  WBC 4.4 3.8* 4.2  PLT 154 147* 179    ASSESSMENT: Secondary pneumothorax on right, recurrent   SARCOIDOSIS, PULMONARY   Chronic respiratory failure with hypoxia (HCC)   Essential hypertension   ILD (interstitial lung disease) (HCC) Status post doxycycline pleurodesis on 6/25, now with inflammatory effusion-- improving.    PLAN: -Continue chest tube to suction.  If output remains low overnight tonight, would recommend removing chest tube tomorrow.  Discussed with Mrs. Marlana Salvage and her daughter over the phone. -Continue to flush chest tube every 8 hours per protocol -Follow-up chest x-ray tomorrow  morning to ensure no accumulation of pleural fluid. -Continue Norco for pain management -Titrate off supplemental oxygen   Julian Hy, DO 07/21/19 1:28 PM [facility] [place] [facility]

## 2019-07-22 ENCOUNTER — Inpatient Hospital Stay (HOSPITAL_COMMUNITY): Payer: 59

## 2019-07-22 DIAGNOSIS — J930 Spontaneous tension pneumothorax: Secondary | ICD-10-CM

## 2019-07-22 NOTE — Progress Notes (Signed)
Name: Lauren Massey MRN: [id number] DOB: 03-05-1955    ADMISSION DATE:  07/16/2019 CONSULTATION DATE: 07/17/2019  REFERRING MD : [facility]  CHIEF COMPLAINT: Shortness of breath following coughing episode  BRIEF PATIENT DESCRIPTION: Thin female no acute distress following chest tube insertion.  SIGNIFICANT EVENTS  Right chest tube placement 07/16/2019  STUDIES:  Chest x-ray with right pneumothorax subsequent chest x-ray 07/16/2019 with resolution of her pneumothorax with placement of right chest tube.   HISTORY OF PRESENT ILLNESS:   64 year old female with history of sarcoid, interstitial lung disease, pneumothoraces x2 status post robotic thorascopic intervention was pleurodesis in February 2021 per cardiovascular thoracic surgeon Dr. Servando Snare.  She is followed by the pulmonary office and called the office on 07/16/2019 with shortness of breath following a coughing episode.  Due to her history of recurrent pneumothoraces in the setting of interstitial lung disease and sarcoid she was instructed to go to the emergency department for further evaluation and treatment.  In the emergency department chest x-ray revealed a right pneumothorax.  She had a right pigtail catheter placed by the emergency department physician.  She was then transferred to [facility] to the [facility] hospitalist service pulmonary critical care asked to consult.  Cardiovascular thoracic surgery has been consulted.  Pulmonary critical care has been asked to manage the chest tube while she is being admitted at this time.  Note after last discharge she did require nocturnal oxygen at 2 L.  Currently she is on oxygen in no acute distress no respiratory distress she does have a right pigtail catheter in place that has an obvious air leak with coughing and deep breathing.  Chest x-ray is reviewed shows resolution of the right pneumothorax and proper positioning of the right pigtail catheter.   SUBJECTIVE:  Denies any  significant complaints Some discomfort around chest tube site   VITAL SIGNS: Temp:  [97.8 F (36.6 C)-98.4 F (36.9 C)] 97.8 F (36.6 C) (06/28 0754) Pulse Rate:  [54-67] 54 (06/28 0754) Resp:  [18] 18 (06/28 0754) BP: (118-123)/(70-74) 118/70 (06/28 0754) SpO2:  [94 %-100 %] 100 % (06/28 0754)  PHYSICAL EXAMINATION: Healthy-appearing woman sitting up in bed no acute distress Moist oral mucosa, anicteric Decreased air entry bilaterally S1-S2 appreciated Abdomen nondistended No peripheral edema No rashes or wounds.    CXR personally reviewed 6/27-chest tube stable, no accumulated effusion, no pnx  Recent Labs  Lab 07/16/19 1339 07/17/19 0329 07/20/19 0420  NA 137 138 134*  K 3.7 4.1 4.7  CL 101 102 96*  CO2 27 29 27   BUN 18 16 23   CREATININE 0.78 0.74 0.83  GLUCOSE 91 94 90   Recent Labs  Lab 07/16/19 1339 07/17/19 0329 07/20/19 0420  HGB 12.7 11.8* 12.5  HCT 41.7 38.9 41.0  WBC 4.4 3.8* 4.2  PLT 154 147* 179    ASSESSMENT: Secondary pneumothorax on right, recurrent -Has had to have chest tube placed 2 times in the past   SARCOIDOSIS, PULMONARY   Chronic respiratory failure with hypoxia (HCC)   Essential hypertension   ILD (interstitial lung disease) ([place]) Status post doxycycline pleurodesis on 6/25, now with inflammatory effusion-- improving.    PLAN: -Continue chest tube to suction.  If output remains low overnight tonight, would recommend removing chest tube tomorrow.  Discussed with Mrs. Bowler  -Take care still suction -Repeat chest x-ray in a couple of hours -If no pneumothorax -Will discontinue chest tube today  -Await chest tube after 1300 hrs. -Continue Norco for pain  management -Titrate off supplemental oxygen   Sherrilyn Rist, MD [place] [facility] Pager: [phone]

## 2019-07-23 ENCOUNTER — Inpatient Hospital Stay (HOSPITAL_COMMUNITY): Payer: 59

## 2019-07-23 ENCOUNTER — Telehealth: Payer: Self-pay | Admitting: Internal Medicine

## 2019-07-23 NOTE — TOC Transition Note (Signed)
Transition of Care [facility]) - CM/SW Discharge Note   Patient Details  Name: Lauren Massey MRN: [id number] Date of Birth: 11/30/1955  [facility]) CM/SW Contact:  Pollie Friar, RN Phone Number: 07/23/2019, 12:51 PM   Clinical Narrative:    Pt discharging home with self care. Pt has oxygen at home through [facility] for night time use.  Spouse is able to provide intermittent supervision at home and transport to home.  No changes made to her medications.   Final next level of care: Home/Self Care Barriers to Discharge: No Barriers Identified   Patient Goals and CMS Choice        Discharge Placement                       Discharge Plan and Services                                     Social Determinants of Health (SDOH) Interventions     Readmission Risk Interventions No flowsheet data found.

## 2019-07-23 NOTE — Discharge Summary (Signed)
Physician Discharge Summary  Lauren Massey QIW:[id number] DOB: 06-Jun-1955 DOA: 07/16/2019  PCP: Hoyt Koch, MD  Admit date: 07/16/2019 Discharge date: 07/23/2019 Consultations: Pulmonary  Admitted From: home Disposition: home  Discharge Diagnoses:  Principal Problem:   Pneumothorax Active Problems:   SARCOIDOSIS, PULMONARY   Chronic respiratory failure with hypoxia ([place])   Essential hypertension   ILD (interstitial lung disease) [facility])   Hospital Course Summary: Chief compliant: shortness of breath following a coughing episode. 64 year old female with history of sarcoid, interstitial lung disease, pneumothoraces x2 status post robotic thorascopic intervention was pleurodesis in February 2021 ( CT surgeon Dr. Servando Snare).She is followed by the pulmonary office and called the office on 07/16/2019 with shortness of breath following a coughing episode. Due to her history of recurrent pneumothoraces in the setting of interstitial lung disease and sarcoid she was instructed to go to the emergency department for further evaluation and treatment. ED Course: Afebrile,chest x-ray revealed a right pneumothorax. She had a right pigtail catheter placed by the emergency department physician. She was then transferred to [facility]. Hospital course: Patient admitted to [facility] with PCCM/CT sx consults.  Recurrent Pneumothorax :  Remained on chest tube to suction until last night and PCCM been managing. Chest x-ray is reviewed shows resolution of the right pneumothorax and proper positioning of the right pigtail catheter for inflammatory effusion. She was evaluated by CT surgery, underwent doxycycline right pleurodesis on 6/25.  Repeat chest x-ray on 6/27 appears stable with no worsening of effusion or pneumothorax.  Appreciate PCCM assistance with chest tube management-s/p removal of chest tube since last night and doing well.   Chronic hypoxic resp failure: after last discharge she did require nocturnal  oxygen at 1-2 L but none during daytime. She was placed on continuous oxygen 2 L while undergoing procedures for above, although no true hypoxia documented. She is currently off 02 and saturating well,  in no acute distress -may resume nocturnal o2 upon discharge  ILD/sarcoidosis:Continue methotrexate   HTN: normotensive on home med- HCTZ.      Discharge Exam:  Vitals:   07/22/19 1654 07/22/19 2151 07/23/19 0715 07/23/19 0856  BP: 130/76 121/72  115/84  Pulse: (!) 59 (!) 58  (!) 58  Resp: 19  12 19   Temp: 98.8 F (37.1 C) 98.3 F (36.8 C)  98.5 F (36.9 C)  TempSrc:      SpO2: 100%   100%    General: Pt is alert, awake, not in acute distress Cardiovascular: RRR, S1/S2 +, no rubs, no gallops Respiratory: CTA bilaterally, no wheezing, no rhonchi Abdominal: Soft, NT, ND, bowel sounds + Extremities: no edema, no cyanosis  Discharge Condition:Stable CODE STATUS: Full code Diet recommendation: low salt Recommendations for Outpatient Follow-up:  1. Follow up with PCP: 1 week 2. Follow up with consultants: Pulmonary clinic as scheduled 3. Please obtain follow up labs including:   Home Health services upon discharge: none Equipment/Devices upon discharge:resume nocturnal home 02 1-2 lits   Discharge Instructions:  Discharge Instructions    ([place]) Call MD:  Anytime you have any of the following symptoms: 1) 3 pound weight gain in 24 hours or 5 pounds in 1 week 2) shortness of breath, with or without a dry hacking cough 3) swelling in the hands, feet or stomach 4) if you have to sleep on extra pillows at night in order to breathe.   Complete by: As directed    Call MD for:  difficulty breathing, headache or visual disturbances  Complete by: As directed    Call MD for:  extreme fatigue   Complete by: As directed    Call MD for:  persistant dizziness or light-headedness   Complete by: As directed    Call MD for:  persistant nausea and vomiting   Complete  by: As directed    Call MD for:  severe uncontrolled pain   Complete by: As directed    Call MD for:  temperature >100.4   Complete by: As directed    Diet - low sodium heart healthy   Complete by: As directed    Increase activity slowly   Complete by: As directed      Allergies as of 07/23/2019   No Known Allergies     Medication List    TAKE these medications   albuterol 108 (90 Base) MCG/ACT inhaler Commonly known as: VENTOLIN HFA Inhale 2 puffs into the lungs every 6 (six) hours as needed for wheezing or shortness of breath.   folic acid 1 MG tablet Commonly known as: FOLVITE Take 5 mg by mouth at bedtime.   hydrochlorothiazide 12.5 MG capsule Commonly known as: MICROZIDE Take 1 capsule (12.5 mg total) by mouth daily.   methotrexate 2.5 MG tablet Commonly known as: RHEUMATREX Take 7.5 mg by mouth once a week. Caution:Chemotherapy. Protect from light.  Pt takes 3 tablets every Wednesday.   prednisoLONE acetate 1 % ophthalmic suspension Commonly known as: PRED FORTE Place 1 drop into the right eye 2 (two) times daily. Reported on 04/16/2015 What changed: when to take this       No Known Allergies    The results of significant diagnostics from this hospitalization (including imaging, microbiology, ancillary and laboratory) are listed below for reference.    Labs: BNP (last 3 results) Recent Labs    03/08/19 1341  BNP 737.1*   Basic Metabolic Panel: Recent Labs  Lab 07/16/19 1339 07/17/19 0329 07/20/19 0420  NA 137 138 134*  K 3.7 4.1 4.7  CL 101 102 96*  CO2 27 29 27   GLUCOSE 91 94 90  BUN 18 16 23   CREATININE 0.78 0.74 0.83  CALCIUM 9.2 9.4 9.0  MG  --   --  2.0  PHOS  --   --  3.8   Liver Function Tests: No results for input(s): AST, ALT, ALKPHOS, BILITOT, PROT, ALBUMIN in the last 168 hours. No results for input(s): LIPASE, AMYLASE in the last 168 hours. No results for input(s): AMMONIA in the last 168 hours. CBC: Recent Labs  Lab  07/16/19 1339 07/17/19 0329 07/20/19 0420  WBC 4.4 3.8* 4.2  NEUTROABS 2.7  --  2.4  HGB 12.7 11.8* 12.5  HCT 41.7 38.9 41.0  MCV 91.2 92.2 92.3  PLT 154 147* 179   Cardiac Enzymes: No results for input(s): CKTOTAL, CKMB, CKMBINDEX, TROPONINI in the last 168 hours. BNP: Invalid input(s): POCBNP CBG: No results for input(s): GLUCAP in the last 168 hours. D-Dimer No results for input(s): DDIMER in the last 72 hours. Hgb A1c No results for input(s): HGBA1C in the last 72 hours. Lipid Profile No results for input(s): CHOL, HDL, LDLCALC, TRIG, CHOLHDL, LDLDIRECT in the last 72 hours. Thyroid function studies No results for input(s): TSH, T4TOTAL, T3FREE, THYROIDAB in the last 72 hours.  Invalid input(s): FREET3 Anemia work up No results for input(s): VITAMINB12, FOLATE, FERRITIN, TIBC, IRON, RETICCTPCT in the last 72 hours. Urinalysis    Component Value Date/Time   COLORURINE AMBER (A) 04/24/2015 1116  APPEARANCEUR CLOUDY (A) 04/24/2015 1116   LABSPEC 1.016 04/24/2015 1116   PHURINE 6.5 04/24/2015 1116   GLUCOSEU NEGATIVE 04/24/2015 1116   HGBUR TRACE (A) 04/24/2015 1116   BILIRUBINUR negative 11/02/2017 1047   KETONESUR NEGATIVE 04/24/2015 1116   PROTEINUR Negative 11/02/2017 1047   PROTEINUR 30 (A) 04/24/2015 1116   UROBILINOGEN 0.2 11/02/2017 1047   NITRITE negative 11/02/2017 1047   NITRITE NEGATIVE 04/24/2015 1116   LEUKOCYTESUR Large (3+) (A) 11/02/2017 1047   Sepsis Labs Invalid input(s): PROCALCITONIN,  WBC,  LACTICIDVEN Microbiology Recent Results (from the past 240 hour(s))  SARS Coronavirus 2 by RT PCR (hospital order, performed in [place] hospital lab) Nasopharyngeal Nasopharyngeal Swab     Status: None   Collection Time: 07/16/19  2:03 PM   Specimen: Nasopharyngeal Swab  Result Value Ref Range Status   SARS Coronavirus 2 NEGATIVE NEGATIVE Final    Comment: (NOTE) SARS-CoV-2 target nucleic acids are NOT DETECTED.  The SARS-CoV-2 RNA is generally  detectable in upper and lower respiratory specimens during the acute phase of infection. The lowest concentration of SARS-CoV-2 viral copies this assay can detect is 250 copies / mL. A negative result does not preclude SARS-CoV-2 infection and should not be used as the sole basis for treatment or other patient management decisions.  A negative result may occur with improper specimen collection / handling, submission of specimen other than nasopharyngeal swab, presence of viral mutation(s) within the areas targeted by this assay, and inadequate number of viral copies (<250 copies / mL). A negative result must be combined with clinical observations, patient history, and epidemiological information.  Fact Sheet for Patients:   StrictlyIdeas.no  Fact Sheet for Healthcare Providers: BankingDealers.co.za  This test is not yet approved or  cleared by the [place] FDA and has been authorized for detection and/or diagnosis of SARS-CoV-2 by FDA under an Emergency Use Authorization (EUA).  This EUA will remain in effect (meaning this test can be used) for the duration of the COVID-19 declaration under Section 564(b)(1) of the Act, 21 U.S.C. section 360bbb-3(b)(1), unless the authorization is terminated or revoked sooner.  Performed at [facility], [address] [place], [place] [address]     Procedures/Studies: DG Chest 1 View  Result Date: 07/16/2019 CLINICAL DATA:  Post chest tube placement EXAM: CHEST  1 VIEW COMPARISON:  07/16/2019, 05/02/2019, CT 03/07/2019 FINDINGS: Interval insertion of right-sided chest tube with pigtail projecting over the right lower lateral chest. Decreased size of right pneumothorax with residual right lateral and basilar pneumothorax, pleural-parenchymal separation of 1.4 cm laterally. Chronic lung disease with bronchiectasis and fibrosis. Possible acute atelectasis or mild infiltrates at the lung  bases. Stable cardiomediastinal silhouette. IMPRESSION: Interval insertion of right-sided chest tube with decreased size of right pneumothorax. Residual right lateral and basilar pneumothorax as above. Chronic lung disease. Possible superimposed atelectasis or mild infiltrates at the bases. Electronically Signed   By: Donavan Foil M.D.   On: 07/16/2019 17:25   DG CHEST PORT 1 VIEW  Result Date: 07/23/2019 CLINICAL DATA:  Pneumothorax.  Chest tube removal. EXAM: PORTABLE CHEST 1 VIEW COMPARISON:  July 22, 2019. FINDINGS: The heart size and mediastinal contours are within normal limits. Right-sided chest tube has been removed. No definite pneumothorax is noted. Stable diffuse parenchymal changes are noted consistent with history of sarcoidosis. Mild superimposed subsegmental atelectasis may be present. Small pleural effusions may be present. The visualized skeletal structures are unremarkable. IMPRESSION: Right-sided chest tube has been removed. No  definite pneumothorax is noted. Stable diffuse parenchymal changes are noted consistent with history of sarcoidosis. Electronically Signed   By: Marijo Conception M.D.   On: 07/23/2019 08:27   DG CHEST PORT 1 VIEW  Result Date: 07/22/2019 CLINICAL DATA:  History of pneumothorax.  Chest tube in place. EXAM: PORTABLE CHEST 1 VIEW COMPARISON:  Single-view of the chest 07/21/2026. FINDINGS: Right chest tube remains in place, unchanged. No pneumothorax. Pulmonary parenchymal changes of sarcoidosis are again seen. Heart size is normal. No acute bony abnormality. IMPRESSION: Negative for pneumothorax with a chest tube in place. Pulmonary parenchymal changes of sarcoidosis again seen Electronically Signed   By: Inge Rise M.D.   On: 07/22/2019 13:31   DG CHEST PORT 1 VIEW  Result Date: 07/21/2019 CLINICAL DATA:  Pneumothorax. History of sarcoidosis. Chest tube placement. EXAM: PORTABLE CHEST 1 VIEW COMPARISON:  07/20/2019; 07/19/2019; 07/18/2019; chest  CT-03/06/2018 FINDINGS: Grossly unchanged cardiac silhouette and mediastinal contours. Stable position of support apparatus. No definite pneumothorax. There is grossly unchanged diffuse nodular thickening of the pulmonary interstitium with unchanged bibasilar heterogeneous opacities, left greater than right. Trace bilateral effusions are not excluded. No evidence of edema. No acute osseous abnormalities. Degenerative change the bilateral glenohumeral joints is suspected though incompletely evaluated. IMPRESSION: 1. Stable position of right-sided chest tube without evidence of pneumothorax. 2. Similar findings pulmonary sarcoidosis with architectural distortion and bibasilar atelectasis/scar, left greater than right. Electronically Signed   By: Sandi Mariscal M.D.   On: 07/21/2019 09:30   DG Chest Port 1 View  Result Date: 07/20/2019 CLINICAL DATA:  Chest tube in place. EXAM: PORTABLE CHEST 1 VIEW COMPARISON:  07/19/2019 FINDINGS: Stable position of the pigtail drain in the right lower chest. Negative for pneumothorax. Patchy parenchymal densities are again noted most prominent at the left lung base and minimally changed. Heart and mediastinum are within normal limits and stable. Atherosclerotic calcifications at the aortic arch. Trachea is midline. No acute bone abnormality. IMPRESSION: 1. Stable position of the right chest tube. Negative for pneumothorax. 2. No significant change in the patchy lung densities particularly in the left lung. Electronically Signed   By: Markus Daft M.D.   On: 07/20/2019 08:58   DG Chest Port 1 View  Result Date: 07/19/2019 CLINICAL DATA:  Pneumothorax, history of sarcoidosis. EXAM: PORTABLE CHEST 1 VIEW COMPARISON:  Radiograph 07/18/2019 FINDINGS: Pigtail pleural drain remains in the periphery of the right lung base. Telemetry leads overlie the chest. No discernible right pneumothorax. Some mild residual right pleural thickening is noted. Stable appearance of the diffuse fibrotic  changes throughout both with more basilar cystic changes and biapical pleuroparenchymal scarring. No new consolidative opacity is seen. Stable cardiomediastinal contours. Dense calcified hilar adenopathy. No acute osseous or soft tissue abnormality. IMPRESSION: 1. No discernible right pneumothorax. Right pleural drain remains in place. 2. Otherwise stable appearance of the chest. Electronically Signed   By: Lovena Le M.D.   On: 07/19/2019 05:02   DG CHEST PORT 1 VIEW  Result Date: 07/18/2019 CLINICAL DATA:  Follow-up chest tube EXAM: PORTABLE CHEST 1 VIEW COMPARISON:  07/17/2019 FINDINGS: Right pigtail catheter is again identified and stable. No definitive pneumothorax is seen. Stable fibrotic changes are again identified. Some improved aeration in the left base is seen. No new focal abnormality is noted. IMPRESSION: Slight improved aeration in the left base. No pneumothorax is identified. Chest tube is noted in satisfactory position. Electronically Signed   By: Inez Catalina M.D.   On: 07/18/2019 08:06  DG CHEST PORT 1 VIEW  Result Date: 07/17/2019 CLINICAL DATA:  Follow-up pneumothorax EXAM: PORTABLE CHEST 1 VIEW COMPARISON:  07/16/2019 FINDINGS: Cardiac shadow is stable. Aortic calcifications are again seen. Chronic fibrotic changes are noted bilaterally and stable. Pigtail catheter is again noted on the right. The previously seen right pneumothorax has resolved in the interval. IMPRESSION: Resolution of right-sided pneumothorax. The remainder of the exam is stable. Electronically Signed   By: Inez Catalina M.D.   On: 07/17/2019 16:26   DG Chest Portable 1 View  Result Date: 07/16/2019 CLINICAL DATA:  Decreased breath sounds on the right following coughing episodes EXAM: PORTABLE CHEST 1 VIEW COMPARISON:  05/02/2019 FINDINGS: Cardiac shadow is stable. Aortic calcifications are again noted. Scattered fibrotic changes are noted in both lungs consistent with the patient's given clinical history of  underlying sarcoidosis. There is a new right-sided pneumothorax centered predominately laterally and inferiorly. No bony abnormality is noted. IMPRESSION: Right-sided pneumothorax as described. Critical Value/emergent results were called by telephone at the time of interpretation on 07/16/2019 at 2:30 pm to Dr. Madalyn Rob , who verbally acknowledged these results. Electronically Signed   By: Inez Catalina M.D.   On: 07/16/2019 14:31     Time coordinating discharge: Over 30 minutes  SIGNED:   Guilford Shi, MD  [facility] 07/23/2019, 12:17 PM

## 2019-07-23 NOTE — Telephone Encounter (Signed)
Spoke with pt, states she was released from hospital today after being admitted for a R pneumothorax.  Pt has already been scheduled for a HFU with SG on 08/07/19 at 3:30.  Pt states she is going to need a letter to return to work on that day if she is improved enough to do so.  I advised that SG would evaluate her that day and we would be happy to write a work note if needed.  I advised if she needs any FMLA related documents to go through [place].  Pt expressed understanding.  Nothing further needed at this time- will close encounter.

## 2019-07-23 NOTE — Progress Notes (Signed)
Patient awake but resting. No needs at this time and no signs of acute distress noted.

## 2019-07-23 NOTE — Plan of Care (Signed)
Patient is progressing according to plan of care and adequate for discharge.

## 2019-07-24 ENCOUNTER — Telehealth: Payer: Self-pay | Admitting: *Deleted

## 2019-07-24 NOTE — Telephone Encounter (Signed)
Pt was on TCM report admitted 07/16/19 [facility]with PCCM/CT sx consults Pt was having SOB after coughing episodes. Pt had chest x-ray which revealed a right pneumothorax. She had a right pigtail catheter placed by the emergency department physician. She was then transferred [facility]. Pt underwentdoxycyclinerightpleurodesis on 6/25.Repeat chest x-ray on 6/27 appears stable with no worsening of effusion or pneumothorax. Pt D/C 07/23/19 she has hosp f/u w/pulmonology 08/07/19.Marland KitchenJohny Chess

## 2019-08-06 ENCOUNTER — Telehealth: Payer: Self-pay | Admitting: Internal Medicine

## 2019-08-06 NOTE — Telephone Encounter (Signed)
Message from disability office.

## 2019-08-07 ENCOUNTER — Encounter: Payer: Self-pay | Admitting: Acute Care

## 2019-08-07 ENCOUNTER — Ambulatory Visit: Payer: 59 | Admitting: Acute Care

## 2019-08-07 ENCOUNTER — Other Ambulatory Visit: Payer: Self-pay

## 2019-08-07 ENCOUNTER — Ambulatory Visit (INDEPENDENT_AMBULATORY_CARE_PROVIDER_SITE_OTHER)
Admission: RE | Admit: 2019-08-07 | Discharge: 2019-08-07 | Disposition: A | Discharge status: Home / Self Care | Payer: 59 | Source: Ambulatory Visit | Attending: Acute Care | Admitting: Acute Care

## 2019-08-07 VITALS — BP 122/82 | HR 68 | Temp 97.3°F | Ht 62.0 in | Wt 132.4 lb

## 2019-08-07 DIAGNOSIS — J849 Interstitial pulmonary disease, unspecified: Secondary | ICD-10-CM | POA: Diagnosis not present

## 2019-08-07 DIAGNOSIS — D869 Sarcoidosis, unspecified: Secondary | ICD-10-CM

## 2019-08-07 DIAGNOSIS — J9383 Other pneumothorax: Secondary | ICD-10-CM

## 2019-08-07 DIAGNOSIS — J9611 Chronic respiratory failure with hypoxia: Secondary | ICD-10-CM | POA: Diagnosis not present

## 2019-08-07 NOTE — Progress Notes (Signed)
History of Present Illness Lauren Massey is a 64 y.o. female  never smoker followed for chronic fibrotic sarcoidosis with multisystem involvement including lungs, skin, eyes-diagnosed via bronchoscopy 2000.  On weekly methotrexate.  64 year old female never smoker with history of sarcoid, interstitial lung disease, pneumothoraces x2 status post robotic thorascopic intervention was pleurodesis in February 2021 per cardiovascular thoracic surgeon Dr. Servando Snare.  Hospital Follow up  ADMISSION DATE:  07/16/2019 CONSULTATION DATE: 07/17/2019   SIGNIFICANT EVENTS  Right chest tube placement 07/16/2019 Status post doxycycline pleurodesis on 6/25, now with inflammatory effusion-- improving.  STUDIES:  Chest x-ray with right pneumothorax subsequent chest x-ray 07/16/2019 with resolution of her pneumothorax with placement of right chest tube and doxycycline pleurodesis on 6/25,   08/08/2019  Hospital Follow up for right  Pneumothorax 07/16/2019 with resolution after CT placement. Pt. Presents for follow up. She had right sided pneumothorax 07/16/2019.( Despite robotic thorascopic intervention was pleurodesis ) It resolved with chest tube insertion and additional doxycycline pleurodesis. She has been out of the hospital since 6/23, and she still does not feel strong enough to return to work .She states she remains short of breath. She has a cough in the morning. She has not taken any over the counter medications. She states her secretions are clear. She is wearing her oxygen at bedtime. 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 We will extend her out of work until 7/30. She will have a return visit to re-evaluate for readiness to return to work.   Oxygen saturations were 95% on RA. We did walk her in the office today, and she was able to maintain sats walking quickly greater than 93%. She does not need oxygen at present.  She denies any fever, she  endorses chest pain at previous chest tube insertion sites. , No orthopnea or hemoptysis.  Chest tube insertion sites have healed.     Test Results: Failed Plaquenil for skin rash. ACE 02/19/15- elevated 97, down from >100 07/2014.  ACE 05/12/16- 50 (9-67) Office Spirometry 08/06/2015-moderate restriction. FVC 1.53/63%, FEV1 1.19/62%, FEV1/FVC 0.78 Room air saturation at rest on arrival 03/14/16-88%, desaturating to 85% with ambulation and improving to 98% ambulating on 2 L. PFT 03/16/17-moderately severe restriction, severe reduction of diffusion. FVC 1.62/66%, FEV1 1.46/76%, ratio 0.90, TLC 50%, DLCO 26%   CBC Latest Ref Rng & Units 07/20/2019 07/17/2019 07/16/2019  WBC 4.0 - 10.5 K/uL 4.2 3.8(L) 4.4  Hemoglobin 12.0 - 15.0 g/dL 12.5 11.8(L) 12.7  Hematocrit 36 - 46 % 41.0 38.9 41.7  Platelets 150 - 400 K/uL 179 147(L) 154    BMP Latest Ref Rng & Units 07/20/2019 07/17/2019 07/16/2019  Glucose 70 - 99 mg/dL 90 94 91  BUN 8 - 23 mg/dL 23 16 18   Creatinine 0.44 - 1.00 mg/dL 0.83 0.74 0.78  Sodium 135 - 145 mmol/L 134(L) 138 137  Potassium 3.5 - 5.1 mmol/L 4.7 4.1 3.7  Chloride 98 - 111 mmol/L 96(L) 102 101  CO2 22 - 32 mmol/L 27 29 27   Calcium 8.9 - 10.3 mg/dL 9.0 9.4 9.2    BNP    Component Value Date/Time   BNP 121.4 (H) 03/08/2019 1341    ProBNP    Component Value Date/Time   PROBNP 26.0 05/12/2016 1546    PFT    Component Value Date/Time   FEV1PRE 1.42 03/16/2017 0900   FEV1POST 1.46 03/16/2017 0900   FVCPRE 1.63 03/16/2017 0900   FVCPOST 1.62 03/16/2017 0900  TLC 2.43 03/16/2017 0900   DLCOUNC 5.87 03/16/2017 0900   PREFEV1FVCRT 87 03/16/2017 0900   PSTFEV1FVCRT 90 03/16/2017 0900    DG Chest 1 View  Result Date: 07/16/2019 CLINICAL DATA:  Post chest tube placement EXAM: CHEST  1 VIEW COMPARISON:  07/16/2019, 05/02/2019, CT 03/07/2019 FINDINGS: Interval insertion of right-sided chest tube with pigtail projecting over the right lower lateral chest. Decreased  size of right pneumothorax with residual right lateral and basilar pneumothorax, pleural-parenchymal separation of 1.4 cm laterally. Chronic lung disease with bronchiectasis and fibrosis. Possible acute atelectasis or mild infiltrates at the lung bases. Stable cardiomediastinal silhouette. IMPRESSION: Interval insertion of right-sided chest tube with decreased size of right pneumothorax. Residual right lateral and basilar pneumothorax as above. Chronic lung disease. Possible superimposed atelectasis or mild infiltrates at the bases. Electronically Signed   By: Donavan Foil M.D.   On: 07/16/2019 17:25   DG Chest 2 View  Result Date: 08/07/2019 CLINICAL DATA:  64 year old female with history of shortness of breath. History of right-sided pneumothorax on 07/16/2019. EXAM: CHEST - 2 VIEW COMPARISON:  Chest x-ray 07/23/2019. FINDINGS: Lung volumes are low and there are diffuse reticulonodular opacities throughout the lungs bilaterally, similar to prior examinations, compatible with underlying pulmonary fibrosis and widespread bronchiectasis. No pneumothorax. No acute consolidative airspace disease. No pleural effusions. No evidence of pulmonary edema. Heart size is normal. Upper mediastinal contours are within normal limits. Aortic atherosclerosis. IMPRESSION: 1. No evidence of recurrent pneumothorax. 2. Advanced fibrotic changes in the lungs redemonstrated, as above. 3. Aortic atherosclerosis. Electronically Signed   By: Vinnie Langton M.D.   On: 08/07/2019 17:09   DG CHEST PORT 1 VIEW  Result Date: 07/23/2019 CLINICAL DATA:  Pneumothorax.  Chest tube removal. EXAM: PORTABLE CHEST 1 VIEW COMPARISON:  July 22, 2019. FINDINGS: The heart size and mediastinal contours are within normal limits. Right-sided chest tube has been removed. No definite pneumothorax is noted. Stable diffuse parenchymal changes are noted consistent with history of sarcoidosis. Mild superimposed subsegmental atelectasis may be present.  Small pleural effusions may be present. The visualized skeletal structures are unremarkable. IMPRESSION: Right-sided chest tube has been removed. No definite pneumothorax is noted. Stable diffuse parenchymal changes are noted consistent with history of sarcoidosis. Electronically Signed   By: Marijo Conception M.D.   On: 07/23/2019 08:27   DG CHEST PORT 1 VIEW  Result Date: 07/22/2019 CLINICAL DATA:  History of pneumothorax.  Chest tube in place. EXAM: PORTABLE CHEST 1 VIEW COMPARISON:  Single-view of the chest 07/21/2026. FINDINGS: Right chest tube remains in place, unchanged. No pneumothorax. Pulmonary parenchymal changes of sarcoidosis are again seen. Heart size is normal. No acute bony abnormality. IMPRESSION: Negative for pneumothorax with a chest tube in place. Pulmonary parenchymal changes of sarcoidosis again seen Electronically Signed   By: Inge Rise M.D.   On: 07/22/2019 13:31   DG CHEST PORT 1 VIEW  Result Date: 07/21/2019 CLINICAL DATA:  Pneumothorax. History of sarcoidosis. Chest tube placement. EXAM: PORTABLE CHEST 1 VIEW COMPARISON:  07/20/2019; 07/19/2019; 07/18/2019; chest CT-03/06/2018 FINDINGS: Grossly unchanged cardiac silhouette and mediastinal contours. Stable position of support apparatus. No definite pneumothorax. There is grossly unchanged diffuse nodular thickening of the pulmonary interstitium with unchanged bibasilar heterogeneous opacities, left greater than right. Trace bilateral effusions are not excluded. No evidence of edema. No acute osseous abnormalities. Degenerative change the bilateral glenohumeral joints is suspected though incompletely evaluated. IMPRESSION: 1. Stable position of right-sided chest tube without evidence of pneumothorax. 2. Similar findings pulmonary  sarcoidosis with architectural distortion and bibasilar atelectasis/scar, left greater than right. Electronically Signed   By: Sandi Mariscal M.D.   On: 07/21/2019 09:30   DG Chest Port 1 View  Result  Date: 07/20/2019 CLINICAL DATA:  Chest tube in place. EXAM: PORTABLE CHEST 1 VIEW COMPARISON:  07/19/2019 FINDINGS: Stable position of the pigtail drain in the right lower chest. Negative for pneumothorax. Patchy parenchymal densities are again noted most prominent at the left lung base and minimally changed. Heart and mediastinum are within normal limits and stable. Atherosclerotic calcifications at the aortic arch. Trachea is midline. No acute bone abnormality. IMPRESSION: 1. Stable position of the right chest tube. Negative for pneumothorax. 2. No significant change in the patchy lung densities particularly in the left lung. Electronically Signed   By: Markus Daft M.D.   On: 07/20/2019 08:58   DG Chest Port 1 View  Result Date: 07/19/2019 CLINICAL DATA:  Pneumothorax, history of sarcoidosis. EXAM: PORTABLE CHEST 1 VIEW COMPARISON:  Radiograph 07/18/2019 FINDINGS: Pigtail pleural drain remains in the periphery of the right lung base. Telemetry leads overlie the chest. No discernible right pneumothorax. Some mild residual right pleural thickening is noted. Stable appearance of the diffuse fibrotic changes throughout both with more basilar cystic changes and biapical pleuroparenchymal scarring. No new consolidative opacity is seen. Stable cardiomediastinal contours. Dense calcified hilar adenopathy. No acute osseous or soft tissue abnormality. IMPRESSION: 1. No discernible right pneumothorax. Right pleural drain remains in place. 2. Otherwise stable appearance of the chest. Electronically Signed   By: Lovena Le M.D.   On: 07/19/2019 05:02   DG CHEST PORT 1 VIEW  Result Date: 07/18/2019 CLINICAL DATA:  Follow-up chest tube EXAM: PORTABLE CHEST 1 VIEW COMPARISON:  07/17/2019 FINDINGS: Right pigtail catheter is again identified and stable. No definitive pneumothorax is seen. Stable fibrotic changes are again identified. Some improved aeration in the left base is seen. No new focal abnormality is noted.  IMPRESSION: Slight improved aeration in the left base. No pneumothorax is identified. Chest tube is noted in satisfactory position. Electronically Signed   By: Inez Catalina M.D.   On: 07/18/2019 08:06   DG CHEST PORT 1 VIEW  Result Date: 07/17/2019 CLINICAL DATA:  Follow-up pneumothorax EXAM: PORTABLE CHEST 1 VIEW COMPARISON:  07/16/2019 FINDINGS: Cardiac shadow is stable. Aortic calcifications are again seen. Chronic fibrotic changes are noted bilaterally and stable. Pigtail catheter is again noted on the right. The previously seen right pneumothorax has resolved in the interval. IMPRESSION: Resolution of right-sided pneumothorax. The remainder of the exam is stable. Electronically Signed   By: Inez Catalina M.D.   On: 07/17/2019 16:26   DG Chest Portable 1 View  Result Date: 07/16/2019 CLINICAL DATA:  Decreased breath sounds on the right following coughing episodes EXAM: PORTABLE CHEST 1 VIEW COMPARISON:  05/02/2019 FINDINGS: Cardiac shadow is stable. Aortic calcifications are again noted. Scattered fibrotic changes are noted in both lungs consistent with the patient's given clinical history of underlying sarcoidosis. There is a new right-sided pneumothorax centered predominately laterally and inferiorly. No bony abnormality is noted. IMPRESSION: Right-sided pneumothorax as described. Critical Value/emergent results were called by telephone at the time of interpretation on 07/16/2019 at 2:30 pm to Dr. Madalyn Rob , who verbally acknowledged these results. Electronically Signed   By: Inez Catalina M.D.   On: 07/16/2019 14:31     Past medical hx Past Medical History:  Diagnosis Date  . Cataract    right eye  . Pneumothorax   .  Sarcoidosis    skin, eye, lung  . Unspecified iridocyclitis      Social History   Tobacco Use  . Smoking status: Never Smoker  . Smokeless tobacco: Never Used  Vaping Use  . Vaping Use: Never used  Substance Use Topics  . Alcohol use: No  . Drug use: No     Lauren Massey reports that she has never smoked. She has never used smokeless tobacco. She reports that she does not drink alcohol and does not use drugs.  Tobacco Cessation: Never smoker  Past surgical hx, Family hx, Social hx all reviewed.  Current Outpatient Medications on File Prior to Visit  Medication Sig  . albuterol (VENTOLIN HFA) 108 (90 Base) MCG/ACT inhaler Inhale 2 puffs into the lungs every 6 (six) hours as needed for wheezing or shortness of breath.  . folic acid (FOLVITE) 1 MG tablet Take 5 mg by mouth at bedtime.   . hydrochlorothiazide (MICROZIDE) 12.5 MG capsule Take 1 capsule (12.5 mg total) by mouth daily.  . methotrexate (RHEUMATREX) 2.5 MG tablet Take 7.5 mg by mouth once a week. Caution:Chemotherapy. Protect from light.  Pt takes 3 tablets every Wednesday.  . prednisoLONE acetate (PRED FORTE) 1 % ophthalmic suspension Place 1 drop into the right eye 2 (two) times daily. Reported on 04/16/2015 (Patient taking differently: Place 1 drop into the right eye 4 (four) times daily. Reported on 04/16/2015)   No current facility-administered medications on file prior to visit.     No Known Allergies  Review Of Systems:  Constitutional:   No  weight loss, night sweats,  Fevers, chills, fatigue, or  lassitude.  HEENT:   No headaches,  Difficulty swallowing,  Tooth/dental problems, or  Sore throat,                No sneezing, itching, ear ache, nasal congestion, post nasal drip,   CV:  No chest pain,  Orthopnea, PND, swelling in lower extremities, anasarca, dizziness, palpitations, syncope.   GI  No heartburn, indigestion, abdominal pain, nausea, vomiting, diarrhea, change in bowel habits, loss of appetite, bloody stools.   Resp: + shortness of breath with exertion less at rest.  No excess mucus, no productive cough,  No non-productive cough,  No coughing up of blood.  No change in color of mucus.  No wheezing.  No chest wall deformity  Skin: no rash or lesions.  GU:  no dysuria, change in color of urine, no urgency or frequency.  No flank pain, no hematuria   MS:  No joint pain or swelling.  No decreased range of motion.  No back pain.  Psych:  No change in mood or affect. No depression or anxiety.  No memory loss.   Vital Signs BP 122/82 (BP Location: Right Arm, Cuff Size: Normal)   Pulse 68   Temp (!) 97.3 F (36.3 C) (Oral)   Ht 5\' 2"  (1.575 m)   Wt 132 lb 6.4 oz (60.1 kg)   SpO2 95%   BMI 24.22 kg/m    Physical Exam:  General- No distress,  A&Ox3, pleasant ENT: No sinus tenderness, TM clear, pale nasal mucosa, no oral exudate,no post nasal drip, no LAN Cardiac: S1, S2, regular rate and rhythm, no murmur Chest: No wheeze/ rales/ dullness; no accessory muscle use, no nasal flaring, no sternal retractions Abd.: Soft Non-tender, ND, BS + Ext: No clubbing cyanosis, edema Neuro:  normal strength, MAE x 4, A&O x 3 Skin: No rashes, No lesions, warm and dry Psych:  normal mood and behavior   Assessment/Plan  Resolved Right Pneumothorax after chest tube and pleurodesis Plan CXR today to ensure pneumothorax has not recurred We will walk you in the office today to ensure you are oxygenating adequately Continue to slowly increase activity level We will write you a letter for out of work through 7/30. Follow up with Dr. Carlis Abbott or Judson Roch NP the week of 7/26 at 4 pm if available to re-evaluate if you are ready to return to work. Make sure FMLA paperwork goes to [place].   Chronic hypoxic Respiratory Failure Walked in office today, with no need for supplemental oxygen with exertion Plan Continue wearing your oxygen at night at 1.5 liters as you have been doing. Oxygen saturation goal  > 88%   Sarcoid/ ILD Plan Continue methotrexate  Follow up the week of 7/26 for re-evaluation of return to work  This appointment was 30 min long with over 50% of the time in direct face-to-face patient care, assessment, plan of care, and follow-up.   Magdalen Spatz, NP 08/08/2019  4:39 PM

## 2019-08-07 NOTE — Patient Instructions (Addendum)
It is good to see you today. We will order a CXR today. This will be done at the [place] office at [address] ( [place] old office, in the basement) We will write you a letter for out of work through 7/30. Follow up with Dr. Carlis Abbott or Judson Roch NP the week of 7/26 at 4 pm if available to re-evaluate if you are ready to return to work. Continue wearing your oxygen at night at 1.5 liters as you have been doing. We will walk you today to monitor your oxygen saturations with exertion.  Make sure FMLA paperwork goes to [place].  Please contact office for sooner follow up if symptoms do not improve or worsen or seek emergency care

## 2019-08-08 ENCOUNTER — Encounter: Payer: Self-pay | Admitting: Acute Care

## 2019-08-08 NOTE — Telephone Encounter (Signed)
LMTCB x1 for Almyra Free.

## 2019-08-08 NOTE — Progress Notes (Signed)
Please call patient and let her know her CXR show no recurrent pneumothorax. This is good news. We will re-evaluate in 2 weeks as scheduled.

## 2019-08-09 NOTE — Telephone Encounter (Signed)
Left vm x2 for Almyra Free.

## 2019-08-12 NOTE — Telephone Encounter (Signed)
LMTCB x 3 and will close per protocol 

## 2019-08-21 ENCOUNTER — Ambulatory Visit (INDEPENDENT_AMBULATORY_CARE_PROVIDER_SITE_OTHER): Payer: 59

## 2019-08-21 ENCOUNTER — Other Ambulatory Visit: Payer: Self-pay

## 2019-08-21 ENCOUNTER — Encounter (HOSPITAL_COMMUNITY): Payer: Self-pay | Admitting: Emergency Medicine

## 2019-08-21 ENCOUNTER — Telehealth: Payer: Self-pay | Admitting: Internal Medicine

## 2019-08-21 ENCOUNTER — Inpatient Hospital Stay (HOSPITAL_COMMUNITY)
Admission: EM | Admit: 2019-08-21 | Discharge: 2019-08-28 | DRG: 163 | Disposition: A | Discharge status: Home / Self Care | Payer: 59 | Attending: Internal Medicine | Admitting: Internal Medicine

## 2019-08-21 ENCOUNTER — Ambulatory Visit: Payer: 59 | Admitting: Acute Care

## 2019-08-21 ENCOUNTER — Encounter: Payer: Self-pay | Admitting: Acute Care

## 2019-08-21 VITALS — BP 130/70 | HR 76 | Temp 97.5°F | Ht 62.0 in | Wt 129.4 lb

## 2019-08-21 DIAGNOSIS — Z4682 Encounter for fitting and adjustment of non-vascular catheter: Secondary | ICD-10-CM

## 2019-08-21 DIAGNOSIS — J8410 Pulmonary fibrosis, unspecified: Secondary | ICD-10-CM | POA: Diagnosis present

## 2019-08-21 DIAGNOSIS — J939 Pneumothorax, unspecified: Secondary | ICD-10-CM

## 2019-08-21 DIAGNOSIS — D869 Sarcoidosis, unspecified: Secondary | ICD-10-CM | POA: Diagnosis present

## 2019-08-21 DIAGNOSIS — Z79899 Other long term (current) drug therapy: Secondary | ICD-10-CM

## 2019-08-21 DIAGNOSIS — R05 Cough: Secondary | ICD-10-CM

## 2019-08-21 DIAGNOSIS — J849 Interstitial pulmonary disease, unspecified: Secondary | ICD-10-CM | POA: Diagnosis not present

## 2019-08-21 DIAGNOSIS — Z9049 Acquired absence of other specified parts of digestive tract: Secondary | ICD-10-CM | POA: Diagnosis not present

## 2019-08-21 DIAGNOSIS — J9383 Other pneumothorax: Secondary | ICD-10-CM | POA: Diagnosis present

## 2019-08-21 DIAGNOSIS — D649 Anemia, unspecified: Secondary | ICD-10-CM | POA: Diagnosis present

## 2019-08-21 DIAGNOSIS — R0902 Hypoxemia: Secondary | ICD-10-CM

## 2019-08-21 DIAGNOSIS — D860 Sarcoidosis of lung: Secondary | ICD-10-CM

## 2019-08-21 DIAGNOSIS — R059 Cough, unspecified: Secondary | ICD-10-CM

## 2019-08-21 DIAGNOSIS — I10 Essential (primary) hypertension: Secondary | ICD-10-CM

## 2019-08-21 DIAGNOSIS — Z20822 Contact with and (suspected) exposure to covid-19: Secondary | ICD-10-CM | POA: Diagnosis present

## 2019-08-21 DIAGNOSIS — R739 Hyperglycemia, unspecified: Secondary | ICD-10-CM | POA: Diagnosis not present

## 2019-08-21 DIAGNOSIS — J9601 Acute respiratory failure with hypoxia: Secondary | ICD-10-CM | POA: Diagnosis present

## 2019-08-21 DIAGNOSIS — Z8249 Family history of ischemic heart disease and other diseases of the circulatory system: Secondary | ICD-10-CM

## 2019-08-21 DIAGNOSIS — J9311 Primary spontaneous pneumothorax: Secondary | ICD-10-CM | POA: Diagnosis not present

## 2019-08-21 LAB — CBC WITH DIFFERENTIAL/PLATELET
Abs Immature Granulocytes: 0.01 10*3/uL (ref 0.00–0.07)
Basophils Absolute: 0 10*3/uL (ref 0.0–0.1)
Basophils Relative: 1 %
Eosinophils Absolute: 0.3 10*3/uL (ref 0.0–0.5)
Eosinophils Relative: 6 %
HCT: 38.1 % (ref 36.0–46.0)
Hemoglobin: 11.7 g/dL — ABNORMAL LOW (ref 12.0–15.0)
Immature Granulocytes: 0 %
Lymphocytes Relative: 34 %
Lymphs Abs: 1.3 10*3/uL (ref 0.7–4.0)
MCH: 27.9 pg (ref 26.0–34.0)
MCHC: 30.7 g/dL (ref 30.0–36.0)
MCV: 90.9 fL (ref 80.0–100.0)
Monocytes Absolute: 0.5 10*3/uL (ref 0.1–1.0)
Monocytes Relative: 12 %
Neutro Abs: 1.8 10*3/uL (ref 1.7–7.7)
Neutrophils Relative %: 47 %
Platelets: 183 10*3/uL (ref 150–400)
RBC: 4.19 MIL/uL (ref 3.87–5.11)
RDW: 13.8 % (ref 11.5–15.5)
WBC: 3.9 10*3/uL — ABNORMAL LOW (ref 4.0–10.5)
nRBC: 0 % (ref 0.0–0.2)

## 2019-08-21 LAB — COMPREHENSIVE METABOLIC PANEL
ALT: 20 U/L (ref 0–44)
AST: 26 U/L (ref 15–41)
Albumin: 3.2 g/dL — ABNORMAL LOW (ref 3.5–5.0)
Alkaline Phosphatase: 62 U/L (ref 38–126)
Anion gap: 8 (ref 5–15)
BUN: 21 mg/dL (ref 8–23)
CO2: 27 mmol/L (ref 22–32)
Calcium: 9.4 mg/dL (ref 8.9–10.3)
Chloride: 102 mmol/L (ref 98–111)
Creatinine, Ser: 0.89 mg/dL (ref 0.44–1.00)
GFR calc Af Amer: >60 mL/min (ref >60)
GFR calc non Af Amer: >60 mL/min (ref >60)
Glucose, Bld: 95 mg/dL (ref 70–99)
Potassium: 3.6 mmol/L (ref 3.5–5.1)
Sodium: 137 mmol/L (ref 135–145)
Total Bilirubin: 0.5 mg/dL (ref 0.3–1.2)
Total Protein: 9 g/dL — ABNORMAL HIGH (ref 6.5–8.1)

## 2019-08-21 LAB — SARS CORONAVIRUS 2 BY RT PCR (HOSPITAL ORDER, PERFORMED IN ~~LOC~~ HOSPITAL LAB): SARS Coronavirus 2: NEGATIVE

## 2019-08-21 MED ORDER — FOLIC ACID 1 MG PO TABS
5.0000 mg | ORAL_TABLET | Freq: Every day | ORAL | Status: DC
  Administered 2019-08-21 – 2019-08-27 (×7): 5 mg via ORAL
  Filled 2019-08-21 (×7): qty 5

## 2019-08-21 MED ORDER — PREDNISOLONE ACETATE 1 % OP SUSP
1.0000 [drp] | Freq: Four times a day (QID) | OPHTHALMIC | Status: DC
  Administered 2019-08-22 – 2019-08-28 (×20): 1 [drp] via OPHTHALMIC
  Filled 2019-08-21 (×2): qty 5

## 2019-08-21 MED ORDER — METHOTREXATE 2.5 MG PO TABS
7.5000 mg | ORAL_TABLET | ORAL | Status: DC
  Administered 2019-08-21 – 2019-08-28 (×2): 7.5 mg via ORAL
  Filled 2019-08-21 (×2): qty 3

## 2019-08-21 MED ORDER — HYDROCHLOROTHIAZIDE 12.5 MG PO CAPS
12.5000 mg | ORAL_CAPSULE | Freq: Every day | ORAL | Status: DC
  Administered 2019-08-21 – 2019-08-22 (×2): 12.5 mg via ORAL
  Filled 2019-08-21 (×2): qty 1

## 2019-08-21 MED ORDER — ALBUTEROL SULFATE (2.5 MG/3ML) 0.083% IN NEBU: 2.5000 mg | INHALATION_SOLUTION | Freq: Four times a day (QID) | RESPIRATORY_TRACT | Status: DC | PRN

## 2019-08-21 MED ORDER — ONDANSETRON HCL 4 MG PO TABS: 4.0000 mg | ORAL_TABLET | Freq: Four times a day (QID) | ORAL | Status: DC | PRN

## 2019-08-21 MED ORDER — ONDANSETRON HCL 4 MG/2ML IJ SOLN: 4.0000 mg | Freq: Four times a day (QID) | INTRAMUSCULAR | Status: DC | PRN

## 2019-08-21 NOTE — Patient Instructions (Addendum)
It is good to see you today. Your oxygen levels with exertion are too low. We will get a CXR now. CXR shows recurrent pneumothorax on the right.  We will call EMS and have you transported to [facility] for chest tube insertion. We will continue the oxygen for transport to [facility]. Follow up after discharge with Dr. Carlis Abbott Follow up after discharge with Dr. Kipp Brood. Please contact office for sooner follow up if symptoms do not improve or worsen or seek emergency care

## 2019-08-21 NOTE — ED Provider Notes (Signed)
[facility] EMERGENCY DEPARTMENT Provider Note   CSN: [id number] Arrival date & time: 08/21/19  1814     History Chief Complaint  Patient presents with  . Shortness of Breath    Lauren Massey is a 64 y.o. female.  HPI      64yo female with history of chronic fibrotic sarcoidosis on methotrexate, pneumothoraces x 3 (January 29, 2/16 at which time she had robotic thorascopy, pleurectomy and pluerodesis with Dr. Servando Snare, 6/22 admission) on chronic nocturnal O2 1.5L [name], presents after being seen in pulmonology office today and found to have saturation 85% on room air and outpatient XR showing recurrence of right sided pneumothorax.   Reports she has continuing coughing since most recent admission.  Was laying down in bed a few days ago and coughed and noted some mild increased shortness of breath. Reports she thought maybe she could have recurrence of pneumothorax but she did not have chest pain and shortness of breath was not as bad as it had been previously so put off evaluation, thinking maybe she had been "overdoing it."  Reports continuing cough, mild dyspnea. Feels at baseline now. Walked into office this AM without O2, found to have saturation 85% and they recommended XR given hx.  XR performed showed pneumothorax estimated 20-25%.    Past Medical History:  Diagnosis Date  . Cataract    right eye  . Pneumothorax   . Sarcoidosis    skin, eye, lung  . Unspecified iridocyclitis     Patient Active Problem List   Diagnosis Date Noted  . ILD (interstitial lung disease) ([place]) 05/31/2019  . Essential hypertension 04/12/2019  . Pneumothorax 02/22/2019  . Chronic respiratory failure with hypoxia ([place]) 03/17/2017  . Routine general medical examination at a health care facility 02/02/2017  . Loss of weight 08/03/2015  . SARCOIDOSIS, PULMONARY 05/18/2007  . Iridocyclitis 05/18/2007    Past Surgical History:  Procedure Laterality Date  . ABDOMINAL  HYSTERECTOMY    . APPENDECTOMY    . BRONCHOSCOPY  2003  . CATARACT EXTRACTION    . INTERCOSTAL NERVE BLOCK Right 03/12/2019   Procedure: Intercostal Nerve Block;  Surgeon: Grace Isaac, MD;  Location: [facility];  Service: Thoracic;  Laterality: Right;  Marland Kitchen VESICOVAGINAL FISTULA CLOSURE W/ TAH    . VIDEO BRONCHOSCOPY N/A 03/12/2019   Procedure: Video Bronchoscopy;  Surgeon: Grace Isaac, MD;  Location: [facility];  Service: Thoracic;  Laterality: N/A;     OB History   No obstetric history on file.     Family History  Problem Relation Age of Onset  . Alzheimer's disease Mother   . Cancer Father   . Heart disease Father   . Diabetes Other   . Colon cancer Neg Hx   . Esophageal cancer Neg Hx   . Pancreatic cancer Neg Hx   . Stomach cancer Neg Hx     Social History   Tobacco Use  . Smoking status: Never Smoker  . Smokeless tobacco: Never Used  Vaping Use  . Vaping Use: Never used  Substance Use Topics  . Alcohol use: No  . Drug use: No    Home Medications Prior to Admission medications   Medication Sig Start Date End Date Taking? Authorizing Provider  albuterol (VENTOLIN HFA) 108 (90 Base) MCG/ACT inhaler Inhale 2 puffs into the lungs every 6 (six) hours as needed for wheezing or shortness of breath. 02/08/19   Deneise Lever, MD  folic acid (FOLVITE) 1 MG  tablet Take 5 mg by mouth at bedtime.     [provider]  hydrochlorothiazide (MICROZIDE) 12.5 MG capsule Take 1 capsule (12.5 mg total) by mouth daily. 04/12/19   Hoyt Koch, MD  methotrexate (RHEUMATREX) 2.5 MG tablet Take 7.5 mg by mouth once a week. Caution:Chemotherapy. Protect from light.  Pt takes 3 tablets every Wednesday.    [provider]  prednisoLONE acetate (PRED FORTE) 1 % ophthalmic suspension Place 1 drop into the right eye 2 (two) times daily. Reported on 04/16/2015 Patient taking differently: Place 1 drop into the right eye 4 (four) times daily. Reported on 04/16/2015  04/16/15   Robyn Haber, MD    Allergies    Patient has no known allergies.  Review of Systems   Review of Systems  Constitutional: Negative for fever.  HENT: Negative for sore throat.   Eyes: Negative for visual disturbance.  Respiratory: Positive for cough and shortness of breath.   Cardiovascular: Negative for chest pain.  Gastrointestinal: Negative for abdominal pain.  Genitourinary: Negative for difficulty urinating.  Musculoskeletal: Negative for back pain and neck pain.  Skin: Negative for rash.  Neurological: Negative for syncope and headaches.    Physical Exam Updated Vital Signs BP 127/85   Pulse 73   Temp 98.6 F (37 C)   Resp 20   Ht 5\' 2"  (1.575 m)   Wt 58.5 kg   SpO2 100%   BMI 23.59 kg/m   Physical Exam Vitals and nursing note reviewed.  Constitutional:      General: She is not in acute distress.    Appearance: She is well-developed. She is not diaphoretic.  HENT:     Head: Normocephalic and atraumatic.  Eyes:     Conjunctiva/sclera: Conjunctivae normal.  Cardiovascular:     Rate and Rhythm: Normal rate and regular rhythm.     Heart sounds: Normal heart sounds. No murmur heard.  No friction rub. No gallop.   Pulmonary:     Effort: Pulmonary effort is normal. No respiratory distress.     Breath sounds: Decreased breath sounds present. No wheezing or rales.  Abdominal:     General: There is no distension.     Palpations: Abdomen is soft.     Tenderness: There is no abdominal tenderness. There is no guarding.  Musculoskeletal:        General: No tenderness.     Cervical back: Normal range of motion.  Skin:    General: Skin is warm and dry.     Findings: No erythema or rash.  Neurological:     Mental Status: She is alert and oriented to person, place, and time.     ED Results / Procedures / Treatments   Labs (all labs ordered are listed, but only abnormal results are displayed) Labs Reviewed  CBC WITH DIFFERENTIAL/PLATELET - Abnormal;  Notable for the following components:      Result Value   WBC 3.9 (*)    Hemoglobin 11.7 (*)    All other components within normal limits  COMPREHENSIVE METABOLIC PANEL - Abnormal; Notable for the following components:   Total Protein 9.0 (*)    Albumin 3.2 (*)    All other components within normal limits  SARS CORONAVIRUS 2 BY RT PCR [facility] ORDER, [place] LAB)    EKG EKG Interpretation  Date/Time:  Wednesday August 21 2019 18:26:40 EDT Ventricular Rate:  69 PR Interval:    QRS Duration: 80 QT Interval:  416 QTC  Calculation: 446 R Axis:   17 Text Interpretation: Sinus rhythm Left atrial enlargement Left ventricular hypertrophy No significant change since last tracing Confirmed by Gareth Morgan [phone]) on 08/21/2019 8:04:13 PM   Radiology DG Chest 2 View  Result Date: 08/21/2019 CLINICAL DATA:  Cough and shortness of breath. Interstitial lung disease. EXAM: CHEST - 2 VIEW COMPARISON:  08/07/2019 FINDINGS: Heart size is normal. Advanced chronic interstitial lung disease with pulmonary scarring. Acute pneumothorax on the right estimated at 20-25%. No evidence of tension. Lung markings are in general slightly more prominent, which could go along with some acute inflammation. No dense consolidation or lobar collapse. IMPRESSION: Chronic lung disease. Pneumothorax on the right estimated at 20-25%. Increase pulmonary markings could possibly indicate some acute inflammation superimposed upon the chronic lung disease. Call report in progress. Electronically Signed   By: Nelson Chimes M.D.   On: 08/21/2019 17:29    Procedures Procedures (including critical care time)  Medications Ordered in ED Medications - No data to display  ED Course  I have reviewed the triage vital signs and the nursing notes.  Pertinent labs & imaging results that were available during my care of the patient were reviewed by me and considered in my medical decision making (see chart for  details).    MDM Rules/Calculators/A&P                          64yo female with history of chronic fibrotic sarcoidosis on methotrexate, pneumothoraces x 3 (January 29, 2/16 at which time she had robotic thorascopy, pleurectomy and pluerodesis with Dr. Servando Snare, 6/22 admission) on chronic nocturnal O2 1.5L [place], presents after being seen in pulmonology office today and found to have saturation 85% on room air and outpatient XR showing recurrence of right sided pneumothorax.  She is in no respiratory distress with normal blood pressures, heart rate, and feeling well on oxygen in the ED.  Discussed with Dr. Kipp Brood of CT surgery. Will admit to hospitalist service with CT surgery consult and continued monitoring.   Final Clinical Impression(s) / ED Diagnoses Final diagnoses:  Pneumothorax on right    Rx / DC Orders ED Discharge Orders    None       Gareth Morgan, MD 08/21/19 2018

## 2019-08-21 NOTE — Telephone Encounter (Signed)
I tried calling and the system was automated and asked for pt ID and group number  This was unfortunately not provided  Not able to speak with a person who could help  Will close encounter

## 2019-08-21 NOTE — H&P (Signed)
History and Physical    Lauren Massey DPO:[id number] DOB: [birth year] DOA: 08/21/2019  PCP: Hoyt Koch, MD  Patient coming from: Home.  Chief Complaint: Shortness of breath.  HPI: Lauren Massey is a 64 y.o. female with history of sarcoidosis who has had recurrent pneumothorax and underwent chemical pleurodesis last month had followed up with pulmonologist today and at the pulmonologist office patient was complaining of shortness of breath and increasing cough and was found to be hypoxic and had chest x-ray done showed right-sided pneumothorax and was referred to the ER.  Patient also had mild chest pain on deep inspiration.  Denies fever chills or any dizziness palpitations.  ED Course: In the ER patient was hemodynamically stable requiring 3 L oxygen to maintain sats.  Chest x-ray confirms right-sided 20 to 25% pneumothorax.  ER physician discussed with on-call cardiothoracic surgeon Dr. Kipp Brood who will be seeing patient in consult.  Labs show hemoglobin of 11.7 WBC 3.9 Covid test negative EKG shows normal sinus rhythm.  Review of Systems: As per HPI, rest all negative.   Past Medical History:  Diagnosis Date   Cataract    right eye   Pneumothorax    Sarcoidosis    skin, eye, lung   Unspecified iridocyclitis     Past Surgical History:  Procedure Laterality Date   ABDOMINAL HYSTERECTOMY     APPENDECTOMY     BRONCHOSCOPY  2003   CATARACT EXTRACTION     INTERCOSTAL NERVE BLOCK Right 03/12/2019   Procedure: Intercostal Nerve Block;  Surgeon: Grace Isaac, MD;  Location: [facility];  Service: Thoracic;  Laterality: Right;   VESICOVAGINAL FISTULA CLOSURE W/ TAH     VIDEO BRONCHOSCOPY N/A 03/12/2019   Procedure: Video Bronchoscopy;  Surgeon: Grace Isaac, MD;  Location: [facility];  Service: Thoracic;  Laterality: N/A;     reports that she has never smoked. She has never used smokeless tobacco. She reports that she does not drink alcohol and does not  use drugs.  No Known Allergies  Family History  Problem Relation Age of Onset   Alzheimer's disease Mother    Cancer Father    Heart disease Father    Diabetes Other    Colon cancer Neg Hx    Esophageal cancer Neg Hx    Pancreatic cancer Neg Hx    Stomach cancer Neg Hx     Prior to Admission medications   Medication Sig Start Date End Date Taking? Authorizing Provider  albuterol (VENTOLIN HFA) 108 (90 Base) MCG/ACT inhaler Inhale 2 puffs into the lungs every 6 (six) hours as needed for wheezing or shortness of breath. 02/08/19  Yes Young, Tarri Fuller D, MD  folic acid (FOLVITE) 1 MG tablet Take 5 mg by mouth at bedtime.    Yes [provider]  hydrochlorothiazide (MICROZIDE) 12.5 MG capsule Take 1 capsule (12.5 mg total) by mouth daily. 04/12/19  Yes Hoyt Koch, MD  methotrexate (RHEUMATREX) 2.5 MG tablet Take 7.5 mg by mouth once a week. Caution:Chemotherapy. Protect from light.  Pt takes 3 tablets every Wednesday.   Yes [provider]  prednisoLONE acetate (PRED FORTE) 1 % ophthalmic suspension Place 1 drop into the right eye 2 (two) times daily. Reported on 04/16/2015 Patient taking differently: Place 1 drop into the right eye 4 (four) times daily. Reported on 04/16/2015 04/16/15  Yes Robyn Haber, MD    Physical Exam: Constitutional: Moderately built and nourished. Vitals:   08/21/19 1825 08/21/19 1826 08/21/19 1830  08/21/19 2045  BP: (!) 145/89  127/85 127/82  Pulse: 72  73 65  Resp: 20  20 19   Temp: 98.6 F (37 C)     SpO2: 100%  100% 100%  Weight:  58.5 kg    Height:  5\' 2"  (1.575 m)     Eyes: Anicteric no pallor. ENMT: No discharge from the ears eyes nose or mouth. Neck: No mass felt.  No neck rigidity. Respiratory: No rhonchi or crepitations. Cardiovascular: S1-S2 heard. Abdomen: Soft nontender bowel sounds present. Musculoskeletal: No edema. Skin: No rash. Neurologic: Alert awake oriented to time place and person.  Moves all  extremities. Psychiatric: Appears normal.  Normal affect.   Labs on Admission: I have personally reviewed following labs and imaging studies  CBC: Recent Labs  Lab 08/21/19 1844  WBC 3.9*  NEUTROABS 1.8  HGB 11.7*  HCT 38.1  MCV 90.9  PLT 947   Basic Metabolic Panel: Recent Labs  Lab 08/21/19 1844  NA 137  K 3.6  CL 102  CO2 27  GLUCOSE 95  BUN 21  CREATININE 0.89  CALCIUM 9.4   GFR: Estimated Creatinine Clearance: 50.5 mL/min (by C-G formula based on SCr of 0.89 mg/dL). Liver Function Tests: Recent Labs  Lab 08/21/19 1844  AST 26  ALT 20  ALKPHOS 62  BILITOT 0.5  PROT 9.0*  ALBUMIN 3.2*   No results for input(s): LIPASE, AMYLASE in the last 168 hours. No results for input(s): AMMONIA in the last 168 hours. Coagulation Profile: No results for input(s): INR, PROTIME in the last 168 hours. Cardiac Enzymes: No results for input(s): CKTOTAL, CKMB, CKMBINDEX, TROPONINI in the last 168 hours. BNP (last 3 results) No results for input(s): PROBNP in the last 8760 hours. HbA1C: No results for input(s): HGBA1C in the last 72 hours. CBG: No results for input(s): GLUCAP in the last 168 hours. Lipid Profile: No results for input(s): CHOL, HDL, LDLCALC, TRIG, CHOLHDL, LDLDIRECT in the last 72 hours. Thyroid Function Tests: No results for input(s): TSH, T4TOTAL, FREET4, T3FREE, THYROIDAB in the last 72 hours. Anemia Panel: No results for input(s): VITAMINB12, FOLATE, FERRITIN, TIBC, IRON, RETICCTPCT in the last 72 hours. Urine analysis:    Component Value Date/Time   COLORURINE AMBER (A) 04/24/2015 1116   APPEARANCEUR CLOUDY (A) 04/24/2015 1116   LABSPEC 1.016 04/24/2015 1116   PHURINE 6.5 04/24/2015 1116   GLUCOSEU NEGATIVE 04/24/2015 1116   HGBUR TRACE (A) 04/24/2015 1116   BILIRUBINUR negative 11/02/2017 1047   KETONESUR NEGATIVE 04/24/2015 1116   PROTEINUR Negative 11/02/2017 1047   PROTEINUR 30 (A) 04/24/2015 1116   UROBILINOGEN 0.2 11/02/2017 1047    NITRITE negative 11/02/2017 1047   NITRITE NEGATIVE 04/24/2015 1116   LEUKOCYTESUR Large (3+) (A) 11/02/2017 1047   Sepsis Labs: @LABRCNTIP (procalcitonin:4,lacticidven:4) ) Recent Results (from the past 240 hour(s))  SARS Coronavirus 2 by RT PCR (hospital order, performed in [place] hospital lab) Nasopharyngeal Nasopharyngeal Swab     Status: None   Collection Time: 08/21/19  7:00 PM   Specimen: Nasopharyngeal Swab  Result Value Ref Range Status   SARS Coronavirus 2 NEGATIVE NEGATIVE Final    Comment: (NOTE) SARS-CoV-2 target nucleic acids are NOT DETECTED.  The SARS-CoV-2 RNA is generally detectable in upper and lower respiratory specimens during the acute phase of infection. The lowest concentration of SARS-CoV-2 viral copies this assay can detect is 250 copies / mL. A negative result does not preclude SARS-CoV-2 infection and should not be used as the sole basis  for treatment or other patient management decisions.  A negative result may occur with improper specimen collection / handling, submission of specimen other than nasopharyngeal swab, presence of viral mutation(s) within the areas targeted by this assay, and inadequate number of viral copies (<250 copies / mL). A negative result must be combined with clinical observations, patient history, and epidemiological information.  Fact Sheet for Patients:   StrictlyIdeas.no  Fact Sheet for Healthcare Providers: BankingDealers.co.za  This test is not yet approved or  cleared by the [place] FDA and has been authorized for detection and/or diagnosis of SARS-CoV-2 by FDA under an Emergency Use Authorization (EUA).  This EUA will remain in effect (meaning this test can be used) for the duration of the COVID-19 declaration under Section 564(b)(1) of the Act, 21 U.S.C. section 360bbb-3(b)(1), unless the authorization is terminated or revoked sooner.  Performed at [facility], [place] [address] [place], [place] [address]      Radiological Exams on Admission: DG Chest 2 View  Result Date: 08/21/2019 CLINICAL DATA:  Cough and shortness of breath. Interstitial lung disease. EXAM: CHEST - 2 VIEW COMPARISON:  08/07/2019 FINDINGS: Heart size is normal. Advanced chronic interstitial lung disease with pulmonary scarring. Acute pneumothorax on the right estimated at 20-25%. No evidence of tension. Lung markings are in general slightly more prominent, which could go along with some acute inflammation. No dense consolidation or lobar collapse. IMPRESSION: Chronic lung disease. Pneumothorax on the right estimated at 20-25%. Increase pulmonary markings could possibly indicate some acute inflammation superimposed upon the chronic lung disease. Call report in progress. Electronically Signed   By: Nelson Chimes M.D.   On: 08/21/2019 17:29    EKG: Independently reviewed.  Normal sinus rhythm.  Assessment/Plan Principal Problem:   Acute respiratory failure with hypoxia (HCC) Active Problems:   SARCOIDOSIS, PULMONARY   Pneumothorax   Pneumothorax on right   Essential hypertension    1. Acute respiratory failure with hypoxia secondary to recurrent pneumothorax at this time affecting the right side with 20 to 25% involvement.  Patient has had recent chemical pleurodesis of the right side last month.  On-call cardiothoracic surgeon has been notified.  We will keep patient n.p.o. past midnight.  Continue to monitor closely. 2. History of sarcoidosis on methotrexate 7.5 mg weekly. 3. Hypertension on hydrochlorothiazide. 4. Anemia follow CBC.  Since patient has pneumothorax will need close monitoring for any further worsening in inpatient status.   DVT prophylaxis: SCDs.  Avoiding anticoagulation in anticipation of procedure. Code Status: Full code. Family Communication: Patient's daughter at the bedside. Disposition Plan: Home when stable. Consults called: Cardiothoracic  surgery. Admission status: Inpatient.   Rise Patience MD [facility] Pager [phone].  If 7PM-7AM, please contact night-coverage [email or web address] Password [facility]  08/21/2019, 9:43 PM

## 2019-08-21 NOTE — Progress Notes (Signed)
History of Present Illness Lauren Massey is a 64 y.o. female  never smoker followed for chronic fibrotic sarcoidosis with multisystem involvement including lungs, skin, eyes-diagnosed via bronchoscopy 2000. On weekly methotrexate.  64 year old female never smoker with history of sarcoid, interstitial lung disease, pneumothoraces x2 status post robotic thorascopic intervention was pleurodesis in February 2021 per cardiovascular thoracic surgeon Dr. Servando Snare.   08/21/2019 Follow Up: Pt. Presents for follow up. She states she has been coughing a significant amount since her last visit. . She is currently on 7.5  mg of methotrexate every Wednesday.  Upon walking into the office, she had sats of 85%. She does not appear to be in any distress. With her history of spontaneous pneumothorax, and her new history of cough, we will do a CXR to ensure she has not had re currrance. It is also possible she has had progression of her ILD, and this is the reason for her hypoxia. She has been placed on oxygen, and sats are currently 92%.CXR is pending.She gets tachycardia with minimal exertion.   CXR positive for right pneumothorax. EMS has been called for transport to [facility] ED. I have called ahead and spoken to the ED Triage nurse. They are setting up for [facility] insertion on the right. The patient is hemodynamically stable.  BP and pulse are stable at rest. I have explained the above to patient and her husband and sister, all who are in the exam room.Patient is very upset she will need another chest tube.  Patient remains on oxygen at 4 L, sats are 93%. EMS will transport on telemetry.  Test Results: 08/21/2019 CXR Heart size is normal. Advanced chronic interstitial lung disease with pulmonary scarring. Acute pneumothorax on the right estimated at 20-25%. No evidence of tension. Lung markings are in general slightly more prominent, which could go along with some acute inflammation. No dense consolidation  or lobar collapse.  IMPRESSION: Chronic lung disease. Pneumothorax on the right estimated at 20-25%. Increase pulmonary markings could possibly indicate some acute inflammation superimposed upon the chronic lung disease.  CBC Latest Ref Rng & Units 07/20/2019 07/17/2019 07/16/2019  WBC 4.0 - 10.5 K/uL 4.2 3.8(L) 4.4  Hemoglobin 12.0 - 15.0 g/dL 12.5 11.8(L) 12.7  Hematocrit 36 - 46 % 41.0 38.9 41.7  Platelets 150 - 400 K/uL 179 147(L) 154    BMP Latest Ref Rng & Units 07/20/2019 07/17/2019 07/16/2019  Glucose 70 - 99 mg/dL 90 94 91  BUN 8 - 23 mg/dL 23 16 18   Creatinine 0.44 - 1.00 mg/dL 0.83 0.74 0.78  Sodium 135 - 145 mmol/L 134(L) 138 137  Potassium 3.5 - 5.1 mmol/L 4.7 4.1 3.7  Chloride 98 - 111 mmol/L 96(L) 102 101  CO2 22 - 32 mmol/L 27 29 27   Calcium 8.9 - 10.3 mg/dL 9.0 9.4 9.2    BNP    Component Value Date/Time   BNP 121.4 (H) 03/08/2019 1341    ProBNP    Component Value Date/Time   PROBNP 26.0 05/12/2016 1546    PFT    Component Value Date/Time   FEV1PRE 1.42 03/16/2017 0900   FEV1POST 1.46 03/16/2017 0900   FVCPRE 1.63 03/16/2017 0900   FVCPOST 1.62 03/16/2017 0900   TLC 2.43 03/16/2017 0900   DLCOUNC 5.87 03/16/2017 0900   PREFEV1FVCRT 87 03/16/2017 0900   PSTFEV1FVCRT 90 03/16/2017 0900    DG Chest 2 View  Result Date: 08/07/2019 CLINICAL DATA:  64 year old female with history of shortness of breath. History of  right-sided pneumothorax on 07/16/2019. EXAM: CHEST - 2 VIEW COMPARISON:  Chest x-ray 07/23/2019. FINDINGS: Lung volumes are low and there are diffuse reticulonodular opacities throughout the lungs bilaterally, similar to prior examinations, compatible with underlying pulmonary fibrosis and widespread bronchiectasis. No pneumothorax. No acute consolidative airspace disease. No pleural effusions. No evidence of pulmonary edema. Heart size is normal. Upper mediastinal contours are within normal limits. Aortic atherosclerosis. IMPRESSION: 1. No  evidence of recurrent pneumothorax. 2. Advanced fibrotic changes in the lungs redemonstrated, as above. 3. Aortic atherosclerosis. Electronically Signed   By: Vinnie Langton M.D.   On: 08/07/2019 17:09   DG CHEST PORT 1 VIEW  Result Date: 07/23/2019 CLINICAL DATA:  Pneumothorax.  Chest tube removal. EXAM: PORTABLE CHEST 1 VIEW COMPARISON:  July 22, 2019. FINDINGS: The heart size and mediastinal contours are within normal limits. Right-sided chest tube has been removed. No definite pneumothorax is noted. Stable diffuse parenchymal changes are noted consistent with history of sarcoidosis. Mild superimposed subsegmental atelectasis may be present. Small pleural effusions may be present. The visualized skeletal structures are unremarkable. IMPRESSION: Right-sided chest tube has been removed. No definite pneumothorax is noted. Stable diffuse parenchymal changes are noted consistent with history of sarcoidosis. Electronically Signed   By: Marijo Conception M.D.   On: 07/23/2019 08:27     Past medical hx Past Medical History:  Diagnosis Date  . Cataract    right eye  . Pneumothorax   . Sarcoidosis    skin, eye, lung  . Unspecified iridocyclitis      Social History   Tobacco Use  . Smoking status: Never Smoker  . Smokeless tobacco: Never Used  Vaping Use  . Vaping Use: Never used  Substance Use Topics  . Alcohol use: No  . Drug use: No    Ms.Defrain reports that she has never smoked. She has never used smokeless tobacco. She reports that she does not drink alcohol and does not use drugs.  Tobacco Cessation: Never smoker  Past surgical hx, Family hx, Social hx all reviewed.  Current Outpatient Medications on File Prior to Visit  Medication Sig  . albuterol (VENTOLIN HFA) 108 (90 Base) MCG/ACT inhaler Inhale 2 puffs into the lungs every 6 (six) hours as needed for wheezing or shortness of breath.  . folic acid (FOLVITE) 1 MG tablet Take 5 mg by mouth at bedtime.   .  hydrochlorothiazide (MICROZIDE) 12.5 MG capsule Take 1 capsule (12.5 mg total) by mouth daily.  . methotrexate (RHEUMATREX) 2.5 MG tablet Take 7.5 mg by mouth once a week. Caution:Chemotherapy. Protect from light.  Pt takes 3 tablets every Wednesday.  . prednisoLONE acetate (PRED FORTE) 1 % ophthalmic suspension Place 1 drop into the right eye 2 (two) times daily. Reported on 04/16/2015 (Patient taking differently: Place 1 drop into the right eye 4 (four) times daily. Reported on 04/16/2015)   No current facility-administered medications on file prior to visit.     No Known Allergies  Review Of Systems:  Constitutional:   No  weight loss, night sweats,  Fevers, chills, + fatigue, or  lassitude.  HEENT:   No headaches,  Difficulty swallowing,  Tooth/dental problems, or  Sore throat,                No sneezing, itching, ear ache, nasal congestion, post nasal drip,   CV:  No chest pain,  Orthopnea, PND, swelling in lower extremities, anasarca, dizziness, palpitations, syncope.   GI  No heartburn, indigestion, abdominal pain, nausea,  vomiting, diarrhea, change in bowel habits, loss of appetite, bloody stools.   Resp: + shortness of breath with exertion not  at rest.  No excess mucus, no productive cough,  + non-productive cough,  No coughing up of blood.  No change in color of mucus.  No wheezing.  No chest wall deformity  Skin: no rash or lesions.  GU: no dysuria, change in color of urine, no urgency or frequency.  No flank pain, no hematuria   MS:  No joint pain or swelling.  No decreased range of motion.  No back pain.  Psych:  No change in mood or affect. No depression or + anxiety.  No memory loss.   Vital Signs BP (!) 130/70 (BP Location: Right Arm, Cuff Size: Normal)   Pulse 76   Temp (!) 97.5 F (36.4 C) (Oral)   Ht 5\' 2"  (1.575 m)   Wt 129 lb 6.4 oz (58.7 kg)   SpO2 (S) (!) 85% Comment: 85% after walking from lobby, after 5 minutes, up t0 91%.  BMI 23.67 kg/m     Physical Exam:  General- No distress,  A&Ox3, pleasant ENT: No sinus tenderness, TM clear, pale nasal mucosa, no oral exudate,no post nasal drip, no LAN Cardiac: S1, S2, regular rate and rhythm, no murmur Chest: No wheeze/ rales/ dullness; no accessory muscle use, no nasal flaring, no sternal retractions, diminished breath sounds right side Abd.: Soft Non-tender, ND, BS +, Body mass index is 23.67 kg/m. Ext: No clubbing cyanosis, edema Neuro:  normal strength, MAE x 4, A&O x 3 Skin: No rashes, No lesions,  warm and dry Psych: normal mood and behavior   Assessment/Plan Hypoxemia sats of 85% on arrival to the Pulmonary office Recurrent Pneumothorax on the right  20-25% Plan EMS has been called for tele transport to the ED at [facility] ED Triage has been notified of need for [facility] insertion upon arrival ED is aware not to send patient to triage Continuous oxygen >> titrate for sats of > 92% Report given to EMT/ Paramedic Follow up with Dr. Carlis Abbott, Kipp Brood   ILD Sarcoidosis Plan Continue methotrexate 7.5 mg every Wednesday  This appointment was 60 min long with over 50% of the time in direct face-to-face patient care, assessment, plan of care, and follow-up.  Magdalen Spatz, NP 08/21/2019  5:50 pm

## 2019-08-21 NOTE — Progress Notes (Signed)
eLink Physician-Brief Progress Note Patient Name: Lauren Massey DOB: [birth year] MRN: [id number]   Date of Service  08/21/2019  HPI/Events of Note  Got a call from [facility] Radiologist reporting 25 % Pneumothorax, was not aware whether Pulmonary aware of this or not. Called [name] [phone]. She is in ED. Feeling fine.   eICU Interventions  Getting evaluated in [facility] ED for  for recurring rt pneumothorax.      Intervention Category Intermediate Interventions: Communication with other healthcare providers and/or family  Elmer Sow 08/21/2019, 7:39 PM

## 2019-08-21 NOTE — ED Triage Notes (Signed)
Arrived via EMS from [place] office. Patient have been having shortness of breath and dry and productive clear sputum cough. Had chest xray spontaneous pneumothorax right side diminished absent lung sounds. Patient states this is the 4th time this has happened.

## 2019-08-22 ENCOUNTER — Inpatient Hospital Stay (HOSPITAL_COMMUNITY): Payer: 59

## 2019-08-22 ENCOUNTER — Encounter: Payer: Self-pay | Admitting: Acute Care

## 2019-08-22 DIAGNOSIS — J9311 Primary spontaneous pneumothorax: Secondary | ICD-10-CM

## 2019-08-22 LAB — BASIC METABOLIC PANEL
Anion gap: 9 (ref 5–15)
BUN: 21 mg/dL (ref 8–23)
CO2: 26 mmol/L (ref 22–32)
Calcium: 9.2 mg/dL (ref 8.9–10.3)
Chloride: 102 mmol/L (ref 98–111)
Creatinine, Ser: 0.85 mg/dL (ref 0.44–1.00)
GFR calc Af Amer: >60 mL/min (ref >60)
GFR calc non Af Amer: >60 mL/min (ref >60)
Glucose, Bld: 99 mg/dL (ref 70–99)
Potassium: 3.7 mmol/L (ref 3.5–5.1)
Sodium: 137 mmol/L (ref 135–145)

## 2019-08-22 LAB — URINALYSIS, ROUTINE W REFLEX MICROSCOPIC
Bacteria, UA: NONE SEEN
Bilirubin Urine: NEGATIVE
Glucose, UA: NEGATIVE mg/dL
Ketones, ur: NEGATIVE mg/dL
Leukocytes,Ua: NEGATIVE
Nitrite: NEGATIVE
Protein, ur: NEGATIVE mg/dL
Specific Gravity, Urine: 1.023 (ref 1.005–1.030)
pH: 5 (ref 5.0–8.0)

## 2019-08-22 LAB — COMPREHENSIVE METABOLIC PANEL
ALT: 21 U/L (ref 0–44)
AST: 27 U/L (ref 15–41)
Albumin: 3.1 g/dL — ABNORMAL LOW (ref 3.5–5.0)
Alkaline Phosphatase: 63 U/L (ref 38–126)
Anion gap: 9 (ref 5–15)
BUN: 15 mg/dL (ref 8–23)
CO2: 25 mmol/L (ref 22–32)
Calcium: 9.5 mg/dL (ref 8.9–10.3)
Chloride: 101 mmol/L (ref 98–111)
Creatinine, Ser: 0.86 mg/dL (ref 0.44–1.00)
GFR calc Af Amer: >60 mL/min (ref >60)
GFR calc non Af Amer: >60 mL/min (ref >60)
Glucose, Bld: 108 mg/dL — ABNORMAL HIGH (ref 70–99)
Potassium: 3.9 mmol/L (ref 3.5–5.1)
Sodium: 135 mmol/L (ref 135–145)
Total Bilirubin: 0.4 mg/dL (ref 0.3–1.2)
Total Protein: 9.2 g/dL — ABNORMAL HIGH (ref 6.5–8.1)

## 2019-08-22 LAB — PROTIME-INR
INR: 1.1 (ref 0.8–1.2)
INR: 1.1 (ref 0.8–1.2)
Prothrombin Time: 13.7 seconds (ref 11.4–15.2)
Prothrombin Time: 13.4 seconds (ref 11.4–15.2)

## 2019-08-22 LAB — APTT: aPTT: 42 seconds — ABNORMAL HIGH (ref 24–36)

## 2019-08-22 LAB — BLOOD GAS, ARTERIAL
Acid-Base Excess: 3.6 mmol/L — ABNORMAL HIGH (ref 0.0–2.0)
Bicarbonate: 27.8 mmol/L (ref 20.0–28.0)
Drawn by: 418751
FIO2: 21
O2 Saturation: 87.5 %
Patient temperature: 37
pCO2 arterial: 44 mmHg (ref 32.0–48.0)
pH, Arterial: 7.418 (ref 7.350–7.450)
pO2, Arterial: 51.6 mmHg — ABNORMAL LOW (ref 83.0–108.0)

## 2019-08-22 LAB — CBC
HCT: 35.7 % — ABNORMAL LOW (ref 36.0–46.0)
HCT: 37.8 % (ref 36.0–46.0)
Hemoglobin: 11.1 g/dL — ABNORMAL LOW (ref 12.0–15.0)
Hemoglobin: 11.8 g/dL — ABNORMAL LOW (ref 12.0–15.0)
MCH: 28 pg (ref 26.0–34.0)
MCH: 28 pg (ref 26.0–34.0)
MCHC: 31.1 g/dL (ref 30.0–36.0)
MCHC: 31.2 g/dL (ref 30.0–36.0)
MCV: 89.9 fL (ref 80.0–100.0)
MCV: 89.8 fL (ref 80.0–100.0)
Platelets: 156 10*3/uL (ref 150–400)
Platelets: 183 10*3/uL (ref 150–400)
RBC: 3.97 MIL/uL (ref 3.87–5.11)
RBC: 4.21 MIL/uL (ref 3.87–5.11)
RDW: 13.7 % (ref 11.5–15.5)
RDW: 13.7 % (ref 11.5–15.5)
WBC: 4 10*3/uL (ref 4.0–10.5)
WBC: 4.4 10*3/uL (ref 4.0–10.5)
nRBC: 0 % (ref 0.0–0.2)
nRBC: 0 % (ref 0.0–0.2)

## 2019-08-22 LAB — TYPE AND SCREEN
ABO/RH(D): A POS
Antibody Screen: NEGATIVE

## 2019-08-22 LAB — TROPONIN I (HIGH SENSITIVITY): Troponin I (High Sensitivity): 4 ng/L (ref <18)

## 2019-08-22 MED ORDER — CEFAZOLIN SODIUM-DEXTROSE 2-4 GM/100ML-% IV SOLN
2.0000 g | INTRAVENOUS | Status: AC
  Administered 2019-08-23: 2 g via INTRAVENOUS
  Filled 2019-08-22: qty 100

## 2019-08-22 NOTE — Progress Notes (Signed)
Pt taken off O2 before ABG, O2 turned back on after RT obtained ABG

## 2019-08-22 NOTE — Progress Notes (Signed)
[address]       [place] [address]             [phone]                   Procedure(s) (LRB): VIDEO ASSISTED THORACOSCOPY (Right) MECHANICAL PLEURODESIS (Right) possible TALC PLEURADESIS (Right)  LOS: 1 day   Subjective: Patient awake alert, on oxygen but in no respiratory distress  Objective: Vital signs in last 24 hours: Patient Vitals for the past 24 hrs:  BP Temp Temp src Pulse Resp SpO2 Height Weight  08/22/19 1238 - - - 56 22 100 % - -  08/22/19 1237 - - - 56 20 100 % - -  08/22/19 0725 126/72 - - 56 17 100 % - -  08/22/19 0406 123/80 98.2 F (36.8 C) Oral 61 17 100 % - -  08/22/19 0000 - - - - - - 5\' 2"  (1.575 m) 58.7 kg  08/21/19 2348 (!) 129/81 98.4 F (36.9 C) Oral 71 20 100 % - 58.7 kg  08/21/19 2045 127/82 - - 65 19 100 % - -  08/21/19 1830 127/85 - - 73 20 100 % - -  08/21/19 1826 - - - - - - 5\' 2"  (1.575 m) 58.5 kg  08/21/19 1825 (!) 145/89 98.6 F (37 C) - 72 20 100 % - -  08/21/19 1814 - - - - - 100 % - -    Filed Weights   08/21/19 1826 08/21/19 2348 08/22/19 0000  Weight: 58.5 kg 58.7 kg 58.7 kg    Hemodynamic parameters for last 24 hours:    Intake/Output from previous day: 07/28 0701 - 07/29 0700 In: -  Out: 111 [Stool:111] Intake/Output this shift: Total I/O In: -  Out: 551 [Urine:550; Stool:1]  Scheduled Meds: . folic acid  5 mg Oral QHS  . hydrochlorothiazide  12.5 mg Oral Daily  . methotrexate  7.5 mg Oral Weekly  . prednisoLONE acetate  1 drop Right Eye QID   Continuous Infusions: PRN Meds:.albuterol, ondansetron **OR** ondansetron (ZOFRAN) IV  General appearance: alert, cooperative, appears stated age and no distress Neurologic: intact Heart: regular rate and rhythm, S1, S2 normal, no murmur, click, rub or gallop Lungs: diminished breath sounds RLL and RML Abdomen: soft, non-tender; bowel sounds normal; no masses,  no organomegaly Extremities: extremities normal, atraumatic, no cyanosis or edema  and Homans sign is negative, no sign of DVT  Lab Results: CBC: Recent Labs    08/21/19 1844 08/22/19 0055  WBC 3.9* 4.0  HGB 11.7* 11.1*  HCT 38.1 35.7*  PLT 183 156   BMET:  Recent Labs    08/21/19 1844 08/22/19 0055  NA 137 137  K 3.6 3.7  CL 102 102  CO2 27 26  GLUCOSE 95 99  BUN 21 21  CREATININE 0.89 0.85  CALCIUM 9.4 9.2    PT/INR:  Recent Labs    08/22/19 0520  LABPROT 13.7  INR 1.1     Radiology DG Chest 2 View  Result Date: 08/21/2019 CLINICAL DATA:  Cough and shortness of breath. Interstitial lung disease. EXAM: CHEST - 2 VIEW COMPARISON:  08/07/2019 FINDINGS: Heart size is normal. Advanced chronic interstitial lung disease with pulmonary scarring. Acute pneumothorax on the right estimated at 20-25%. No evidence of tension. Lung markings are in general slightly more prominent, which could go along with some acute inflammation. No dense consolidation or lobar collapse. IMPRESSION: Chronic lung disease. Pneumothorax  on the right estimated at 20-25%. Increase pulmonary markings could possibly indicate some acute inflammation superimposed upon the chronic lung disease. Call report in progress. Electronically Signed   By: Nelson Chimes M.D.   On: 08/21/2019 17:29   CT CHEST WO CONTRAST  Result Date: 08/22/2019 CLINICAL DATA:  Acute respiratory failure with hypoxia. Known pneumothorax. EXAM: CT CHEST WITHOUT CONTRAST TECHNIQUE: Multidetector CT imaging of the chest was performed following the standard protocol without IV contrast. COMPARISON:  The graft performed 08/21/2019 and CT chest dated 03/07/2019. FINDINGS: Cardiovascular: The heart remains enlarged. No pericardial effusion. The pulmonary artery is enlarged, measuring 3.7 cm in diameter, unchanged since prior exam. Mitral valve annulus calcifications are redemonstrated. Vascular calcifications are seen in the coronary arteries and aortic arch. Mediastinum/Nodes: Calcified mediastinal, hilar, and retrocrural lymph  nodes are redemonstrated and consistent with granulomatous disease. Thyroid gland, trachea, and esophagus demonstrate no significant findings. Lungs/Pleura: There is a moderate sized right pneumothorax. There is no left pneumothorax. There is no pleural effusion on either side. Severe underlying chronic lung disease related to sarcoidosis with bronchiectasis and architectural distortion is not significantly changed. Pulmonary cystic changes are greatest at the lung bases. Few scattered calcified granuloma are redemonstrated. Upper Abdomen: A gallstone is partially imaged. Musculoskeletal: Areas of a relative sclerosis are seen throughout the spine likely reflect skeletal manifestations of sarcoidosis and are not significantly changed since 03/07/2019. IMPRESSION: 1. Moderate sized right pneumothorax. 2. Severe underlying chronic lung disease related to sarcoidosis. 3. Enlarged pulmonary artery, consistent with pulmonary hypertension. Aortic Atherosclerosis (ICD10-I70.0). Electronically Signed   By: Zerita Boers M.D.   On: 08/22/2019 14:57   Chest x-ray and CT are reviewed  Assessment/Plan: S/P Procedure(s) (LRB): VIDEO ASSISTED THORACOSCOPY (Right) MECHANICAL PLEURODESIS (Right) possible TALC PLEURADESIS (Right) With the patient's previous history of recurrent pneumothoraces, she has a partial response to the previous pleurectomy robotic approach and mechanical pleurodesis, with good adhesion at the apex and upper part of the chest but now has had 2 recurrent pneumothoraces with a stripe along the lateral wall.  Installation of doxycycline did not improve the situation several weeks ago.  I recommended to the patient after reviewing the CT scan that we proceed with video-assisted thoracoscopy on the left perform a mechanical pleurodesis along the lateral wall and likely use talc powder to further the synthesis of the pleural surfaces to prevent recurrent pneumothoraces.  The risks and options have been  discussed with the patient on several occasions and again this evening-she asked appropriate questions and was willing to proceed.  We will plan surgery for tomorrow.  N.p.o. after midnight tonight The goals risks and alternatives of the planned surgical procedure Procedure(s): VIDEO ASSISTED THORACOSCOPY (Right) MECHANICAL PLEURODESIS (Right) possible TALC PLEURADESIS (Right)  have been discussed with the patient in detail. The risks of the procedure including death, infection, stroke, myocardial infarction, bleeding, blood transfusion, recurrent ptx  have all been discussed specifically.  I have quoted Lauren Massey a 1 % of perioperative mortality and a complication rate as high as 20 %. The patient's questions have been answered.Billiejo C Rego is willing  to proceed with the planned procedure.   Grace Isaac MD 08/22/2019 5:24 PM     Patient ID: Lauren Massey, female   DOB: 1955-08-15, 64 y.o.   MRN: [id number]

## 2019-08-22 NOTE — Progress Notes (Signed)
PROGRESS NOTE  Lauren Massey  DOB: 07-15-1955  PCP: Hoyt Koch, MD YSA:[id number]  DOA: 08/21/2019  LOS: 1 day   Chief Complaint  Patient presents with  . Shortness of Breath   Brief narrative: Lauren Massey is a 64 y.o. female with history of sarcoidosis who has had recurrent pneumothorax and underwent chemical pleurodesis last month. She followed up with pulmonologist on 7/28. She complained of shortness of breath, worsening cough, was found to be hypoxic.  Chest x-ray was obtained which showed right-sided pneumothorax and hence referred to the ER.   In the ED, patient required 2 L oxygen to maintain saturation. Chest x-ray confirmed right-sided 20 to 25% pneumothorax.   ER physician discussed with on-call cardiothoracic surgeon Dr. Kipp Brood. Labs showed hemoglobin of 11.7 WBC 3.9. Covid test negative  EKG showed normal sinus rhythm. Patient was admitted to hospitalist service.   Subjective: Patient was seen and examined this morning. Lying down in bed.  Not in distress.  On supplemental oxygen. Chart reviewed. No fever, remains hemodynamically stable.  On 3 L oxygen by nasal collar. Labs unremarkable   Assessment/Plan: Acute respiratory failure with hypoxia Recurrent pneumothorax  -Presented with worsening shortness of breath, cough, hypoxia.   -Chest x-ray showed right-sided 2025% pneumothorax.   -Patient had chemical pleurodesis of the right side last month only. -Cardiothoracic surgeon consulted.  Noted a plan to obtain CT chest today to determine mechanical pleurodesis versus chest tube placement. -Patient remains n.p.o. at this time.  History of sarcoidosis  -continue methotrexate 7.5 mg weekly.  Hypertension  -Continue to monitor blood pressure on hydrochlorothiazide.  Mobility: Encourage ambulation Code Status:   Code Status: Full Code  Nutritional status: Body mass index is 23.67 kg/m.     Diet Order            Diet NPO time  specified Except for: Sips with Meds  Diet effective midnight                 DVT prophylaxis: SCDs Start: 08/21/19 2142   Antimicrobials:  None Fluid: None  Consultants: Cardiothoracic surgery Family Communication:  Not at bedside  Status is: Inpatient  Remains inpatient appropriate because:Ongoing diagnostic testing needed not appropriate for outpatient work up   Dispo: The patient is from: Home              Anticipated d/c is to: Home              Anticipated d/c date is: > 3 days              Patient currently is not medically stable to d/c.       Infusions:    Scheduled Meds: . folic acid  5 mg Oral QHS  . hydrochlorothiazide  12.5 mg Oral Daily  . methotrexate  7.5 mg Oral Weekly  . prednisoLONE acetate  1 drop Right Eye QID    Antimicrobials: Anti-infectives (From admission, onward)   None      PRN meds: albuterol, ondansetron **OR** ondansetron (ZOFRAN) IV   Objective: Vitals:   08/22/19 0406 08/22/19 0725  BP: 123/80 126/72  Pulse: 61 56  Resp: 17 17  Temp: 98.2 F (36.8 C)   SpO2: 100% 100%   No intake or output data in the 24 hours ending 08/22/19 0939 Filed Weights   08/21/19 1826 08/21/19 2348 08/22/19 0000  Weight: 58.5 kg 58.7 kg 58.7 kg   Weight change:  Body mass index is 23.67 kg/m.  Physical Exam: General exam: Appears calm and comfortable.  Not in physical distress Skin: No rashes, lesions or ulcers. HEENT: Atraumatic, normocephalic, supple neck, no obvious bleeding Lungs: Diminished air entry on the right, otherwise clear to auscultation bilaterally CVS: Regular rate and rhythm, no murmur GI/Abd soft, nontender, nondistended, bowel sound present CNS: Alert, awake, oriented x3 Psychiatry: Mood appropriate Extremities: No pedal edema, no calf tenderness  Data Review: I have personally reviewed the laboratory data and studies available.  Recent Labs  Lab 08/21/19 1844 08/22/19 0055  WBC 3.9* 4.0  NEUTROABS 1.8   --   HGB 11.7* 11.1*  HCT 38.1 35.7*  MCV 90.9 89.9  PLT 183 156   Recent Labs  Lab 08/21/19 1844 08/22/19 0055  NA 137 137  K 3.6 3.7  CL 102 102  CO2 27 26  GLUCOSE 95 99  BUN 21 21  CREATININE 0.89 0.85  CALCIUM 9.4 9.2   No results found for: HGBA1C     Component Value Date/Time   CHOL 233 (A) 11/17/2018 0000   TRIG 106 11/17/2018 0000   HDL 77 (A) 11/17/2018 0000   CHOLHDL 4 02/05/2018 1010   VLDL 18.2 02/05/2018 1010   LDLCALC 18 11/17/2018 0000    Signed, Terrilee Croak, MD [facility] Pager: [phone] (Secure Chat preferred). 08/22/2019

## 2019-08-22 NOTE — Progress Notes (Signed)
These results were given to the patient at the time of the OV.

## 2019-08-22 NOTE — Consult Note (Signed)
[address]       [place] [address]             [phone]                    Pura C Thomason Harper Medical Record #[id number] Date of Birth: 1955/10/16  Referring: No ref. provider found Primary Care: Hoyt Koch, MD Primary Cardiologist: Donato Heinz, MD  Chief Complaint:    Chief Complaint  Patient presents with  . Shortness of Breath    History of Present Illness:    Lauren Massey 64 y.o. female well-known to our service for management of a spontaneous pneumothorax. She originally underwent a robotic assisted wedge resection and apical pleurectomy in February of this year, but has represented on 3 other occasions with a recurrent spontaneous pneumothorax. This currently is her fourth spontaneous pneumothorax on the right side. She is also undergone pigtail placement with chemical pleurodesis following her surgery. She has a several day history of worsening shortness of breath and increased cough was noted to be hypoxic. She is on supplemental O2 at night at home. In regards to her pulmonary history she does have a diagnosis of sarcoidosis with severe pulmonary disease.       Zubrod Score: At the time of surgery this patient's most appropriate activity status/level should be described as: []     0    Normal activity, no symptoms [x]     1    Restricted in physical strenuous activity but ambulatory, able to do out light work []     2    Ambulatory and capable of self care, unable to do work activities, up and about               >50 % of waking hours                              []     3    Only limited self care, in bed greater than 50% of waking hours []     4    Completely disabled, no self care, confined to bed or chair []     5    Moribund   Past Medical History:  Diagnosis Date  . Cataract    right eye  . Pneumothorax   . Sarcoidosis    skin, eye, lung  . Unspecified iridocyclitis     Past Surgical History:    Procedure Laterality Date  . ABDOMINAL HYSTERECTOMY    . APPENDECTOMY    . BRONCHOSCOPY  2003  . CATARACT EXTRACTION    . INTERCOSTAL NERVE BLOCK Right 03/12/2019   Procedure: Intercostal Nerve Block;  Surgeon: Grace Isaac, MD;  Location: [facility];  Service: Thoracic;  Laterality: Right;  Marland Kitchen VESICOVAGINAL FISTULA CLOSURE W/ TAH    . VIDEO BRONCHOSCOPY N/A 03/12/2019   Procedure: Video Bronchoscopy;  Surgeon: Grace Isaac, MD;  [facility];  Service: Thoracic;  Laterality: N/A;    Family History  Problem Relation Age of Onset  . Alzheimer's disease Mother   . Cancer Father   . Heart disease Father   . Diabetes Other   . Colon cancer Neg Hx   . Esophageal cancer Neg Hx   . Pancreatic cancer Neg Hx   . Stomach cancer Neg Hx      Social History   Tobacco Use  Smoking Status Never Smoker  Smokeless Tobacco Never Used    Social History   Substance and Sexual Activity  Alcohol Use No     No Known Allergies  Current Facility-Administered Medications  Medication Dose Route Frequency Provider Last Rate Last Admin  . albuterol (PROVENTIL) (2.5 MG/3ML) 0.083% nebulizer solution 2.5 mg  2.5 mg Inhalation Q6H PRN Rise Patience, MD      . folic acid (FOLVITE) tablet 5 mg  5 mg Oral QHS Rise Patience, MD   5 mg at 08/21/19 2245  . hydrochlorothiazide (MICROZIDE) capsule 12.5 mg  12.5 mg Oral Daily Rise Patience, MD   12.5 mg at 08/21/19 2245  . methotrexate (RHEUMATREX) tablet 7.5 mg  7.5 mg Oral Weekly Rise Patience, MD   7.5 mg at 08/21/19 2245  . ondansetron (ZOFRAN) tablet 4 mg  4 mg Oral Q6H PRN Rise Patience, MD       Or  . ondansetron [facility]) injection 4 mg  4 mg Intravenous Q6H PRN Rise Patience, MD      . prednisoLONE acetate (PRED FORTE) 1 % ophthalmic suspension 1 drop  1 drop Right Eye QID Rise Patience, MD        Review of Systems  Constitutional: Positive for malaise/fatigue.  Respiratory: Positive for  cough and shortness of breath.   Cardiovascular: Positive for chest pain.     PHYSICAL EXAMINATION: BP 126/72 (BP Location: Right Arm)   Pulse 56   Temp 98.2 F (36.8 C) (Oral)   Resp 17   Ht 5\' 2"  (1.575 m)   Wt 58.7 kg   SpO2 100%   BMI 23.67 kg/m  Physical Exam Constitutional:      General: She is not in acute distress.    Appearance: She is well-developed. She is not ill-appearing.  HENT:     Head: Normocephalic and atraumatic.  Cardiovascular:     Rate and Rhythm: Normal rate and regular rhythm.  Pulmonary:     Effort: Pulmonary effort is normal. No tachypnea.  Neurological:     General: No focal deficit present.     Mental Status: She is alert and oriented to person, place, and time.     Diagnostic Studies & Laboratory data:     Recent Radiology Findings:   DG Chest 2 View  Result Date: 08/21/2019 CLINICAL DATA:  Cough and shortness of breath. Interstitial lung disease. EXAM: CHEST - 2 VIEW COMPARISON:  08/07/2019 FINDINGS: Heart size is normal. Advanced chronic interstitial lung disease with pulmonary scarring. Acute pneumothorax on the right estimated at 20-25%. No evidence of tension. Lung markings are in general slightly more prominent, which could go along with some acute inflammation. No dense consolidation or lobar collapse. IMPRESSION: Chronic lung disease. Pneumothorax on the right estimated at 20-25%. Increase pulmonary markings could possibly indicate some acute inflammation superimposed upon the chronic lung disease. Call report in progress. Electronically Signed   By: Nelson Chimes M.D.   On: 08/21/2019 17:29   DG Chest 2 View  Result Date: 08/07/2019 CLINICAL DATA:  64 year old female with history of shortness of breath. History of right-sided pneumothorax on 07/16/2019. EXAM: CHEST - 2 VIEW COMPARISON:  Chest x-ray 07/23/2019. FINDINGS: Lung volumes are low and there are diffuse reticulonodular opacities throughout the lungs bilaterally, similar to prior  examinations, compatible with underlying pulmonary fibrosis and widespread bronchiectasis. No pneumothorax. No acute consolidative airspace disease. No pleural effusions. No evidence of pulmonary edema. Heart size is normal. Upper mediastinal contours are within normal  limits. Aortic atherosclerosis. IMPRESSION: 1. No evidence of recurrent pneumothorax. 2. Advanced fibrotic changes in the lungs redemonstrated, as above. 3. Aortic atherosclerosis. Electronically Signed   By: Vinnie Langton M.D.   On: 08/07/2019 17:09       I have independently reviewed the above radiology studies  and reviewed the findings with the patient.   Recent Lab Findings: Lab Results  Component Value Date   WBC 4.0 08/22/2019   HGB 11.1 (L) 08/22/2019   HCT 35.7 (L) 08/22/2019   PLT 156 08/22/2019   GLUCOSE 99 08/22/2019   CHOL 233 (A) 11/17/2018   TRIG 106 11/17/2018   HDL 77 (A) 11/17/2018   LDLCALC 18 11/17/2018   ALT 20 08/21/2019   AST 26 08/21/2019   NA 137 08/22/2019   K 3.7 08/22/2019   CL 102 08/22/2019   CREATININE 0.85 08/22/2019   BUN 21 08/22/2019   CO2 26 08/22/2019   TSH 1.87 08/03/2015   INR 1.1 08/22/2019       Assessment / Plan:   64 year old female with history of sarcoidosis with pulmonary component, and recurrent spontaneous pneumothoraces. Her chest x-ray does reveal a moderate sized pneumothorax on the right. There does appear to be a component that is stuck to the chest wall, but we will require CT chest for further evaluation. Based on the location, I am not sure that she will be a good surgical candidate for another pleurectomy, but we will consider an aggressive mechanical pleurodesis in combination with a talc pleurodesis. Other options include an image guided chest tube placement. I we will know for certain after the CT scan is performed.       Lajuana Matte 08/22/2019 8:37 AM

## 2019-08-23 ENCOUNTER — Inpatient Hospital Stay (HOSPITAL_COMMUNITY): Payer: 59 | Admitting: Anesthesiology

## 2019-08-23 ENCOUNTER — Encounter (HOSPITAL_COMMUNITY)
Admission: EM | Disposition: A | Discharge status: Home / Self Care | Payer: Self-pay | Source: Home / Self Care | Attending: Internal Medicine

## 2019-08-23 ENCOUNTER — Encounter (HOSPITAL_COMMUNITY): Payer: Self-pay | Admitting: Internal Medicine

## 2019-08-23 ENCOUNTER — Inpatient Hospital Stay (HOSPITAL_COMMUNITY): Payer: 59

## 2019-08-23 HISTORY — PX: TALC PLEURODESIS: SHX2506

## 2019-08-23 HISTORY — PX: VIDEO ASSISTED THORACOSCOPY: SHX5073

## 2019-08-23 HISTORY — PX: PLEURADESIS: SHX6030

## 2019-08-23 LAB — GLUCOSE, CAPILLARY: Glucose-Capillary: 206 mg/dL — ABNORMAL HIGH (ref 70–99)

## 2019-08-23 SURGERY — VIDEO ASSISTED THORACOSCOPY
Anesthesia: General | Site: Chest | Laterality: Right

## 2019-08-23 MED ORDER — BISACODYL 5 MG PO TBEC
10.0000 mg | DELAYED_RELEASE_TABLET | Freq: Every day | ORAL | Status: DC
  Administered 2019-08-24 – 2019-08-28 (×4): 10 mg via ORAL
  Filled 2019-08-23 (×5): qty 2

## 2019-08-23 MED ORDER — ROCURONIUM BROMIDE 10 MG/ML (PF) SYRINGE
PREFILLED_SYRINGE | INTRAVENOUS | Status: DC | PRN
  Administered 2019-08-23: 80 mg via INTRAVENOUS
  Administered 2019-08-23: 20 mg via INTRAVENOUS

## 2019-08-23 MED ORDER — PHENYLEPHRINE HCL-NACL 10-0.9 MG/250ML-% IV SOLN
INTRAVENOUS | Status: DC | PRN
  Administered 2019-08-23: 10 ug/min via INTRAVENOUS

## 2019-08-23 MED ORDER — BUPIVACAINE HCL (PF) 0.5 % IJ SOLN
INTRAMUSCULAR | Status: AC
  Filled 2019-08-23: qty 30

## 2019-08-23 MED ORDER — LIDOCAINE 2% (20 MG/ML) 5 ML SYRINGE
INTRAMUSCULAR | Status: AC
  Filled 2019-08-23: qty 5

## 2019-08-23 MED ORDER — OXYCODONE HCL 5 MG PO TABS
5.0000 mg | ORAL_TABLET | ORAL | Status: DC | PRN
  Administered 2019-08-25 – 2019-08-27 (×3): 5 mg via ORAL
  Filled 2019-08-23 (×4): qty 1

## 2019-08-23 MED ORDER — NALOXONE HCL 0.4 MG/ML IJ SOLN: 0.4000 mg | INTRAMUSCULAR | Status: DC | PRN

## 2019-08-23 MED ORDER — LACTATED RINGERS IV SOLN: INTRAVENOUS | Status: DC | PRN

## 2019-08-23 MED ORDER — ORAL CARE MOUTH RINSE: 15.0000 mL | Freq: Once | OROMUCOSAL | Status: AC

## 2019-08-23 MED ORDER — TALC (STERITALC) POWDER FOR INTRAPLEURAL USE
INTRAPLEURAL | Status: AC
  Filled 2019-08-23: qty 4

## 2019-08-23 MED ORDER — DIPHENHYDRAMINE HCL 12.5 MG/5ML PO ELIX
12.5000 mg | ORAL_SOLUTION | Freq: Four times a day (QID) | ORAL | Status: DC | PRN
  Administered 2019-08-23: 12.5 mg via ORAL
  Filled 2019-08-23: qty 5

## 2019-08-23 MED ORDER — DIPHENHYDRAMINE HCL 50 MG/ML IJ SOLN: 12.5000 mg | Freq: Four times a day (QID) | INTRAMUSCULAR | Status: DC | PRN

## 2019-08-23 MED ORDER — FENTANYL CITRATE (PF) 100 MCG/2ML IJ SOLN
25.0000 ug | INTRAMUSCULAR | Status: DC | PRN
  Administered 2019-08-23 (×4): 25 ug via INTRAVENOUS

## 2019-08-23 MED ORDER — CEFAZOLIN SODIUM-DEXTROSE 2-4 GM/100ML-% IV SOLN
2.0000 g | Freq: Three times a day (TID) | INTRAVENOUS | Status: AC
  Administered 2019-08-23 – 2019-08-24 (×2): 2 g via INTRAVENOUS
  Filled 2019-08-23 (×2): qty 100

## 2019-08-23 MED ORDER — CHLORHEXIDINE GLUCONATE 0.12 % MT SOLN
OROMUCOSAL | Status: AC
  Administered 2019-08-23: 15 mL via OROMUCOSAL
  Filled 2019-08-23: qty 15

## 2019-08-23 MED ORDER — SENNOSIDES-DOCUSATE SODIUM 8.6-50 MG PO TABS
1.0000 | ORAL_TABLET | Freq: Every day | ORAL | Status: DC
  Administered 2019-08-23 – 2019-08-27 (×4): 1 via ORAL
  Filled 2019-08-23 (×5): qty 1

## 2019-08-23 MED ORDER — SUGAMMADEX SODIUM 200 MG/2ML IV SOLN
INTRAVENOUS | Status: DC | PRN
  Administered 2019-08-23: 117.4 mg via INTRAVENOUS

## 2019-08-23 MED ORDER — TALC 5 G PL SUSR
INTRAPLEURAL | Status: DC | PRN
  Administered 2019-08-23: 4 g via INTRAPLEURAL

## 2019-08-23 MED ORDER — FENTANYL CITRATE (PF) 250 MCG/5ML IJ SOLN
INTRAMUSCULAR | Status: AC
  Filled 2019-08-23: qty 5

## 2019-08-23 MED ORDER — POTASSIUM CHLORIDE IN NACL 20-0.9 MEQ/L-% IV SOLN
INTRAVENOUS | Status: DC
  Filled 2019-08-23 (×4): qty 1000

## 2019-08-23 MED ORDER — ONDANSETRON HCL 4 MG/2ML IJ SOLN
INTRAMUSCULAR | Status: AC
  Filled 2019-08-23: qty 2

## 2019-08-23 MED ORDER — BUPIVACAINE LIPOSOME 1.3 % IJ SUSP
20.0000 mL | Freq: Once | INTRAMUSCULAR | Status: DC
  Filled 2019-08-23: qty 20

## 2019-08-23 MED ORDER — ACETAMINOPHEN 500 MG PO TABS
1000.0000 mg | ORAL_TABLET | Freq: Four times a day (QID) | ORAL | Status: DC
  Administered 2019-08-23 – 2019-08-28 (×16): 1000 mg via ORAL
  Filled 2019-08-23 (×15): qty 2

## 2019-08-23 MED ORDER — ROCURONIUM BROMIDE 10 MG/ML (PF) SYRINGE
PREFILLED_SYRINGE | INTRAVENOUS | Status: AC
  Filled 2019-08-23: qty 10

## 2019-08-23 MED ORDER — SODIUM CHLORIDE FLUSH 0.9 % IV SOLN
INTRAVENOUS | Status: DC | PRN
  Administered 2019-08-23: 30 mL

## 2019-08-23 MED ORDER — CHLORHEXIDINE GLUCONATE 0.12 % MT SOLN: 15.0000 mL | Freq: Once | OROMUCOSAL | Status: AC

## 2019-08-23 MED ORDER — MIDAZOLAM HCL 2 MG/2ML IJ SOLN
INTRAMUSCULAR | Status: AC
  Filled 2019-08-23: qty 2

## 2019-08-23 MED ORDER — 0.9 % SODIUM CHLORIDE (POUR BTL) OPTIME
TOPICAL | Status: DC | PRN
  Administered 2019-08-23: 2000 mL

## 2019-08-23 MED ORDER — LIDOCAINE 2% (20 MG/ML) 5 ML SYRINGE
INTRAMUSCULAR | Status: DC | PRN
  Administered 2019-08-23: 100 mg via INTRAVENOUS

## 2019-08-23 MED ORDER — ENOXAPARIN SODIUM 40 MG/0.4ML ~~LOC~~ SOLN
40.0000 mg | SUBCUTANEOUS | Status: DC
  Administered 2019-08-23 – 2019-08-26 (×4): 40 mg via SUBCUTANEOUS
  Filled 2019-08-23 (×4): qty 0.4

## 2019-08-23 MED ORDER — DEXAMETHASONE SODIUM PHOSPHATE 10 MG/ML IJ SOLN
INTRAMUSCULAR | Status: AC
  Filled 2019-08-23: qty 1

## 2019-08-23 MED ORDER — FENTANYL CITRATE (PF) 100 MCG/2ML IJ SOLN
INTRAMUSCULAR | Status: DC | PRN
  Administered 2019-08-23 (×2): 50 ug via INTRAVENOUS
  Administered 2019-08-23: 100 ug via INTRAVENOUS
  Administered 2019-08-23: 50 ug via INTRAVENOUS

## 2019-08-23 MED ORDER — PROPOFOL 10 MG/ML IV BOLUS
INTRAVENOUS | Status: DC | PRN
  Administered 2019-08-23: 160 mg via INTRAVENOUS

## 2019-08-23 MED ORDER — CHLORHEXIDINE GLUCONATE CLOTH 2 % EX PADS
6.0000 | MEDICATED_PAD | Freq: Every day | CUTANEOUS | Status: DC
  Administered 2019-08-23 – 2019-08-24 (×2): 6 via TOPICAL

## 2019-08-23 MED ORDER — SODIUM CHLORIDE 0.9% FLUSH: 9.0000 mL | INTRAVENOUS | Status: DC | PRN

## 2019-08-23 MED ORDER — FENTANYL 50 MCG/ML IV PCA SOLN
INTRAVENOUS | Status: AC
  Filled 2019-08-23: qty 20

## 2019-08-23 MED ORDER — HYDROCHLOROTHIAZIDE 12.5 MG PO CAPS
12.5000 mg | ORAL_CAPSULE | Freq: Every day | ORAL | Status: DC
  Administered 2019-08-24 – 2019-08-28 (×5): 12.5 mg via ORAL
  Filled 2019-08-23 (×5): qty 1

## 2019-08-23 MED ORDER — PROPOFOL 10 MG/ML IV BOLUS
INTRAVENOUS | Status: AC
  Filled 2019-08-23: qty 20

## 2019-08-23 MED ORDER — ACETAMINOPHEN 160 MG/5ML PO SOLN: 1000.0000 mg | Freq: Four times a day (QID) | ORAL | Status: DC

## 2019-08-23 MED ORDER — ONDANSETRON HCL 4 MG/2ML IJ SOLN
INTRAMUSCULAR | Status: DC | PRN
  Administered 2019-08-23: 4 mg via INTRAVENOUS

## 2019-08-23 MED ORDER — MIDAZOLAM HCL 5 MG/5ML IJ SOLN
INTRAMUSCULAR | Status: DC | PRN
  Administered 2019-08-23: 1 mg via INTRAVENOUS

## 2019-08-23 MED ORDER — SODIUM CHLORIDE 0.9 % IV SOLN: INTRAVENOUS | Status: DC | PRN

## 2019-08-23 MED ORDER — PROMETHAZINE HCL 25 MG/ML IJ SOLN: 6.2500 mg | INTRAMUSCULAR | Status: DC | PRN

## 2019-08-23 MED ORDER — PHENYLEPHRINE 40 MCG/ML (10ML) SYRINGE FOR IV PUSH (FOR BLOOD PRESSURE SUPPORT)
PREFILLED_SYRINGE | INTRAVENOUS | Status: DC | PRN
  Administered 2019-08-23: 80 ug via INTRAVENOUS

## 2019-08-23 MED ORDER — DEXAMETHASONE SODIUM PHOSPHATE 10 MG/ML IJ SOLN
INTRAMUSCULAR | Status: DC | PRN
  Administered 2019-08-23: 4 mg via INTRAVENOUS

## 2019-08-23 MED ORDER — TRAMADOL HCL 50 MG PO TABS: 50.0000 mg | ORAL_TABLET | Freq: Four times a day (QID) | ORAL | Status: DC | PRN

## 2019-08-23 MED ORDER — FENTANYL 50 MCG/ML IV PCA SOLN
INTRAVENOUS | Status: DC
  Administered 2019-08-23: 10 ug via INTRAVENOUS
  Administered 2019-08-23: 30 ug via INTRAVENOUS
  Administered 2019-08-23: 70 ug via INTRAVENOUS
  Administered 2019-08-24: 0 ug via INTRAVENOUS
  Administered 2019-08-24: 60 ug via INTRAVENOUS
  Administered 2019-08-24 (×2): 0 ug via INTRAVENOUS
  Administered 2019-08-24: 40 ug via INTRAVENOUS
  Administered 2019-08-25 (×2): 0 ug via INTRAVENOUS

## 2019-08-23 MED ORDER — FENTANYL CITRATE (PF) 100 MCG/2ML IJ SOLN
INTRAMUSCULAR | Status: AC
  Filled 2019-08-23: qty 2

## 2019-08-23 MED ORDER — LACTATED RINGERS IV SOLN: INTRAVENOUS | Status: DC

## 2019-08-23 MED ORDER — ALBUMIN HUMAN 5 % IV SOLN
INTRAVENOUS | Status: DC | PRN
Start: 2019-08-23 — End: 2019-08-23

## 2019-08-23 SURGICAL SUPPLY — 88 items
ADH SKN CLS APL DERMABOND .7 (GAUZE/BANDAGES/DRESSINGS) ×2
ANCHOR CATH FOLEY SECURE (MISCELLANEOUS) IMPLANT
APL SRG 22X2 LUM MLBL SLNT (VASCULAR PRODUCTS)
APL SRG 7X2 LUM MLBL SLNT (VASCULAR PRODUCTS)
APPLICATOR TIP COSEAL (VASCULAR PRODUCTS) IMPLANT
APPLICATOR TIP EXT COSEAL (VASCULAR PRODUCTS) IMPLANT
BLADE CLIPPER SURG (BLADE) IMPLANT
CANISTER SUCT 3000ML PPV (MISCELLANEOUS) ×3 IMPLANT
CATH ROBINSON RED A/P 18FR (CATHETERS) ×3 IMPLANT
CATH THORACIC 28FR (CATHETERS) IMPLANT
CATH THORACIC 36FR (CATHETERS) IMPLANT
CATH THORACIC 36FR RT ANG (CATHETERS) IMPLANT
CLEANER TIP ELECTROSURG 2X2 (MISCELLANEOUS) ×3 IMPLANT
CLIP VESOCCLUDE MED 6/CT (CLIP) IMPLANT
CNTNR URN SCR LID CUP LEK RST (MISCELLANEOUS) ×6 IMPLANT
CONN ST 1/4X3/8  BEN (MISCELLANEOUS) ×3
CONN ST 1/4X3/8 BEN (MISCELLANEOUS) ×2 IMPLANT
CONT SPEC 4OZ STRL OR WHT (MISCELLANEOUS) ×9
DERMABOND ADVANCED (GAUZE/BANDAGES/DRESSINGS) ×1
DERMABOND ADVANCED .7 DNX12 (GAUZE/BANDAGES/DRESSINGS) ×2 IMPLANT
DRAPE LAPAROSCOPIC ABDOMINAL (DRAPES) ×3 IMPLANT
ELECT BLADE 4.0 EZ CLEAN MEGAD (MISCELLANEOUS) ×3
ELECT REM PT RETURN 9FT ADLT (ELECTROSURGICAL) ×3
ELECTRODE BLDE 4.0 EZ CLN MEGD (MISCELLANEOUS) ×2 IMPLANT
ELECTRODE REM PT RTRN 9FT ADLT (ELECTROSURGICAL) ×2 IMPLANT
FILTER SMOKE EVAC ULPA (FILTER) IMPLANT
GAUZE KITTNER 4X5 RF (MISCELLANEOUS) ×3 IMPLANT
GAUZE SPONGE 4X4 12PLY STRL (GAUZE/BANDAGES/DRESSINGS) ×3 IMPLANT
GLOVE BIO SURGEON STRL SZ 6.5 (GLOVE) ×6 IMPLANT
GLOVE BIOGEL PI IND STRL 7.0 (GLOVE) ×2 IMPLANT
GLOVE BIOGEL PI INDICATOR 7.0 (GLOVE) ×1
GOWN STRL REUS W/ TWL LRG LVL3 (GOWN DISPOSABLE) ×6 IMPLANT
GOWN STRL REUS W/TWL LRG LVL3 (GOWN DISPOSABLE) ×9
KIT BASIN OR (CUSTOM PROCEDURE TRAY) ×3 IMPLANT
KIT SUCTION CATH 14FR (SUCTIONS) ×3 IMPLANT
KIT TURNOVER KIT B (KITS) ×3 IMPLANT
NEEDLE SPNL 18GX3.5 QUINCKE PK (NEEDLE) ×3 IMPLANT
NS IRRIG 1000ML POUR BTL (IV SOLUTION) ×6 IMPLANT
OBTURATOR OPTICAL STANDARD 8MM (TROCAR)
OBTURATOR OPTICAL STND 8 DVNC (TROCAR)
OBTURATOR OPTICALSTD 8 DVNC (TROCAR) IMPLANT
PACK CHEST (CUSTOM PROCEDURE TRAY) ×3 IMPLANT
PAD ARMBOARD 7.5X6 YLW CONV (MISCELLANEOUS) ×6 IMPLANT
PASSER SUT SWANSON 36MM LOOP (INSTRUMENTS) IMPLANT
PENCIL SMOKE EVACUATOR (MISCELLANEOUS) IMPLANT
SEAL CANN UNIV 5-8 DVNC XI (MISCELLANEOUS) IMPLANT
SEAL XI 5MM-8MM UNIVERSAL (MISCELLANEOUS)
SEALANT PROGEL (MISCELLANEOUS) IMPLANT
SEALANT SURG COSEAL 4ML (VASCULAR PRODUCTS) IMPLANT
SEALANT SURG COSEAL 8ML (VASCULAR PRODUCTS) IMPLANT
SLEEVE SUCTION 125 (MISCELLANEOUS) IMPLANT
SOL ANTI FOG 6CC (MISCELLANEOUS) IMPLANT
SOLUTION ANTI FOG 6CC (MISCELLANEOUS)
STOPCOCK 4 WAY LG BORE MALE ST (IV SETS) ×3 IMPLANT
SUT PROLENE 3 0 SH DA (SUTURE) IMPLANT
SUT PROLENE 4 0 RB 1 (SUTURE)
SUT PROLENE 4-0 RB1 .5 CRCL 36 (SUTURE) IMPLANT
SUT SILK  1 MH (SUTURE) ×6
SUT SILK 1 MH (SUTURE) ×4 IMPLANT
SUT SILK 2 0SH CR/8 30 (SUTURE) IMPLANT
SUT SILK 3 0SH CR/8 30 (SUTURE) IMPLANT
SUT VIC AB 1 CTX 18 (SUTURE) IMPLANT
SUT VIC AB 1 CTX 36 (SUTURE)
SUT VIC AB 1 CTX36XBRD ANBCTR (SUTURE) IMPLANT
SUT VIC AB 2-0 CTX 36 (SUTURE) IMPLANT
SUT VIC AB 2-0 UR6 27 (SUTURE) IMPLANT
SUT VIC AB 3-0 X1 27 (SUTURE) IMPLANT
SUT VICRYL 2 TP 1 (SUTURE) IMPLANT
SYR 50ML LL SCALE MARK (SYRINGE) ×3 IMPLANT
SYR 5ML LL (SYRINGE) ×3 IMPLANT
SYR CONTROL 10ML LL (SYRINGE) ×3 IMPLANT
SYSTEM SAHARA CHEST DRAIN ATS (WOUND CARE) ×3 IMPLANT
TAPE CLOTH 4X10 WHT NS (GAUZE/BANDAGES/DRESSINGS) ×3 IMPLANT
TAPE CLOTH SURG 4X10 WHT LF (GAUZE/BANDAGES/DRESSINGS) ×3 IMPLANT
TIP APPLICATOR SPRAY EXTEND 16 (VASCULAR PRODUCTS) IMPLANT
TOWEL GREEN STERILE (TOWEL DISPOSABLE) ×3 IMPLANT
TOWEL GREEN STERILE FF (TOWEL DISPOSABLE) ×3 IMPLANT
TRAP FLUID SMOKE EVACUATOR (MISCELLANEOUS) IMPLANT
TRAP SPECIMEN MUCUS 40CC (MISCELLANEOUS) IMPLANT
TRAY FOLEY MTR SLVR 16FR STAT (SET/KITS/TRAYS/PACK) ×3 IMPLANT
TROCAR FLEXIPATH 20X80 (ENDOMECHANICALS) IMPLANT
TROCAR FLEXIPATH THORACIC 15MM (ENDOMECHANICALS) IMPLANT
TROCAR XCEL 12X100 BLDLESS (ENDOMECHANICALS) ×3 IMPLANT
TROCAR XCEL BLADELESS 5X75MML (TROCAR) ×3 IMPLANT
TUBING EXTENTION W/L.L. (IV SETS) ×3 IMPLANT
TUBING LAP HI FLOW INSUFFLATIO (TUBING) ×3 IMPLANT
TUNNELER SHEATH ON-Q 11GX8 DSP (PAIN MANAGEMENT) IMPLANT
WATER STERILE IRR 1000ML POUR (IV SOLUTION) ×6 IMPLANT

## 2019-08-23 NOTE — Transfer of Care (Signed)
Immediate Anesthesia Transfer of Care Note  Patient: Lauren Massey  Procedure(s) Performed: VIDEO ASSISTED THORACOSCOPY (Right Chest) MECHANICAL PLEURODESIS (Right ) TALC PLEURADESIS (Right )  Patient Location: PACU  Anesthesia Type:General  Level of Consciousness: awake and patient cooperative  Airway & Oxygen Therapy: Patient Spontanous Breathing and Patient connected to face mask oxygen  Post-op Assessment: Report given to RN, Post -op Vital signs reviewed and stable and Patient moving all extremities  Post vital signs: Reviewed and stable  Last Vitals:  Vitals Value Taken Time  BP 146/89 08/23/19 1247  Temp    Pulse 64 08/23/19 1249  Resp 16 08/23/19 1249  SpO2 97 % 08/23/19 1249  Vitals shown include unvalidated device data.  Last Pain:  Vitals:   08/23/19 0400  TempSrc:   PainSc: 0-No pain         Complications: No complications documented.

## 2019-08-23 NOTE — Plan of Care (Signed)
  Problem: Education: Goal: Knowledge of disease or condition will improve Outcome: Progressing   Problem: Activity: Goal: Risk for activity intolerance will decrease Outcome: Progressing   Problem: Cardiac: Goal: Will achieve and/or maintain hemodynamic stability Outcome: Progressing   Problem: Clinical Measurements: Goal: Postoperative complications will be avoided or minimized Outcome: Progressing   Problem: Respiratory: Goal: Respiratory status will improve Outcome: Progressing   Problem: Pain Management: Goal: Pain level will decrease Outcome: Progressing   Problem: Skin Integrity: Goal: Wound healing without signs and symptoms infection will improve Outcome: Progressing   

## 2019-08-23 NOTE — Progress Notes (Signed)
PROGRESS NOTE  Lauren Massey  DOB: January 05, 1956  PCP: Hoyt Koch, MD UXL:[id number]  DOA: 08/21/2019  LOS: 2 days   Chief Complaint  Patient presents with  . Shortness of Breath   Brief narrative: Lauren Massey is a 64 y.o. female with history of sarcoidosis who has had recurrent pneumothorax and underwent chemical pleurodesis last month. She followed up with pulmonologist on 7/28. She complained of shortness of breath, worsening cough, was found to be hypoxic.  Chest x-ray was obtained which showed right-sided pneumothorax and hence referred to the ER.   In the ED, patient required 2 L oxygen to maintain saturation. Chest x-ray confirmed right-sided 20 to 25% pneumothorax.   ER physician discussed with on-call cardiothoracic surgeon Dr. Kipp Brood. Labs showed hemoglobin of 11.7 WBC 3.9. Covid test negative  EKG showed normal sinus rhythm. Patient was admitted to hospitalist service.   Subjective: Patient was seen and examined this morning prior to the procedure.  Note entered late. This afternoon, patient underwent right VATS, mechanical pleurodesis and talc diuresis.  Assessment/Plan: Acute respiratory failure with hypoxia Recurrent pneumothorax related to pulmonary sarcoidosis -Presented with worsening shortness of breath, cough, hypoxia.   -Chest x-ray showed right-sided 2025% pneumothorax.   -Patient had chemical pleurodesis of the right side last month only. -Cardiothoracic surgeon consult appreciated. -This afternoon, patient underwent right VATS, mechanical pleurodesis and talc diuresis.  History of sarcoidosis  -continue methotrexate 7.5 mg weekly.  Hypertension  -Continue to monitor blood pressure on hydrochlorothiazide. -Repeat labs tomorrow.  Mobility: Encourage ambulation Code Status:   Code Status: Full Code  Nutritional status: Body mass index is 23.67 kg/m.     Diet Order            Diet NPO time specified Except for: Ice Chips,  Other (See Comments)  Diet effective now                 DVT prophylaxis: enoxaparin (LOVENOX) injection 40 mg Start: 08/23/19 2200 SCD's Start: 08/23/19 1515 SCDs Start: 08/21/19 2142   Antimicrobials:  None Fluid: None  Consultants: Cardiothoracic surgery Family Communication:  Not at bedside  Status is: Inpatient  Remains inpatient appropriate because:Ongoing diagnostic testing needed not appropriate for outpatient work up and IV treatments appropriate due to intensity of illness or inability to take PO   Dispo: The patient is from: Home              Anticipated d/c is to: Home              Anticipated d/c date is: 3 days              Patient currently is not medically stable to d/c.   Infusions:  . sodium chloride    . 0.9 % NaCl with KCl 20 mEq / L    .  ceFAZolin (ANCEF) IV      Scheduled Meds: . acetaminophen  1,000 mg Oral Q6H   Or  . acetaminophen (TYLENOL) oral liquid 160 mg/5 mL  1,000 mg Oral Q6H  . bisacodyl  10 mg Oral Daily  . Chlorhexidine Gluconate Cloth  6 each Topical Daily  . enoxaparin (LOVENOX) injection  40 mg Subcutaneous Q24H  . fentaNYL      . fentaNYL   Intravenous Q4H  . fentaNYL      . folic acid  5 mg Oral QHS  . [START ON 08/24/2019] hydrochlorothiazide  12.5 mg Oral Daily  . methotrexate  7.5 mg Oral Weekly  .  prednisoLONE acetate  1 drop Right Eye QID  . senna-docusate  1 tablet Oral QHS    Antimicrobials: Anti-infectives (From admission, onward)   Start     Dose/Rate Route Frequency Ordered Stop   08/23/19 1830  ceFAZolin (ANCEF) IVPB 2g/100 mL premix     Discontinue     2 g 200 mL/hr over 30 Minutes Intravenous Every 8 hours 08/23/19 1514 08/24/19 1029   08/22/19 2100  ceFAZolin (ANCEF) IVPB 2g/100 mL premix        2 g 200 mL/hr over 30 Minutes Intravenous 30 min pre-op 08/22/19 2054 08/23/19 1047      PRN meds: Place/Maintain arterial line **AND** sodium chloride, albuterol, diphenhydrAMINE **OR** diphenhydrAMINE,  naloxone **AND** sodium chloride flush, ondansetron **OR** ondansetron (ZOFRAN) IV, oxyCODONE, traMADol   Objective: Vitals:   08/23/19 1549 08/23/19 1617  BP:    Pulse: 61 69  Resp: 16 15  Temp: (!) 97.5 F (36.4 C)   SpO2: 100% 100%    Intake/Output Summary (Last 24 hours) at 08/23/2019 1642 Last data filed at 08/23/2019 1549 Gross per 24 hour  Intake 1540 ml  Output 700 ml  Net 840 ml   Filed Weights   08/21/19 1826 08/21/19 2348 08/22/19 0000  Weight: 58.5 kg 58.7 kg 58.7 kg   Weight change:  Body mass index is 23.67 kg/m.   Physical Exam prior to procedure: General exam: Appears calm and comfortable.  Not in physical distress Skin: No rashes, lesions or ulcers. HEENT: Atraumatic, normocephalic, supple neck, no obvious bleeding Lungs: Clear to auscultation on the left, diminished air entry on the right otherwise clear CVS: Regular rate and rhythm, no murmur GI/Abd soft, nontender, nondistended, bowel sound present CNS: Alert, awake, oriented x3 Psychiatry: Mood appropriate Extremities: No pedal edema, no calf tenderness  Data Review: I have personally reviewed the laboratory data and studies available.  Recent Labs  Lab 08/21/19 1844 08/22/19 0055 08/22/19 2106  WBC 3.9* 4.0 4.4  NEUTROABS 1.8  --   --   HGB 11.7* 11.1* 11.8*  HCT 38.1 35.7* 37.8  MCV 90.9 89.9 89.8  PLT 183 156 183   Recent Labs  Lab 08/21/19 1844 08/22/19 0055 08/22/19 2106  NA 137 137 135  K 3.6 3.7 3.9  CL 102 102 101  CO2 27 26 25   GLUCOSE 95 99 108*  BUN 21 21 15   CREATININE 0.89 0.85 0.86  CALCIUM 9.4 9.2 9.5   No results found for: HGBA1C     Component Value Date/Time   CHOL 233 (A) 11/17/2018 0000   TRIG 106 11/17/2018 0000   HDL 77 (A) 11/17/2018 0000   CHOLHDL 4 02/05/2018 1010   VLDL 18.2 02/05/2018 1010   LDLCALC 18 11/17/2018 0000    Signed, Terrilee Croak, MD [facility] Pager: [phone] (Secure Chat  preferred). 08/23/2019

## 2019-08-23 NOTE — Brief Op Note (Addendum)
      [address]       [place] [address]             [phone]     08/23/2019  12:21 PM  PATIENT:  Lauren Massey  64 y.o. female  PRE-OPERATIVE DIAGNOSIS:  1. RECURRENT RIGHT SPONTANEOUS PNEUMOTHORAX 2. SARCOIDOSIS  POST-OPERATIVE DIAGNOSIS:  1. RECURRENT RIGHT SPONTANEOUS PNEUMOTHORAX 2. SARCOIDOSIS  PROCEDURE:   RIGHT VIDEO ASSISTED THORACOSCOPY ,MECHANICAL PLEURODESIS, and TALC PLEURADESIS   SURGEON:  Surgeon(s) and Role:    Grace Isaac, MD - Primary  PHYSICIAN ASSISTANT: Lars Pinks PA-C  ANESTHESIA:   general  EBL:  Minimal, per anesthesia record   BLOOD ADMINISTERED:none  DRAINS: 54 Blake drain placed in the right pleural space   LOCAL MEDICATIONS USED:  OTHER Exparel  COUNTS CORRECT:  YES  DICTATION: .Dragon Dictation  PLAN OF CARE: Admit to inpatient   PATIENT DISPOSITION:  PACU - hemodynamically stable.   Delay start of Pharmacological VTE agent (>24hrs) due to surgical blood loss or risk of bleeding: yes

## 2019-08-23 NOTE — Discharge Instructions (Signed)
Thoracoscopy, Care After This sheet gives you information about how to care for yourself after your procedure. Your health care provider may also give you more specific instructions. If you have problems or questions, contact your health care provider. What can I expect after the procedure? After the procedure, it is common to have pain and soreness in the surgical area. Follow these instructions at home: Incision care   Follow instructions from your health care provider about how to take care of your incision. Make sure you: ? Wash your hands with soap and water before you change your bandage (dressing). If soap and water are not available, use hand sanitizer. ? Change your dressing as told by your health care provider. ? Leave stitches (sutures), skin glue, or adhesive strips in place. These skin closures may need to stay in place for 2 weeks or longer. If adhesive strip edges start to loosen and curl up, you may trim the loose edges. Do not remove adhesive strips completely unless your health care provider tells you to do that.  Check your incision areas every day for signs of infection. Check for: ? Redness, swelling, or pain. ? Fluid or blood. ? Warmth. ? Pus or a bad smell.  Do not take baths, swim, or use a hot tub until your health care provider approves. You may take showers. Medicines  Take over-the-counter and prescription medicines only as told by your health care provider.  If you were prescribed an antibiotic medicine, take it as told by your health care provider. Do not stop taking the antibiotic even if you start to feel better.  Do not drive or use heavy machinery while taking prescription pain medicine.  If you are taking prescription pain medicine, take actions to prevent or treat constipation. Your health care provider may recommend that you: ? Drink enough fluid to keep your urine pale yellow. ? Eat foods that are high in fiber, such as fresh fruits and vegetables,  whole grains, and beans. ? Limit foods that are high in fat and processed sugars, such as fried and sweet foods. ? Take an over-the-counter or prescription medicine for constipation. Managing pain, stiffness, and swelling   If directed, put ice on the affected area: ? Put ice in a plastic bag. ? Place a towel between your skin and the bag. ? Leave the ice on for 20 minutes, 2-3 times a day. Preventing lung infection  To prevent pneumonia and to keep your lungs healthy: ? Try to cough often. If it hurts to cough, hold a pillow against your chest as you cough. ? Take deep breaths or do breathing exercises as instructed by your health care provider. ? If you were given an incentive spirometer, use it as directed by your health care provider. General instructions  Do not lift anything that is heavier than 10 lb (4.5 kg), or the limit that you are told, until your health care provider says that it is safe.  Do not use any products that contain nicotine or tobacco, such as cigarettes and e-cigarettes. These can delay healing after surgery. If you need help quitting, ask your health care provider.  Avoid driving until your health care provider approves.  If you have a chest drainage tube, care for it as instructed by your health care provider. Do not travel by airplane after the chest drainage tube is removed until your health care provider approves.  Keep all follow-up visits as told by your health care provider. This is important. Contact   a health care provider if:  You have a fever.  Pain medicines do not ease your pain.  You have redness, swelling, or increasing pain in your incision area.  You develop a cough that does not go away, or you are coughing up mucus that is yellow or green. Get help right away if:  You have fluid, blood, or pus coming from your incision.  There is a bad smell coming from your incision or dressing.  You develop a rash.  You cough up blood.  You  develop light-headedness, or you feel faint.  You have difficulty breathing.  You develop chest pain.  Your heartbeat feels irregular or very fast. These symptoms may represent a serious problem that is an emergency. Do not wait to see if the symptoms will go away. Get medical help right away. Call your local emergency services (911 in the [place] Do not drive yourself to the hospital. Summary  Follow instructions from your health care provider about how to take care of your incision.  Do not drive or use heavy machinery while taking prescription pain medicine.  Leave stitches (sutures), skin glue, or adhesive strips in place.  Check your incision areas every day for signs of infection. This information is not intended to replace advice given to you by your health care provider. Make sure you discuss any questions you have with your health care provider. Document Revised: 12/23/2016 Document Reviewed: 12/20/2016 Elsevier Patient Education  2020 [employer]  

## 2019-08-23 NOTE — Anesthesia Procedure Notes (Signed)
Arterial Line Insertion Start/End7/30/2021 10:10 AM, 08/23/2019 10:15 AM Performed by: Babs Bertin, CRNA, CRNA  Preanesthetic checklist: patient identified, IV checked, site marked, risks and benefits discussed, surgical consent, monitors and equipment checked, pre-op evaluation, timeout performed and anesthesia consent Lidocaine 1% used for infiltration Left was placed Catheter size: 20 G  Attempts: 2 (x1 prior attempt by Cira Servant CRNA) Procedure performed without using ultrasound guided technique. Following insertion, dressing applied and Biopatch. Post procedure assessment: normal  Patient tolerated the procedure well with no immediate complications.

## 2019-08-23 NOTE — Progress Notes (Signed)
Pre Procedure note for inpatients:   Lauren Massey has been scheduled for Procedure(s): VIDEO ASSISTED THORACOSCOPY (Right) MECHANICAL PLEURODESIS (Right) possible TALC PLEURADESIS (Right) today. The various methods of treatment have been discussed with the patient. After consideration of the risks, benefits and treatment options the patient has consented to the planned procedure.   The patient has been seen and labs reviewed. There are no changes in the patient's condition to prevent proceeding with the planned procedure today.  Recent labs:  Lab Results  Component Value Date   WBC 4.4 08/22/2019   HGB 11.8 (L) 08/22/2019   HCT 37.8 08/22/2019   PLT 183 08/22/2019   GLUCOSE 108 (H) 08/22/2019   CHOL 233 (A) 11/17/2018   TRIG 106 11/17/2018   HDL 77 (A) 11/17/2018   LDLCALC 18 11/17/2018   ALT 21 08/22/2019   AST 27 08/22/2019   NA 135 08/22/2019   K 3.9 08/22/2019   CL 101 08/22/2019   CREATININE 0.86 08/22/2019   BUN 15 08/22/2019   CO2 25 08/22/2019   TSH 1.87 08/03/2015   INR 1.1 08/22/2019    Grace Isaac, MD 08/23/2019 9:13 AM

## 2019-08-23 NOTE — Anesthesia Preprocedure Evaluation (Signed)
Anesthesia Evaluation  Patient identified by MRN, date of birth, ID band Patient awake    Reviewed: Allergy & Precautions, H&P , NPO status , Patient's Chart, lab work & pertinent test results  History of Anesthesia Complications Negative for: history of anesthetic complications  Airway Mallampati: II  TM Distance: >3 FB Neck ROM: Full    Dental no notable dental hx. (+) Partial Lower, Partial Upper, Dental Advisory Given   Pulmonary neg pulmonary ROS,    Pulmonary exam normal breath sounds clear to auscultation       Cardiovascular Exercise Tolerance: Good negative cardio ROS   Rhythm:Regular Rate:Normal     Neuro/Psych negative neurological ROS  negative psych ROS   GI/Hepatic negative GI ROS, Neg liver ROS,   Endo/Other  negative endocrine ROS  Renal/GU negative Renal ROS  negative genitourinary   Musculoskeletal   Abdominal   Peds  Hematology negative hematology ROS (+)   Anesthesia Other Findings   Reproductive/Obstetrics negative OB ROS                             Anesthesia Physical  Anesthesia Plan  ASA: III  Anesthesia Plan: General   Post-op Pain Management:    Induction: Intravenous  PONV Risk Score and Plan: 4 or greater and Ondansetron, Dexamethasone and Midazolam  Airway Management Planned: Double Lumen EBT  Additional Equipment: Arterial line  Intra-op Plan:   Post-operative Plan: Extubation in OR  Informed Consent: I have reviewed the patients History and Physical, chart, labs and discussed the procedure including the risks, benefits and alternatives for the proposed anesthesia with the patient or authorized representative who has indicated his/her understanding and acceptance.     Dental advisory given  Plan Discussed with: CRNA, Anesthesiologist and Surgeon  Anesthesia Plan Comments:         Anesthesia Quick Evaluation

## 2019-08-23 NOTE — Anesthesia Postprocedure Evaluation (Signed)
Anesthesia Post Note  Patient: Lauren Massey  Procedure(s) Performed: VIDEO ASSISTED THORACOSCOPY (Right Chest) MECHANICAL PLEURODESIS (Right ) TALC PLEURADESIS (Right )     Anesthesia Post Evaluation No complications documented.  Last Vitals:  Vitals:   08/23/19 1347 08/23/19 1400  BP: (!) 130/75   Pulse: 57 56  Resp: (!) 11 (!) 10  Temp:    SpO2: 100% 100%                 Bunny Kleist DANIEL

## 2019-08-23 NOTE — Anesthesia Procedure Notes (Signed)
Procedure Name: Intubation Date/Time: 08/23/2019 10:44 AM Performed by: Lowella Dell, CRNA Pre-anesthesia Checklist: Patient identified, Emergency Drugs available, Suction available, Patient being monitored and Timeout performed Patient Re-evaluated:Patient Re-evaluated prior to induction Oxygen Delivery Method: Circle system utilized Preoxygenation: Pre-oxygenation with 100% oxygen Induction Type: IV induction Ventilation: Mask ventilation without difficulty Laryngoscope Size: Mac and 3 Grade View: Grade I Endobronchial tube: Left, EBT position confirmed by auscultation, EBT position confirmed by fiberoptic bronchoscope and Double lumen EBT and 35 Fr Number of attempts: 1 Airway Equipment and Method: Stylet and Fiberoptic brochoscope Placement Confirmation: ETT inserted through vocal cords under direct vision,  positive ETCO2 and breath sounds checked- equal and bilateral Secured at: 26 cm Tube secured with: Tape Dental Injury: Teeth and Oropharynx as per pre-operative assessment  Difficulty Due To: Difficulty was anticipated Comments: Known prior difficult intubation.  However, today pt was easy MV, easy atraumatic intubation with 35 VivaSight DLT.

## 2019-08-24 ENCOUNTER — Inpatient Hospital Stay (HOSPITAL_COMMUNITY): Payer: 59

## 2019-08-24 ENCOUNTER — Encounter (HOSPITAL_COMMUNITY): Payer: Self-pay | Admitting: Cardiothoracic Surgery

## 2019-08-24 LAB — CBC
HCT: 35.9 % — ABNORMAL LOW (ref 36.0–46.0)
Hemoglobin: 11 g/dL — ABNORMAL LOW (ref 12.0–15.0)
MCH: 28.1 pg (ref 26.0–34.0)
MCHC: 30.6 g/dL (ref 30.0–36.0)
MCV: 91.8 fL (ref 80.0–100.0)
Platelets: 169 10*3/uL (ref 150–400)
RBC: 3.91 MIL/uL (ref 3.87–5.11)
RDW: 13.5 % (ref 11.5–15.5)
WBC: 5.7 10*3/uL (ref 4.0–10.5)
nRBC: 0 % (ref 0.0–0.2)

## 2019-08-24 LAB — HEMOGLOBIN A1C
Hgb A1c MFr Bld: 6 % — ABNORMAL HIGH (ref 4.8–5.6)
Mean Plasma Glucose: 125.5 mg/dL

## 2019-08-24 LAB — BASIC METABOLIC PANEL
Anion gap: 7 (ref 5–15)
BUN: 12 mg/dL (ref 8–23)
CO2: 27 mmol/L (ref 22–32)
Calcium: 9.1 mg/dL (ref 8.9–10.3)
Chloride: 100 mmol/L (ref 98–111)
Creatinine, Ser: 0.71 mg/dL (ref 0.44–1.00)
GFR calc Af Amer: >60 mL/min (ref >60)
GFR calc non Af Amer: >60 mL/min (ref >60)
Glucose, Bld: 119 mg/dL — ABNORMAL HIGH (ref 70–99)
Potassium: 4.1 mmol/L (ref 3.5–5.1)
Sodium: 134 mmol/L — ABNORMAL LOW (ref 135–145)

## 2019-08-24 MED ORDER — KETOROLAC TROMETHAMINE 30 MG/ML IJ SOLN
30.0000 mg | Freq: Once | INTRAMUSCULAR | Status: AC
  Administered 2019-08-24: 30 mg via INTRAVENOUS
  Filled 2019-08-24: qty 1

## 2019-08-24 NOTE — Progress Notes (Signed)
PROGRESS NOTE  Lauren Massey  DOB: Jun 27, 1955  PCP: Hoyt Koch, MD RSW:[id number]  DOA: 08/21/2019  LOS: 3 days   Chief Complaint  Patient presents with  . Shortness of Breath   Brief narrative: Lauren Massey is a 64 y.o. female with history of sarcoidosis who has had recurrent pneumothorax and underwent chemical pleurodesis last month. She followed up with pulmonologist on 7/28. She complained of shortness of breath, worsening cough, was found to be hypoxic.  Chest x-ray was obtained which showed right-sided pneumothorax and hence referred to the ER.   In the ED, patient required 2 L oxygen to maintain saturation. Chest x-ray confirmed right-sided 20 to 25% pneumothorax.   ER physician discussed with on-call cardiothoracic surgeon Dr. Kipp Brood. Labs showed hemoglobin of 11.7 WBC 3.9. Covid test negative  EKG showed normal sinus rhythm. Patient was admitted to hospitalist service.  7/30, patient underwent right VATS, mechanical pleurodesis and talc diuresis.  Subjective: Patient was seen and examined this morning.  On PCA pump for pain control.  Chest tube on the right side.  Noted a blood sugar reading of 206 last night.  Assessment/Plan: Acute respiratory failure with hypoxia Recurrent pneumothorax related to pulmonary sarcoidosis -Presented with worsening shortness of breath, cough, hypoxia.   -Chest x-ray showed right-sided 20-25% pneumothorax.   -Cardiothoracic consult appreciated. -7/30, patient underwent right VATS, mechanical pleurodesis and talc diuresis. -Right-sided chest tube on suction. -Continue pain control with oral oxycodone  History of sarcoidosis  -continue methotrexate 7.5 mg weekly.  Hypertension  -Continue to monitor blood pressure on hydrochlorothiazide. -Repeat labs tomorrow.  Hyperglycemia -Noted blood sugar level of 206 last night.  Obtain A1c.  Mobility: Encourage ambulation Code Status:   Code Status: Full Code    Nutritional status: Body mass index is 23.67 kg/m.     Diet Order            Diet regular Room service appropriate? Yes; Fluid consistency: Thin  Diet effective now                 DVT prophylaxis: enoxaparin (LOVENOX) injection 40 mg Start: 08/23/19 2200 SCD's Start: 08/23/19 1515 SCDs Start: 08/21/19 2142   Antimicrobials:  None Fluid: None  Consultants: Cardiothoracic surgery Family Communication:  Not at bedside  Status is: Inpatient  Remains inpatient appropriate because:Ongoing diagnostic testing needed not appropriate for outpatient work up and IV treatments appropriate due to intensity of illness or inability to take PO   Dispo: The patient is from: Home              Anticipated d/c is to: Home              Anticipated d/c date is: 3 days              Patient currently is not medically stable to d/c.   Infusions:  . sodium chloride    . 0.9 % NaCl with KCl 20 mEq / L 75 mL/hr at 08/24/19 0739    Scheduled Meds: . acetaminophen  1,000 mg Oral Q6H   Or  . acetaminophen (TYLENOL) oral liquid 160 mg/5 mL  1,000 mg Oral Q6H  . bisacodyl  10 mg Oral Daily  . Chlorhexidine Gluconate Cloth  6 each Topical Daily  . enoxaparin (LOVENOX) injection  40 mg Subcutaneous Q24H  . fentaNYL   Intravenous K9F  . folic acid  5 mg Oral QHS  . hydrochlorothiazide  12.5 mg Oral Daily  . methotrexate  7.5  mg Oral Weekly  . prednisoLONE acetate  1 drop Right Eye QID  . senna-docusate  1 tablet Oral QHS    Antimicrobials: Anti-infectives (From admission, onward)   Start     Dose/Rate Route Frequency Ordered Stop   08/23/19 1830  ceFAZolin (ANCEF) IVPB 2g/100 mL premix        2 g 200 mL/hr over 30 Minutes Intravenous Every 8 hours 08/23/19 1514 08/24/19 0623   08/22/19 2100  ceFAZolin (ANCEF) IVPB 2g/100 mL premix        2 g 200 mL/hr over 30 Minutes Intravenous 30 min pre-op 08/22/19 2054 08/23/19 1047      PRN meds: Place/Maintain arterial line **AND** sodium  chloride, albuterol, diphenhydrAMINE **OR** diphenhydrAMINE, naloxone **AND** sodium chloride flush, ondansetron **OR** ondansetron (ZOFRAN) IV, oxyCODONE, traMADol   Objective: Vitals:   08/24/19 0742 08/24/19 0803  BP:    Pulse:    Resp: 14   Temp:  98.4 F (36.9 C)  SpO2: 95%     Intake/Output Summary (Last 24 hours) at 08/24/2019 1035 Last data filed at 08/24/2019 0321 Gross per 24 hour  Intake 1300 ml  Output 1222 ml  Net 78 ml   Filed Weights   08/21/19 1826 08/21/19 2348 08/22/19 0000  Weight: 58.5 kg 58.7 kg 58.7 kg   Weight change:  Body mass index is 23.67 kg/m.   Physical Exam prior to procedure: General exam: Appears calm and comfortable.  Pain controlled Skin: No rashes, lesions or ulcers. HEENT: Atraumatic, normocephalic, supple neck, no obvious bleeding Lungs: Clear to auscultation on the left.  Chest tube in the right CVS: Regular rate and rhythm, no murmur GI/Abd soft, nontender, nondistended, bowel sound present CNS: Alert, awake, oriented x3 Psychiatry: Mood appropriate Extremities: No pedal edema, no calf tenderness  Data Review: I have personally reviewed the laboratory data and studies available.  Recent Labs  Lab 08/21/19 1844 08/22/19 0055 08/22/19 2106 08/24/19 0047  WBC 3.9* 4.0 4.4 5.7  NEUTROABS 1.8  --   --   --   HGB 11.7* 11.1* 11.8* 11.0*  HCT 38.1 35.7* 37.8 35.9*  MCV 90.9 89.9 89.8 91.8  PLT 183 156 183 169   Recent Labs  Lab 08/21/19 1844 08/22/19 0055 08/22/19 2106 08/24/19 0047  NA 137 137 135 134*  K 3.6 3.7 3.9 4.1  CL 102 102 101 100  CO2 27 26 25 27   GLUCOSE 95 99 108* 119*  BUN 21 21 15 12   CREATININE 0.89 0.85 0.86 0.71  CALCIUM 9.4 9.2 9.5 9.1   No results found for: HGBA1C     Component Value Date/Time   CHOL 233 (A) 11/17/2018 0000   TRIG 106 11/17/2018 0000   HDL 77 (A) 11/17/2018 0000   CHOLHDL 4 02/05/2018 1010   VLDL 18.2 02/05/2018 1010   LDLCALC 18 11/17/2018 0000    Signed, Terrilee Croak, MD [facility] Pager: [phone] (Secure Chat preferred). 08/24/2019

## 2019-08-24 NOTE — Op Note (Signed)
Lauren Massey, [facility] MEDICAL RECORD WH:[id number] ACCOUNT [id number] DATE OF BIRTH:1955-09-29 FACILITY: [facility] LOCATION: [facility] PHYSICIAN:Dorrian Doggett Maryruth Bun, MD  OPERATIVE REPORT  DATE OF PROCEDURE:  08/23/2019  PREOPERATIVE DIAGNOSIS:  Recurrent right pneumothorax with underlying sarcoid.  POSTOPERATIVE DIAGNOSIS:  Recurrent right pneumothorax with underlying sarcoid.  SURGICAL PROCEDURE:   1.  Redo right video-assisted thoracoscopy with mechanical pleurodesis and talc pleurodesis.  2.  Placement of right chest tube.  SURGEON:  Lanelle Bal, MD  FIRST ASSISTANT:  Lars Pinks, PA.  BRIEF HISTORY:  The patient is a 64 year old female with known pulmonary sarcoid.  She had presented in early 2021 with recurrent right pneumothorax.  In February, a right robotic apical pleurectomy and pleurodesis was done.  She tolerated this well,  initially with a good response and followup.  However, last month, she re-presented with a basilar and lower lateral pneumothorax.  This was on the right.  This was treated with a small bore chest tube and doxycycline instillation.  Pneumothorax at that  time resolved; however, now the patient returned to the pulmonary office for a routine followup chest x-ray, was noted to be short of breath with a recurrent basilar and lateral pneumothorax with the apex of the lung well stuck.  The patient was admitted  and placed on oxygen.  A CT scan was done that confirmed lateral and basilar pneumothorax.  With the recurrence, in spite of the previous surgery, and the patient's poor tolerance for recurrent pneumothorax, I recommended to her that we repeat a  thoracoscopy, concentrating on increased mechanical pleurodesis lower in the chest, replacement of the chest tube and talc pleurodesis.  The patient was agreeable and signed informed consent.  DESCRIPTION OF PROCEDURE:  Dr. Linna Caprice placed a double lumen endotracheal tube.  The patient was turned in lateral  decubitus position with right side up.  The right side preoperatively had been marked.  The right chest was prepped with Betadine, draped in  a sterile manner.  A timeout was performed.  We then proceeded with entering the chest with a 5 mm scope.  Following the known space on the CT scan, we infiltrated Exparel and then using a 0-degree scope and Optiview port, placed a 5 mm port  approximately the 9th intercostal space midaxillary line.  With this technique, we were able to enter the chest cavity without injuring any underlying lung.  On visual exploration of the chest, the upper portion and upper lobe was densely adherent, but  there was as suggested on CT scan, no adhesions inferiorly along the diaphragm or laterally, with the exception of anteriorly some adhesions to where the previous port sites and chest tube had been.  We then made a second incision slightly more posterior  and placed a 12 mm port.  Through this port and with visualization, we were able to perform a mechanical pleurodesis extensively along the lateral wall, extending down onto the diaphragm.  With this completed, we then elected to place 2 g of sterile  powdered talc was insufflated into the space.  Good dispersion of this was confirmed.  We then used the 12 mm port site to place a 28 Blake drain.  The 5 mm port site was then removed and closed with a UR-6 suture and a 4-0 subcuticular stitch.  The lung  had reinflated nicely.  There was no air leak.  Chest tube will be left on suction to ensure good adhesion over the next 48 hours.  Sponge and needle count was reported  as correct.  Blood loss was minimal.  The patient was awakened and extubated in the  operating room, having tolerated the procedure well and was transferred to the recovery room for postoperative observation.  VN/NUANCE  D:08/24/2019 T:08/24/2019 JOB:[id number]

## 2019-08-24 NOTE — Progress Notes (Addendum)
      [address]       [place] [address]             [phone]      1 Day Post-Op Procedure(s) (LRB): VIDEO ASSISTED THORACOSCOPY (Right) MECHANICAL PLEURODESIS (Right) TALC PLEURADESIS (Right)   Subjective:  Doing okay.  Having some pain this morning as she slept through the night and didn't take pain medication.  Otherwise she is without complaints.  Objective: Vital signs in last 24 hours: Temp:  [97.5 F (36.4 C)-98.7 F (37.1 C)] 98.4 F (36.9 C) (07/31 0803) Pulse Rate:  [56-72] 61 (07/31 0320) Cardiac Rhythm: Normal sinus rhythm (07/31 0700) Resp:  [9-20] 14 (07/31 0742) BP: (102-146)/(62-89) 109/70 (07/31 0320) SpO2:  [88 %-100 %] 95 % (07/31 0742) Arterial Line BP: (98-148)/(64-73) 98/66 (07/30 1530) FiO2 (%):  [98 %] 98 % (07/31 0742)  Intake/Output from previous day: 07/30 0701 - 07/31 0700 In: 1300 [I.V.:1050; IV Piggyback:250] Out: 5284 [Urine:1100; Blood:50; Chest Tube:72]  General appearance: alert, cooperative and no distress Heart: regular rate and rhythm Lungs: clear to auscultation bilaterally Abdomen: soft, non-tender; bowel sounds normal; no masses,  no organomegaly Extremities: extremities normal, atraumatic, no cyanosis or edema Wound: clean and dry  Lab Results: Recent Labs    08/22/19 2106 08/24/19 0047  WBC 4.4 5.7  HGB 11.8* 11.0*  HCT 37.8 35.9*  PLT 183 169   BMET:  Recent Labs    08/22/19 2106 08/24/19 0047  NA 135 134*  K 3.9 4.1  CL 101 100  CO2 25 27  GLUCOSE 108* 119*  BUN 15 12  CREATININE 0.86 0.71  CALCIUM 9.5 9.1    PT/INR:  Recent Labs    08/22/19 2106  LABPROT 13.4  INR 1.1   ABG    Component Value Date/Time   PHART 7.418 08/22/2019 2108   HCO3 27.8 08/22/2019 2108   O2SAT 87.5 08/22/2019 2108   CBG (last 3)  Recent Labs    08/23/19 2118  GLUCAP 206*    Assessment/Plan: S/P Procedure(s) (LRB): VIDEO ASSISTED THORACOSCOPY (Right) MECHANICAL PLEURODESIS (Right) TALC  PLEURADESIS (Right)  1. Chest tube- no air leak present, will leave chest tube on suction again today 2. Pulm- CXR with no evidence of pneumothorax 3. Pain control- will give a 1 time dose of Toradol, continue oral Oxy, PCA 4. Dispo- patient stable, leave chest tube to suction today, repeat CXR in AM, care per primary   LOS: 3 days    Ellwood Handler, PA-C 08/24/2019 Pt seen and examined; chart/films reviewed personally. Agree with documentation above; leave chest tube to suction today. Khadeeja Elden Z. Orvan Seen, [place]

## 2019-08-25 ENCOUNTER — Inpatient Hospital Stay (HOSPITAL_COMMUNITY): Payer: 59

## 2019-08-25 LAB — CBC
HCT: 34.7 % — ABNORMAL LOW (ref 36.0–46.0)
Hemoglobin: 10.6 g/dL — ABNORMAL LOW (ref 12.0–15.0)
MCH: 27.2 pg (ref 26.0–34.0)
MCHC: 30.5 g/dL (ref 30.0–36.0)
MCV: 89.2 fL (ref 80.0–100.0)
Platelets: 154 10*3/uL (ref 150–400)
RBC: 3.89 MIL/uL (ref 3.87–5.11)
RDW: 13.5 % (ref 11.5–15.5)
WBC: 5.5 10*3/uL (ref 4.0–10.5)
nRBC: 0 % (ref 0.0–0.2)

## 2019-08-25 LAB — COMPREHENSIVE METABOLIC PANEL
ALT: 30 U/L (ref 0–44)
AST: 45 U/L — ABNORMAL HIGH (ref 15–41)
Albumin: 2.6 g/dL — ABNORMAL LOW (ref 3.5–5.0)
Alkaline Phosphatase: 51 U/L (ref 38–126)
Anion gap: 7 (ref 5–15)
BUN: 6 mg/dL — ABNORMAL LOW (ref 8–23)
CO2: 25 mmol/L (ref 22–32)
Calcium: 8.5 mg/dL — ABNORMAL LOW (ref 8.9–10.3)
Chloride: 101 mmol/L (ref 98–111)
Creatinine, Ser: 0.63 mg/dL (ref 0.44–1.00)
GFR calc Af Amer: >60 mL/min (ref >60)
GFR calc non Af Amer: >60 mL/min (ref >60)
Glucose, Bld: 102 mg/dL — ABNORMAL HIGH (ref 70–99)
Potassium: 3.8 mmol/L (ref 3.5–5.1)
Sodium: 133 mmol/L — ABNORMAL LOW (ref 135–145)
Total Bilirubin: 0.5 mg/dL (ref 0.3–1.2)
Total Protein: 7.9 g/dL (ref 6.5–8.1)

## 2019-08-25 NOTE — Progress Notes (Addendum)
      [address]       [place] [address]             [phone]      2 Days Post-Op Procedure(s) (LRB): VIDEO ASSISTED THORACOSCOPY (Right) MECHANICAL PLEURODESIS (Right) TALC PLEURADESIS (Right)   Subjective:  Up in chair, no complaints.  Objective: Vital signs in last 24 hours: Temp:  [98.5 F (36.9 C)-100.1 F (37.8 C)] 98.5 F (36.9 C) (08/01 0405) Pulse Rate:  [73-93] 73 (08/01 0405) Cardiac Rhythm: Normal sinus rhythm (08/01 0405) Resp:  [20-23] 21 (08/01 0405) BP: (129-143)/(76-79) 130/79 (08/01 0405) SpO2:  [93 %-97 %] 97 % (08/01 0405)  Intake/Output from previous day: 07/31 0701 - 08/01 0700 In: 2329.3 [I.V.:2329.3] Out: 690 [Urine:600; Chest Tube:90]  General appearance: alert, cooperative and no distress Heart: regular rate and rhythm Lungs: clear to auscultation bilaterally Wound: clean and dry  Lab Results: Recent Labs    08/24/19 0047 08/25/19 0129  WBC 5.7 5.5  HGB 11.0* 10.6*  HCT 35.9* 34.7*  PLT 169 154   BMET:  Recent Labs    08/24/19 0047 08/25/19 0129  NA 134* 133*  K 4.1 3.8  CL 100 101  CO2 27 25  GLUCOSE 119* 102*  BUN 12 6*  CREATININE 0.71 0.63  CALCIUM 9.1 8.5*    PT/INR:  Recent Labs    08/22/19 2106  LABPROT 13.4  INR 1.1   ABG    Component Value Date/Time   PHART 7.418 08/22/2019 2108   HCO3 27.8 08/22/2019 2108   O2SAT 87.5 08/22/2019 2108   CBG (last 3)  Recent Labs    08/23/19 2118  GLUCAP 206*    Assessment/Plan: S/P Procedure(s) (LRB): VIDEO ASSISTED THORACOSCOPY (Right) MECHANICAL PLEURODESIS (Right) TALC PLEURADESIS (Right)  1. Chest tube- no air leak, CXR with stable trace lateral pneumothorax, this was present yesterday as well.. will try chest tube to water seal today 2. Dispo- patient stable, chest tube to water seal, repeat CXR in AM, care per primary   LOS: 4 days    Ellwood Handler, PA-C  08/25/2019   Pt seen and examined; chart/films reviewed. Agree with  documentation and plan for repeat CXR in am with CT to [facility]. Azlee Monforte Z. Orvan Seen, [place]

## 2019-08-25 NOTE — Progress Notes (Signed)
PROGRESS NOTE  Lauren Massey  DOB: 30-Aug-1955  PCP: Hoyt Koch, MD CVE:[id number]  DOA: 08/21/2019  LOS: 4 days   Chief Complaint  Patient presents with  . Shortness of Breath   Brief narrative: Lauren Massey is a 64 y.o. female with history of sarcoidosis who has had recurrent pneumothorax and underwent chemical pleurodesis last month. She followed up with pulmonologist on 7/28. She complained of shortness of breath, worsening cough, was found to be hypoxic.  Chest x-ray was obtained which showed right-sided pneumothorax and hence referred to the ER.   In the ED, patient required 2 L oxygen to maintain saturation. Chest x-ray confirmed right-sided 20 to 25% pneumothorax.   ER physician discussed with on-call cardiothoracic surgeon Dr. Kipp Brood. Labs showed hemoglobin of 11.7 WBC 3.9. Covid test negative  EKG showed normal sinus rhythm. Patient was admitted to hospitalist service.  7/30, patient underwent right VATS, mechanical pleurodesis and talc diuresis.  Subjective: Patient was seen and examined this morning.  Sitting up in chair.  Has chest tube in the right side. A1c 6.  Assessment/Plan: Acute respiratory failure with hypoxia Recurrent pneumothorax related to pulmonary sarcoidosis -Presented with worsening shortness of breath, cough, hypoxia.   -Chest x-ray showed right-sided 20-25% pneumothorax.   -Cardiothoracic consult appreciated. -7/30, patient underwent right VATS, mechanical pleurodesis and talc diuresis. -Right-sided chest tube remains on suction.  Chest management per CT surgery. -Continue pain control with oral oxycodone.  History of sarcoidosis  -continue methotrexate 7.5 mg weekly along with folic acid.  Hypertension  -Continue to monitor blood pressure on hydrochlorothiazide. -Repeat labs tomorrow.  Hyperglycemia -A1c 6 -Noted blood sugar level of 206 last night.   Mobility: Encourage ambulation Code Status:   Code Status: Full  Code  Nutritional status: Body mass index is 23.67 kg/m.     Diet Order            Diet regular Room service appropriate? Yes; Fluid consistency: Thin  Diet effective now                 DVT prophylaxis: enoxaparin (LOVENOX) injection 40 mg Start: 08/23/19 2200 SCD's Start: 08/23/19 1515 SCDs Start: 08/21/19 2142   Antimicrobials:  None Fluid: None  Consultants: Cardiothoracic surgery Family Communication:  Not at bedside  Status is: Inpatient  Remains inpatient appropriate because:Ongoing diagnostic testing needed not appropriate for outpatient work up and IV treatments appropriate due to intensity of illness or inability to take PO   Dispo: The patient is from: Home              Anticipated d/c is to: Home              Anticipated d/c date is: 3 days              Patient currently is not medically stable to d/c.   Infusions:  . sodium chloride      Scheduled Meds: . acetaminophen  1,000 mg Oral Q6H   Or  . acetaminophen (TYLENOL) oral liquid 160 mg/5 mL  1,000 mg Oral Q6H  . bisacodyl  10 mg Oral Daily  . enoxaparin (LOVENOX) injection  40 mg Subcutaneous Q24H  . folic acid  5 mg Oral QHS  . hydrochlorothiazide  12.5 mg Oral Daily  . methotrexate  7.5 mg Oral Weekly  . prednisoLONE acetate  1 drop Right Eye QID  . senna-docusate  1 tablet Oral QHS    Antimicrobials: Anti-infectives (From admission, onward)   Start  Dose/Rate Route Frequency Ordered Stop   08/23/19 1830  ceFAZolin (ANCEF) IVPB 2g/100 mL premix        2 g 200 mL/hr over 30 Minutes Intravenous Every 8 hours 08/23/19 1514 08/24/19 0623   08/22/19 2100  ceFAZolin (ANCEF) IVPB 2g/100 mL premix        2 g 200 mL/hr over 30 Minutes Intravenous 30 min pre-op 08/22/19 2054 08/23/19 1047      PRN meds: Place/Maintain arterial line **AND** sodium chloride, albuterol, diphenhydrAMINE **OR** diphenhydrAMINE, naloxone **AND** sodium chloride flush, ondansetron **OR** ondansetron (ZOFRAN) IV,  oxyCODONE, traMADol   Objective: Vitals:   08/25/19 1150 08/25/19 1221  BP: (!) 136/77   Pulse: (!) 25   Resp: 23   Temp:  98.7 F (37.1 C)  SpO2: 100%     Intake/Output Summary (Last 24 hours) at 08/25/2019 1343 Last data filed at 08/25/2019 1301 Gross per 24 hour  Intake 2329.3 ml  Output 674 ml  Net 1655.3 ml   Filed Weights   08/21/19 1826 08/21/19 2348 08/22/19 0000  Weight: 58.5 kg 58.7 kg 58.7 kg   Weight change:  Body mass index is 23.67 kg/m.   Physical Exam prior to procedure: General exam: Appears calm and comfortable.  Not in distress.  Pain controlled. Skin: No rashes, lesions or ulcers. HEENT: Atraumatic, normocephalic, supple neck, no obvious bleeding Lungs: Clear to auscultation on the left.  Chest tube in the right CVS: Regular rate and rhythm, no murmur GI/Abd soft, nontender, nondistended, bowel sound present CNS: Alert, awake, oriented x3 Psychiatry: Mood appropriate Extremities: No pedal edema, no calf tenderness  Data Review: I have personally reviewed the laboratory data and studies available.  Recent Labs  Lab 08/21/19 1844 08/22/19 0055 08/22/19 2106 08/24/19 0047 08/25/19 0129  WBC 3.9* 4.0 4.4 5.7 5.5  NEUTROABS 1.8  --   --   --   --   HGB 11.7* 11.1* 11.8* 11.0* 10.6*  HCT 38.1 35.7* 37.8 35.9* 34.7*  MCV 90.9 89.9 89.8 91.8 89.2  PLT 183 156 183 169 154   Recent Labs  Lab 08/21/19 1844 08/22/19 0055 08/22/19 2106 08/24/19 0047 08/25/19 0129  NA 137 137 135 134* 133*  K 3.6 3.7 3.9 4.1 3.8  CL 102 102 101 100 101  CO2 27 26 25 27 25   GLUCOSE 95 99 108* 119* 102*  BUN 21 21 15 12  6*  CREATININE 0.89 0.85 0.86 0.71 0.63  CALCIUM 9.4 9.2 9.5 9.1 8.5*   Lab Results  Component Value Date   HGBA1C 6.0 (H) 08/24/2019       Component Value Date/Time   CHOL 233 (A) 11/17/2018 0000   TRIG 106 11/17/2018 0000   HDL 77 (A) 11/17/2018 0000   CHOLHDL 4 02/05/2018 1010   VLDL 18.2 02/05/2018 1010   LDLCALC 18 11/17/2018 0000      Signed, Terrilee Croak, MD [facility] Pager: [phone] (Secure Chat preferred). 08/25/2019

## 2019-08-25 NOTE — Evaluation (Signed)
Physical Therapy Evaluation Patient Details Name: Lauren Massey MRN: [id number] DOB: January 31, 1955 Today's Date: 08/25/2019   History of Present Illness  Lauren Massey is a 64 y.o. female with history of sarcoidosis who has had recurrent pneumothorax and underwent chemical pleurodesis last month. Presents with shortness of breath, worsening cough, and found to be hypoxic. Chest x-ray showed right sided pneumothorax. On 7/30 pt underwent right VATS, mechanical pleurodesis and talc diuresis.   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. Pt presents with decreased functional mobility in setting of pain and decreased cardiopulmonary endurance. Desaturation to 86% on RA, returned to 1L O2 where she remained > 90%. Ambulating 400 feet at a supervision level. Will continue to follow acutely to promote mobility, but don't anticipate need for PT follow up.      Follow Up Recommendations No PT follow up    Equipment Recommendations  None recommended by PT    Recommendations for Other Services       Precautions / Restrictions Precautions Precautions: Other (comment) Precaution Comments: chest tube Restrictions Weight Bearing Restrictions: No      Mobility  Bed Mobility Overal bed mobility: Independent                Transfers Overall transfer level: Independent Equipment used: None                Ambulation/Gait Ambulation/Gait assistance: Supervision Gait Distance (Feet): 400 Feet Assistive device: Rolling walker (2 wheeled) Gait Pattern/deviations: Step-through pattern;Decreased stride length Gait velocity: decreased   General Gait Details: Slow pace, no gross imbalance, walker utilized for chest tube placement  Stairs            Wheelchair Mobility    Modified Rankin (Stroke Patients Only)       Balance Overall balance assessment: No apparent balance deficits (not  formally assessed)                                           Pertinent Vitals/Pain Pain Assessment: Faces Faces Pain Scale: Hurts even more Pain Location: chest tube site Pain Descriptors / Indicators: Grimacing;Guarding Pain Intervention(s): Monitored during session;Patient requesting pain meds-RN notified    Home Living Family/patient expects to be discharged to:: Private residence Living Arrangements: Spouse/significant other;Other relatives (30 y.o. grandson) Available Help at Discharge: Family Type of Home: Mobile home Home Access: Stairs to enter   Entrance Stairs-Number of Steps: 4 Home Layout: One level        Prior Function Level of Independence: Independent         Comments: Works in Microbiologist, has been on light duty     Journalist, newspaper        Extremity/Trunk Assessment   Upper Extremity Assessment Upper Extremity Assessment: Overall WFL for tasks assessed    Lower Extremity Assessment Lower Extremity Assessment: Overall WFL for tasks assessed    Cervical / Trunk Assessment Cervical / Trunk Assessment: Normal  Communication   Communication: No difficulties  Cognition Arousal/Alertness: Awake/alert Behavior During Therapy: WFL for tasks assessed/performed Overall Cognitive Status: Within Functional Limits for tasks assessed  General Comments      Exercises General Exercises - Lower Extremity Long Arc Quad: Both;10 reps;Seated   Assessment/Plan    PT Assessment Patient needs continued PT services  PT Problem List Decreased strength;Decreased activity tolerance;Decreased mobility;Pain       PT Treatment Interventions Gait training;Stair training;Functional mobility training;Therapeutic activities;Therapeutic exercise;Balance training;Patient/family education    PT Goals (Current goals can be found in the Care Plan section)  Acute Rehab PT  Goals Patient Stated Goal: less pain, not have to come back to hospital PT Goal Formulation: With patient Time For Goal Achievement: 09/08/19 Potential to Achieve Goals: Good    Frequency Min 3X/week   Barriers to discharge        Co-evaluation               AM-PAC PT "6 Clicks" Mobility  Outcome Measure Help needed turning from your back to your side while in a flat bed without using bedrails?: None Help needed moving from lying on your back to sitting on the side of a flat bed without using bedrails?: None Help needed moving to and from a bed to a chair (including a wheelchair)?: None Help needed standing up from a chair using your arms (e.g., wheelchair or bedside chair)?: None Help needed to walk in hospital room?: None Help needed climbing 3-5 steps with a railing? : A Little 6 Click Score: 23    End of Session Equipment Utilized During Treatment: Oxygen Activity Tolerance: Patient tolerated treatment well Patient left: in bed;with call bell/phone within reach Nurse Communication: Patient requests pain meds PT Visit Diagnosis: Pain;Difficulty in walking, not elsewhere classified (R26.2) Pain - part of body:  (flank)    Time: 1400-1431 PT Time Calculation (min) (ACUTE ONLY): 31 min   Charges:   PT Evaluation $PT Eval Moderate Complexity: 1 Mod PT Treatments $Therapeutic Activity: 8-22 mins          Wyona Almas, PT, DPT Acute Rehabilitation Services Pager [phone] Office [phone]   Deno Etienne 08/25/2019, 3:47 PM

## 2019-08-26 ENCOUNTER — Inpatient Hospital Stay (HOSPITAL_COMMUNITY): Payer: 59

## 2019-08-26 NOTE — Plan of Care (Signed)
  Problem: Education: Goal: Knowledge of General Education information will improve Description: Including pain rating scale, medication(s)/side effects and non-pharmacologic comfort measures Outcome: Progressing   Problem: Clinical Measurements: Goal: Will remain free from infection Outcome: Progressing Goal: Diagnostic test results will improve Outcome: Progressing   Problem: Cardiac: Goal: Will achieve and/or maintain hemodynamic stability Outcome: Progressing   Problem: Respiratory: Goal: Respiratory status will improve Outcome: Progressing   Problem: Pain Management: Goal: Pain level will decrease Outcome: Progressing

## 2019-08-26 NOTE — Progress Notes (Signed)
PROGRESS NOTE  Lauren Massey  DOB: 09-26-1955  PCP: Hoyt Koch, MD UVO:[id number]  DOA: 08/21/2019  LOS: 5 days   Chief Complaint  Patient presents with  . Shortness of Breath   Brief narrative: Lauren Massey is a 64 y.o. female with history of sarcoidosis who has had recurrent pneumothorax and underwent chemical pleurodesis last month. She followed up with pulmonologist on 7/28. She complained of shortness of breath, worsening cough, was found to be hypoxic.  Chest x-ray was obtained which showed right-sided pneumothorax and hence referred to the ER.   In the ED, patient required 2 L oxygen to maintain saturation. Chest x-ray confirmed right-sided 20 to 25% pneumothorax.   ER physician discussed with on-call cardiothoracic surgeon Dr. Kipp Brood. Labs showed hemoglobin of 11.7 WBC 3.9. Covid test negative  EKG showed normal sinus rhythm. Patient was admitted to hospitalist service.  7/30, patient underwent right VATS, mechanical pleurodesis and talc diuresis.  Subjective: Patient was seen and examined this morning.  Sitting up in chair.  Not in distress.  Chest tube remains in.  Assessment/Plan: Acute respiratory failure with hypoxia Recurrent pneumothorax related to pulmonary sarcoidosis -Presented with worsening shortness of breath, cough, hypoxia.   -Chest x-ray showed right-sided 20-25% pneumothorax.   -Cardiothoracic consult appreciated. -7/30, patient underwent right VATS, mechanical pleurodesis and talc diuresis. -Right-sided chest tube remains on suction.  Chest management per CT surgery. -Continue pain control with oral oxycodone.  History of sarcoidosis  -continue methotrexate 7.5 mg weekly along with folic acid.  Hypertension  -Continue to monitor blood pressure on hydrochlorothiazide. -Renal function is stable.  Sodium slowly trending down.  Repeat level tomorrow.  Hyperglycemia  -Intermittent.  A1c 6  Mobility: Encourage ambulation Code  Status:   Code Status: Full Code  Nutritional status: Body mass index is 23.67 kg/m.     Diet Order            Diet regular Room service appropriate? Yes; Fluid consistency: Thin  Diet effective now                 DVT prophylaxis: enoxaparin (LOVENOX) injection 40 mg Start: 08/23/19 2200 SCD's Start: 08/23/19 1515 SCDs Start: 08/21/19 2142   Antimicrobials:  None Fluid: None  Consultants: Cardiothoracic surgery Family Communication:  Not at bedside  Status is: Inpatient  Remains inpatient appropriate because:Ongoing diagnostic testing needed not appropriate for outpatient work up and IV treatments appropriate due to intensity of illness or inability to take PO   Dispo: The patient is from: Home              Anticipated d/c is to: Home              Anticipated d/c date is: 1 to 2 days.              Patient currently is not medically stable to d/c.   Infusions:  . sodium chloride      Scheduled Meds: . acetaminophen  1,000 mg Oral Q6H   Or  . acetaminophen (TYLENOL) oral liquid 160 mg/5 mL  1,000 mg Oral Q6H  . bisacodyl  10 mg Oral Daily  . enoxaparin (LOVENOX) injection  40 mg Subcutaneous Q24H  . folic acid  5 mg Oral QHS  . hydrochlorothiazide  12.5 mg Oral Daily  . methotrexate  7.5 mg Oral Weekly  . prednisoLONE acetate  1 drop Right Eye QID  . senna-docusate  1 tablet Oral QHS    Antimicrobials: Anti-infectives (From  admission, onward)   Start     Dose/Rate Route Frequency Ordered Stop   08/23/19 1830  ceFAZolin (ANCEF) IVPB 2g/100 mL premix        2 g 200 mL/hr over 30 Minutes Intravenous Every 8 hours 08/23/19 1514 08/24/19 0623   08/22/19 2100  ceFAZolin (ANCEF) IVPB 2g/100 mL premix        2 g 200 mL/hr over 30 Minutes Intravenous 30 min pre-op 08/22/19 2054 08/23/19 1047      PRN meds: Place/Maintain arterial line **AND** sodium chloride, albuterol, diphenhydrAMINE **OR** diphenhydrAMINE, naloxone **AND** sodium chloride flush, ondansetron  **OR** ondansetron (ZOFRAN) IV, oxyCODONE, traMADol   Objective: Vitals:   08/26/19 0410 08/26/19 0730  BP: (!) 129/76 (!) 136/81  Pulse: 58 69  Resp: 23 18  Temp: 97.7 F (36.5 C) 98 F (36.7 C)  SpO2: 100% 100%    Intake/Output Summary (Last 24 hours) at 08/26/2019 1105 Last data filed at 08/26/2019 0732 Gross per 24 hour  Intake --  Output 64 ml  Net -64 ml   Filed Weights   08/21/19 1826 08/21/19 2348 08/22/19 0000  Weight: 58.5 kg 58.7 kg 58.7 kg   Weight change:  Body mass index is 23.67 kg/m.   Physical Exam: General exam: Appears calm and comfortable.  Not in distress.  Pain controlled. Skin: No rashes, lesions or ulcers. HEENT: Atraumatic, normocephalic, supple neck, no obvious bleeding Lungs: Clear to auscultation on the left.  Chest tube remains on the right CVS: Regular rate and rhythm, no murmur GI/Abd soft, nontender, nondistended, bowel sound present CNS: Alert, awake, oriented x3 Psychiatry: Mood appropriate Extremities: No pedal edema, no calf tenderness  Data Review: I have personally reviewed the laboratory data and studies available.  Recent Labs  Lab 08/21/19 1844 08/22/19 0055 08/22/19 2106 08/24/19 0047 08/25/19 0129  WBC 3.9* 4.0 4.4 5.7 5.5  NEUTROABS 1.8  --   --   --   --   HGB 11.7* 11.1* 11.8* 11.0* 10.6*  HCT 38.1 35.7* 37.8 35.9* 34.7*  MCV 90.9 89.9 89.8 91.8 89.2  PLT 183 156 183 169 154   Recent Labs  Lab 08/21/19 1844 08/22/19 0055 08/22/19 2106 08/24/19 0047 08/25/19 0129  NA 137 137 135 134* 133*  K 3.6 3.7 3.9 4.1 3.8  CL 102 102 101 100 101  CO2 27 26 25 27 25   GLUCOSE 95 99 108* 119* 102*  BUN 21 21 15 12  6*  CREATININE 0.89 0.85 0.86 0.71 0.63  CALCIUM 9.4 9.2 9.5 9.1 8.5*   Lab Results  Component Value Date   HGBA1C 6.0 (H) 08/24/2019       Component Value Date/Time   CHOL 233 (A) 11/17/2018 0000   TRIG 106 11/17/2018 0000   HDL 77 (A) 11/17/2018 0000   CHOLHDL 4 02/05/2018 1010   VLDL 18.2  02/05/2018 1010   LDLCALC 18 11/17/2018 0000    Signed, Terrilee Croak, MD [facility] Pager: [phone] (Secure Chat preferred). 08/26/2019

## 2019-08-26 NOTE — Plan of Care (Signed)
  Problem: Education: Goal: Knowledge of General Education information will improve Description: Including pain rating scale, medication(s)/side effects and non-pharmacologic comfort measures Outcome: Progressing   Problem: Health Behavior/Discharge Planning: Goal: Ability to manage health-related needs will improve Outcome: Progressing   Problem: Clinical Measurements: Goal: Ability to maintain clinical measurements within normal limits will improve Outcome: Progressing Goal: Will remain free from infection Outcome: Progressing Goal: Diagnostic test results will improve Outcome: Progressing   Problem: Education: Goal: Knowledge of disease or condition will improve Outcome: Progressing Goal: Knowledge of the prescribed therapeutic regimen will improve Outcome: Progressing   Problem: Activity: Goal: Risk for activity intolerance will decrease Outcome: Progressing   Problem: Cardiac: Goal: Will achieve and/or maintain hemodynamic stability Outcome: Progressing   Problem: Clinical Measurements: Goal: Postoperative complications will be avoided or minimized Outcome: Progressing   Problem: Respiratory: Goal: Respiratory status will improve Outcome: Progressing   Problem: Pain Management: Goal: Pain level will decrease Outcome: Progressing   Problem: Skin Integrity: Goal: Wound healing without signs and symptoms infection will improve Outcome: Progressing

## 2019-08-26 NOTE — Progress Notes (Addendum)
      [address]       [place] [address]             [phone]      3 Days Post-Op Procedure(s) (LRB): VIDEO ASSISTED THORACOSCOPY (Right) MECHANICAL PLEURODESIS (Right) TALC PLEURADESIS (Right)   Subjective:  No new complaints.  She is sitting up in chair, feeling pretty good.  Objective: Vital signs in last 24 hours: Temp:  [97.7 F (36.5 C)-99.1 F (37.3 C)] 97.7 F (36.5 C) (08/02 0410) Pulse Rate:  [25-80] 58 (08/02 0410) Cardiac Rhythm: Normal sinus rhythm (08/01 2304) Resp:  [21-23] 23 (08/02 0410) BP: (110-155)/(73-83) 129/76 (08/02 0410) SpO2:  [96 %-100 %] 100 % (08/02 0410)  Intake/Output from previous day: 08/01 0701 - 08/02 0700 In: -  Out: 64 [Chest Tube:64]  General appearance: alert, cooperative and no distress Heart: regular rate and rhythm Lungs: clear to auscultation bilaterally Abdomen: soft, non-tender; bowel sounds normal; no masses,  no organomegaly Extremities: extremities normal, atraumatic, no cyanosis or edema Wound: clean and dry  Lab Results: Recent Labs    08/24/19 0047 08/25/19 0129  WBC 5.7 5.5  HGB 11.0* 10.6*  HCT 35.9* 34.7*  PLT 169 154   BMET:  Recent Labs    08/24/19 0047 08/25/19 0129  NA 134* 133*  K 4.1 3.8  CL 100 101  CO2 27 25  GLUCOSE 119* 102*  BUN 12 6*  CREATININE 0.71 0.63  CALCIUM 9.1 8.5*    PT/INR: No results for input(s): LABPROT, INR in the last 72 hours. ABG    Component Value Date/Time   PHART 7.418 08/22/2019 2108   HCO3 27.8 08/22/2019 2108   O2SAT 87.5 08/22/2019 2108   CBG (last 3)  Recent Labs    08/23/19 2118  GLUCAP 206*    Assessment/Plan: S/P Procedure(s) (LRB): VIDEO ASSISTED THORACOSCOPY (Right) MECHANICAL PLEURODESIS (Right) TALC PLEURADESIS (Right)  1. Chest tube- no air leak present, on water seal, CXR shows some improvement of lateral pneumothorax, will defer chest tube removal to staff 2. Dispo- patient stable, will review CXR with staff,  possibly d/c chest tube today, care per primary   LOS: 5 days    Ell[place] Handler, PA-C 08/26/2019   Chart reviewed, patient examined, agree with above. CXR shows a small lateral ptx that has been stable. I don't see any air leak with cough. I think the safest course is to clamp the tube overnight and repeat CXR in am to be sure lung stays inflated. If CXR unchanged can remove the tube in the am.

## 2019-08-27 ENCOUNTER — Inpatient Hospital Stay (HOSPITAL_COMMUNITY): Payer: 59

## 2019-08-27 NOTE — Progress Notes (Addendum)
      [address]       [place] [address]             [phone]      4 Days Post-Op Procedure(s) (LRB): VIDEO ASSISTED THORACOSCOPY (Right) MECHANICAL PLEURODESIS (Right) TALC PLEURADESIS (Right)   Subjective:  Having some pain this morning.  She otherwise is doing very well.  Objective: Vital signs in last 24 hours: Temp:  [98.1 F (36.7 C)-99.2 F (37.3 C)] 98.1 F (36.7 C) (08/03 0008) Pulse Rate:  [66-75] 69 (08/03 0400) Cardiac Rhythm: Normal sinus rhythm;Bundle branch block (08/03 0713) Resp:  [19-21] 21 (08/03 0400) BP: (106-166)/(68-94) 166/94 (08/03 0400) SpO2:  [99 %-100 %] 100 % (08/03 0400)  General appearance: alert, cooperative and no distress Heart: regular rate and rhythm Lungs: clear to auscultation bilaterally Abdomen: soft, non-tender; bowel sounds normal; no masses,  no organomegaly Extremities: extremities normal, atraumatic, no cyanosis or edema Wound: clean and dry  Lab Results: Recent Labs    08/25/19 0129  WBC 5.5  HGB 10.6*  HCT 34.7*  PLT 154   BMET:  Recent Labs    08/25/19 0129  NA 133*  K 3.8  CL 101  CO2 25  GLUCOSE 102*  BUN 6*  CREATININE 0.63  CALCIUM 8.5*    PT/INR: No results for input(s): LABPROT, INR in the last 72 hours. ABG    Component Value Date/Time   PHART 7.418 08/22/2019 2108   HCO3 27.8 08/22/2019 2108   O2SAT 87.5 08/22/2019 2108   CBG (last 3)  No results for input(s): GLUCAP in the last 72 hours.  Assessment/Plan: S/P Procedure(s) (LRB): VIDEO ASSISTED THORACOSCOPY (Right) MECHANICAL PLEURODESIS (Right) TALC PLEURADESIS (Right)  1. CV- NSR, + HTN, likely related to pain 2. Pulm- no air leak, CXR shows some improvement in pneumothorax, will d/c chest tube today 3. Dispo- patient stable, d/c chest tube today, will get review CXR in AM if stable can d/c home tomorrow, plan per primary   LOS: 6 days    [place] Handler, PA-C 08/27/2019   Chart reviewed, patient examined,  agree with above. CXR shows stable tiny right ptx. No air leak seen after tube unclamped. Will remove the tube and repeat CXR after tube out to be sure lung remains expanded and no air sucked in through the chest tube site. Then repeat in am and if stable send home.

## 2019-08-27 NOTE — Progress Notes (Signed)
PROGRESS NOTE  [name]  DOB: [birth year]  PCP: Hoyt Koch, MD PXT:[id number]  DOA: 08/21/2019  LOS: 6 days   Chief Complaint  Patient presents with  . Shortness of Breath   Brief narrative: Lauren Massey is a 64 y.o. female with history of sarcoidosis who has had recurrent pneumothorax and underwent chemical pleurodesis last month. She followed up with pulmonologist on 7/28. She complained of shortness of breath, worsening cough, was found to be hypoxic.  Chest x-ray was obtained which showed right-sided pneumothorax and hence referred to the ER.   In the ED, patient required 2 L oxygen to maintain saturation. Chest x-ray confirmed right-sided 20 to 25% pneumothorax.   ER physician discussed with on-call cardiothoracic surgeon Dr. Kipp Brood. Labs showed hemoglobin of 11.7 WBC 3.9. Covid test negative  EKG showed normal sinus rhythm. Patient was admitted to hospitalist service.  7/30, patient underwent right VATS, mechanical pleurodesis and talc diuresis.  Subjective: Patient was seen and examined this morning.  Sitting up in chair.  Seen walking in the hallway earlier. Noted a plan from CT surgery to take chest tube out today. Blood pressure seems elevated to 166/91 this morning.  Assessment/Plan: Acute respiratory failure with hypoxia Recurrent pneumothorax related to pulmonary sarcoidosis -Presented with worsening shortness of breath, cough, hypoxia.   -Chest x-ray showed right-sided 20-25% pneumothorax.   -Cardiothoracic consult appreciated. -7/30, patient underwent right VATS, mechanical pleurodesis and talc diuresis. -Right-sided chest tube remains on suction.  Chest management per CT surgery. -Continue pain control with oral oxycodone. -Currently not on supplemental oxygen at rest.  Continue to monitor.  History of sarcoidosis  -continue methotrexate 7.5 mg weekly along with folic acid.  Hypertension  -One episode of elevated blood pressure to  166/91 this morning.   -Continue to monitor blood pressure on hydrochlorothiazide. -Renal function is stable.   Hyperglycemia  -Intermittent.  A1c 6  Mobility: Encourage ambulation Code Status:   Code Status: Full Code  Nutritional status: Body mass index is 23.67 kg/m.     Diet Order            Diet regular Room service appropriate? Yes; Fluid consistency: Thin  Diet effective now                 DVT prophylaxis: enoxaparin (LOVENOX) injection 40 mg Start: 08/23/19 2200 SCD's Start: 08/23/19 1515 SCDs Start: 08/21/19 2142   Antimicrobials:  None Fluid: None  Consultants: Cardiothoracic surgery Family Communication:  Not at bedside  Status is: Inpatient  Remains inpatient appropriate because chest tube is in place.  Noted a plan to take it out today   Dispo: The patient is from: Home              Anticipated d/c is to: Home              Anticipated d/c date is: 1 day              Patient currently is not medically stable to d/c.  Hopefully home tomorrow.   Infusions:  . sodium chloride      Scheduled Meds: . acetaminophen  1,000 mg Oral Q6H   Or  . acetaminophen (TYLENOL) oral liquid 160 mg/5 mL  1,000 mg Oral Q6H  . bisacodyl  10 mg Oral Daily  . enoxaparin (LOVENOX) injection  40 mg Subcutaneous Q24H  . folic acid  5 mg Oral QHS  . hydrochlorothiazide  12.5 mg Oral Daily  . methotrexate  7.5 mg  Oral Weekly  . prednisoLONE acetate  1 drop Right Eye QID  . senna-docusate  1 tablet Oral QHS    Antimicrobials: Anti-infectives (From admission, onward)   Start     Dose/Rate Route Frequency Ordered Stop   08/23/19 1830  ceFAZolin (ANCEF) IVPB 2g/100 mL premix        2 g 200 mL/hr over 30 Minutes Intravenous Every 8 hours 08/23/19 1514 08/24/19 0623   08/22/19 2100  ceFAZolin (ANCEF) IVPB 2g/100 mL premix        2 g 200 mL/hr over 30 Minutes Intravenous 30 min pre-op 08/22/19 2054 08/23/19 1047      PRN meds: Place/Maintain arterial line **AND**  sodium chloride, albuterol, diphenhydrAMINE **OR** diphenhydrAMINE, naloxone **AND** sodium chloride flush, ondansetron **OR** ondansetron (ZOFRAN) IV, oxyCODONE, traMADol   Objective: Vitals:   08/27/19 0400 08/27/19 0806  BP: (!) 166/94 (!) 107/56  Pulse: 69 78  Resp: (!) 21 (!) 22  Temp:  98.5 F (36.9 C)  SpO2: 100% 100%    Intake/Output Summary (Last 24 hours) at 08/27/2019 1015 Last data filed at 08/27/2019 0413 Gross per 24 hour  Intake --  Output 0 ml  Net 0 ml   Filed Weights   08/21/19 1826 08/21/19 2348 08/22/19 0000  Weight: 58.5 kg 58.7 kg 58.7 kg   Weight change:  Body mass index is 23.67 kg/m.   Physical Exam: General exam: Appears calm and comfortable.  Not in distress.  Pain controlled. Skin: No rashes, lesions or ulcers. HEENT: Atraumatic, normocephalic, supple neck, no obvious bleeding Lungs: Clear to auscultation on the left.  Chest tube remains on the right side.  Lungs opening up.   CVS: Regular rate and rhythm, no murmur GI/Abd soft, nontender, nondistended, bowel sound present CNS: Alert, awake, oriented x3 Psychiatry: Mood appropriate Extremities: No pedal edema, no calf tenderness  Data Review: I have personally reviewed the laboratory data and studies available.  Recent Labs  Lab 08/21/19 1844 08/22/19 0055 08/22/19 2106 08/24/19 0047 08/25/19 0129  WBC 3.9* 4.0 4.4 5.7 5.5  NEUTROABS 1.8  --   --   --   --   HGB 11.7* 11.1* 11.8* 11.0* 10.6*  HCT 38.1 35.7* 37.8 35.9* 34.7*  MCV 90.9 89.9 89.8 91.8 89.2  PLT 183 156 183 169 154   Recent Labs  Lab 08/21/19 1844 08/22/19 0055 08/22/19 2106 08/24/19 0047 08/25/19 0129  NA 137 137 135 134* 133*  K 3.6 3.7 3.9 4.1 3.8  CL 102 102 101 100 101  CO2 27 26 25 27 25   GLUCOSE 95 99 108* 119* 102*  BUN 21 21 15 12  6*  CREATININE 0.89 0.85 0.86 0.71 0.63  CALCIUM 9.4 9.2 9.5 9.1 8.5*   Lab Results  Component Value Date   HGBA1C 6.0 (H) 08/24/2019       Component Value Date/Time    CHOL 233 (A) 11/17/2018 0000   TRIG 106 11/17/2018 0000   HDL 77 (A) 11/17/2018 0000   CHOLHDL 4 02/05/2018 1010   VLDL 18.2 02/05/2018 1010   LDLCALC 18 11/17/2018 0000    Signed, Terrilee Croak, MD [facility] Pager: [phone] (Secure Chat preferred). 08/27/2019

## 2019-08-27 NOTE — Plan of Care (Signed)
  Problem: Education: Goal: Knowledge of General Education information will improve Description: Including pain rating scale, medication(s)/side effects and non-pharmacologic comfort measures Outcome: Progressing   Problem: Health Behavior/Discharge Planning: Goal: Ability to manage health-related needs will improve Outcome: Progressing   Problem: Clinical Measurements: Goal: Diagnostic test results will improve Outcome: Progressing   Problem: Education: Goal: Knowledge of disease or condition will improve Outcome: Progressing Goal: Knowledge of the prescribed therapeutic regimen will improve Outcome: Progressing   Problem: Activity: Goal: Risk for activity intolerance will decrease Outcome: Progressing   Problem: Clinical Measurements: Goal: Postoperative complications will be avoided or minimized Outcome: Progressing   Problem: Respiratory: Goal: Respiratory status will improve Outcome: Progressing   Problem: Pain Management: Goal: Pain level will decrease Outcome: Progressing

## 2019-08-27 NOTE — Plan of Care (Signed)

## 2019-08-27 NOTE — Progress Notes (Signed)
Physical Therapy Treatment/ Discharge Patient Details Name: Lauren Massey MRN: [id number] DOB: 1955-06-21 Today's Date: 08/27/2019    History of Present Illness Lauren Massey is a 65 y.o. female with history of sarcoidosis who has had recurrent pneumothorax and underwent chemical pleurodesis last month. Presents with shortness of breath, worsening cough, and found to be hypoxic. Chest x-ray showed right sided pneumothorax. On 7/30 pt underwent right VATS, mechanical pleurodesis and talc diuresis.     PT Comments    Pt pleasant awaiting chest tube removal and reports she has been walking in room without assist. Pt able to perform hall ambulation without AD on RA without drop in sats below 93%. Pt reports feeling like she is near baseline and reports no concern with further mobility. Pt with rail for stairs and demonstrates sufficient endurance and strength to complete. No further acute therapy needs with pt aware and agreeable, will sign off.     Follow Up Recommendations  No PT follow up     Equipment Recommendations  None recommended by PT    Recommendations for Other Services       Precautions / Restrictions Precautions Precautions: Other (comment) Precaution Comments: chest tube    Mobility  Bed Mobility Overal bed mobility: Independent                Transfers Overall transfer level: Independent                  Ambulation/Gait Ambulation/Gait assistance: Independent Gait Distance (Feet): 300 Feet Assistive device: None Gait Pattern/deviations: WFL(Within Functional Limits)   Gait velocity interpretation: >4.37 ft/sec, indicative of normal walking speed General Gait Details: therapist carried CT but pt to have it removed soon, no LOB or SOB with SpO2 93-100% on RA throughout   Stairs             Wheelchair Mobility    Modified Rankin (Stroke Patients Only)       Balance Overall balance assessment: No apparent balance deficits (not  formally assessed)                                          Cognition Arousal/Alertness: Awake/alert Behavior During Therapy: WFL for tasks assessed/performed Overall Cognitive Status: Within Functional Limits for tasks assessed                                        Exercises      General Comments        Pertinent Vitals/Pain Faces Pain Scale: Hurts a little bit Pain Location: chest tube site Pain Descriptors / Indicators: Sore Pain Intervention(s): Limited activity within patient's tolerance;Repositioned    Home Living                      Prior Function            PT Goals (current goals can now be found in the care plan section) Progress towards PT goals: Goals met/education completed, patient discharged from PT    Frequency           PT Plan Current plan remains appropriate    Co-evaluation              AM-PAC PT "6 Clicks" Mobility   Outcome Measure  Help needed  turning from your back to your side while in a flat bed without using bedrails?: None Help needed moving from lying on your back to sitting on the side of a flat bed without using bedrails?: None Help needed moving to and from a bed to a chair (including a wheelchair)?: None Help needed standing up from a chair using your arms (e.g., wheelchair or bedside chair)?: None Help needed to walk in hospital room?: None Help needed climbing 3-5 steps with a railing? : None 6 Click Score: 24    End of Session   Activity Tolerance: Patient tolerated treatment well Patient left: with nursing/sitter in room;Other (comment) (in bathroom with RN in room and call bell beside her) Nurse Communication: Mobility status PT Visit Diagnosis: Other abnormalities of gait and mobility (R26.89)     Time: 1001-1013 PT Time Calculation (min) (ACUTE ONLY): 12 min  Charges:  $Gait Training: 8-22 mins                     [place], PT Acute Rehabilitation  Services Pager: [phone] Office: [place] 08/27/2019, 12:54 PM

## 2019-08-28 ENCOUNTER — Inpatient Hospital Stay (HOSPITAL_COMMUNITY): Payer: 59

## 2019-08-28 MED ORDER — OXYCODONE HCL 5 MG PO TABS: 5.0000 mg | ORAL_TABLET | ORAL | 0 refills | Status: DC | PRN

## 2019-08-28 NOTE — Discharge Summary (Signed)
Physician Discharge Summary  Lauren Massey TIW:[id number] DOB: 1955/02/17 DOA: 08/21/2019  PCP: Hoyt Koch, MD  Admit date: 08/21/2019 Discharge date: 08/28/2019  Admitted From: Home  Disposition: Home  Recommendations for Outpatient Follow-up:  1. Follow up with PCP in 1-2 weeks 2. Follow-up with cardiothoracic surgery as scheduled on 09/12/19 at 0830am chest x-ray at visit  [place]: No Equipment/Devices: None  Discharge Condition: Stable CODE STATUS: Full code Diet recommendation: Heart healthy diet  History of present illness:  Lauren C Boulwareis a 64 y.o.femalewithhistory of sarcoidosis who has had recurrent pneumothorax and underwent chemical pleurodesis last month. She followed up with pulmonologist on 7/28. She complained of shortness of breath, worsening cough, was found to be hypoxic.  Chest x-ray was obtained which showed right-sided pneumothorax and hence referred to the ER.   In the ED, patient required 2 L oxygen to maintain saturation. Chest x-ray confirmed right-sided 20 to 25% pneumothorax. ER physician discussed with on-call cardiothoracic surgeon Dr. Kipp Brood. Labs showed hemoglobin of 11.7 WBC 3.9. Covid test negative. EKG showed normal sinus rhythm. Patient was admitted to hospitalist service.  Hospital course:  Acute respiratory failure with hypoxia Recurrent pneumothorax related to pulmonary sarcoidosis Patient presenting with progressive shortness of breath, cough and was found to be hypoxic. Chest x-ray notable for right-sided pneumothorax encompassing 20-25%. Cardiothoracic surgery was consulted and patient underwent right VATS with mechanical and talc pleurodesis on 08/23/2019. On 08/27/2019. PA chest x-ray on 08/28/2019 shows a stable small apical pneumothorax and CT surgery recommends discharge home with outpatient follow-up with repeat chest x-ray.  History of sarcoidosis  Continue methotrexate 7.5 mg weekly along with folic  acid.  Hypertension  Continue home HCTZ 12.5 mg p.o. daily  Discharge Diagnoses:  Principal Problem:   Acute respiratory failure with hypoxia (HCC) Active Problems:   SARCOIDOSIS, PULMONARY   Pneumothorax   Pneumothorax on right   Essential hypertension    Discharge Instructions  Discharge Instructions    Call MD for:  difficulty breathing, headache or visual disturbances   Complete by: As directed    Call MD for:  extreme fatigue   Complete by: As directed    Call MD for:  persistant dizziness or light-headedness   Complete by: As directed    Call MD for:  persistant nausea and vomiting   Complete by: As directed    Call MD for:  temperature >100.4   Complete by: As directed    Diet - low sodium heart healthy   Complete by: As directed    Increase activity slowly   Complete by: As directed    No wound care   Complete by: As directed      Allergies as of 08/28/2019   No Known Allergies     Medication List    TAKE these medications   albuterol 108 (90 Base) MCG/ACT inhaler Commonly known as: VENTOLIN HFA Inhale 2 puffs into the lungs every 6 (six) hours as needed for wheezing or shortness of breath.   folic acid 1 MG tablet Commonly known as: FOLVITE Take 5 mg by mouth at bedtime.   hydrochlorothiazide 12.5 MG capsule Commonly known as: MICROZIDE Take 1 capsule (12.5 mg total) by mouth daily.   methotrexate 2.5 MG tablet Commonly known as: RHEUMATREX Take 7.5 mg by mouth once a week. Caution:Chemotherapy. Protect from light.  Pt takes 3 tablets every Wednesday.   oxyCODONE 5 MG immediate release tablet Commonly known as: Oxy IR/ROXICODONE Take 1 tablet (5 mg total) by  mouth every 4 (four) hours as needed for severe pain.   prednisoLONE acetate 1 % ophthalmic suspension Commonly known as: PRED FORTE Place 1 drop into the right eye 2 (two) times daily. Reported on 04/16/2015 What changed: when to take this       Follow-up Information    Grace Isaac, MD. Go on 09/12/2019.   Specialty: Cardiothoracic Surgery Why: PA/LAT CXR to be taken (at [facility] which is in the same building as Dr. Everrett Coombe office) on 08/19 at 8:30 am;Appointment time is at 9:00 am Contact information: [place] [address] [phone]        Hoyt Koch, MD. Schedule an appointment as soon as possible for a visit in 1 week(s).   Specialty: Internal Medicine Contact information: [place] [address] [phone]        Donato Heinz, MD .   Specialties: Cardiology, Radiology Contact information: [address] [place] [address] [phone]              No Known Allergies  Consultations:  Cardiothoracic surgery   Procedures/Studies: DG Chest 2 View  Result Date: 08/28/2019 CLINICAL DATA:  Sarcoidosis.  Right-sided pneumothorax. EXAM: CHEST - 2 VIEW COMPARISON:  August 27, 2019 FINDINGS: There is loculated effusion along the lateral right base. Trace pneumothorax in this area remains. No tension component. Fibrosis throughout the lungs bilaterally is again noted, consistent with history of sarcoidosis. There is no frank edema or consolidation. Heart size and pulmonary vascularity are within normal limits. Fullness in the right hilum may represent a degree of adenopathy in this area, which could be consistent with history of sarcoidosis. No bone lesions. IMPRESSION: Trace pneumothorax with mild loculated effusion in the lateral right base currently. Underlying fibrosis and questionable mild right hilar adenopathy, findings indicative of chronic sarcoidosis. No frank edema or consolidation. Stable cardiac silhouette. Aortic Atherosclerosis (ICD10-I70.0). Electronically Signed   By: Lowella Grip III M.D.   On: 08/28/2019 08:04   DG Chest 2 View  Result Date: 08/21/2019 CLINICAL DATA:  Cough and shortness of breath. Interstitial lung disease.  EXAM: CHEST - 2 VIEW COMPARISON:  08/07/2019 FINDINGS: Heart size is normal. Advanced chronic interstitial lung disease with pulmonary scarring. Acute pneumothorax on the right estimated at 20-25%. No evidence of tension. Lung markings are in general slightly more prominent, which could go along with some acute inflammation. No dense consolidation or lobar collapse. IMPRESSION: Chronic lung disease. Pneumothorax on the right estimated at 20-25%. Increase pulmonary markings could possibly indicate some acute inflammation superimposed upon the chronic lung disease. Call report in progress. Electronically Signed   By: Nelson Chimes M.D.   On: 08/21/2019 17:29   DG Chest 2 View  Result Date: 08/07/2019 CLINICAL DATA:  64 year old female with history of shortness of breath. History of right-sided pneumothorax on 07/16/2019. EXAM: CHEST - 2 VIEW COMPARISON:  Chest x-ray 07/23/2019. FINDINGS: Lung volumes are low and there are diffuse reticulonodular opacities throughout the lungs bilaterally, similar to prior examinations, compatible with underlying pulmonary fibrosis and widespread bronchiectasis. No pneumothorax. No acute consolidative airspace disease. No pleural effusions. No evidence of pulmonary edema. Heart size is normal. Upper mediastinal contours are within normal limits. Aortic atherosclerosis. IMPRESSION: 1. No evidence of recurrent pneumothorax. 2. Advanced fibrotic changes in the lungs redemonstrated, as above. 3. Aortic atherosclerosis. Electronically Signed   By: Vinnie Langton M.D.   On: 08/07/2019 17:09   CT CHEST  WO CONTRAST  Result Date: 08/22/2019 CLINICAL DATA:  Acute respiratory failure with hypoxia. Known pneumothorax. EXAM: CT CHEST WITHOUT CONTRAST TECHNIQUE: Multidetector CT imaging of the chest was performed following the standard protocol without IV contrast. COMPARISON:  The graft performed 08/21/2019 and CT chest dated 03/07/2019. FINDINGS: Cardiovascular: The heart remains  enlarged. No pericardial effusion. The pulmonary artery is enlarged, measuring 3.7 cm in diameter, unchanged since prior exam. Mitral valve annulus calcifications are redemonstrated. Vascular calcifications are seen in the coronary arteries and aortic arch. Mediastinum/Nodes: Calcified mediastinal, hilar, and retrocrural lymph nodes are redemonstrated and consistent with granulomatous disease. Thyroid gland, trachea, and esophagus demonstrate no significant findings. Lungs/Pleura: There is a moderate sized right pneumothorax. There is no left pneumothorax. There is no pleural effusion on either side. Severe underlying chronic lung disease related to sarcoidosis with bronchiectasis and architectural distortion is not significantly changed. Pulmonary cystic changes are greatest at the lung bases. Few scattered calcified granuloma are redemonstrated. Upper Abdomen: A gallstone is partially imaged. Musculoskeletal: Areas of a relative sclerosis are seen throughout the spine likely reflect skeletal manifestations of sarcoidosis and are not significantly changed since 03/07/2019. IMPRESSION: 1. Moderate sized right pneumothorax. 2. Severe underlying chronic lung disease related to sarcoidosis. 3. Enlarged pulmonary artery, consistent with pulmonary hypertension. Aortic Atherosclerosis (ICD10-I70.0). Electronically Signed   By: Zerita Boers M.D.   On: 08/22/2019 14:57   DG CHEST PORT 1 VIEW  Result Date: 08/27/2019 CLINICAL DATA:  Status post chest tube removal. EXAM: PORTABLE CHEST 1 VIEW COMPARISON:  Same day. FINDINGS: Stable cardiomediastinal silhouette. Right-sided chest tube has been removed. Stable small right basilar pneumothorax is noted. Stable bilateral lung opacities are noted concerning for chronic interstitial lung disease. Bony thorax is unremarkable. IMPRESSION: Stable small right basilar pneumothorax status post chest tube removal. Stable bilateral lung opacities concerning for chronic interstitial  lung disease. Aortic Atherosclerosis (ICD10-I70.0). Electronically Signed   By: Marijo Conception M.D.   On: 08/27/2019 10:31   DG CHEST PORT 1 VIEW  Result Date: 08/27/2019 CLINICAL DATA:  Chest tube.  Pneumothorax. EXAM: PORTABLE CHEST 1 VIEW COMPARISON:  08/26/2019.  07/02/2018. FINDINGS: Right chest tube in stable position. Stable small right pneumothorax again noted. Chronic interstitial changes again noted without interim change. Stable cardiomegaly. Mild subcutaneous emphysema right chest. IMPRESSION: 1. Right chest tube in stable position. Stable small right pneumothorax again noted. 2.  Chronic interstitial changes again noted. Electronically Signed   By: Marcello Moores  Register   On: 08/27/2019 07:57   DG CHEST PORT 1 VIEW  Result Date: 08/26/2019 CLINICAL DATA:  Pneumothorax EXAM: PORTABLE CHEST 1 VIEW COMPARISON:  August 25, 2019 chest radiograph and chest CT August 22, 2019 FINDINGS: Stable chest tube position on the right with stable lateral and inferolateral right pneumothorax. No tension component. There is underlying scarring and fibrosis throughout the lungs, stable. No appreciable new opacity. Heart size and pulmonary vascularity are normal. No adenopathy. There is aortic atherosclerosis. No bone lesions. IMPRESSION: Stable lateral and inferolateral pneumothorax on the right without tension component. Stable chest tube position. Fibrosis and scarring bilaterally are stable. No new opacity. Stable cardiac silhouette. Aortic Atherosclerosis (ICD10-I70.0). Electronically Signed   By: Lowella Grip III M.D.   On: 08/26/2019 08:11   DG CHEST PORT 1 VIEW  Result Date: 08/25/2019 CLINICAL DATA:  Shortness of breath. Current history of sarcoidosis. Follow-up RIGHT pneumothorax with chest tube in place. EXAM: PORTABLE CHEST 1 VIEW COMPARISON:  08/24/2019 and earlier, including CT chest 08/22/2019. FINDINGS: The  loculated RIGHT LATERAL pneumothorax is unchanged since yesterday, though the pneumothorax has  significantly improved since the patient's presentation on 08/21/2019. The scarring and severe bullous changes in both lungs related to end-stage sarcoidosis are unchanged. No new pulmonary parenchymal abnormalities. Small RIGHT pleural effusion again noted. IMPRESSION: 1. Stable loculated RIGHT LATERAL pneumothorax since yesterday, though the pneumothorax has significantly improved since the patient's presentation on 08/21/2019. 2. Stable small RIGHT pleural effusion. 3. Stable chronic changes of end-stage sarcoidosis. 4. No new abnormalities. Electronically Signed   By: Evangeline Dakin M.D.   On: 08/25/2019 09:35   DG CHEST PORT 1 VIEW  Result Date: 08/24/2019 CLINICAL DATA:  Pneumothorax EXAM: PORTABLE CHEST 1 VIEW COMPARISON:  August 23, 2019 FINDINGS: Trachea midline.  Cardiomediastinal contours are stable. Coarse interstitial opacities are seen bilaterally along with scattered alveolar changes. Background pulmonary emphysema and chronic interstitial lung disease with similar appearance compared to recent chest CT. RIGHT-sided chest tube remains in place with persistent lucency along the RIGHT lateral chest, perhaps slightly increased when compared to the prior study. Approximately 1 cm along the RIGHT lateral chest wall. On limited assessment visualized skeletal structures without acute process. IMPRESSION: 1. Very small residual pneumothorax with RIGHT-sided chest tube in place. Differences in projection could account for is lack of visibility on previous imaging. Attention on follow-up. 2. Stable appearance of the lungs with chronic interstitial lung disease and pulmonary emphysema. Difficult to exclude superimposed infection in the current setting. These results will be called to the ordering clinician or representative by the Radiologist Assistant, and communication documented in the PACS or [employer]. Electronically Signed   By: Zetta Bills M.D.   On: 08/24/2019 12:25   DG Chest Port 1  View  Result Date: 08/23/2019 CLINICAL DATA:  Pneumothorax. EXAM: PORTABLE CHEST 1 VIEW COMPARISON:  August 21, 2019. FINDINGS: Stable cardiomediastinal silhouette. Interval placement of right-sided chest tube. No definite pneumothorax is noted. Stable bilateral lung opacities are noted concerning for chronic interstitial lung disease and scarring. Bony thorax is unremarkable. IMPRESSION: Interval placement of right-sided chest tube. No definite pneumothorax is noted. Stable bilateral lung opacities are noted concerning for chronic interstitial lung disease and scarring. Aortic Atherosclerosis (ICD10-I70.0). Electronically Signed   By: Marijo Conception M.D.   On: 08/23/2019 13:54      Subjective: Patient seen and examined at bedside, resting comfortably. Denies any further shortness of breath or cough. Chest tube removed yesterday, okay per cardiothoracic surgery for discharge home today with outpatient follow-up. Patient without any complaints or concerns at this time. Denies headache, no fever/chills/night sweats, no nausea/vomiting/diarrhea, no chest pain, palpitations, no shortness of breath, no abdominal pain, no weakness, no fatigue, no paresthesias. No acute events overnight per nursing staff.  Discharge Exam: Vitals:   08/28/19 0411 08/28/19 0809  BP: 132/76   Pulse: 67 69  Resp: 14   Temp: 98.5 F (36.9 C) 98.7 F (37.1 C)  SpO2: 100% 100%   Vitals:   08/27/19 2014 08/28/19 0027 08/28/19 0411 08/28/19 0809  BP: 120/83 128/72 132/76   Pulse: 62 70 67 69  Resp: 20 (!) 21 14   Temp: 98.6 F (37 C) 98 F (36.7 C) 98.5 F (36.9 C) 98.7 F (37.1 C)  TempSrc: Oral Oral Oral Oral  SpO2: 100% 100% 100% 100%  Weight:      Height:        General: Pt is alert, awake, not in acute distress Cardiovascular: RRR, S1/S2 +, no rubs, no gallops Respiratory:  CTA bilaterally, no wheezing, no rhonchi Abdominal: Soft, NT, ND, bowel sounds + Extremities: no edema, no cyanosis    The  results of significant diagnostics from this hospitalization (including imaging, microbiology, ancillary and laboratory) are listed below for reference.     Microbiology: Recent Results (from the past 240 hour(s))  SARS Coronavirus 2 by RT PCR (hospital order, performed in [facility] hospital lab) Nasopharyngeal Nasopharyngeal Swab     Status: None   Collection Time: 08/21/19  7:00 PM   Specimen: Nasopharyngeal Swab  Result Value Ref Range Status   SARS Coronavirus 2 NEGATIVE NEGATIVE Final    Comment: (NOTE) SARS-CoV-2 target nucleic acids are NOT DETECTED.  The SARS-CoV-2 RNA is generally detectable in upper and lower respiratory specimens during the acute phase of infection. The lowest concentration of SARS-CoV-2 viral copies this assay can detect is 250 copies / mL. A negative result does not preclude SARS-CoV-2 infection and should not be used as the sole basis for treatment or other patient management decisions.  A negative result may occur with improper specimen collection / handling, submission of specimen other than nasopharyngeal swab, presence of viral mutation(s) within the areas targeted by this assay, and inadequate number of viral copies (<250 copies / mL). A negative result must be combined with clinical observations, patient history, and epidemiological information.  Fact Sheet for Patients:   StrictlyIdeas.no  Fact Sheet for Healthcare Providers: BankingDealers.co.za  This test is not yet approved or  cleared by the [place] FDA and has been authorized for detection and/or diagnosis of SARS-CoV-2 by FDA under an Emergency Use Authorization (EUA).  This EUA will remain in effect (meaning this test can be used) for the duration of the COVID-19 declaration under Section 564(b)(1) of the Act, 21 U.S.C. section 360bbb-3(b)(1), unless the authorization is terminated or revoked sooner.  Performed at [facility], [place] [address], [place], [place] [address]      Labs: BNP (last 3 results) Recent Labs    03/08/19 1341  BNP 277.8*   Basic Metabolic Panel: Recent Labs  Lab 08/21/19 1844 08/22/19 0055 08/22/19 2106 08/24/19 0047 08/25/19 0129  NA 137 137 135 134* 133*  K 3.6 3.7 3.9 4.1 3.8  CL 102 102 101 100 101  CO2 27 26 25 27 25   GLUCOSE 95 99 108* 119* 102*  BUN 21 21 15 12  6*  CREATININE 0.89 0.85 0.86 0.71 0.63  CALCIUM 9.4 9.2 9.5 9.1 8.5*   Liver Function Tests: Recent Labs  Lab 08/21/19 1844 08/22/19 2106 08/25/19 0129  AST 26 27 45*  ALT 20 21 30   ALKPHOS 62 63 51  BILITOT 0.5 0.4 0.5  PROT 9.0* 9.2* 7.9  ALBUMIN 3.2* 3.1* 2.6*   No results for input(s): LIPASE, AMYLASE in the last 168 hours. No results for input(s): AMMONIA in the last 168 hours. CBC: Recent Labs  Lab 08/21/19 1844 08/22/19 0055 08/22/19 2106 08/24/19 0047 08/25/19 0129  WBC 3.9* 4.0 4.4 5.7 5.5  NEUTROABS 1.8  --   --   --   --   HGB 11.7* 11.1* 11.8* 11.0* 10.6*  HCT 38.1 35.7* 37.8 35.9* 34.7*  MCV 90.9 89.9 89.8 91.8 89.2  PLT 183 156 183 169 154   Cardiac Enzymes: No results for input(s): CKTOTAL, CKMB, CKMBINDEX, TROPONINI in the last 168 hours. BNP: Invalid input(s): POCBNP CBG: Recent Labs  Lab 08/23/19 2118  GLUCAP 206*   D-Dimer No results for input(s): DDIMER in the last  72 hours. Hgb A1c No results for input(s): HGBA1C in the last 72 hours. Lipid Profile No results for input(s): CHOL, HDL, LDLCALC, TRIG, CHOLHDL, LDLDIRECT in the last 72 hours. Thyroid function studies No results for input(s): TSH, T4TOTAL, T3FREE, THYROIDAB in the last 72 hours.  Invalid input(s): FREET3 Anemia work up No results for input(s): VITAMINB12, FOLATE, FERRITIN, TIBC, IRON, RETICCTPCT in the last 72 hours. Urinalysis    Component Value Date/Time   COLORURINE YELLOW 08/22/2019 2225   APPEARANCEUR CLEAR 08/22/2019 2225   LABSPEC 1.023 08/22/2019 2225   PHURINE 5.0  08/22/2019 2225   GLUCOSEU NEGATIVE 08/22/2019 2225   HGBUR SMALL (A) 08/22/2019 2225   BILIRUBINUR NEGATIVE 08/22/2019 2225   BILIRUBINUR negative 11/02/2017 [place] 08/22/2019 2225   PROTEINUR NEGATIVE 08/22/2019 2225   UROBILINOGEN 0.2 11/02/2017 1047   NITRITE NEGATIVE 08/22/2019 2225   LEUKOCYTESUR NEGATIVE 08/22/2019 2225   Sepsis Labs Invalid input(s): PROCALCITONIN,  WBC,  LACTICIDVEN Microbiology Recent Results (from the past 240 hour(s))  SARS Coronavirus 2 by RT PCR (hospital order, performed in [facility] hospital lab) Nasopharyngeal Nasopharyngeal Swab     Status: None   Collection Time: 08/21/19  7:00 PM   Specimen: Nasopharyngeal Swab  Result Value Ref Range Status   SARS Coronavirus 2 NEGATIVE NEGATIVE Final    Comment: (NOTE) SARS-CoV-2 target nucleic acids are NOT DETECTED.  The SARS-CoV-2 RNA is generally detectable in upper and lower respiratory specimens during the acute phase of infection. The lowest concentration of SARS-CoV-2 viral copies this assay can detect is 250 copies / mL. A negative result does not preclude SARS-CoV-2 infection and should not be used as the sole basis for treatment or other patient management decisions.  A negative result may occur with improper specimen collection / handling, submission of specimen other than nasopharyngeal swab, presence of viral mutation(s) within the areas targeted by this assay, and inadequate number of viral copies (<250 copies / mL). A negative result must be combined with clinical observations, patient history, and epidemiological information.  Fact Sheet for Patients:   StrictlyIdeas.no  Fact Sheet for Healthcare Providers: BankingDealers.co.za  This test is not yet approved or  cleared by the [place] FDA and has been authorized for detection and/or diagnosis of SARS-CoV-2 by FDA under an Emergency Use Authorization (EUA).  This  EUA will remain in effect (meaning this test can be used) for the duration of the COVID-19 declaration under Section 564(b)(1) of the Act, 21 U.S.C. section 360bbb-3(b)(1), unless the authorization is terminated or revoked sooner.  Performed at [facility], [place] [address], [place], [place] [address]      Time coordinating discharge: Over 30 minutes  SIGNED:   Huck Ashworth J [place] DO  [facility] 08/28/2019, 8:45 AM

## 2019-08-28 NOTE — Progress Notes (Addendum)
      [address]       [place] [address]             [phone]      5 Days Post-Op Procedure(s) (LRB): VIDEO ASSISTED THORACOSCOPY (Right) MECHANICAL PLEURODESIS (Right) TALC PLEURADESIS (Right)   Subjective:  No new complaints.  Continues to have pain at chest tube site.    Objective: Vital signs in last 24 hours: Temp:  [98 F (36.7 C)-98.6 F (37 C)] 98.5 F (36.9 C) (08/04 0411) Pulse Rate:  [62-78] 67 (08/04 0411) Cardiac Rhythm: Normal sinus rhythm (08/04 0712) Resp:  [14-22] 14 (08/04 0411) BP: (107-140)/(56-83) 132/76 (08/04 0411) SpO2:  [99 %-100 %] 100 % (08/04 0411)  Intake/Output from previous day: 08/03 0701 - 08/04 0700 In: 720 [P.O.:720] Out: -   General appearance: alert, cooperative and no distress Heart: regular rate and rhythm Lungs: clear to auscultation bilaterally Abdomen: soft, non-tender; bowel sounds normal; no masses,  no organomegaly Extremities: extremities normal, atraumatic, no cyanosis or edema Wound: clean and dry  Lab Results: No results for input(s): WBC, HGB, HCT, PLT in the last 72 hours. BMET: No results for input(s): NA, K, CL, CO2, GLUCOSE, BUN, CREATININE, CALCIUM in the last 72 hours.  PT/INR: No results for input(s): LABPROT, INR in the last 72 hours. ABG    Component Value Date/Time   PHART 7.418 08/22/2019 2108   HCO3 27.8 08/22/2019 2108   O2SAT 87.5 08/22/2019 2108   CBG (last 3)  No results for input(s): GLUCAP in the last 72 hours.  Assessment/Plan: S/P Procedure(s) (LRB): VIDEO ASSISTED THORACOSCOPY (Right) MECHANICAL PLEURODESIS (Right) TALC PLEURADESIS (Right)  1. CV- NSR, BP stable 2. Pulm- chest tube removed yesterday, stable appearance of small apical pneumothorax 3. Dispo- patient stable, CXR is stable, I have sent a prescription for pain medication to the patient's pharmacy, okay to discharge home from surgical standpoint   LOS: 7 days    Ellwood Handler, PA-C 08/28/2019    Chart reviewed, patient examined, agree with above. CXR looks good with no significant ptx. She can go home and followup with [facility] as outpt with CXR.

## 2019-08-28 NOTE — Plan of Care (Signed)
°  Problem: Education: Goal: Knowledge of General Education information will improve Description: Including pain rating scale, medication(s)/side effects and non-pharmacologic comfort measures Outcome: Progressing   Problem: Health Behavior/Discharge Planning: Goal: Ability to manage health-related needs will improve Outcome: Progressing   Problem: Clinical Measurements: Goal: Ability to maintain clinical measurements within normal limits will improve Outcome: Progressing Goal: Will remain free from infection Outcome: Progressing Goal: Diagnostic test results will improve Outcome: Progressing   Problem: Education: Goal: Knowledge of disease or condition will improve Outcome: Progressing Goal: Knowledge of the prescribed therapeutic regimen will improve Outcome: Progressing

## 2019-08-29 ENCOUNTER — Telehealth: Payer: Self-pay | Admitting: Acute Care

## 2019-08-29 NOTE — Telephone Encounter (Signed)
Patient checking on disability forms.

## 2019-08-30 NOTE — Telephone Encounter (Signed)
To date I have not received any forms on this patient. Called and left message on pt vm to call back -pr

## 2019-09-02 ENCOUNTER — Other Ambulatory Visit: Payer: Self-pay | Admitting: Cardiothoracic Surgery

## 2019-09-02 DIAGNOSIS — J939 Pneumothorax, unspecified: Secondary | ICD-10-CM

## 2019-09-03 ENCOUNTER — Ambulatory Visit
Admission: RE | Admit: 2019-09-03 | Discharge: 2019-09-03 | Disposition: A | Discharge status: Home / Self Care | Payer: 59 | Source: Ambulatory Visit | Attending: Cardiothoracic Surgery | Admitting: Cardiothoracic Surgery

## 2019-09-03 ENCOUNTER — Ambulatory Visit (INDEPENDENT_AMBULATORY_CARE_PROVIDER_SITE_OTHER): Payer: Self-pay | Admitting: Cardiothoracic Surgery

## 2019-09-03 ENCOUNTER — Other Ambulatory Visit: Payer: Self-pay

## 2019-09-03 ENCOUNTER — Encounter: Payer: Self-pay | Admitting: Cardiothoracic Surgery

## 2019-09-03 ENCOUNTER — Telehealth: Payer: Self-pay | Admitting: Acute Care

## 2019-09-03 VITALS — BP 141/88 | HR 86 | Temp 97.6°F | Resp 16 | Ht 62.0 in | Wt 129.6 lb

## 2019-09-03 DIAGNOSIS — J939 Pneumothorax, unspecified: Secondary | ICD-10-CM

## 2019-09-03 DIAGNOSIS — D860 Sarcoidosis of lung: Secondary | ICD-10-CM

## 2019-09-03 DIAGNOSIS — Z09 Encounter for follow-up examination after completed treatment for conditions other than malignant neoplasm: Secondary | ICD-10-CM

## 2019-09-03 NOTE — Telephone Encounter (Signed)
Patient called back - advised paperwork still not received - got phone number for disability company [phone], claim number [id number] - I will call them to see if they can re-fax forms - pr

## 2019-09-03 NOTE — Progress Notes (Signed)
[address]       [place] [address]             [phone]      Lashanta C Worsley [place] Medical Record #[id number] Date of Birth: [birth year]  Referring: Rise Patience, MD Primary Care: Hoyt Koch, MD Primary Cardiologist: Donato Heinz, MD   Chief Complaint:   POST OP FOLLOW UP OPERATIVE REPORT DATE OF PROCEDURE:  08/23/2019 PREOPERATIVE DIAGNOSIS:  Recurrent right pneumothorax with underlying sarcoid. POSTOPERATIVE DIAGNOSIS:  Recurrent right pneumothorax with underlying sarcoid. SURGICAL PROCEDURE:   1.  Redo right video-assisted thoracoscopy with mechanical pleurodesis and talc pleurodesis.  2.  Placement of right chest tube.  History of Present Illness:     Patient returns to the office today with a follow-up chest x-ray after recent redo right video-assisted thoracoscopy with mechanical and talc pleurodesis for recurrent pneumothorax on the right.  She had previously undergone robotic assisted apical pleurectomy and pleurodesis in February 2021.  Since discharge home she is been doing well, she remains on her home oxygen, but she has been relatively comfortable in usual activities around the house.      Past Medical History:  Diagnosis Date  . Cataract    right eye  . Pneumothorax   . Sarcoidosis    skin, eye, lung  . Unspecified iridocyclitis      Social History   Tobacco Use  Smoking Status Never Smoker  Smokeless Tobacco Never Used    Social History   Substance and Sexual Activity  Alcohol Use No     No Known Allergies  Current Outpatient Medications  Medication Sig Dispense Refill  . albuterol (VENTOLIN HFA) 108 (90 Base) MCG/ACT inhaler Inhale 2 puffs into the lungs every 6 (six) hours as needed for wheezing or shortness of breath. 18 g 12  . folic acid (FOLVITE) 1 MG tablet Take 5 mg by mouth at bedtime.     . hydrochlorothiazide (MICROZIDE) 12.5 MG capsule Take 1 capsule (12.5 mg total) by  mouth daily. 90 capsule 3  . methotrexate (RHEUMATREX) 2.5 MG tablet Take 7.5 mg by mouth once a week. Caution:Chemotherapy. Protect from light.  Pt takes 3 tablets every Wednesday.    . prednisoLONE acetate (PRED FORTE) 1 % ophthalmic suspension Place 1 drop into the right eye 2 (two) times daily. Reported on 04/16/2015 (Patient taking differently: Place 1 drop into the right eye 4 (four) times daily. Reported on 04/16/2015) 5 mL 1  . oxyCODONE (OXY IR/ROXICODONE) 5 MG immediate release tablet Take 1 tablet (5 mg total) by mouth every 4 (four) hours as needed for severe pain. (Patient not taking: Reported on 09/03/2019) 30 tablet 0   No current facility-administered medications for this visit.       Physical Exam: BP (!) 141/88 (BP Location: Right Arm, Patient Position: Sitting, Cuff Size: Normal)   Pulse 86   Temp 97.6 F (36.4 C)   Resp 16   Ht 5\' 2"  (1.575 m)   Wt 129 lb 9.6 oz (58.8 kg)   SpO2 98% Comment: RA  BMI 23.70 kg/m   General appearance: alert and cooperative Neurologic: intact Heart: regular rate and rhythm, S1, S2 normal, no murmur, click, rub or gallop Lungs: diminished breath sounds bilaterally Abdomen: soft, non-tender; bowel sounds normal; no masses,  no organomegaly Extremities: extremities normal, atraumatic, no cyanosis or edema Wound: Port sites and chest tube site are well-healed, sutures were removed from the chest  tube site   Diagnostic Studies & Laboratory data:     Recent Radiology Findings:   DG Chest 2 View  Result Date: 09/03/2019 CLINICAL DATA:  64 year old female with concern for pneumothorax. History of sarcoidosis. EXAM: CHEST - 2 VIEW COMPARISON:  Chest radiograph dated 08/28/2019. FINDINGS: There is diffuse interstitial coarsening and fibrosis in keeping with sarcoidosis. There is a small right pleural effusion. No new consolidation. A small pneumothorax remains along the right lateral pleural surface, similar or decreased since the prior  radiograph. Stable cardiac silhouette. No acute osseous pathology. IMPRESSION: 1. Small right pneumothorax and small right pleural effusion, similar or slightly decreased since the prior radiograph. 2. Diffuse pulmonary fibrosis in keeping with history of sarcoidosis. Electronically Signed   By: Anner Crete M.D.   On: 09/03/2019 15:24   I have independently reviewed the above radiology studies  and reviewed the findings with the patient.-X-ray shows diffuse pulmonary fibrosis bilaterally, compared to preop study there is does not appear to be any significant pneumothorax, pleural surface appears thickened as expected with the talc pleurodesis    Recent Lab Findings: Lab Results  Component Value Date   WBC 5.5 08/25/2019   HGB 10.6 (L) 08/25/2019   HCT 34.7 (L) 08/25/2019   PLT 154 08/25/2019   GLUCOSE 102 (H) 08/25/2019   CHOL 233 (A) 11/17/2018   TRIG 106 11/17/2018   HDL 77 (A) 11/17/2018   LDLCALC 18 11/17/2018   ALT 30 08/25/2019   AST 45 (H) 08/25/2019   NA 133 (L) 08/25/2019   K 3.8 08/25/2019   CL 101 08/25/2019   CREATININE 0.63 08/25/2019   BUN 6 (L) 08/25/2019   CO2 25 08/25/2019   TSH 1.87 08/03/2015   INR 1.1 08/22/2019   HGBA1C 6.0 (H) 08/24/2019      Assessment / Plan:   #1 thinking patient making satisfactory progress gross after recent mechanical talc pleurodesis via redo VATS-without significant pneumothorax  The patient continues to use oxygen at home-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  Plan to see her back in 1 month with a follow-up chest x-ray  She has had Covid vaccination-I have warned her to take precautions to avoid possible infection   Medication Changes: No orders of the defined types were placed in this encounter.     Grace Isaac MD      [address] [place] [address] Office  [phone]     09/03/2019 5:06 PM

## 2019-09-03 NOTE — Telephone Encounter (Signed)
Lauren Massey daughter would like to discuss disability forms. Danielle phone number is [phone].

## 2019-09-03 NOTE — Telephone Encounter (Signed)
Pt was walked using continuous O2 at last OV 7/28 and required 3L to keep sats stable. Called pt's daughter Andee Poles and spoke with both her and pt in regards to the difference between continuous O2 and POC being pulsed flow. Stated to them that we would need to do qualifying walk using POC to make sure pt would be able to tolerate it and they verbalized understanding.  They stated that pt has an appt currently scheduled with CY in November 2021 and they are wanting to move this appt up sooner.  Dr. Annamaria Boots, please advise if you are okay with [place] using a RNA slot to get pt in for a sooner appt with you.

## 2019-09-04 NOTE — Telephone Encounter (Signed)
Yes ok 

## 2019-09-04 NOTE — Telephone Encounter (Signed)
Tried calling the pt and there was no answer and VM was full

## 2019-09-05 NOTE — Telephone Encounter (Signed)
ATC pts daughter, no answer and unable to leave VM.  Called pts phone number and spoke with pt. Pt has been scheduled for OV with CY on 10/01/19 at 1130. Advised pt the need for a walk test to confirm she can tolerate the POC. Walk test scheduled for 10/01/19 at 1100. Pt verbalized understanding and denied any further questions or concerns at this time.

## 2019-09-06 NOTE — Telephone Encounter (Signed)
Completed both disability and fmla for patient - will fax to our [place] office on Monday 09/09/2019 for Eric Form, NP to sign as she was the provided that saw the patient last and referred to ED -pr

## 2019-09-10 DIAGNOSIS — J849 Interstitial pulmonary disease, unspecified: Secondary | ICD-10-CM

## 2019-09-10 NOTE — Telephone Encounter (Signed)
Called and informed patient that forms are ready - pt to come to the office to sign documents and pay $29 fee. -pr

## 2019-09-10 NOTE — Telephone Encounter (Signed)
Patient came to the office - paid fee. I have faxed FMLA paperwork to patient HR group, and short term disability forms to [employer].-pr

## 2019-09-11 ENCOUNTER — Telehealth: Payer: Self-pay | Admitting: Acute Care

## 2019-09-11 NOTE — Telephone Encounter (Signed)
Patient called states that she had been out of work since 07/16/2019. Updated short term disability and fmla forms and refaxed them to her HR dept and to [employer] -pr

## 2019-09-12 ENCOUNTER — Ambulatory Visit: Payer: 59 | Admitting: Cardiothoracic Surgery

## 2019-09-12 ENCOUNTER — Telehealth: Payer: Self-pay

## 2019-09-12 NOTE — Telephone Encounter (Signed)
ok 

## 2019-09-12 NOTE — Telephone Encounter (Signed)
-----   Message from Greggory Keen, LPN sent at 8/86/7737 12:53 PM EDT -----  ----- Message ----- From: Mauri Pole, MD Sent: 07/02/2019   9:54 AM EDT To: Greggory Keen, LPN  Beth, I had to cancel the procedure, she developed reaction to Su tabs.  Will use either MiraLAX or Suprep for next procedure.  She prefers Fridays, will need to be done at [facility] endoscopy unit.  Can you check if I can schedule her for procedure the week of September when I am doing inpatient week.Thank you

## 2019-09-12 NOTE — Telephone Encounter (Signed)
Spoke with the patient to schedule her colonoscopy at [facility] long Endoscopy. The patient states she cannot do this yet. Her "lung collapsed again" and she has not been released for the procedure. She is unsure when she will be considered able. She would like to talk with Dr Annamaria Boots about this first. She "does not want to commit to any date." Patient given my name and phone number to call me when she is ready to schedule. I will put in a recall for 6 month to follow up as well.

## 2019-09-25 ENCOUNTER — Other Ambulatory Visit: Payer: Self-pay | Admitting: Cardiothoracic Surgery

## 2019-09-25 DIAGNOSIS — J939 Pneumothorax, unspecified: Secondary | ICD-10-CM

## 2019-09-25 NOTE — Progress Notes (Signed)
[address]       [place] [address]             [phone]      Lauren Massey [place] Medical Record #[id number] Date of Birth: 1955-10-11  Referring: Rise Patience, MD Primary Care: Hoyt Koch, MD Primary Cardiologist: Donato Heinz, MD   Chief Complaint:   POST OP FOLLOW UP OPERATIVE REPORT DATE OF PROCEDURE:  08/23/2019 PREOPERATIVE DIAGNOSIS:  Recurrent right pneumothorax with underlying sarcoid. POSTOPERATIVE DIAGNOSIS:  Recurrent right pneumothorax with underlying sarcoid. SURGICAL PROCEDURE:   1.  Redo right video-assisted thoracoscopy with mechanical pleurodesis and talc pleurodesis.  2.  Placement of right chest tube.  History of Present Illness:     Patient returns to the office today with a follow-up chest x-ray after recent redo right video-assisted thoracoscopy with mechanical and talc pleurodesis for recurrent pneumothorax on the right.  She had previously undergone robotic assisted apical pleurectomy and pleurodesis in February 2021.   Since discharge she has been doing well around the house, she remains on home oxygen but she is not using it today as she comes to the office.  Follow-up chest x-ray was done today   Past Medical History:  Diagnosis Date  . Cataract    right eye  . Pneumothorax   . Sarcoidosis    skin, eye, lung  . Unspecified iridocyclitis      Social History   Tobacco Use  Smoking Status Never Smoker  Smokeless Tobacco Never Used    Social History   Substance and Sexual Activity  Alcohol Use No     No Known Allergies  Current Outpatient Medications  Medication Sig Dispense Refill  . albuterol (VENTOLIN HFA) 108 (90 Base) MCG/ACT inhaler Inhale 2 puffs into the lungs every 6 (six) hours as needed for wheezing or shortness of breath. 18 g 12  . folic acid (FOLVITE) 1 MG tablet Take 5 mg by mouth at bedtime.     . hydrochlorothiazide (MICROZIDE) 12.5 MG capsule Take 1  capsule (12.5 mg total) by mouth daily. 90 capsule 3  . methotrexate (RHEUMATREX) 2.5 MG tablet Take 7.5 mg by mouth once a week. Caution:Chemotherapy. Protect from light.  Pt takes 3 tablets every Wednesday.    Marland Kitchen oxyCODONE (OXY IR/ROXICODONE) 5 MG immediate release tablet Take 1 tablet (5 mg total) by mouth every 4 (four) hours as needed for severe pain. 30 tablet 0  . prednisoLONE acetate (PRED FORTE) 1 % ophthalmic suspension Place 1 drop into the right eye 2 (two) times daily. Reported on 04/16/2015 (Patient taking differently: Place 1 drop into the right eye 4 (four) times daily. Reported on 04/16/2015) 5 mL 1   No current facility-administered medications for this visit.       Physical Exam: BP (!) 160/80   Pulse 88   Temp 97.6 F (36.4 C) (Skin)   Resp 20   Ht 5\' 2"  (1.575 m)   Wt 131 lb (59.4 kg)   SpO2 95% Comment: RA  BMI 23.96 kg/m   General appearance: alert, cooperative and no distress Resp: diminished breath sounds bilaterally Cardio: regular rate and rhythm, S1, S2 normal, no murmur, click, rub or gallop GI: soft, non-tender; bowel sounds normal; no masses,  no organomegaly Extremities: extremities normal, atraumatic, no cyanosis or edema and Homans sign is negative, no sign of DVT Neurologic: Grossly normal Incision port sites chest tube sites are well-healed  Diagnostic Studies & Laboratory  data:     Recent Radiology Findings:   DG Chest 2 View  Result Date: 09/26/2019 CLINICAL DATA:  Follow-up pneumothorax on right. VATS 08/23/2019. Sarcoidosis. EXAM: CHEST - 2 VIEW COMPARISON:  09/03/2019 FINDINGS: Diffuse bilateral airspace disease unchanged. This is most prominent in the right upper lobe and most compatible with scarring from sarcoid. Chronic pleural scarring in the right lung base also unchanged. No acute infiltrate or effusion. IMPRESSION: Chronic lung disease with diffuse pulmonary scarring due to sarcoid. No superimposed acute abnormality and no interval  change. Negative for pneumothorax. Electronically Signed   By: Franchot Gallo M.D.   On: 09/26/2019 15:40   I have independently reviewed the above radiology studies  and reviewed the findings with the patient.    Recent Lab Findings: Lab Results  Component Value Date   WBC 5.5 08/25/2019   HGB 10.6 (L) 08/25/2019   HCT 34.7 (L) 08/25/2019   PLT 154 08/25/2019   GLUCOSE 102 (H) 08/25/2019   CHOL 233 (A) 11/17/2018   TRIG 106 11/17/2018   HDL 77 (A) 11/17/2018   LDLCALC 18 11/17/2018   ALT 30 08/25/2019   AST 45 (H) 08/25/2019   NA 133 (L) 08/25/2019   K 3.8 08/25/2019   CL 101 08/25/2019   CREATININE 0.63 08/25/2019   BUN 6 (L) 08/25/2019   CO2 25 08/25/2019   TSH 1.87 08/03/2015   INR 1.1 08/22/2019   HGBA1C 6.0 (H) 08/24/2019      Assessment / Plan:   Patient is stable following recent second surgical intervention for recurrent right pneumothorax-chest x-ray is now stable without evidence of recurrence  She has had Covid vaccination-I have warned her to take precautions to avoid possible infection  She will return to see [place] between 2 and 3 months with a follow-up chest x-ray-she continues to be followed in the pulmonary office, she will discuss with Dr. Annamaria Boots possibility of a portable concentrator-to make her mobility better   Medication Changes: No orders of the defined types were placed in this encounter.     Grace Isaac MD      [address] [place] [address] Office [phone]     09/26/2019 4:17 PM

## 2019-09-26 ENCOUNTER — Ambulatory Visit
Admission: RE | Admit: 2019-09-26 | Discharge: 2019-09-26 | Disposition: A | Discharge status: Home / Self Care | Payer: 59 | Source: Ambulatory Visit | Attending: Cardiothoracic Surgery | Admitting: Cardiothoracic Surgery

## 2019-09-26 ENCOUNTER — Other Ambulatory Visit: Payer: Self-pay

## 2019-09-26 ENCOUNTER — Ambulatory Visit (INDEPENDENT_AMBULATORY_CARE_PROVIDER_SITE_OTHER): Payer: Self-pay | Admitting: Cardiothoracic Surgery

## 2019-09-26 VITALS — BP 160/80 | HR 88 | Temp 97.6°F | Resp 20 | Ht 62.0 in | Wt 131.0 lb

## 2019-09-26 DIAGNOSIS — J939 Pneumothorax, unspecified: Secondary | ICD-10-CM

## 2019-09-26 DIAGNOSIS — D860 Sarcoidosis of lung: Secondary | ICD-10-CM

## 2019-09-26 DIAGNOSIS — Z09 Encounter for follow-up examination after completed treatment for conditions other than malignant neoplasm: Secondary | ICD-10-CM

## 2019-10-01 ENCOUNTER — Ambulatory Visit: Payer: 59 | Admitting: Internal Medicine

## 2019-10-01 ENCOUNTER — Ambulatory Visit: Payer: 59

## 2019-10-01 ENCOUNTER — Encounter: Payer: Self-pay | Admitting: Internal Medicine

## 2019-10-01 ENCOUNTER — Other Ambulatory Visit: Payer: Self-pay

## 2019-10-01 ENCOUNTER — Ambulatory Visit (INDEPENDENT_AMBULATORY_CARE_PROVIDER_SITE_OTHER): Payer: 59

## 2019-10-01 VITALS — BP 118/72 | HR 79 | Temp 97.2°F | Ht 62.0 in | Wt 133.0 lb

## 2019-10-01 DIAGNOSIS — D869 Sarcoidosis, unspecified: Secondary | ICD-10-CM | POA: Diagnosis not present

## 2019-10-01 DIAGNOSIS — Z23 Encounter for immunization: Secondary | ICD-10-CM | POA: Diagnosis not present

## 2019-10-01 DIAGNOSIS — J9611 Chronic respiratory failure with hypoxia: Secondary | ICD-10-CM

## 2019-10-01 NOTE — Progress Notes (Signed)
Patient ID: Lauren Massey, female    DOB: 03/01/1955, 64 y.o.   MRN: [id number]  HPI  Female never smoker followed for Sarcoid III involving eyes/iritis, lungs and skin-bronchoscopy 2000. Failed Plaquenil for skin rash. ACE 02/19/15- elevated 97, down from >100 07/2014.  ACE 05/12/16-   50 (9-67) Office Spirometry 08/06/2015-moderate restriction. FVC 1.53/63%, FEV1 1.19/62%, FEV1/FVC 0.78 Room air saturation at rest on arrival 03/14/16- 88%, desaturating to 85% with ambulation and improving to 98% ambulating on 2 L. PFT 03/16/17-moderately severe restriction, severe reduction of diffusion.  FVC 1.62/66%, FEV1 1.46/76%, ratio 0.90, TLC 50%, DLCO 26% Had VATS/ robotic pleurectomy 02/2019 and again 08/23/19-for recurrent ptx realted to fibrocystic sarcoid. -------------------------------------------------------------------------------------------------------  04/18/19    64 year old female never smoker followed for Sarcoid III involving eyes/iritis, lungs,rectum, skin-bronchoscopy 2000, chronic hypoxic respiratory failure O2 2 Lsleep / APS     ACE in 2018 was down to 50 ( 9-67) MTX, , Proventil HFA, Trelegy, Had VATS/ robotic pleurectomy for recurrent ptx realted to fibrocystic sarcoid. Now has just mild dry cough. Twinges sharp R chest wall pain- residual.  10/01/19- 64 year old female never smoker followed for Sarcoid III involving eyes/iritis, lungs,rectum, skin-bronchoscopy 2000, chronic hypoxic respiratory failure O2   1.5-2 Lsleep / APS     ACE in 2018 was down to 50 ( 9-67) MTX, , Proventil HFA, Trelegy?, Had VATS/ robotic pleurodesis 02/2019 and again 08/23/19-for recurrent ptx realted to fibrocystic sarcoid. 6 MWT 10/01/19- Arrived RA 96%, desat to 87% on room air, corrected to 91% on 2L. Little cough. R lateral chest pains and numbness residual s/p VATS.  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 Doesn't want to stop working. Supplemental O2 and soreness will  limit what she can do.  After discussion, she would like to talk with her employer/ HR at return to work part-time as of October 15 (still on short term leave for now) with limited lifting 5 lbs, frequent rest intervals.  She will have fu with Dr Servando Snare and with me in a few months.  She has left a disability form with me, but I will wait to complete it until we learn what her employer says. I respect her motivation to work, but her O2 requirement may mean total disability or sedentary work. CXR 09/26/19-  IMPRESSION: Chronic lung disease with diffuse pulmonary scarring due to sarcoid. No superimposed acute abnormality and no interval change. Negative for pneumothorax.  Review of Systems- see HPI   + = positive Constitutional:   weight loss, night sweats, fevers, chills, fatigue, lassitude. HEENT:   No-  headaches, difficulty swallowing, tooth/dental problems, sore throat,       No-  sneezing, itching, ear ache, nasal congestion, post nasal drip,  CV:  +chest pain, orthopnea, PND, swelling in lower extremities, anasarca,   dizziness, palpitations Resp:+shortness of breath with exertion or at rest.               productive cough,  + non-productive cough,  No-  coughing up of blood.              No-   change in color of mucus.  No- wheezing.   Skin: +  rash or lesions. GI:  No-   heartburn, indigestion, abdominal pain, nausea, vomiting,  GU:  MS:  No-   joint pain or swelling.  No- decreased range of motion.  No- back pain. Neuro- nothing unusual Psych:  No- change in mood or affect.  No depression or anxiety.  No memory loss.  Objective:   Physical Exam General- Alert, Oriented, Affect-appropriate, Distress- none acute, slender Skin- + multiple small linear plaques along flexure lines left side of neck, down onto upper chest, low back,  in the right posterior axillary line and extensor surfaces of forearms and elbows she has stable macular lesions which she says itch and are tender. +  Reddened nonspecific dermatitis dorsum right hand. Lymphadenopathy- none Head- atraumatic            Eyes- Gross vision intact, PERRLA, conjunctivae clear secretions            Ears- Hearing, canals normal            Nose- Clear, No-Septal dev, mucus, polyps, erosion, perforation             Throat- Mallampati IV , mucosa clear , drainage- none, tonsils- atrophic Neck- flexible , trachea midline, no stridor , thyroid nl, carotid no bruit Chest - symmetrical excursion , unlabored           Heart/CV- RRR , no murmur , no gallop  , no rub, nl s1,    +P2 prominent                           - JVD +1 , edema- none, stasis changes- none, varices- none           Lung- +diminished on R, wheeze- none, cough - none , dullness-none, rub- none,                 Chest wall- + 2 cm rubbery somewhat mobile subcutaneous nodule at distal end of left clavicle- probable lipoma Abd- no HSM Br/ Gen/ Rectal- Not done, not indicated Extrem- cyanosis- none, clubbing, none, atrophy- none, strength- nl, +1 cm rubbery nodule at base R thumb Neuro- grossly intact to observation Assessment & Plan:

## 2019-10-01 NOTE — Assessment & Plan Note (Signed)
She now qualifies for portable O2 and wants a POC.  Plan- POC 2L pulse, continue O2 2L for sleep

## 2019-10-01 NOTE — Patient Instructions (Addendum)
Order- Fu vax standard  Order- DME APS/ [employer] Portable concentrator 2L/min pulse      Dx Chronic Respiratory Failure with Hypoxia  Order- CXR    Dx Sarcoid, s/p right pleurodesis

## 2019-10-01 NOTE — Assessment & Plan Note (Signed)
Residual scarring with blebs and spontaneous pneumothorax, now s/p Pleurodesis.  Plan- CXR, Flu vax  Recommend Phizer booster this Fall.

## 2019-10-04 ENCOUNTER — Telehealth: Payer: Self-pay | Admitting: Internal Medicine

## 2019-10-04 NOTE — Telephone Encounter (Signed)
Called patient advised I received accommodation form via email from her daughter - advised that I have completed form and will give to CY on Monday to sign and will email it back to her daughter per her request. -pr

## 2019-10-09 NOTE — Telephone Encounter (Signed)
Accommodation completed and emailed to patient daughter per her request, also CY had disability forms given to him by patient at her last visit, CY completed and I have faxed it to [employer]. -pr

## 2019-10-10 ENCOUNTER — Telehealth: Payer: Self-pay | Admitting: Internal Medicine

## 2019-10-10 NOTE — Telephone Encounter (Signed)
Updated patient's ADA paperwork and emailed it to patient's employer, also updated FMLA paperwork to reflect patient return to work date as 11/09/2019. - pr

## 2019-10-10 NOTE — Telephone Encounter (Signed)
Let's say 1/2 time- 20 hours/ week

## 2019-10-11 ENCOUNTER — Encounter (HOSPITAL_COMMUNITY): Payer: Self-pay

## 2019-10-11 ENCOUNTER — Ambulatory Visit (HOSPITAL_COMMUNITY): Admit: 2019-10-11 | Payer: 59 | Admitting: Gastroenterology

## 2019-10-11 SURGERY — COLONOSCOPY WITH PROPOFOL
Anesthesia: Monitor Anesthesia Care

## 2019-10-18 ENCOUNTER — Telehealth: Payer: Self-pay | Admitting: Internal Medicine

## 2019-10-18 NOTE — Telephone Encounter (Signed)
Patient came and dropped off additional disability forms from [place]. Patient's employer unable to accommodate her restrictions of part time work. Will give to Dr. Annamaria Boots to sign on Monday 10/21/2019 - when he returns to the office -pr

## 2019-10-21 NOTE — Telephone Encounter (Signed)
Patient form completed. Faxed to [employer] per patient request at [phone]. Patient will pick up copy of form and medical records requested, placed up front in the filing cabinet. -pr

## 2019-11-15 ENCOUNTER — Telehealth: Payer: Self-pay | Admitting: Internal Medicine

## 2019-11-15 NOTE — Telephone Encounter (Signed)
Rec'd faxed forms to determine physical capabilities and any work restriction for the patient - will give to Dr. Annamaria Boots on Monday 10/25 when he returns to the office -pr

## 2019-11-21 NOTE — Telephone Encounter (Signed)
Rec'd forms back from Dr. Annamaria Boots. Faxed forms and requested medical records to [facility] - Fax # [phone]

## 2019-12-06 ENCOUNTER — Ambulatory Visit: Payer: 59 | Admitting: Internal Medicine

## 2019-12-12 ENCOUNTER — Encounter: Payer: Self-pay | Admitting: Internal Medicine

## 2019-12-12 ENCOUNTER — Other Ambulatory Visit: Payer: Self-pay

## 2019-12-12 ENCOUNTER — Telehealth: Payer: Self-pay | Admitting: *Deleted

## 2019-12-12 ENCOUNTER — Ambulatory Visit (INDEPENDENT_AMBULATORY_CARE_PROVIDER_SITE_OTHER): Payer: 59 | Admitting: Internal Medicine

## 2019-12-12 VITALS — BP 158/80 | HR 72 | Temp 97.9°F | Ht 62.0 in | Wt 136.0 lb

## 2019-12-12 DIAGNOSIS — J9601 Acute respiratory failure with hypoxia: Secondary | ICD-10-CM | POA: Diagnosis not present

## 2019-12-12 DIAGNOSIS — J849 Interstitial pulmonary disease, unspecified: Secondary | ICD-10-CM

## 2019-12-12 NOTE — Patient Instructions (Signed)
We do not need any labs today. Think about getting the pneumonia vaccine.

## 2019-12-12 NOTE — Progress Notes (Signed)
   Subjective:   Patient ID: Lauren Massey, female    DOB: 02-09-1955, 64 y.o.   MRN: [id number]  HPI The patient is a 64 YO female coming in for follow up of recurrent pneumothorax due to her sarcoidosis. She has had several procedures done pleurodesis to help. She is hoping that the most recent will be successful. She has not had any pain with breathing or change in breathing recently. She does have SOB and is unable to work currently due to this condition which is a hard change for her. She also has some pain from where the chest tubes were placed multiple times. She is using otc pain medication for this.   Review of Systems  Constitutional: Negative.   HENT: Negative.   Eyes: Negative.   Respiratory: Positive for shortness of breath. Negative for cough and chest tightness.   Cardiovascular: Negative for chest pain, palpitations and leg swelling.  Gastrointestinal: Negative for abdominal distention, abdominal pain, constipation, diarrhea, nausea and vomiting.  Musculoskeletal: Negative.   Skin: Negative.   Neurological: Negative.   Psychiatric/Behavioral: Negative.     Objective:  Physical Exam Constitutional:      Appearance: She is well-developed.  HENT:     Head: Normocephalic and atraumatic.  Cardiovascular:     Rate and Rhythm: Normal rate and regular rhythm.  Pulmonary:     Effort: Pulmonary effort is normal. No respiratory distress.     Breath sounds: Normal breath sounds. No wheezing or rales.  Abdominal:     General: Bowel sounds are normal. There is no distension.     Palpations: Abdomen is soft.     Tenderness: There is no abdominal tenderness. There is no rebound.  Musculoskeletal:     Cervical back: Normal range of motion.  Skin:    General: Skin is warm and dry.  Neurological:     Mental Status: She is alert and oriented to person, place, and time.     Coordination: Coordination normal.     Vitals:   12/12/19 1455  BP: (!) 158/80  Pulse: 72  Temp:  97.9 F (36.6 C)  TempSrc: Oral  SpO2: 96%  Weight: 136 lb (61.7 kg)  Height: 5\' 2"  (1.575 m)    This visit occurred during the SARS-CoV-2 public health emergency.  Safety protocols were in place, including screening questions prior to the visit, additional usage of staff PPE, and extensive cleaning of exam room while observing appropriate contact time as indicated for disinfecting solutions.   Assessment & Plan:

## 2019-12-12 NOTE — Telephone Encounter (Signed)
A message was left, re: her follow up visit. 

## 2019-12-13 ENCOUNTER — Encounter: Payer: Self-pay | Admitting: Internal Medicine

## 2019-12-13 NOTE — Assessment & Plan Note (Signed)
She is following up with pulmonary for this and is currently on disability.

## 2019-12-13 NOTE — Assessment & Plan Note (Addendum)
Currently on disability due to recurrent pneumothorax with repeated hospital stay and chest tube placement and pleurodesis. Advised pneumonia vaccine which she declines today.

## 2019-12-26 ENCOUNTER — Ambulatory Visit: Payer: 59 | Admitting: Cardiothoracic Surgery

## 2019-12-31 ENCOUNTER — Other Ambulatory Visit: Payer: Self-pay | Admitting: Cardiothoracic Surgery

## 2019-12-31 DIAGNOSIS — J939 Pneumothorax, unspecified: Secondary | ICD-10-CM

## 2020-01-02 ENCOUNTER — Other Ambulatory Visit: Payer: Self-pay

## 2020-01-02 ENCOUNTER — Ambulatory Visit
Admission: RE | Admit: 2020-01-02 | Discharge: 2020-01-02 | Disposition: A | Discharge status: Home / Self Care | Payer: Medicare HMO | Source: Ambulatory Visit | Attending: Cardiothoracic Surgery | Admitting: Cardiothoracic Surgery

## 2020-01-02 ENCOUNTER — Encounter: Payer: Self-pay | Admitting: Cardiothoracic Surgery

## 2020-01-02 ENCOUNTER — Ambulatory Visit: Payer: Medicare HMO | Admitting: Cardiothoracic Surgery

## 2020-01-02 VITALS — BP 135/87 | HR 73 | Resp 18 | Ht 62.0 in | Wt 134.0 lb

## 2020-01-02 DIAGNOSIS — J939 Pneumothorax, unspecified: Secondary | ICD-10-CM

## 2020-01-02 DIAGNOSIS — M19012 Primary osteoarthritis, left shoulder: Secondary | ICD-10-CM | POA: Diagnosis not present

## 2020-01-02 DIAGNOSIS — M19011 Primary osteoarthritis, right shoulder: Secondary | ICD-10-CM | POA: Diagnosis not present

## 2020-01-02 DIAGNOSIS — J849 Interstitial pulmonary disease, unspecified: Secondary | ICD-10-CM | POA: Diagnosis not present

## 2020-01-02 NOTE — Progress Notes (Signed)
[address] [address]       [place] [address]             [phone]      Lauren Massey Medical Record #[id number] Date of Birth: Nov 28, 1955  Referring: Rise Patience, MD Primary Care: Hoyt Koch, MD Primary Cardiologist: Donato Heinz, MD   Chief Complaint:   POST OP FOLLOW UP OPERATIVE REPORT DATE OF PROCEDURE:  08/23/2019 PREOPERATIVE DIAGNOSIS:  Recurrent right pneumothorax with underlying sarcoid. POSTOPERATIVE DIAGNOSIS:  Recurrent right pneumothorax with underlying sarcoid. SURGICAL PROCEDURE:   1.  Redo right video-assisted thoracoscopy with mechanical pleurodesis and talc pleurodesis.  2.  Placement of right chest tube.  History of Present Illness:     Patient returns to the office today with a follow-up chest x-ray most recently she had undergone redo right video-assisted thoracoscopy with mechanical pleurodesis and talc pleurodesis for recurrent right pneumothorax this was done in July 2021 and previously robotically in January 2021.   Patient notes that she continues to have limitations on her respiratory capacity, needing oxygen most of the time.  She is no longer able to work in [employer] where she has been working.    Follow-up chest x-ray was done today   Past Medical History:  Diagnosis Date  . Cataract    right eye  . Pneumothorax   . Sarcoidosis    skin, eye, lung  . Unspecified iridocyclitis      Social History   Tobacco Use  Smoking Status Never Smoker  Smokeless Tobacco Never Used    Social History   Substance and Sexual Activity  Alcohol Use No     No Known Allergies  Current Outpatient Medications  Medication Sig Dispense Refill  . albuterol (VENTOLIN HFA) 108 (90 Base) MCG/ACT inhaler Inhale 2 puffs into the lungs every 6 (six) hours as needed for wheezing or shortness of breath. 18 g 12  . folic acid (FOLVITE) 1 MG tablet Take 5 mg by mouth at bedtime.     .  hydrochlorothiazide (MICROZIDE) 12.5 MG capsule Take 1 capsule (12.5 mg total) by mouth daily. 90 capsule 3  . methotrexate (RHEUMATREX) 2.5 MG tablet Take 2.5 mg by mouth once a week. Caution:Chemotherapy. Protect from light. Takes 3 tabs a week    . prednisoLONE acetate (PRED FORTE) 1 % ophthalmic suspension Place 1 drop into the right eye 2 (two) times daily. Reported on 04/16/2015 (Patient taking differently: Place 1 drop into the right eye 4 (four) times daily. Reported on 04/16/2015) 5 mL 1   No current facility-administered medications for this visit.       Physical Exam: BP 135/87 (BP Location: Left Arm, Patient Position: Sitting)   Pulse 73   Resp 18   Ht 5\' 2"  (1.575 m)   Wt 134 lb (60.8 kg)   SpO2 92% Comment: 2L O2 [place]  BMI 24.51 kg/m   General appearance: alert, cooperative, appears stated age and no distress Neck: no adenopathy, no carotid bruit, no JVD, supple, symmetrical, trachea midline and thyroid not enlarged, symmetric, no tenderness/mass/nodules Resp: diminished breath sounds bilaterally Cardio: regular rate and rhythm, S1, S2 normal, no murmur, click, rub or gallop Extremities: extremities normal, atraumatic, no cyanosis or edema and Homans sign is negative, no sign of DVT Neurologic: Grossly normal  Incision port sites chest tube sites are well-healed  Diagnostic Studies & Laboratory data:     Recent Radiology Findings:   DG Chest  2 View  Result Date: 01/02/2020 CLINICAL DATA:  History of right pneumothorax. EXAM: CHEST - 2 VIEW COMPARISON:  10/01/2019.  09/03/2019.  CT 08/22/2019. FINDINGS: Mediastinum and hilar structures are stable. Heart size normal. Severe chronic bilateral interstitial changes and bullous changes are again noted. Bilateral pleural thickening consistent with scarring again noted. No evidence of pneumothorax on today's exam. No acute bony abnormality. Degenerative changes both shoulders. High-riding shoulders noted bilaterally suggesting  bilateral rotator cuff tears. IMPRESSION: Severe chronic bilateral interstitial changes and bullous changes again noted. Bilateral pleural thickening consistent with scarring again noted. No evidence of pneumothorax on today's exam. Electronically Signed   By: Marcello Moores  Register   On: 01/02/2020 15:01   I have independently reviewed the above radiology studies  and reviewed the findings with the patient.    Recent Lab Findings: Lab Results  Component Value Date   WBC 5.5 08/25/2019   HGB 10.6 (L) 08/25/2019   HCT 34.7 (L) 08/25/2019   PLT 154 08/25/2019   GLUCOSE 102 (H) 08/25/2019   CHOL 233 (A) 11/17/2018   TRIG 106 11/17/2018   HDL 77 (A) 11/17/2018   LDLCALC 18 11/17/2018   ALT 30 08/25/2019   AST 45 (H) 08/25/2019   NA 133 (L) 08/25/2019   K 3.8 08/25/2019   CL 101 08/25/2019   CREATININE 0.63 08/25/2019   BUN 6 (L) 08/25/2019   CO2 25 08/25/2019   TSH 1.87 08/03/2015   INR 1.1 08/22/2019   HGBA1C 6.0 (H) 08/24/2019      Assessment / Plan:   #1 patient stable following recent repeat surgical intervention to prevent recurrent right pneumothorax-there is been no evidence of recurrence #2 underlying sarcoid ptosis with severe bilateral pulmonary disease Patient is currently on Covid vaccinations  Patient is to be seen in the pulmonary office in the next month-she will discuss with Dr. Annamaria Boots the possibility if pulmonary rehab would help her overall situation  We will plan on seeing her back as necessary  Medication Changes: No orders of the defined types were placed in this encounter.     Grace Isaac MD      [address] [place] [address] Office [phone]     01/02/2020 5:41 PM

## 2020-01-06 DIAGNOSIS — J9621 Acute and chronic respiratory failure with hypoxia: Secondary | ICD-10-CM | POA: Diagnosis not present

## 2020-01-12 DIAGNOSIS — J9621 Acute and chronic respiratory failure with hypoxia: Secondary | ICD-10-CM | POA: Diagnosis not present

## 2020-01-28 ENCOUNTER — Telehealth: Payer: Self-pay | Admitting: *Deleted

## 2020-01-28 NOTE — Telephone Encounter (Signed)
A message was left, re: her follow up visit. 

## 2020-02-03 NOTE — Telephone Encounter (Signed)
No entry 

## 2020-02-06 DIAGNOSIS — J9621 Acute and chronic respiratory failure with hypoxia: Secondary | ICD-10-CM | POA: Diagnosis not present

## 2020-02-12 DIAGNOSIS — J9621 Acute and chronic respiratory failure with hypoxia: Secondary | ICD-10-CM | POA: Diagnosis not present

## 2020-02-14 ENCOUNTER — Ambulatory Visit: Payer: 59 | Admitting: Internal Medicine

## 2020-02-25 DIAGNOSIS — N898 Other specified noninflammatory disorders of vagina: Secondary | ICD-10-CM | POA: Diagnosis not present

## 2020-02-25 DIAGNOSIS — N951 Menopausal and female climacteric states: Secondary | ICD-10-CM | POA: Diagnosis not present

## 2020-02-25 DIAGNOSIS — Z6825 Body mass index (BMI) 25.0-25.9, adult: Secondary | ICD-10-CM | POA: Diagnosis not present

## 2020-02-25 DIAGNOSIS — Z01419 Encounter for gynecological examination (general) (routine) without abnormal findings: Secondary | ICD-10-CM | POA: Diagnosis not present

## 2020-03-08 DIAGNOSIS — J9621 Acute and chronic respiratory failure with hypoxia: Secondary | ICD-10-CM | POA: Diagnosis not present

## 2020-03-14 DIAGNOSIS — J9621 Acute and chronic respiratory failure with hypoxia: Secondary | ICD-10-CM | POA: Diagnosis not present

## 2020-03-17 DIAGNOSIS — H2512 Age-related nuclear cataract, left eye: Secondary | ICD-10-CM | POA: Diagnosis not present

## 2020-03-17 DIAGNOSIS — H35371 Puckering of macula, right eye: Secondary | ICD-10-CM | POA: Diagnosis not present

## 2020-03-17 DIAGNOSIS — H18421 Band keratopathy, right eye: Secondary | ICD-10-CM | POA: Diagnosis not present

## 2020-03-17 DIAGNOSIS — D869 Sarcoidosis, unspecified: Secondary | ICD-10-CM | POA: Diagnosis not present

## 2020-03-17 DIAGNOSIS — H30003 Unspecified focal chorioretinal inflammation, bilateral: Secondary | ICD-10-CM | POA: Diagnosis not present

## 2020-03-17 DIAGNOSIS — Z961 Presence of intraocular lens: Secondary | ICD-10-CM | POA: Diagnosis not present

## 2020-03-17 DIAGNOSIS — Z79899 Other long term (current) drug therapy: Secondary | ICD-10-CM | POA: Diagnosis not present

## 2020-03-17 DIAGNOSIS — H21542 Posterior synechiae (iris), left eye: Secondary | ICD-10-CM | POA: Diagnosis not present

## 2020-03-17 DIAGNOSIS — H3581 Retinal edema: Secondary | ICD-10-CM | POA: Diagnosis not present

## 2020-03-18 DIAGNOSIS — H30003 Unspecified focal chorioretinal inflammation, bilateral: Secondary | ICD-10-CM | POA: Diagnosis not present

## 2020-03-18 DIAGNOSIS — H524 Presbyopia: Secondary | ICD-10-CM | POA: Diagnosis not present

## 2020-03-18 DIAGNOSIS — Z79899 Other long term (current) drug therapy: Secondary | ICD-10-CM | POA: Diagnosis not present

## 2020-03-18 DIAGNOSIS — H52209 Unspecified astigmatism, unspecified eye: Secondary | ICD-10-CM | POA: Diagnosis not present

## 2020-03-18 DIAGNOSIS — H5213 Myopia, bilateral: Secondary | ICD-10-CM | POA: Diagnosis not present

## 2020-04-03 NOTE — Progress Notes (Signed)
Patient ID: Lauren Massey, female    DOB: April 23, 1955, 65 y.o.   MRN: [id number]  HPI  Female never smoker followed for Sarcoid III involving eyes/iritis, lungs and skin-bronchoscopy 2000. Failed Plaquenil for skin rash. ACE 02/19/15- elevated 97, down from >100 07/2014.  ACE 05/12/16-   50 (9-67) Office Spirometry 08/06/2015-moderate restriction. FVC 1.53/63%, FEV1 1.19/62%, FEV1/FVC 0.78 Room air saturation at rest on arrival 03/14/16- 88%, desaturating to 85% with ambulation and improving to 98% ambulating on 2 L. PFT 03/16/17-moderately severe restriction, severe reduction of diffusion.  FVC 1.62/66%, FEV1 1.46/76%, ratio 0.90, TLC 50%, DLCO 26% Had VATS/ robotic pleurectomy 02/2019 and again 08/23/19-for recurrent ptx realted to fibrocystic sarcoid. 6 MWT 10/01/19- Arrived RA 96%, desat to 87% on room air, corrected to 91% on 2L. -------------------------------------------------------------------------------------------------------   10/01/19- 65 year old female never smoker followed for Sarcoid III involving eyes/iritis, lungs,rectum, skin-bronchoscopy 2000, chronic hypoxic respiratory failure O2   1.5-2 Lsleep / APS     ACE in 2018 was down to 50 ( 9-67) MTX, , Proventil HFA, Trelegy?, Had VATS/ robotic pleurodesis 02/2019 and again 08/23/19-for recurrent ptx realted to fibrocystic sarcoid. 6 MWT 10/01/19- Arrived RA 96%, desat to 87% on room air, corrected to 91% on 2L. Little cough. R lateral chest pains and numbness residual s/p VATS.  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 Doesn't want to stop working. Supplemental O2 and soreness will limit what she can do.  After discussion, she would like to talk with her employer/ HR at return to work part-time as of October 15 (still on short term leave for now) with limited lifting 5 lbs, frequent rest intervals.  She will have fu with Dr Servando Snare and with me in a few months.  She has left a disability form with me, but  I will wait to complete it until we learn what her employer says. I respect her motivation to work, but her O2 requirement may mean total disability or sedentary work. CXR 09/26/19-  IMPRESSION: Chronic lung disease with diffuse pulmonary scarring due to sarcoid. No superimposed acute abnormality and no interval change. Negative for pneumothorax.  04/06/20- 65 year old female never smoker followed for Sarcoid III involving eyes/iritis, lungs,rectum, skin-bronchoscopy 2000, chronic hypoxic respiratory failure Had VATS/ robotic pleurodesis 02/2019 and again 08/23/19-for recurrent ptx realted to fibrocystic sarcoid ACE in 2018 was down to 50 ( 9-67) O2   1.5-2 L sleep / APS-[employer]     MTX, , Proventil HFA,  Covid vax- Flu vax- -----Patients wears 2 liters during the day and 1 1/2 at night. Patient has a productive cough with clear/white sputum. Worse in the morning.   No recent acute issues. Does have morning cough with white phlegm, then clear rest of day. Does not have maintenance inhaler. We're going to try Trelegy samples. Continues MTX and being followed closely for occular sarcoid, CXR- 01/02/20- IMPRESSION: Severe chronic bilateral interstitial changes and bullous changes again noted. Bilateral pleural thickening consistent with scarring again noted. No evidence of pneumothorax on today's exam.  Review of Systems- see HPI   + = positive Constitutional:   weight loss, night sweats, fevers, chills, fatigue, lassitude. HEENT:   No-  headaches, difficulty swallowing, tooth/dental problems, sore throat,       No-  sneezing, itching, ear ache, nasal congestion, post nasal drip,  CV:  +chest pain, orthopnea, PND, swelling in lower extremities, anasarca,   dizziness, palpitations Resp:+shortness of breath with exertion or at rest.               +  productive cough,   non-productive cough,  No-  coughing up of blood.              No-   change in color of mucus.  No- wheezing.   Skin: +  rash or  lesions. GI:  No-   heartburn, indigestion, abdominal pain, nausea, vomiting,  GU:  MS:  No-   joint pain or swelling.  No- decreased range of motion.  No- back pain. Neuro- nothing unusual Psych:  No- change in mood or affect. No depression or anxiety.  No memory loss.  Objective:   Physical Exam General- Alert, Oriented, Affect-appropriate, Distress- none acute, slender Skin- + multiple small linear plaques along flexure lines left side of neck, down onto upper chest, low back,  in the right posterior axillary line and extensor surfaces of forearms and elbows she has stable macular lesions which she says itch and are tender. + Reddened nonspecific dermatitis dorsum right hand. Lymphadenopathy- none Head- atraumatic            Eyes- Gross vision intact, PERRLA, conjunctivae clear secretions            Ears- Hearing, canals normal            Nose- Clear, No-Septal dev, mucus, polyps, erosion, perforation             Throat- Mallampati IV , mucosa clear , drainage- none, tonsils- atrophic Neck- flexible , trachea midline, no stridor , thyroid nl, carotid no bruit Chest - symmetrical excursion , unlabored           Heart/CV- RRR , no murmur , no gallop  , no rub, nl s1,    +P2 prominent                           - JVD +1 , edema- none, stasis changes- none, varices- none           Lung- +diminished on R, wheeze- none, cough - none , dullness-none, rub- none,                 Chest wall- + 2 cm rubbery somewhat mobile subcutaneous nodule at distal end of left clavicle- probable lipoma Abd- no HSM Br/ Gen/ Rectal- Not done, not indicated Extrem- cyanosis- none, clubbing, none, atrophy- none, strength- nl, +1 cm rubbery nodule at base R thumb Neuro- grossly intact to observation Assessment & Plan:

## 2020-04-05 DIAGNOSIS — J9621 Acute and chronic respiratory failure with hypoxia: Secondary | ICD-10-CM | POA: Diagnosis not present

## 2020-04-06 ENCOUNTER — Ambulatory Visit (INDEPENDENT_AMBULATORY_CARE_PROVIDER_SITE_OTHER): Payer: Medicare HMO

## 2020-04-06 ENCOUNTER — Other Ambulatory Visit: Payer: Self-pay

## 2020-04-06 ENCOUNTER — Encounter: Payer: Self-pay | Admitting: Internal Medicine

## 2020-04-06 ENCOUNTER — Ambulatory Visit: Payer: Medicare HMO | Admitting: Internal Medicine

## 2020-04-06 VITALS — BP 110/80 | HR 72 | Temp 97.6°F | Ht 62.0 in | Wt 141.6 lb

## 2020-04-06 DIAGNOSIS — J9611 Chronic respiratory failure with hypoxia: Secondary | ICD-10-CM

## 2020-04-06 DIAGNOSIS — Z23 Encounter for immunization: Secondary | ICD-10-CM

## 2020-04-06 DIAGNOSIS — D869 Sarcoidosis, unspecified: Secondary | ICD-10-CM

## 2020-04-06 MED ORDER — TRELEGY ELLIPTA 100-62.5-25 MCG/INH IN AEPB
1.0000 | INHALATION_SPRAY | Freq: Every day | RESPIRATORY_TRACT | 0 refills | Status: DC
Start: 2020-04-06 — End: 2020-12-03

## 2020-04-06 NOTE — Assessment & Plan Note (Addendum)
Remains dependent on O2 Plan- continue 1.5-2L as she is doing, 24/7.  Update pneumovax-23

## 2020-04-06 NOTE — Assessment & Plan Note (Addendum)
Cutaneous nodules and plaques, and occular sarcoid, but no evident active pulmonary sarcoid. Symptoms c/w bronchitis.  Plan- ACE level, trial of Trelegy samples,

## 2020-04-06 NOTE — Patient Instructions (Addendum)
Order- CXR-   Dx Sarcoid  Order- lab- Angiotensin Converting Enzyme level   Dx Sarcoid  Order- Pneumovax-23       Dx Sarcoid   Sample x 2  Trelegy 100 inhaler    Inhale 1 puff then rinse mouth, once daily See if this helps with the morning cough and phlegm  Please call if we can help

## 2020-04-08 ENCOUNTER — Telehealth: Payer: Self-pay

## 2020-04-08 LAB — ANGIOTENSIN CONVERTING ENZYME: Angiotensin-Converting Enzyme: 50 U/L (ref 9–67)

## 2020-04-08 NOTE — Telephone Encounter (Signed)
Ok to try [employer]. Build up activity gradually, with emphasis on aerobics- walking, exercycle, treadmill. Avoid straining/ weight lifting for now.

## 2020-04-08 NOTE — Telephone Encounter (Signed)
Called patient for another reason, while on the phone pt asked about Silver [employer]neakers and her activity level.  States that she is trying to join a gym, and wants to know CY's recs on what activities are appropriate for her to do at the gym.  CY please advise, thanks!

## 2020-04-09 NOTE — Telephone Encounter (Signed)
Called and spoke to pt. Informed her of the recs per CY. Pt verbalized understanding and denied any further questions or concerns at this time.   

## 2020-04-10 ENCOUNTER — Telehealth: Payer: Self-pay | Admitting: Internal Medicine

## 2020-04-10 DIAGNOSIS — Z1231 Encounter for screening mammogram for malignant neoplasm of breast: Secondary | ICD-10-CM | POA: Diagnosis not present

## 2020-04-11 DIAGNOSIS — J9621 Acute and chronic respiratory failure with hypoxia: Secondary | ICD-10-CM | POA: Diagnosis not present

## 2020-04-13 NOTE — Progress Notes (Signed)
Cardiology Office Note:    Date:  04/14/2020   ID:  Lauren Massey, DOB 10-04-1955, MRN [id number]  PCP:  Hoyt Koch, MD  Cardiologist:  Donato Heinz, MD  Electrophysiologist:  None   Referring MD: Hoyt Koch, *   Chief Complaint  Patient presents with  . Palpitations    History of Present Illness:    Lauren Massey is a 65 y.o. female with a hx of pulmonary sarcoidosis, hypertension who is being seen today for follow-up.  She was admitted to [facility] for VATS/robotic pleurectomy due to recurrent pneumothoraces in 02/2019.  Cardiology was consulted for preop evaluation and consideration of cardiac sarcoid eval.  TTE showed normal LV systolic function, normal RV function, mild PASP elevation.  No further cardiac work-up was recommended prior to surgery.  Outpatient follow-up for cardiac MRI was recommended.  She tolerated her procedure well, postoperative course was uncomplicated.  Cardiac MRI was done on 04/05/2019, which showed normal LV systolic function (EF 09%), normal RV systolic function (EF 47%).  Inferior RV insertion site LGE consistent with elevated pulmonary pressures, no evidence of cardiac sarcoid.  Since last clinic visit, she reports she has been doing okay.  Now on oxygen.  States that she feels like her shortness of breath is stable.  Denies any chest pain.  Denies any lightheadedness or syncope.  Reports she has been having episodes of palpitations that she describes as feeling like her heart is fluttering.  Feels like heart is beating fast and irregular.  Sometimes lightheaded during episodes.  Has been occurring 1-2 times per month.  Has woke up during the night during these episodes.  Typically last for around 5 minutes and resolves.  Also reports has been having muscle cramps in her lower extremities.    Past Medical History:  Diagnosis Date  . Cataract    right eye  . Pneumothorax   . Sarcoidosis    skin, eye, lung  . Unspecified  iridocyclitis     Past Surgical History:  Procedure Laterality Date  . ABDOMINAL HYSTERECTOMY    . APPENDECTOMY    . BRONCHOSCOPY  2003  . CATARACT EXTRACTION    . INTERCOSTAL NERVE BLOCK Right 03/12/2019   Procedure: Intercostal Nerve Block;  Surgeon: Grace Isaac, MD;  Location: [place];  Service: Thoracic;  Laterality: Right;  . PLEURADESIS Right 08/23/2019   Procedure: MECHANICAL PLEURODESIS;  Surgeon: Grace Isaac, MD;  Location: [place];  Service: Thoracic;  Laterality: Right;  . TALC PLEURODESIS Right 08/23/2019   Procedure: Pietro Cassis;  Surgeon: Grace Isaac, MD;  Location: [place];  Service: Thoracic;  Laterality: Right;  Marland Kitchen VESICOVAGINAL FISTULA CLOSURE W/ TAH    . VIDEO ASSISTED THORACOSCOPY Right 08/23/2019   Procedure: VIDEO ASSISTED THORACOSCOPY;  Surgeon: Grace Isaac, MD;  Location: [place];  Service: Thoracic;  Laterality: Right;  Marland Kitchen VIDEO BRONCHOSCOPY N/A 03/12/2019   Procedure: Video Bronchoscopy;  Surgeon: Grace Isaac, MD;  Location: [facility] OR;  Service: Thoracic;  Laterality: N/A;    Current Medications: Current Meds  Medication Sig  . albuterol (VENTOLIN HFA) 108 (90 Base) MCG/ACT inhaler Inhale 2 puffs into the lungs every 6 (six) hours as needed for wheezing or shortness of breath.  . Fluticasone-Umeclidin-Vilant (TRELEGY ELLIPTA) 100-62.5-25 MCG/INH AEPB Inhale 1 puff into the lungs daily.  . folic acid (FOLVITE) 1 MG tablet Take 5 mg by mouth at bedtime.   . hydrochlorothiazide (MICROZIDE) 12.5 MG capsule Take 1 capsule (  12.5 mg total) by mouth daily.  . methotrexate (RHEUMATREX) 2.5 MG tablet Take 2.5 mg by mouth once a week. Caution:Chemotherapy. Protect from light. Takes 3 tabs a week  . prednisoLONE acetate (PRED FORTE) 1 % ophthalmic suspension Place 1 drop into the right eye 2 (two) times daily. Reported on 04/16/2015 (Patient taking differently: Place 1 drop into the right eye 4 (four) times daily. Reported on 04/16/2015)      Allergies:   Patient has no known allergies.   Social History   Socioeconomic History  . Marital status: Married    Spouse name: Not on file  . Number of children: 3  . Years of education: Not on file  . Highest education level: Not on file  Occupational History  . 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 Smoking status: Never Smoker  . Smokeless tobacco: Never Used  Vaping Use  . Vaping Use: Never used  Substance and Sexual Activity  . Alcohol use: No  . Drug use: No  . Sexual activity: Not on file  Other Topics Concern  . Not on file  Social History Narrative  . Not on file   Social Determinants of Health   Financial Resource Strain: Not on file  Food Insecurity: Not on file  Transportation Needs: Not on file  Physical Activity: Not on file  Stress: Not on file  Social Connections: Not on file     Family History: The patient's family history includes Alzheimer's disease in her mother; Cancer in her father; Diabetes in an other family member; Heart disease in her father. There is no history of Colon cancer, Esophageal cancer, Pancreatic cancer, or Stomach cancer.  ROS:   Please see the history of present illness.     All other systems reviewed and are negative.  EKGs/Labs/Other Studies Reviewed:    The following studies were reviewed today:   EKG:  EKG is ordered today.  EKG ordered today shows normal sinus rhythm, rate 68, no ST abnormalities  Recent Labs: 07/20/2019: Magnesium 2.0 08/25/2019: ALT 30; BUN 6; Creatinine, Ser 0.63; Hemoglobin 10.6; Platelets 154; Potassium 3.8; Sodium 133  Recent Lipid Panel    Component Value Date/Time   CHOL 233 (A) 11/17/2018 0000   TRIG 106 11/17/2018 0000   HDL 77 (A) 11/17/2018 0000   CHOLHDL 4 02/05/2018 1010   VLDL 18.2 02/05/2018 1010   LDLCALC 18 11/17/2018 0000   TTE 03/08/19: 1. Left ventricular ejection fraction, by estimation, is 60 to 65%. The  left ventricle has normal  function. The left ventricle has no regional  wall motion abnormalities. Left ventricular diastolic parameters were  normal.  2. Right ventricular systolic function is normal. The right ventricular  size is normal. There is mildly elevated pulmonary artery systolic  pressure.  3. The mitral valve is normal in structure and function. Trivial mitral  valve regurgitation. No evidence of mitral stenosis.  4. The aortic valve is tricuspid. Aortic valve regurgitation is not  visualized. Mild aortic valve sclerosis is present, with no evidence of  aortic valve stenosis.   CMR 04/07/19: 1. Normal biventricular chamber size and systolic function. LVEF 68%, RVEF 53%.  2. Possible small focus of late gadolinium enhancement in the left ventricular myocardium at the inferior RV insertion point at the mid ventricle. This finding can be seen with increased pulmonary pressures.  3.  No definite findings suggestive of cardiac sarcoidosis.   Physical Exam:    VS:  BP 132/76   Pulse 68   Ht 5\' 2"  (1.575 m)   Wt 140 lb 9.6 oz (63.8 kg)   SpO2 98%   BMI 25.72 kg/m     Wt Readings from Last 3 Encounters:  04/14/20 140 lb 9.6 oz (63.8 kg)  04/06/20 141 lb 9.6 oz (64.2 kg)  01/02/20 134 lb (60.8 kg)     GEN:  in no acute distress HEENT: Normal NECK: No JVD CARDIAC: RRR, no murmurs, rubs, gallops RESPIRATORY:  Clear to auscultation without rales, wheezing or rhonchi  ABDOMEN: Soft, non-tender, non-distended MUSCULOSKELETAL:  No edema; No deformity  SKIN: Warm and dry NEUROLOGIC:  Alert and oriented x 3 PSYCHIATRIC:  Normal affect   ASSESSMENT:    1. Palpitations   2. Sarcoidosis   3. Essential hypertension   4. Chest pain of uncertain etiology    PLAN:     Palpitations: Description concerning for arrhythmia, evaluate with Zio patch x2 weeks  Sarcoidosis: Known pulmonary sarcoid, cardiac MRI 04/05/19 shows no evidence of cardiac sarcoid.  Chest pain: Description suggest  noncardiac chest pain, as describes sharp pain lasting few seconds and resolves.  No further cardiac work-up recommended at this time  Hypertension: On hydrochlorothiazide 12.5 mg daily.  Appears controlled.  Reports having muscle cramps, will check electrolytes  RTC in 1 year  Medication Adjustments/Labs and Tests Ordered: Current medicines are reviewed at length with the patient today.  Concerns regarding medicines are outlined above.  Orders Placed This Encounter  Procedures  . Comprehensive metabolic panel  . CBC  . Magnesium  . TSH  . LONG TERM MONITOR (3-14 DAYS)  . EKG 12-Lead   No orders of the defined types were placed in this encounter.   Patient Instructions  Medication Instructions:  Your physician recommends that you continue on your current medications as directed. Please refer to the Current Medication list given to you today.  *If you need a refill on your cardiac medications before your next appointment, please call your pharmacy*   Lab Work: Please return for labs  (CMET, CBC, Mag, TSH)  Our in office lab hours are Monday-Friday 8:00-4:00, closed for lunch 12:45-1:45 pm.  No appointment needed.  Testing/Procedures:  Bryn Gulling- Long Term Monitor Instructions   Your physician has requested you wear your ZIO patch monitor___14____days.   This is a single patch monitor.  [employer] supplies one patch monitor per enrollment.  Additional stickers are not available.   Please do not apply patch if you will be having a Nuclear Stress Test, Echocardiogram, Cardiac CT, MRI, or Chest Xray during the time frame you would be wearing the monitor. The patch cannot be worn during these tests.  You cannot remove and re-apply the ZIO XT patch monitor.   Your ZIO patch monitor will be sent USPS Priority mail from [facility] directly to your home address. The monitor may also be mailed to a PO BOX if home delivery is not available.   It may take 3-5 days to receive your  monitor after you have been enrolled.   Once you have received you monitor, please review enclosed instructions.  Your monitor has already been registered assigning a specific monitor serial # to you.   Applying the monitor   Shave hair from upper left chest.   Hold abrader disc by orange tab.  Rub abrader in 40 strokes over left upper chest as indicated in your monitor instructions.   Clean area with 4 enclosed alcohol pads .  Use all pads to assure are is cleaned thoroughly.  Let dry.   Apply patch as indicated in monitor instructions.  Patch will be place under collarbone on left side of chest with arrow pointing upward.   Rub patch adhesive wings for 2 minutes.Remove white label marked "1".  Remove white label marked "2".  Rub patch adhesive wings for 2 additional minutes.   While looking in a mirror, press and release button in center of patch.  A small green light will flash 3-4 times .  This will be your only indicator the monitor has been turned on.     Do not shower for the first 24 hours.  You may shower after the first 24 hours.   Press button if you feel a symptom. You will hear a small click.  Record Date, Time and Symptom in the Patient Log Book.   When you are ready to remove patch, follow instructions on last 2 pages of Patient Log Book.  Stick patch monitor onto last page of Patient Log Book.   Place Patient Log Book in [place] box.  Use locking tab on box and tape box closed securely.  The [employer] and [employer] has [employer] on it.  Please place in mailbox as soon as possible.  Your physician should have your test results approximately 7 days after the monitor has been mailed back to [facility].   Call [place] at [phone] if you have questions regarding your ZIO XT patch monitor.  Call them immediately if you see an orange light blinking on your monitor.   If your monitor falls off in less than 4 days contact our Monitor department at  [phone].  If your monitor becomes loose or falls off after 4 days call [employer] at [phone] for suggestions on securing your monitor.    Follow-Up: At [facility], you and your health needs are our priority.  As part of our continuing mission to provide you with exceptional heart care, we have created designated Provider Care Teams.  These Care Teams include your primary Cardiologist (physician) and Advanced Practice Providers (APPs -  Physician Assistants and Nurse Practitioners) who all work together to provide you with the care you need, when you need it.  We recommend signing up for the patient portal called "MyChart".  Sign up information is provided on this After Visit Summary.  MyChart is used to connect with patients for Virtual Visits (Telemedicine).  Patients are able to view lab/test results, encounter notes, upcoming appointments, etc.  Non-urgent messages can be sent to your provider as well.   To learn more about what you can do with MyChart, go to [email or web address].    Your next appointment:   12 month(s)  The format for your next appointment:   In Person  Provider:   Oswaldo Milian, MD         Signed, Donato Heinz, MD  04/14/2020 10:11 PM    [place]

## 2020-04-14 ENCOUNTER — Encounter: Payer: Self-pay | Admitting: Cardiology

## 2020-04-14 ENCOUNTER — Encounter: Payer: Self-pay | Admitting: Radiology

## 2020-04-14 ENCOUNTER — Ambulatory Visit (INDEPENDENT_AMBULATORY_CARE_PROVIDER_SITE_OTHER): Payer: Medicare HMO

## 2020-04-14 ENCOUNTER — Other Ambulatory Visit: Payer: Self-pay

## 2020-04-14 ENCOUNTER — Ambulatory Visit: Payer: Medicare HMO | Admitting: Cardiology

## 2020-04-14 VITALS — BP 132/76 | HR 68 | Ht 62.0 in | Wt 140.6 lb

## 2020-04-14 DIAGNOSIS — R002 Palpitations: Secondary | ICD-10-CM | POA: Diagnosis not present

## 2020-04-14 DIAGNOSIS — I10 Essential (primary) hypertension: Secondary | ICD-10-CM | POA: Diagnosis not present

## 2020-04-14 DIAGNOSIS — R079 Chest pain, unspecified: Secondary | ICD-10-CM

## 2020-04-14 DIAGNOSIS — D869 Sarcoidosis, unspecified: Secondary | ICD-10-CM

## 2020-04-14 NOTE — Progress Notes (Signed)
Enrolled patient for a 14 day Zio XT Monitor to be mailed to patients home  

## 2020-04-14 NOTE — Patient Instructions (Signed)
Medication Instructions:  Your physician recommends that you continue on your current medications as directed. Please refer to the Current Medication list given to you today.  *If you need a refill on your cardiac medications before your next appointment, please call your pharmacy*   Lab Work: Please return for labs  (CMET, CBC, Mag, TSH)  Our in office lab hours are Monday-Friday 8:00-4:00, closed for lunch 12:45-1:45 pm.  No appointment needed.  Testing/Procedures:  Bryn Gulling- Long Term Monitor Instructions   Your physician has requested you wear your ZIO patch monitor___14____days.   This is a single patch monitor.  [employer] supplies one patch monitor per enrollment.  Additional stickers are not available.   Please do not apply patch if you will be having a Nuclear Stress Test, Echocardiogram, Cardiac CT, MRI, or Chest Xray during the time frame you would be wearing the monitor. The patch cannot be worn during these tests.  You cannot remove and re-apply the ZIO XT patch monitor.   Your ZIO patch monitor will be sent USPS Priority mail from [facility] directly to your home address. The monitor may also be mailed to a PO BOX if home delivery is not available.   It may take 3-5 days to receive your monitor after you have been enrolled.   Once you have received you monitor, please review enclosed instructions.  Your monitor has already been registered assigning a specific monitor serial # to you.   Applying the monitor   Shave hair from upper left chest.   Hold abrader disc by orange tab.  Rub abrader in 40 strokes over left upper chest as indicated in your monitor instructions.   Clean area with 4 enclosed alcohol pads .  Use all pads to assure are is cleaned thoroughly.  Let dry.   Apply patch as indicated in monitor instructions.  Patch will be place under collarbone on left side of chest with arrow pointing upward.   Rub patch adhesive wings for 2 minutes.Remove white  label marked "1".  Remove white label marked "2".  Rub patch adhesive wings for 2 additional minutes.   While looking in a mirror, press and release button in center of patch.  A small green light will flash 3-4 times .  This will be your only indicator the monitor has been turned on.     Do not shower for the first 24 hours.  You may shower after the first 24 hours.   Press button if you feel a symptom. You will hear a small click.  Record Date, Time and Symptom in the Patient Log Book.   When you are ready to remove patch, follow instructions on last 2 pages of Patient Log Book.  Stick patch monitor onto last page of Patient Log Book.   Place Patient Log Book in [place] box.  Use locking tab on box and tape box closed securely.  The [employer] has [employer] on it.  Please place in mailbox as soon as possible.  Your physician should have your test results approximately 7 days after the monitor has been mailed back to [facility].   Call [place] at [phone] if you have questions regarding your ZIO XT patch monitor.  Call them immediately if you see an orange light blinking on your monitor.   If your monitor falls off in less than 4 days contact our Monitor department at [phone].  If your monitor becomes loose or falls off after 4  days call [employer] at [phone] for suggestions on securing your monitor.    Follow-Up: At [facility], you and your health needs are our priority.  As part of our continuing mission to provide you with exceptional heart care, we have created designated Provider Care Teams.  These Care Teams include your primary Cardiologist (physician) and Advanced Practice Providers (APPs -  Physician Assistants and Nurse Practitioners) who all work together to provide you with the care you need, when you need it.  We recommend signing up for the patient portal called "MyChart".  Sign up information is provided on this After Visit  Summary.  MyChart is used to connect with patients for Virtual Visits (Telemedicine).  Patients are able to view lab/test results, encounter notes, upcoming appointments, etc.  Non-urgent messages can be sent to your provider as well.   To learn more about what you can do with MyChart, go to [email or web address].    Your next appointment:   12 month(s)  The format for your next appointment:   In Person  Provider:   Oswaldo Milian, MD

## 2020-04-15 ENCOUNTER — Telehealth: Payer: Self-pay | Admitting: Gastroenterology

## 2020-04-15 NOTE — Telephone Encounter (Signed)
Pt needs to r/s her colon at the hospital. Pls call her.

## 2020-04-15 NOTE — Telephone Encounter (Signed)
Rec'd forms back from Dr. Annamaria Boots - Faxed to [place] at [phone] -pr

## 2020-04-16 DIAGNOSIS — I10 Essential (primary) hypertension: Secondary | ICD-10-CM | POA: Diagnosis not present

## 2020-04-16 DIAGNOSIS — D869 Sarcoidosis, unspecified: Secondary | ICD-10-CM | POA: Diagnosis not present

## 2020-04-16 DIAGNOSIS — R002 Palpitations: Secondary | ICD-10-CM | POA: Diagnosis not present

## 2020-04-16 LAB — COMPREHENSIVE METABOLIC PANEL
ALT: 16 IU/L (ref 0–32)
AST: 38 IU/L (ref 0–40)
Albumin/Globulin Ratio: 0.7 — ABNORMAL LOW (ref 1.2–2.2)
Albumin: 4.1 g/dL (ref 3.8–4.8)
Alkaline Phosphatase: 66 IU/L (ref 44–121)
BUN/Creatinine Ratio: 19 (ref 12–28)
BUN: 18 mg/dL (ref 8–27)
Bilirubin Total: 0.6 mg/dL (ref 0.0–1.2)
CO2: 24 mmol/L (ref 20–29)
Calcium: 9.9 mg/dL (ref 8.7–10.3)
Chloride: 97 mmol/L (ref 96–106)
Creatinine, Ser: 0.93 mg/dL (ref 0.57–1.00)
Globulin, Total: 5.5 g/dL — ABNORMAL HIGH (ref 1.5–4.5)
Glucose: 92 mg/dL (ref 65–99)
Potassium: 4.2 mmol/L (ref 3.5–5.2)
Sodium: 136 mmol/L (ref 134–144)
Total Protein: 9.6 g/dL — ABNORMAL HIGH (ref 6.0–8.5)
eGFR: 68 mL/min/{1.73_m2} (ref >59)

## 2020-04-16 LAB — TSH: TSH: 1.12 u[IU]/mL (ref 0.450–4.500)

## 2020-04-16 LAB — CBC
Hematocrit: 37.1 % (ref 34.0–46.6)
Hemoglobin: 12.1 g/dL (ref 11.1–15.9)
MCH: 27.7 pg (ref 26.6–33.0)
MCHC: 32.6 g/dL (ref 31.5–35.7)
MCV: 85 fL (ref 79–97)
Platelets: 181 10*3/uL (ref 150–450)
RBC: 4.37 x10E6/uL (ref 3.77–5.28)
RDW: 12.9 % (ref 11.7–15.4)
WBC: 3.8 10*3/uL (ref 3.4–10.8)

## 2020-04-16 LAB — MAGNESIUM: Magnesium: 2.2 mg/dL (ref 1.6–2.3)

## 2020-04-18 DIAGNOSIS — R002 Palpitations: Secondary | ICD-10-CM | POA: Diagnosis not present

## 2020-04-20 ENCOUNTER — Other Ambulatory Visit: Payer: Self-pay | Admitting: *Deleted

## 2020-04-20 DIAGNOSIS — R778 Other specified abnormalities of plasma proteins: Secondary | ICD-10-CM

## 2020-04-20 DIAGNOSIS — R771 Abnormality of globulin: Secondary | ICD-10-CM

## 2020-04-20 NOTE — Telephone Encounter (Signed)
Patient has had resp failure and pneumothorax recently.  She will come in and see Dr. Silverio Decamp on 04/22/20 3:00 to discuss rescheduling hospital colonoscopy .

## 2020-04-22 ENCOUNTER — Other Ambulatory Visit: Payer: Self-pay

## 2020-04-22 ENCOUNTER — Ambulatory Visit: Payer: Medicare HMO | Admitting: Gastroenterology

## 2020-04-22 ENCOUNTER — Telehealth: Payer: Self-pay | Admitting: Internal Medicine

## 2020-04-22 ENCOUNTER — Encounter: Payer: Self-pay | Admitting: Gastroenterology

## 2020-04-22 VITALS — BP 138/72 | HR 68 | Ht 62.0 in | Wt 139.0 lb

## 2020-04-22 DIAGNOSIS — Z86010 Personal history of colonic polyps: Secondary | ICD-10-CM

## 2020-04-22 DIAGNOSIS — Z9981 Dependence on supplemental oxygen: Secondary | ICD-10-CM | POA: Diagnosis not present

## 2020-04-22 MED ORDER — ONDANSETRON HCL 4 MG PO TABS: ORAL_TABLET | ORAL | 0 refills | Status: DC

## 2020-04-22 MED ORDER — SUPREP BOWEL PREP KIT 17.5-3.13-1.6 GM/177ML PO SOLN: 1.0000 | Freq: Once | ORAL | 0 refills | Status: AC

## 2020-04-22 NOTE — Patient Instructions (Signed)
You have been scheduled for a colonoscopy at [facility] on 05/21/2020 at 10:45am, Separate instructions have been given  We have sent Suprep and Zofran to your local pharmacy  Due to recent changes in healthcare laws, you may see the results of your imaging and laboratory studies on MyChart before your provider has had a chance to review them.  We understand that in some cases there may be results that are confusing or concerning to you. Not all laboratory results come back in the same time frame and the provider may be waiting for multiple results in order to interpret others.  Please give [place] 48 hours in order for your provider to thoroughly review all the results before contacting the office for clarification of your results.   Thank you for choosing [facility]  Karleen Hampshire Nandigam,MD

## 2020-04-22 NOTE — H&P (View-Only) (Signed)
Lauren Massey    [id number]    1955-05-15  Primary Care Physician:Crawford, Real Cons, MD  Referring Physician: Hoyt Koch, MD [address] [place],  [place] [address]   Chief complaint:  H/o colon polyps  HPI: 65 year old woman very pleasant female with pulmonary sarcoidosis, recurrent pneumothorax hypoxia on home O2 here to discuss surveillance colonoscopy  She is s/p pleurodesis for recurrent pneumothorax.  She has not had any episodes since then.  Overall she is doing better  She uses 2 L home O2 only when she sleeps and when she is exerting herself other wise denies any dyspnea  or chest pain.  She had palpitations, was evaluated by cardiology and has a event monitor for 14 days.  Denies any recurrent episodes of palpitations or dizziness.  Colonoscopy February 19, 2016: 2 pedunculated polyps 9 to 14 mm in size removed with hot snare in the sigmoid colon.  Rectal polyp consistent with nonnecrotizing granuloma, sarcoidosis.  Internal hemorrhoids  Denies any nausea, vomiting, abdominal pain, melena or bright red blood per rectum She did not tolerate  Bowel prep with Sutabs last year but prior to that she did well with Suprep      Outpatient Encounter Medications as of 04/22/2020  Medication Sig  . Fluticasone-Umeclidin-Vilant (TRELEGY ELLIPTA) 100-62.5-25 MCG/INH AEPB Inhale 1 puff into the lungs daily.  . folic acid (FOLVITE) 1 MG tablet Take 5 mg by mouth at bedtime.   . hydrochlorothiazide (MICROZIDE) 12.5 MG capsule Take 1 capsule (12.5 mg total) by mouth daily.  . methotrexate (RHEUMATREX) 2.5 MG tablet Take 2.5 mg by mouth once a week. Caution:Chemotherapy. Protect from light. Takes 3 tabs a week  . Na Sulfate-K Sulfate-Mg Sulf (SUPREP BOWEL PREP KIT) 17.5-3.13-1.6 GM/177ML SOLN Take 1 kit by mouth once for 1 dose.  . ondansetron (ZOFRAN) 4 MG tablet To use as directed with colonoscopy prep  . prednisoLONE acetate (PRED FORTE) 1 %  ophthalmic suspension Place 1 drop into the right eye 2 (two) times daily. Reported on 04/16/2015 (Patient taking differently: Place 1 drop into the right eye 4 (four) times daily. Reported on 04/16/2015)  . albuterol (VENTOLIN HFA) 108 (90 Base) MCG/ACT inhaler Inhale 2 puffs into the lungs every 6 (six) hours as needed for wheezing or shortness of breath. (Patient not taking: Reported on 04/22/2020)   No facility-administered encounter medications on file as of 04/22/2020.    Allergies as of 04/22/2020  . (No Known Allergies)    Past Medical History:  Diagnosis Date  . Cataract    right eye  . Pneumothorax   . Sarcoidosis    skin, eye, lung  . Unspecified iridocyclitis     Past Surgical History:  Procedure Laterality Date  . ABDOMINAL HYSTERECTOMY    . APPENDECTOMY    . BRONCHOSCOPY  2003  . CATARACT EXTRACTION    . INTERCOSTAL NERVE BLOCK Right 03/12/2019   Procedure: Intercostal Nerve Block;  Surgeon: Grace Isaac, MD;  Location: [facility];  Service: Thoracic;  Laterality: Right;  . PLEURADESIS Right 08/23/2019   Procedure: MECHANICAL PLEURODESIS;  Surgeon: Grace Isaac, MD;  Location: [facility];  Service: Thoracic;  Laterality: Right;  . TALC PLEURODESIS Right 08/23/2019   Procedure: Pietro Cassis;  Surgeon: Grace Isaac, MD;  Location: [facility];  Service: Thoracic;  Laterality: Right;  Marland Kitchen VESICOVAGINAL FISTULA CLOSURE W/ TAH    . VIDEO ASSISTED THORACOSCOPY Right 08/23/2019   Procedure:  VIDEO ASSISTED THORACOSCOPY;  Surgeon: Grace Isaac, MD;  Location: [facility];  Service: Thoracic;  Laterality: Right;  Marland Kitchen VIDEO BRONCHOSCOPY N/A 03/12/2019   Procedure: Video Bronchoscopy;  Surgeon: Grace Isaac, MD;  Location: [facility] OR;  Service: Thoracic;  Laterality: N/A;    Family History  Problem Relation Age of Onset  . Alzheimer's disease Mother   . Cancer Father   . Heart disease Father   . Diabetes Other   . Colon cancer Neg Hx   . Esophageal cancer Neg Hx   .  Pancreatic cancer Neg Hx   . Stomach cancer Neg Hx     Social History   Socioeconomic History  . Marital status: Married    Spouse name: Not on file  . Number of children: 3  . Years of education: Not on file  . Highest education level: Not on file  Occupational History  . 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 Smoking status: Never Smoker  . Smokeless tobacco: Never Used  Vaping Use  . Vaping Use: Never used  Substance and Sexual Activity  . Alcohol use: No  . Drug use: No  . Sexual activity: Not on file  Other Topics Concern  . Not on file  Social History Narrative  . Not on file   Social Determinants of Health   Financial Resource Strain: Not on file  Food Insecurity: Not on file  Transportation Needs: Not on file  Physical Activity: Not on file  Stress: Not on file  Social Connections: Not on file  Intimate Partner Violence: Not on file      Review of systems: All other review of systems negative except as mentioned in the HPI.   Physical Exam: Vitals:   04/22/20 1455  BP: 138/72  Pulse: 68   Body mass index is 25.42 kg/m. Gen:      No acute distress HEENT:  sclera anicteric Abd:      soft, non-tender; no palpable masses, no distension Ext:    No edema Neuro: alert and oriented x 3 Psych: normal mood and affect  Data Reviewed:  Reviewed labs, radiology imaging, old records and pertinent past GI work up   Assessment and Plan/Recommendations:  65 year old very pleasant female with history of sarcoidosis, recurrent pneumothorax on home oxygen  She is overall doing better after pleurodesis  She is due for surveillance colonoscopy due to history of large adenomatous colon polyps, will schedule it The risks and benefits as well as alternatives of endoscopic procedure(s) have been discussed and reviewed. All questions answered. The patient agrees to proceed.    The patient was provided an opportunity to ask  questions and all were answered. The patient agreed with the plan and demonstrated an understanding of the instructions.  Damaris Hippo , MD    CC: Hoyt Koch, *

## 2020-04-22 NOTE — Progress Notes (Signed)
Lauren Massey    [id number]    [birth year]  Primary Care Physician:Crawford, Real Cons, MD  Referring Physician: Hoyt Koch, MD [address] [place],  [place] [address]   Chief complaint:  H/o colon polyps  HPI: 65 year old woman very pleasant female with pulmonary sarcoidosis, recurrent pneumothorax hypoxia on home O2 here to discuss surveillance colonoscopy  She is s/p pleurodesis for recurrent pneumothorax.  She has not had any episodes since then.  Overall she is doing better  She uses 2 L home O2 only when she sleeps and when she is exerting herself other wise denies any dyspnea  or chest pain.  She had palpitations, was evaluated by cardiology and has a event monitor for 14 days.  Denies any recurrent episodes of palpitations or dizziness.  Colonoscopy February 19, 2016: 2 pedunculated polyps 9 to 14 mm in size removed with hot snare in the sigmoid colon.  Rectal polyp consistent with nonnecrotizing granuloma, sarcoidosis.  Internal hemorrhoids  Denies any nausea, vomiting, abdominal pain, melena or bright red blood per rectum She did not tolerate  Bowel prep with Sutabs last year but prior to that she did well with Suprep      Outpatient Encounter Medications as of 04/22/2020  Medication Sig  . Fluticasone-Umeclidin-Vilant (TRELEGY ELLIPTA) 100-62.5-25 MCG/INH AEPB Inhale 1 puff into the lungs daily.  . folic acid (FOLVITE) 1 MG tablet Take 5 mg by mouth at bedtime.   . hydrochlorothiazide (MICROZIDE) 12.5 MG capsule Take 1 capsule (12.5 mg total) by mouth daily.  . methotrexate (RHEUMATREX) 2.5 MG tablet Take 2.5 mg by mouth once a week. Caution:Chemotherapy. Protect from light. Takes 3 tabs a week  . Na Sulfate-K Sulfate-Mg Sulf (SUPREP BOWEL PREP KIT) 17.5-3.13-1.6 GM/177ML SOLN Take 1 kit by mouth once for 1 dose.  . ondansetron (ZOFRAN) 4 MG tablet To use as directed with colonoscopy prep  . prednisoLONE acetate (PRED FORTE) 1 %  ophthalmic suspension Place 1 drop into the right eye 2 (two) times daily. Reported on 04/16/2015 (Patient taking differently: Place 1 drop into the right eye 4 (four) times daily. Reported on 04/16/2015)  . albuterol (VENTOLIN HFA) 108 (90 Base) MCG/ACT inhaler Inhale 2 puffs into the lungs every 6 (six) hours as needed for wheezing or shortness of breath. (Patient not taking: Reported on 04/22/2020)   No facility-administered encounter medications on file as of 04/22/2020.    Allergies as of 04/22/2020  . (No Known Allergies)    Past Medical History:  Diagnosis Date  . Cataract    right eye  . Pneumothorax   . Sarcoidosis    skin, eye, lung  . Unspecified iridocyclitis     Past Surgical History:  Procedure Laterality Date  . ABDOMINAL HYSTERECTOMY    . APPENDECTOMY    . BRONCHOSCOPY  2003  . CATARACT EXTRACTION    . INTERCOSTAL NERVE BLOCK Right 03/12/2019   Procedure: Intercostal Nerve Block;  Surgeon: Grace Isaac, MD;  Location: [facility];  Service: Thoracic;  Laterality: Right;  . PLEURADESIS Right 08/23/2019   Procedure: MECHANICAL PLEURODESIS;  Surgeon: Grace Isaac, MD;  Location: [facility];  Service: Thoracic;  Laterality: Right;  . TALC PLEURODESIS Right 08/23/2019   Procedure: Pietro Cassis;  Surgeon: Grace Isaac, MD;  Location: [facility];  Service: Thoracic;  Laterality: Right;  Marland Kitchen VESICOVAGINAL FISTULA CLOSURE W/ TAH    . VIDEO ASSISTED THORACOSCOPY Right 08/23/2019   Procedure:  VIDEO ASSISTED THORACOSCOPY;  Surgeon: Grace Isaac, MD;  Location: [facility];  Service: Thoracic;  Laterality: Right;  Marland Kitchen VIDEO BRONCHOSCOPY N/A 03/12/2019   Procedure: Video Bronchoscopy;  Surgeon: Grace Isaac, MD;  Location: [facility] OR;  Service: Thoracic;  Laterality: N/A;    Family History  Problem Relation Age of Onset  . Alzheimer's disease Mother   . Cancer Father   . Heart disease Father   . Diabetes Other   . Colon cancer Neg Hx   . Esophageal cancer Neg Hx   .  Pancreatic cancer Neg Hx   . Stomach cancer Neg Hx     Social History   Socioeconomic History  . Marital status: Married    Spouse name: Not on file  . Number of children: 3  . Years of education: Not on file  . Highest education level: Not on file  Occupational History  . 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 Smoking status: Never Smoker  . Smokeless tobacco: Never Used  Vaping Use  . Vaping Use: Never used  Substance and Sexual Activity  . Alcohol use: No  . Drug use: No  . Sexual activity: Not on file  Other Topics Concern  . Not on file  Social History Narrative  . Not on file   Social Determinants of Health   Financial Resource Strain: Not on file  Food Insecurity: Not on file  Transportation Needs: Not on file  Physical Activity: Not on file  Stress: Not on file  Social Connections: Not on file  Intimate Partner Violence: Not on file      Review of systems: All other review of systems negative except as mentioned in the HPI.   Physical Exam: Vitals:   04/22/20 1455  BP: 138/72  Pulse: 68   Body mass index is 25.42 kg/m. Gen:      No acute distress HEENT:  sclera anicteric Abd:      soft, non-tender; no palpable masses, no distension Ext:    No edema Neuro: alert and oriented x 3 Psych: normal mood and affect  Data Reviewed:  Reviewed labs, radiology imaging, old records and pertinent past GI work up   Assessment and Plan/Recommendations:  65 year old very pleasant female with history of sarcoidosis, recurrent pneumothorax on home oxygen  She is overall doing better after pleurodesis  She is due for surveillance colonoscopy due to history of large adenomatous colon polyps, will schedule it The risks and benefits as well as alternatives of endoscopic procedure(s) have been discussed and reviewed. All questions answered. The patient agrees to proceed.    The patient was provided an opportunity to ask  questions and all were answered. The patient agreed with the plan and demonstrated an understanding of the instructions.  Damaris Hippo , MD    CC: Hoyt Koch, *

## 2020-04-29 MED ORDER — HYDROCHLOROTHIAZIDE 12.5 MG PO CAPS: 12.5000 mg | ORAL_CAPSULE | Freq: Every day | ORAL | 3 refills | Status: DC

## 2020-04-29 NOTE — Telephone Encounter (Signed)
Medication has been sent to the patient's pharmacy.  

## 2020-04-29 NOTE — Addendum Note (Signed)
Addended by: Thomes Cake on: 04/29/2020 02:37 PM   Modules accepted: Orders

## 2020-04-29 NOTE — Telephone Encounter (Signed)
1.Medication Requested: hydrochlorothiazide (MICROZIDE) 12.5 MG capsule    2. Pharmacy (Name, [facility]): [facility], [address] AT [place]  3. On Med List: yes   4. Last Visit with PCP: 12-12-19  5. Next visit date with PCP: n/a    Agent: Please be advised that RX refills may take up to 3 business days. We ask that you follow-up with your pharmacy.

## 2020-05-06 DIAGNOSIS — J9621 Acute and chronic respiratory failure with hypoxia: Secondary | ICD-10-CM | POA: Diagnosis not present

## 2020-05-07 DIAGNOSIS — R002 Palpitations: Secondary | ICD-10-CM | POA: Diagnosis not present

## 2020-05-12 DIAGNOSIS — J9621 Acute and chronic respiratory failure with hypoxia: Secondary | ICD-10-CM | POA: Diagnosis not present

## 2020-05-15 ENCOUNTER — Encounter (HOSPITAL_COMMUNITY): Payer: Self-pay | Admitting: Gastroenterology

## 2020-05-15 ENCOUNTER — Other Ambulatory Visit: Payer: Self-pay

## 2020-05-18 ENCOUNTER — Other Ambulatory Visit (HOSPITAL_COMMUNITY)
Admission: RE | Admit: 2020-05-18 | Discharge: 2020-05-18 | Disposition: A | Discharge status: Home / Self Care | Payer: Medicare HMO | Source: Ambulatory Visit | Attending: Gastroenterology | Admitting: Gastroenterology

## 2020-05-18 DIAGNOSIS — Z01812 Encounter for preprocedural laboratory examination: Secondary | ICD-10-CM | POA: Diagnosis not present

## 2020-05-18 DIAGNOSIS — Z20822 Contact with and (suspected) exposure to covid-19: Secondary | ICD-10-CM | POA: Diagnosis not present

## 2020-05-19 LAB — SARS CORONAVIRUS 2 (TAT 6-24 HRS): SARS Coronavirus 2: NEGATIVE

## 2020-05-20 ENCOUNTER — Encounter (HOSPITAL_COMMUNITY): Payer: Self-pay | Admitting: Gastroenterology

## 2020-05-20 NOTE — Anesthesia Preprocedure Evaluation (Addendum)
Anesthesia Evaluation  Patient identified by MRN, date of birth, ID band Patient awake    Reviewed: Allergy & Precautions, NPO status , Patient's Chart, lab work & pertinent test results  Airway Mallampati: III  TM Distance: >3 FB Neck ROM: Full    Dental  (+) Teeth Intact, Dental Advisory Given, Partial Upper, Partial Lower   Pulmonary neg pulmonary ROS,    breath sounds clear to auscultation       Cardiovascular hypertension, Pt. on medications  Rhythm:Regular Rate:Normal     Neuro/Psych negative neurological ROS  negative psych ROS   GI/Hepatic negative GI ROS, Neg liver ROS,   Endo/Other  negative endocrine ROS  Renal/GU negative Renal ROS     Musculoskeletal negative musculoskeletal ROS (+)   Abdominal Normal abdominal exam  (+)   Peds  Hematology negative hematology ROS (+)   Anesthesia Other Findings   Reproductive/Obstetrics                           Anesthesia Physical Anesthesia Plan  ASA: II  Anesthesia Plan: MAC   Post-op Pain Management:    Induction: Intravenous  PONV Risk Score and Plan: 0 and Propofol infusion  Airway Management Planned: Natural Airway and Simple Face Mask  Additional Equipment: None  Intra-op Plan:   Post-operative Plan:   Informed Consent: I have reviewed the patients History and Physical, chart, labs and discussed the procedure including the risks, benefits and alternatives for the proposed anesthesia with the patient or authorized representative who has indicated his/her understanding and acceptance.       Plan Discussed with: CRNA  Anesthesia Plan Comments:        Anesthesia Quick Evaluation

## 2020-05-21 ENCOUNTER — Encounter (HOSPITAL_COMMUNITY): Payer: Self-pay | Admitting: Gastroenterology

## 2020-05-21 ENCOUNTER — Other Ambulatory Visit: Payer: Self-pay

## 2020-05-21 ENCOUNTER — Ambulatory Visit (HOSPITAL_COMMUNITY): Payer: Medicare HMO | Admitting: Anesthesiology

## 2020-05-21 ENCOUNTER — Encounter (HOSPITAL_COMMUNITY)
Admission: RE | Disposition: A | Discharge status: Home / Self Care | Payer: Self-pay | Source: Ambulatory Visit | Attending: Gastroenterology

## 2020-05-21 ENCOUNTER — Ambulatory Visit (HOSPITAL_COMMUNITY)
Admission: RE | Admit: 2020-05-21 | Discharge: 2020-05-21 | Disposition: A | Discharge status: Home / Self Care | Payer: Medicare HMO | Source: Ambulatory Visit | Attending: Gastroenterology | Admitting: Gastroenterology

## 2020-05-21 DIAGNOSIS — Z09 Encounter for follow-up examination after completed treatment for conditions other than malignant neoplasm: Secondary | ICD-10-CM | POA: Diagnosis not present

## 2020-05-21 DIAGNOSIS — R0902 Hypoxemia: Secondary | ICD-10-CM | POA: Insufficient documentation

## 2020-05-21 DIAGNOSIS — K644 Residual hemorrhoidal skin tags: Secondary | ICD-10-CM | POA: Diagnosis not present

## 2020-05-21 DIAGNOSIS — D860 Sarcoidosis of lung: Secondary | ICD-10-CM | POA: Diagnosis not present

## 2020-05-21 DIAGNOSIS — Z79899 Other long term (current) drug therapy: Secondary | ICD-10-CM | POA: Insufficient documentation

## 2020-05-21 DIAGNOSIS — Z9981 Dependence on supplemental oxygen: Secondary | ICD-10-CM | POA: Diagnosis not present

## 2020-05-21 DIAGNOSIS — I10 Essential (primary) hypertension: Secondary | ICD-10-CM | POA: Diagnosis not present

## 2020-05-21 DIAGNOSIS — K648 Other hemorrhoids: Secondary | ICD-10-CM | POA: Diagnosis not present

## 2020-05-21 DIAGNOSIS — Z7951 Long term (current) use of inhaled steroids: Secondary | ICD-10-CM | POA: Diagnosis not present

## 2020-05-21 DIAGNOSIS — Z860100 Personal history of colon polyps, unspecified: Secondary | ICD-10-CM

## 2020-05-21 DIAGNOSIS — J9621 Acute and chronic respiratory failure with hypoxia: Secondary | ICD-10-CM | POA: Diagnosis not present

## 2020-05-21 DIAGNOSIS — D862 Sarcoidosis of lung with sarcoidosis of lymph nodes: Secondary | ICD-10-CM | POA: Diagnosis not present

## 2020-05-21 HISTORY — DX: Essential (primary) hypertension: I10

## 2020-05-21 HISTORY — PX: COLONOSCOPY WITH PROPOFOL: SHX5780

## 2020-05-21 SURGERY — COLONOSCOPY WITH PROPOFOL
Anesthesia: Monitor Anesthesia Care

## 2020-05-21 MED ORDER — LIDOCAINE 2% (20 MG/ML) 5 ML SYRINGE
INTRAMUSCULAR | Status: DC | PRN
  Administered 2020-05-21: 60 mg via INTRAVENOUS

## 2020-05-21 MED ORDER — PROPOFOL 10 MG/ML IV BOLUS
INTRAVENOUS | Status: AC
  Filled 2020-05-21: qty 20

## 2020-05-21 MED ORDER — PROPOFOL 500 MG/50ML IV EMUL
INTRAVENOUS | Status: DC | PRN
  Administered 2020-05-21: 150 ug/kg/min via INTRAVENOUS

## 2020-05-21 MED ORDER — LACTATED RINGERS IV SOLN: INTRAVENOUS | Status: DC

## 2020-05-21 MED ORDER — PROPOFOL 10 MG/ML IV BOLUS
INTRAVENOUS | Status: DC | PRN
  Administered 2020-05-21: 30 mg via INTRAVENOUS

## 2020-05-21 MED ORDER — PROPOFOL 500 MG/50ML IV EMUL
INTRAVENOUS | Status: AC
  Filled 2020-05-21: qty 50

## 2020-05-21 SURGICAL SUPPLY — 21 items

## 2020-05-21 NOTE — Discharge Instructions (Signed)

## 2020-05-21 NOTE — Interval H&P Note (Signed)
History and Physical Interval Note:  05/21/2020 10:30 AM  [name]  has presented today for surgery, with the diagnosis of history of colon polyps  on O2.  The various methods of treatment have been discussed with the patient and family. After consideration of risks, benefits and other options for treatment, the patient has consented to  Procedure(s): COLONOSCOPY WITH PROPOFOL (N/A) as a surgical intervention.  The patient's history has been reviewed, patient examined, no change in status, stable for surgery.  I have reviewed the patient's chart and labs.  Questions were answered to the patient's satisfaction.     Lauren Massey

## 2020-05-21 NOTE — Transfer of Care (Signed)
Immediate Anesthesia Transfer of Care Note  Patient: Lauren Massey  Procedure(s) Performed: COLONOSCOPY WITH PROPOFOL (N/A )  Patient Location: Endoscopy Unit  Anesthesia Type:MAC  Level of Consciousness: awake and patient cooperative  Airway & Oxygen Therapy: Patient Spontanous Breathing and Patient connected to face mask  Post-op Assessment: Report given to RN and Post -op Vital signs reviewed and stable  Post vital signs: Reviewed and stable  Last Vitals:  Vitals Value Taken Time  BP    Temp    Pulse    Resp 15 05/21/20 1114  SpO2    Vitals shown include unvalidated device data.  Last Pain:  Vitals:   05/21/20 0944  TempSrc: Oral  PainSc: 0-No pain         Complications: No complications documented.

## 2020-05-21 NOTE — Op Note (Signed)
[facility] Patient Name: Lauren Massey Procedure Date: 05/21/2020 MRN: [id number] Attending MD: Mauri Pole , MD Date of Birth: [birth year] CSN: [id number] Age: 65 Admit Type: Outpatient Procedure:                Colonoscopy Indications:              High risk colon cancer surveillance: Personal                            history of colonic polyps, Surveillance: Personal                            history of colonic polyps (unknown histology) on                            last colonoscopy 3 years ago, High risk colon                            cancer surveillance: Personal history of multiple                            (3 or more) adenomas Providers:                Mauri Pole, MD, Benay Pillow, RN, Lesia Sago, Technician, Dellie Catholic Referring MD:              Medicines:                Monitored Anesthesia Care Complications:            No immediate complications. Estimated Blood Loss:     Estimated blood loss: none. Procedure:                Pre-Anesthesia Assessment:                           - Prior to the procedure, a History and Physical                            was performed, and patient medications and                            allergies were reviewed. The patient's tolerance of                            previous anesthesia was also reviewed. The risks                            and benefits of the procedure and the sedation                            options and risks were discussed with the patient.  All questions were answered, and informed consent                            was obtained. Prior Anticoagulants: The patient has                            taken no previous anticoagulant or antiplatelet                            agents. ASA Grade Assessment: III - A patient with                            severe systemic disease. After reviewing the risks                             and benefits, the patient was deemed in                            satisfactory condition to undergo the procedure.                           After obtaining informed consent, the colonoscope                            was passed under direct vision. Throughout the                            procedure, the patient's blood pressure, pulse, and                            oxygen saturations were monitored continuously. The                            PCF-H190DL ([id number]) Olympus pediatric colonscope                            was introduced through the anus and advanced to the                            the cecum, identified by appendiceal orifice and                            ileocecal valve. The colonoscopy was performed                            without difficulty. The patient tolerated the                            procedure well. The quality of the bowel                            preparation was excellent. The ileocecal valve,  appendiceal orifice, and rectum were photographed. Scope In: 10:50:32 AM Scope Out: 11:08:34 AM Scope Withdrawal Time: 0 hours 13 minutes 11 seconds  Total Procedure Duration: 0 hours 18 minutes 2 seconds  Findings:      The perianal and digital rectal examinations were normal.      Non-bleeding external and internal hemorrhoids were found during       retroflexion. The hemorrhoids were medium-sized. Impression:               - Non-bleeding external and internal hemorrhoids.                           - No specimens collected. Moderate Sedation:      N/A Recommendation:           - Patient has a contact number available for                            emergencies. The signs and symptoms of potential                            delayed complications were discussed with the                            patient. Return to normal activities tomorrow.                            Written discharge instructions were provided to the                             patient.                           - Resume previous diet.                           - Continue present medications.                           - Repeat colonoscopy in 5 years for surveillance                            due to historyu of adenomatous colon polyps Procedure Code(s):        --- Professional ---                           G0105, Colorectal cancer screening; colonoscopy on                            individual at high risk Diagnosis Code(s):        --- Professional ---                           K64.8, Other hemorrhoids                           Z86.010, Personal history of colonic polyps CPT copyright 2019 American Medical Association. All rights reserved. The codes  documented in this report are preliminary and upon coder review may  be revised to meet current compliance requirements. Mauri Pole, MD 05/21/2020 11:17:56 AM This report has been signed electronically. Number of Addenda: 0

## 2020-05-21 NOTE — Anesthesia Postprocedure Evaluation (Signed)
Anesthesia Post Note  Patient: Lauren Massey  Procedure(s) Performed: COLONOSCOPY WITH PROPOFOL (N/A )     Patient location during evaluation: PACU Anesthesia Type: MAC Level of consciousness: awake and alert Pain management: pain level controlled Vital Signs Assessment: post-procedure vital signs reviewed and stable Respiratory status: spontaneous breathing, nonlabored ventilation, respiratory function stable and patient connected to nasal cannula oxygen Cardiovascular status: stable and blood pressure returned to baseline Postop Assessment: no apparent nausea or vomiting Anesthetic complications: no   No complications documented.  Last Vitals:  Vitals:   05/21/20 1125 05/21/20 1145  BP: 125/81 113/82  Pulse: 65 61  Resp: 17 (!) 23  Temp:    SpO2: 94% 91%    Last Pain:  Vitals:   05/21/20 1145  TempSrc:   PainSc: 0-No pain                 [name]

## 2020-05-22 ENCOUNTER — Encounter (HOSPITAL_COMMUNITY): Payer: Self-pay | Admitting: Gastroenterology

## 2020-06-05 DIAGNOSIS — J9621 Acute and chronic respiratory failure with hypoxia: Secondary | ICD-10-CM | POA: Diagnosis not present

## 2020-06-11 ENCOUNTER — Telehealth: Payer: Self-pay | Admitting: Internal Medicine

## 2020-06-11 DIAGNOSIS — J9621 Acute and chronic respiratory failure with hypoxia: Secondary | ICD-10-CM | POA: Diagnosis not present

## 2020-06-11 NOTE — Telephone Encounter (Signed)
ATC, no answer, left VM 

## 2020-06-12 NOTE — Telephone Encounter (Signed)
Spoke with the pt  She is asking if we can fill out a handicap placard for her She uses exertional o2, and her breathing is worse when having to walk a long distance, esp in the humid weather  Please advise, and if okay we can bring you the form to sign and pt stated she would come here and pick it up, thanks!

## 2020-06-12 NOTE — Telephone Encounter (Signed)
OK 

## 2020-06-12 NOTE — Telephone Encounter (Signed)
Form completed and placed up front for the pt to pick up  Pt aware  Nothing further needed

## 2020-06-30 DIAGNOSIS — D869 Sarcoidosis, unspecified: Secondary | ICD-10-CM | POA: Diagnosis not present

## 2020-06-30 DIAGNOSIS — H18421 Band keratopathy, right eye: Secondary | ICD-10-CM | POA: Diagnosis not present

## 2020-06-30 DIAGNOSIS — H35371 Puckering of macula, right eye: Secondary | ICD-10-CM | POA: Diagnosis not present

## 2020-06-30 DIAGNOSIS — H21542 Posterior synechiae (iris), left eye: Secondary | ICD-10-CM | POA: Diagnosis not present

## 2020-06-30 DIAGNOSIS — Z961 Presence of intraocular lens: Secondary | ICD-10-CM | POA: Diagnosis not present

## 2020-06-30 DIAGNOSIS — H30003 Unspecified focal chorioretinal inflammation, bilateral: Secondary | ICD-10-CM | POA: Diagnosis not present

## 2020-06-30 DIAGNOSIS — Z79899 Other long term (current) drug therapy: Secondary | ICD-10-CM | POA: Diagnosis not present

## 2020-06-30 DIAGNOSIS — H2512 Age-related nuclear cataract, left eye: Secondary | ICD-10-CM | POA: Diagnosis not present

## 2020-07-03 DIAGNOSIS — Z79899 Other long term (current) drug therapy: Secondary | ICD-10-CM | POA: Diagnosis not present

## 2020-07-03 DIAGNOSIS — H30003 Unspecified focal chorioretinal inflammation, bilateral: Secondary | ICD-10-CM | POA: Diagnosis not present

## 2020-07-06 DIAGNOSIS — J9621 Acute and chronic respiratory failure with hypoxia: Secondary | ICD-10-CM | POA: Diagnosis not present

## 2020-07-12 DIAGNOSIS — J9621 Acute and chronic respiratory failure with hypoxia: Secondary | ICD-10-CM | POA: Diagnosis not present

## 2020-07-15 DIAGNOSIS — K006 Disturbances in tooth eruption: Secondary | ICD-10-CM | POA: Diagnosis not present

## 2020-08-05 DIAGNOSIS — J9621 Acute and chronic respiratory failure with hypoxia: Secondary | ICD-10-CM | POA: Diagnosis not present

## 2020-08-11 DIAGNOSIS — J9621 Acute and chronic respiratory failure with hypoxia: Secondary | ICD-10-CM | POA: Diagnosis not present

## 2020-09-05 DIAGNOSIS — J9621 Acute and chronic respiratory failure with hypoxia: Secondary | ICD-10-CM | POA: Diagnosis not present

## 2020-09-11 DIAGNOSIS — J9621 Acute and chronic respiratory failure with hypoxia: Secondary | ICD-10-CM | POA: Diagnosis not present

## 2020-10-06 DIAGNOSIS — H18421 Band keratopathy, right eye: Secondary | ICD-10-CM | POA: Diagnosis not present

## 2020-10-06 DIAGNOSIS — H21542 Posterior synechiae (iris), left eye: Secondary | ICD-10-CM | POA: Diagnosis not present

## 2020-10-06 DIAGNOSIS — J9621 Acute and chronic respiratory failure with hypoxia: Secondary | ICD-10-CM | POA: Diagnosis not present

## 2020-10-06 DIAGNOSIS — Z79899 Other long term (current) drug therapy: Secondary | ICD-10-CM | POA: Diagnosis not present

## 2020-10-06 DIAGNOSIS — H35371 Puckering of macula, right eye: Secondary | ICD-10-CM | POA: Diagnosis not present

## 2020-10-06 DIAGNOSIS — H2512 Age-related nuclear cataract, left eye: Secondary | ICD-10-CM | POA: Diagnosis not present

## 2020-10-06 DIAGNOSIS — H30003 Unspecified focal chorioretinal inflammation, bilateral: Secondary | ICD-10-CM | POA: Diagnosis not present

## 2020-10-06 DIAGNOSIS — D869 Sarcoidosis, unspecified: Secondary | ICD-10-CM | POA: Diagnosis not present

## 2020-10-06 DIAGNOSIS — Z961 Presence of intraocular lens: Secondary | ICD-10-CM | POA: Diagnosis not present

## 2020-10-06 NOTE — Progress Notes (Deleted)
Patient ID: Lauren Massey, female    DOB: 1955/11/14, 65 y.o.   MRN: [id number]  HPI  Female never smoker followed for Sarcoid III involving eyes/iritis, lungs and skin-bronchoscopy 2000. Failed Plaquenil for skin rash. ACE 02/19/15- elevated 97, down from >100 07/2014.  ACE 05/12/16-   50 (9-67) Office Spirometry 08/06/2015-moderate restriction. FVC 1.53/63%, FEV1 1.19/62%, FEV1/FVC 0.78 Room air saturation at rest on arrival 03/14/16- 88%, desaturating to 85% with ambulation and improving to 98% ambulating on 2 L. PFT 03/16/17-moderately severe restriction, severe reduction of diffusion.  FVC 1.62/66%, FEV1 1.46/76%, ratio 0.90, TLC 50%, DLCO 26% Had VATS/ robotic pleurectomy 02/2019 and again 08/23/19-for recurrent ptx realted to fibrocystic sarcoid. 6 MWT 10/01/19- Arrived RA 96%, desat to 87% on room air, corrected to 91% on 2L. -------------------------------------------------------------------------------------------------------   04/06/20- 65 year old female never smoker followed for Sarcoid III involving eyes/iritis, lungs,rectum, skin-bronchoscopy 2000, chronic hypoxic respiratory failure Had VATS/ robotic pleurodesis 02/2019 and again 08/23/19-for recurrent ptx realted to fibrocystic sarcoid ACE in 2018 was down to 50 ( 9-67) O2   1.5-2 L sleep / [facility]     MTX, , Proventil HFA,  Covid vax- Flu vax- -----Patients wears 2 liters during the day and 1 1/2 at night. Patient has a productive cough with clear/white sputum. Worse in the morning.   No recent acute issues. Does have morning cough with white phlegm, then clear rest of day. Does not have maintenance inhaler. We're going to try Trelegy samples. Continues MTX and being followed closely for occular sarcoid, CXR- 01/02/20- IMPRESSION: Severe chronic bilateral interstitial changes and bullous changes again noted. Bilateral pleural thickening consistent with scarring again noted. No evidence of pneumothorax on today's  exam.  10/07/20- 65 year old female never smoker followed for Sarcoid III involving eyes/iritis, lungs,rectum, skin-bronchoscopy 2000, chronic hypoxic respiratory failure Had VATS/ robotic pleurodesis 02/2019 and again 08/23/19-for recurrent ptx realted to fibrocystic sarcoid ACE in 2018 was down to 50 ( 9-67) O2   1.5-2 L sleep / [facility]     -MTX, , Proventil HFA,  Covid vax-   CXR 04/06/20- FINDINGS: Bilateral parenchymal lung densities have not significantly changed. Patient has known bullous changes at the lung bases. Heart and mediastinum are stable. Negative for a large pneumothorax. No significant pleural fluid. No acute bone abnormality. The trachea is midline. IMPRESSION: Chronic lung changes without acute findings.   Review of Systems- see HPI   + = positive Constitutional:   weight loss, night sweats, fevers, chills, fatigue, lassitude. HEENT:   No-  headaches, difficulty swallowing, tooth/dental problems, sore throat,       No-  sneezing, itching, ear ache, nasal congestion, post nasal drip,  CV:  +chest pain, orthopnea, PND, swelling in lower extremities, anasarca,   dizziness, palpitations Resp:+shortness of breath with exertion or at rest.               +productive cough,   non-productive cough,  No-  coughing up of blood.              No-   change in color of mucus.  No- wheezing.   Skin: +  rash or lesions. GI:  No-   heartburn, indigestion, abdominal pain, nausea, vomiting,  GU:  MS:  No-   joint pain or swelling.  No- decreased range of motion.  No- back pain. Neuro- nothing unusual Psych:  No- change in mood or affect. No depression or anxiety.  No memory loss.  Objective:   Physical Exam General- Alert, Oriented, Affect-appropriate,  Distress- none acute, slender Skin- + multiple small linear plaques along flexure lines left side of neck, down onto upper chest, low back,  in the right posterior axillary line and extensor surfaces of forearms and elbows she  has stable macular lesions which she says itch and are tender. + Reddened nonspecific dermatitis dorsum right hand. Lymphadenopathy- none Head- atraumatic            Eyes- Gross vision intact, PERRLA, conjunctivae clear secretions            Ears- Hearing, canals normal            Nose- Clear, No-Septal dev, mucus, polyps, erosion, perforation             Throat- Mallampati IV , mucosa clear , drainage- none, tonsils- atrophic Neck- flexible , trachea midline, no stridor , thyroid nl, carotid no bruit Chest - symmetrical excursion , unlabored           Heart/CV- RRR , no murmur , no gallop  , no rub, nl s1,    +P2 prominent                           - JVD +1 , edema- none, stasis changes- none, varices- none           Lung- +diminished on R, wheeze- none, cough - none , dullness-none, rub- none,                 Chest wall- + 2 cm rubbery somewhat mobile subcutaneous nodule at distal end of left clavicle- probable lipoma Abd- no HSM Br/ Gen/ Rectal- Not done, not indicated Extrem- cyanosis- none, clubbing, none, atrophy- none, strength- nl, +1 cm rubbery nodule at base R thumb Neuro- grossly intact to observation Assessment & Plan:

## 2020-10-07 ENCOUNTER — Ambulatory Visit: Payer: Medicare HMO | Admitting: Internal Medicine

## 2020-10-12 DIAGNOSIS — J9621 Acute and chronic respiratory failure with hypoxia: Secondary | ICD-10-CM | POA: Diagnosis not present

## 2020-11-05 DIAGNOSIS — J9621 Acute and chronic respiratory failure with hypoxia: Secondary | ICD-10-CM | POA: Diagnosis not present

## 2020-11-11 DIAGNOSIS — J9621 Acute and chronic respiratory failure with hypoxia: Secondary | ICD-10-CM | POA: Diagnosis not present

## 2020-12-02 NOTE — Progress Notes (Signed)
Patient ID: Lauren Massey, female    DOB: 1955/12/10, 65 y.o.   MRN: [id number]  HPI  Female never smoker followed for Sarcoid III involving eyes/iritis, lungs and skin-bronchoscopy 2000. Failed Plaquenil for skin rash. ACE 02/19/15- elevated 97, down from >100 07/2014.  ACE 05/12/16-   50 (9-67) Office Spirometry 08/06/2015-moderate restriction. FVC 1.53/63%, FEV1 1.19/62%, FEV1/FVC 0.78 Room air saturation at rest on arrival 03/14/16- 88%, desaturating to 85% with ambulation and improving to 98% ambulating on 2 L. PFT 03/16/17-moderately severe restriction, severe reduction of diffusion.  FVC 1.62/66%, FEV1 1.46/76%, ratio 0.90, TLC 50%, DLCO 26% Had VATS/ robotic pleurectomy 02/2019 and again 08/23/19-for recurrent ptx realted to fibrocystic sarcoid. 6 MWT 10/01/19- Arrived RA 96%, desat to 87% on room air, corrected to 91% on 2L. -------------------------------------------------------------------------------------------------------   04/06/20- 65 year old female never smoker followed for Sarcoid III involving eyes/iritis, lungs,rectum, skin-bronchoscopy 2000, chronic hypoxic respiratory failure Had VATS/ robotic pleurodesis 02/2019 and again 08/23/19-for recurrent ptx realted to fibrocystic sarcoid ACE in 2018 was down to 50 ( 9-67) O2   1.5-2 L sleep / [facility]     MTX, , Proventil HFA,  Covid vax- Flu vax- -----Patients wears 2 liters during the day and 1 1/2 at night. Patient has a productive cough with clear/white sputum. Worse in the morning.   No recent acute issues. Does have morning cough with white phlegm, then clear rest of day. Does not have maintenance inhaler. We're going to try Trelegy samples. Continues MTX and being followed closely for occular sarcoid, CXR- 01/02/20- IMPRESSION: Severe chronic bilateral interstitial changes and bullous changes again noted. Bilateral pleural thickening consistent with scarring again noted. No evidence of pneumothorax on today's  exam.  12/03/20- 65 year old female never smoker followed for Sarcoid III involving eyes/iritis, lungs,rectum, skin-bronchoscopy 2000, chronic hypoxic respiratory failure Had VATS/ robotic pleurodesis 02/2019 and again 08/23/19-for recurrent ptx related to fibrocystic sarcoid ACE in 2018 was down to 50 ( 9-67) ACE 04/06/20- 71 (9-67) persistently high x 10 years O2   1.5-2 L sleep / [facility]     -Trelegy 100, MTX, , Proventil HFA,  Covid vax-3 Phizer Flu vax- Commented on her cold hands, chronic, without blanching or pain.  Explained how this may affect oxygen saturation. Increased shortness of breath x2 weeks and increased her oxygen to 2-4 L. No obvious infection or acute event. CXR 3/114/22- FINDINGS: Bilateral parenchymal lung densities have not significantly changed. Patient has known bullous changes at the lung bases. Heart and mediastinum are stable. Negative for a large pneumothorax. No significant pleural fluid. No acute bone abnormality. The trachea is midline. IMPRESSION: Chronic lung changes without acute findings.  Review of Systems- see HPI   + = positive Constitutional:   weight loss, night sweats, fevers, chills, fatigue, lassitude. HEENT:   No-  headaches, difficulty swallowing, tooth/dental problems, sore throat,       No-  sneezing, itching, ear ache, nasal congestion, post nasal drip,  CV:  +chest pain, orthopnea, PND, swelling in lower extremities, anasarca,   dizziness, palpitations Resp:+shortness of breath with exertion or at rest.               +productive cough,   non-productive cough,  No-  coughing up of blood.              No-   change in color of mucus.  No- wheezing.   Skin: +  rash or lesions. GI:  No-   heartburn, indigestion, abdominal pain, nausea, vomiting,  GU:  MS:  No-   joint pain or swelling.  No- decreased range of motion.  No- back pain. Neuro- nothing unusual Psych:  No- change in mood or affect. No depression or anxiety.  No memory  loss.  Objective:   Physical Exam General- Alert, Oriented, Affect-appropriate, Distress- none acute, slender Skin- + multiple small linear plaques along flexure lines left side of neck, down onto upper chest, low back,  in the right posterior axillary line and extensor surfaces of forearms and elbows she has stable macular lesions which she says itch and are tender. + Reddened nonspecific dermatitis dorsum right hand. Lymphadenopathy- none Head- atraumatic            Eyes- Gross vision intact, PERRLA, conjunctivae clear secretions            Ears- Hearing, canals normal            Nose- Clear, No-Septal dev, mucus, polyps, erosion, perforation             Throat- Mallampati IV , mucosa clear , drainage- none, tonsils- atrophic Neck- flexible , trachea midline, no stridor , thyroid nl, carotid no bruit Chest - symmetrical excursion , unlabored           Heart/CV- RRR , no murmur , no gallop  , no rub, nl s1,    +P2 prominent                           - JVD +1 , edema- none, stasis changes- none, varices- none           Lung- +diminished on R, wheeze- none, cough - none , dullness-none, rub- none,                 Chest wall- + 2 cm rubbery somewhat mobile subcutaneous nodule at distal end of left clavicle- probable lipoma Abd- no HSM Br/ Gen/ Rectal- Not done, not indicated Extrem- + cool fingers, cyanosis- none, clubbing, none, atrophy- none, strength- nl, +1 cm rubbery nodule at base R thumb Neuro- grossly intact to observation Assessment & Plan:

## 2020-12-03 ENCOUNTER — Other Ambulatory Visit: Payer: Self-pay

## 2020-12-03 ENCOUNTER — Encounter: Payer: Self-pay | Admitting: Internal Medicine

## 2020-12-03 ENCOUNTER — Ambulatory Visit (INDEPENDENT_AMBULATORY_CARE_PROVIDER_SITE_OTHER): Payer: Medicare HMO

## 2020-12-03 ENCOUNTER — Ambulatory Visit: Payer: Medicare HMO | Admitting: Internal Medicine

## 2020-12-03 VITALS — BP 126/70 | HR 65 | Temp 97.8°F | Ht 62.0 in | Wt 134.2 lb

## 2020-12-03 DIAGNOSIS — Z23 Encounter for immunization: Secondary | ICD-10-CM

## 2020-12-03 DIAGNOSIS — J9611 Chronic respiratory failure with hypoxia: Secondary | ICD-10-CM

## 2020-12-03 DIAGNOSIS — D869 Sarcoidosis, unspecified: Secondary | ICD-10-CM | POA: Diagnosis not present

## 2020-12-03 DIAGNOSIS — R0609 Other forms of dyspnea: Secondary | ICD-10-CM

## 2020-12-03 DIAGNOSIS — J849 Interstitial pulmonary disease, unspecified: Secondary | ICD-10-CM | POA: Diagnosis not present

## 2020-12-03 DIAGNOSIS — J479 Bronchiectasis, uncomplicated: Secondary | ICD-10-CM | POA: Diagnosis not present

## 2020-12-03 LAB — CBC WITH DIFFERENTIAL/PLATELET
Basophils Absolute: 0 10*3/uL (ref 0.0–0.1)
Basophils Relative: 0.6 % (ref 0.0–3.0)
Eosinophils Absolute: 0.1 10*3/uL (ref 0.0–0.7)
Eosinophils Relative: 3 % (ref 0.0–5.0)
HCT: 38.6 % (ref 36.0–46.0)
Hemoglobin: 12.3 g/dL (ref 12.0–15.0)
Lymphocytes Relative: 39.8 % (ref 12.0–46.0)
Lymphs Abs: 1.6 10*3/uL (ref 0.7–4.0)
MCHC: 31.8 g/dL (ref 30.0–36.0)
MCV: 87.9 fl (ref 78.0–100.0)
Monocytes Absolute: 0.5 10*3/uL (ref 0.1–1.0)
Monocytes Relative: 11.1 % (ref 3.0–12.0)
Neutro Abs: 1.9 10*3/uL (ref 1.4–7.7)
Neutrophils Relative %: 45.5 % (ref 43.0–77.0)
Platelets: 163 10*3/uL (ref 150.0–400.0)
RBC: 4.39 Mil/uL (ref 3.87–5.11)
RDW: 13.5 % (ref 11.5–15.5)
WBC: 4.1 10*3/uL (ref 4.0–10.5)

## 2020-12-03 LAB — D-DIMER, QUANTITATIVE: D-Dimer, Quant: 1.15 mcg/mL FEU — ABNORMAL HIGH (ref <0.50)

## 2020-12-03 MED ORDER — BREZTRI AEROSPHERE 160-9-4.8 MCG/ACT IN AERO: 2.0000 | INHALATION_SPRAY | Freq: Two times a day (BID) | RESPIRATORY_TRACT | 0 refills | Status: DC

## 2020-12-03 MED ORDER — ALBUTEROL SULFATE HFA 108 (90 BASE) MCG/ACT IN AERS: 2.0000 | INHALATION_SPRAY | Freq: Four times a day (QID) | RESPIRATORY_TRACT | 12 refills | Status: DC | PRN

## 2020-12-03 NOTE — Patient Instructions (Addendum)
Order- CXR   dx Sarcoid   Order- lab- CBC w diff, BMET, D-dimer    dx dyspnea on exertion  Order- flu vax senior  Order- sample Breztri inhaler     inhale 2 puffs, then rinse your mouth, twice daily  Script sent refilling albuterol rescue inhaler

## 2020-12-04 ENCOUNTER — Other Ambulatory Visit: Payer: Self-pay

## 2020-12-04 ENCOUNTER — Ambulatory Visit
Admission: RE | Admit: 2020-12-04 | Discharge: 2020-12-04 | Disposition: A | Discharge status: Home / Self Care | Payer: Medicare HMO | Source: Ambulatory Visit | Attending: Internal Medicine | Admitting: Internal Medicine

## 2020-12-04 DIAGNOSIS — J479 Bronchiectasis, uncomplicated: Secondary | ICD-10-CM | POA: Diagnosis not present

## 2020-12-04 DIAGNOSIS — D8689 Sarcoidosis of other sites: Secondary | ICD-10-CM | POA: Diagnosis not present

## 2020-12-04 DIAGNOSIS — R7989 Other specified abnormal findings of blood chemistry: Secondary | ICD-10-CM

## 2020-12-04 DIAGNOSIS — I898 Other specified noninfective disorders of lymphatic vessels and lymph nodes: Secondary | ICD-10-CM | POA: Diagnosis not present

## 2020-12-04 DIAGNOSIS — K5939 Other megacolon: Secondary | ICD-10-CM | POA: Diagnosis not present

## 2020-12-04 LAB — BASIC METABOLIC PANEL
BUN: 14 mg/dL (ref 6–23)
CO2: 31 mEq/L (ref 19–32)
Calcium: 10 mg/dL (ref 8.4–10.5)
Chloride: 98 mEq/L (ref 96–112)
Creatinine, Ser: 0.77 mg/dL (ref 0.40–1.20)
GFR: 80.56 mL/min (ref >60.00)
Glucose, Bld: 83 mg/dL (ref 70–99)
Potassium: 3.6 mEq/L (ref 3.5–5.1)
Sodium: 136 mEq/L (ref 135–145)

## 2020-12-04 MED ORDER — IOPAMIDOL (ISOVUE-370) INJECTION 76%
75.0000 mL | Freq: Once | INTRAVENOUS | Status: AC | PRN
  Administered 2020-12-04: 75 mL via INTRAVENOUS

## 2020-12-05 ENCOUNTER — Encounter: Payer: Self-pay | Admitting: *Deleted

## 2020-12-22 ENCOUNTER — Encounter: Payer: Self-pay | Admitting: Internal Medicine

## 2020-12-22 NOTE — Assessment & Plan Note (Signed)
Okay to use oxygen 2-4 L.  Warm hands before testing. Plan flu vaccine, sample Breztri inhaler, CXR, CBC with differential, D-dimer

## 2020-12-22 NOTE — Assessment & Plan Note (Signed)
Clinically stable fibrotic scarring residual from sarcoid without evident progression.  She had required intervention for recurrent pneumothoraces in the past.

## 2021-02-09 ENCOUNTER — Ambulatory Visit (INDEPENDENT_AMBULATORY_CARE_PROVIDER_SITE_OTHER): Payer: Medicare HMO | Admitting: Internal Medicine

## 2021-02-09 ENCOUNTER — Other Ambulatory Visit: Payer: Self-pay

## 2021-02-09 ENCOUNTER — Encounter: Payer: Self-pay | Admitting: Internal Medicine

## 2021-02-09 ENCOUNTER — Ambulatory Visit (INDEPENDENT_AMBULATORY_CARE_PROVIDER_SITE_OTHER): Payer: Medicare HMO

## 2021-02-09 VITALS — BP 130/78 | HR 71 | Resp 18 | Ht 62.0 in | Wt 138.4 lb

## 2021-02-09 DIAGNOSIS — M5442 Lumbago with sciatica, left side: Secondary | ICD-10-CM

## 2021-02-09 DIAGNOSIS — I10 Essential (primary) hypertension: Secondary | ICD-10-CM | POA: Diagnosis not present

## 2021-02-09 DIAGNOSIS — M545 Low back pain, unspecified: Secondary | ICD-10-CM | POA: Diagnosis not present

## 2021-02-09 DIAGNOSIS — M2578 Osteophyte, vertebrae: Secondary | ICD-10-CM | POA: Diagnosis not present

## 2021-02-09 LAB — COMPREHENSIVE METABOLIC PANEL
ALT: 16 U/L (ref 0–35)
AST: 28 U/L (ref 0–37)
Albumin: 3.9 g/dL (ref 3.5–5.2)
Alkaline Phosphatase: 69 U/L (ref 39–117)
BUN: 17 mg/dL (ref 6–23)
CO2: 33 mEq/L — ABNORMAL HIGH (ref 19–32)
Calcium: 9.9 mg/dL (ref 8.4–10.5)
Chloride: 97 mEq/L (ref 96–112)
Creatinine, Ser: 0.73 mg/dL (ref 0.40–1.20)
GFR: 85.77 mL/min (ref >60.00)
Glucose, Bld: 98 mg/dL (ref 70–99)
Potassium: 3.6 mEq/L (ref 3.5–5.1)
Sodium: 136 mEq/L (ref 135–145)
Total Bilirubin: 0.4 mg/dL (ref 0.2–1.2)
Total Protein: 9.8 g/dL — ABNORMAL HIGH (ref 6.0–8.3)

## 2021-02-09 LAB — LIPID PANEL
Cholesterol: 245 mg/dL — ABNORMAL HIGH (ref 0–200)
HDL: 67.7 mg/dL (ref >39.00)
LDL Cholesterol: 157 mg/dL — ABNORMAL HIGH (ref 0–99)
NonHDL: 176.97
Total CHOL/HDL Ratio: 4
Triglycerides: 101 mg/dL (ref 0.0–149.0)
VLDL: 20.2 mg/dL (ref 0.0–40.0)

## 2021-02-09 LAB — CBC
HCT: 38.4 % (ref 36.0–46.0)
Hemoglobin: 12.1 g/dL (ref 12.0–15.0)
MCHC: 31.5 g/dL (ref 30.0–36.0)
MCV: 87.9 fl (ref 78.0–100.0)
Platelets: 177 10*3/uL (ref 150.0–400.0)
RBC: 4.37 Mil/uL (ref 3.87–5.11)
RDW: 15.3 % (ref 11.5–15.5)
WBC: 4 10*3/uL (ref 4.0–10.5)

## 2021-02-09 LAB — FERRITIN: Ferritin: 65 ng/mL (ref 10.0–291.0)

## 2021-02-09 LAB — VITAMIN B12: Vitamin B-12: 479 pg/mL (ref 211–911)

## 2021-02-09 LAB — VITAMIN D 25 HYDROXY (VIT D DEFICIENCY, FRACTURES): VITD: 11.14 ng/mL — ABNORMAL LOW (ref 30.00–100.00)

## 2021-02-09 LAB — TSH: TSH: 0.84 u[IU]/mL (ref 0.35–5.50)

## 2021-02-09 MED ORDER — HYDROCHLOROTHIAZIDE 12.5 MG PO CAPS: 12.5000 mg | ORAL_CAPSULE | Freq: Every day | ORAL | 3 refills | Status: DC

## 2021-02-09 NOTE — Patient Instructions (Signed)
We are checking the labs and the xray today,

## 2021-02-09 NOTE — Progress Notes (Signed)
° °  Subjective:   Patient ID: Lauren Massey, female    DOB: 09-Jul-1955, 66 y.o.   MRN: [id number]  HPI The patient is a 66 YO female coming in for pain left lower back and then heaviness to leg. Started 2 weeks ago. Pain is resolving but heaviness is still there but inconsistent.   Review of Systems  Constitutional: Negative.   HENT: Negative.    Eyes: Negative.   Respiratory:  Negative for cough, chest tightness and shortness of breath.   Cardiovascular:  Negative for chest pain, palpitations and leg swelling.  Gastrointestinal:  Negative for abdominal distention, abdominal pain, constipation, diarrhea, nausea and vomiting.  Musculoskeletal:  Positive for back pain.  Skin: Negative.   Neurological:  Positive for weakness.  Psychiatric/Behavioral: Negative.     Objective:  Physical Exam Constitutional:      Appearance: She is well-developed.  HENT:     Head: Normocephalic and atraumatic.  Cardiovascular:     Rate and Rhythm: Normal rate and regular rhythm.  Pulmonary:     Effort: Pulmonary effort is normal. No respiratory distress.     Breath sounds: Normal breath sounds. No wheezing or rales.  Abdominal:     General: Bowel sounds are normal. There is no distension.     Palpations: Abdomen is soft.     Tenderness: There is no abdominal tenderness. There is no rebound.  Musculoskeletal:     Cervical back: Normal range of motion.  Skin:    General: Skin is warm and dry.  Neurological:     Mental Status: She is alert and oriented to person, place, and time.     Coordination: Coordination normal.    Vitals:   02/09/21 1427  BP: 130/78  Pulse: 71  Resp: 18  SpO2: 100%  Weight: 138 lb 6.4 oz (62.8 kg)  Height: 5\' 2"  (1.575 m)    This visit occurred during the SARS-CoV-2 public health emergency.  Safety protocols were in place, including screening questions prior to the visit, additional usage of staff PPE, and extensive cleaning of exam room while observing appropriate  contact time as indicated for disinfecting solutions.   Assessment & Plan:

## 2021-02-10 DIAGNOSIS — M5442 Lumbago with sciatica, left side: Secondary | ICD-10-CM | POA: Insufficient documentation

## 2021-02-10 NOTE — Assessment & Plan Note (Signed)
Checking x-ray lumbar and B12 and vitamin D given the weakness. Adjust as needed. Pain is controlled off meds for now.

## 2021-02-10 NOTE — Assessment & Plan Note (Signed)
BP at goal and needs CBC and CMP and lipid panel today. Adjust hctz 12.5 mg daily as needed and this is refilled today.

## 2021-02-12 ENCOUNTER — Other Ambulatory Visit: Payer: Self-pay | Admitting: Internal Medicine

## 2021-02-12 DIAGNOSIS — Z6825 Body mass index (BMI) 25.0-25.9, adult: Secondary | ICD-10-CM | POA: Diagnosis not present

## 2021-02-12 DIAGNOSIS — Z0000 Encounter for general adult medical examination without abnormal findings: Secondary | ICD-10-CM | POA: Diagnosis not present

## 2021-02-12 DIAGNOSIS — I10 Essential (primary) hypertension: Secondary | ICD-10-CM | POA: Diagnosis not present

## 2021-02-12 DIAGNOSIS — Z9981 Dependence on supplemental oxygen: Secondary | ICD-10-CM | POA: Diagnosis not present

## 2021-02-12 DIAGNOSIS — D860 Sarcoidosis of lung: Secondary | ICD-10-CM | POA: Diagnosis not present

## 2021-02-12 DIAGNOSIS — E663 Overweight: Secondary | ICD-10-CM | POA: Diagnosis not present

## 2021-02-12 MED ORDER — VITAMIN D (ERGOCALCIFEROL) 1.25 MG (50000 UNIT) PO CAPS: 50000.0000 [IU] | ORAL_CAPSULE | ORAL | 0 refills | Status: DC

## 2021-02-18 ENCOUNTER — Telehealth: Payer: Self-pay | Admitting: Internal Medicine

## 2021-02-18 NOTE — Telephone Encounter (Signed)
Received disability documents via fax from [employer].  Cover letter says they already have medical records and just need to have the form completed.  It is marked URGENT.  I have emailed to [facility] for completion.

## 2021-02-23 DIAGNOSIS — Z961 Presence of intraocular lens: Secondary | ICD-10-CM | POA: Diagnosis not present

## 2021-02-23 DIAGNOSIS — H35371 Puckering of macula, right eye: Secondary | ICD-10-CM | POA: Diagnosis not present

## 2021-02-23 DIAGNOSIS — Z79899 Other long term (current) drug therapy: Secondary | ICD-10-CM | POA: Diagnosis not present

## 2021-02-23 DIAGNOSIS — H21542 Posterior synechiae (iris), left eye: Secondary | ICD-10-CM | POA: Diagnosis not present

## 2021-02-23 DIAGNOSIS — H30003 Unspecified focal chorioretinal inflammation, bilateral: Secondary | ICD-10-CM | POA: Diagnosis not present

## 2021-02-23 DIAGNOSIS — D860 Sarcoidosis of lung: Secondary | ICD-10-CM | POA: Diagnosis not present

## 2021-02-23 DIAGNOSIS — H18421 Band keratopathy, right eye: Secondary | ICD-10-CM | POA: Diagnosis not present

## 2021-02-23 DIAGNOSIS — H2512 Age-related nuclear cataract, left eye: Secondary | ICD-10-CM | POA: Diagnosis not present

## 2021-02-23 NOTE — Telephone Encounter (Signed)
Forms signed and faxed. Left voicemail to inform patient and to let her know there is a $29 fee that is collected yearly.

## 2021-02-26 DIAGNOSIS — Z1231 Encounter for screening mammogram for malignant neoplasm of breast: Secondary | ICD-10-CM | POA: Diagnosis not present

## 2021-03-03 NOTE — Progress Notes (Signed)
Patient ID: KC SEDLAK, female    DOB: [birth year], 66 y.o.   MRN: [id number]  HPI  Female never smoker followed for Sarcoid III involving eyes/iritis, lungs and skin-bronchoscopy 2000. Failed Plaquenil for skin rash. ACE 02/19/15- elevated 97, down from >100 07/2014.  ACE 05/12/16-   50 (9-67) Office Spirometry 08/06/2015-moderate restriction. FVC 1.53/63%, FEV1 1.19/62%, FEV1/FVC 0.78 Room air saturation at rest on arrival 03/14/16- 88%, desaturating to 85% with ambulation and improving to 98% ambulating on 2 L. PFT 03/16/17-moderately severe restriction, severe reduction of diffusion.  FVC 1.62/66%, FEV1 1.46/76%, ratio 0.90, TLC 50%, DLCO 26% Had VATS/ robotic pleurectomy 02/2019 and again 08/23/19-for recurrent ptx realted to fibrocystic sarcoid. 6 MWT 10/01/19- Arrived RA 96%, desat to 87% on room air, corrected to 91% on 2L. -------------------------------------------------------------------------------------------------------    12/03/20- 66 year old female never smoker followed for Sarcoid III involving eyes/iritis, lungs,rectum, skin-bronchoscopy 2000, chronic hypoxic respiratory failure Had VATS/ robotic pleurodesis 02/2019 and again 08/23/19-for recurrent ptx related to fibrocystic sarcoid ACE in 2018 was down to 50 ( 9-67) ACE 04/06/20- 71 (9-67) persistently high x 10 years O2   1.5-2 L sleep / [facility]     -Trelegy 100, MTX, , Proventil HFA,  Covid vax-3 Phizer Flu vax- Commented on her cold hands, chronic, without blanching or pain.  Explained how this may affect oxygen saturation. Increased shortness of breath x2 weeks and increased her oxygen to 2-4 L. No obvious infection or acute event. CXR 3/114/22- FINDINGS: Bilateral parenchymal lung densities have not significantly changed. Patient has known bullous changes at the lung bases. Heart and mediastinum are stable. Negative for a large pneumothorax. No significant pleural fluid. No acute bone abnormality. The trachea  is midline. IMPRESSION: Chronic lung changes without acute findings.  03/04/21- 66 year old female never smoker followed for Sarcoid III involving eyes/iritis, lungs,rectum, skin-bronchoscopy 2000, chronic hypoxic respiratory failure Had VATS/ robotic pleurodesis 02/2019 and again 08/23/19-for recurrent ptx related to fibrocystic sarcoid ACE in 2018 was down to 50 ( 9-67) ACE 04/06/20- 71 (9-67) persistently high x 10 years O2   1.5-2 L sleep / [facility]     -Trelegy 100, MTX, , Proventil HFA,  Covid vax-3 Phizer Flu vax-had She found that sample Breztri was no better than Trelegy and that twice daily use of Ventolin rescue inhaler has been sufficient for her with no significant breakthrough events.  Stable daily cough usually productive scant white.  No adenopathy or rash.  She continues to use oxygen for sleep. CTa chest PE 12/04/20- IMPRESSION: 1. No evidence of pulmonary embolism. 2. No acute findings in the thorax. 3. Chronic imaging stigmata of sarcoidosis, similar to prior studies. 4. Dilatation of the pulmonic trunk (3.6 cm in diameter), concerning for associated pulmonary arterial hypertension. 5. Status post right talc pleurodesis. 6. Aortic atherosclerosis. Aortic Atherosclerosis (ICD10-I70.0).  Review of Systems- see HPI   + = positive Constitutional:   weight loss, night sweats, fevers, chills, fatigue, lassitude. HEENT:   No-  headaches, difficulty swallowing, tooth/dental problems, sore throat,       No-  sneezing, itching, ear ache, nasal congestion, post nasal drip,  CV:  +chest pain, orthopnea, PND, swelling in lower extremities, anasarca,   dizziness, palpitations Resp:+shortness of breath with exertion or at rest.               +productive cough,   non-productive cough,  No-  coughing up of blood.              No-   change  in color of mucus.  No- wheezing.   Skin: +  rash or lesions. GI:  No-   heartburn, indigestion, abdominal pain, nausea, vomiting,  GU:  MS:   No-   joint pain or swelling.  No- decreased range of motion.  No- back pain. Neuro- nothing unusual Psych:  No- change in mood or affect. No depression or anxiety.  No memory loss.  Objective:   Physical Exam General- Alert, Oriented, Affect-appropriate, Distress- none acute, slender Skin- + multiple small linear plaques along flexure lines left side of neck, down onto upper chest, low back,  in the right posterior axillary line and extensor surfaces of forearms and elbows she has stable macular lesions which she says itch and are tender. + Reddened nonspecific dermatitis dorsum right hand. Lymphadenopathy- none Head- atraumatic            Eyes- Gross vision intact, PERRLA, conjunctivae clear secretions            Ears- Hearing, canals normal            Nose- Clear, No-Septal dev, mucus, polyps, erosion, perforation             Throat- Mallampati IV , mucosa clear , drainage- none, tonsils- atrophic Neck- flexible , trachea midline, no stridor , thyroid nl, carotid no bruit Chest - symmetrical excursion , unlabored           Heart/CV- RRR , no murmur , no gallop  , no rub, nl s1,    +P2 prominent                           - JVD +1 , edema- none, stasis changes- none, varices- none           Lung- +diminished on R, wheeze- none, cough - none , dullness-none, rub- none,                 Chest wall- + 2 cm rubbery somewhat mobile subcutaneous nodule at distal end of left clavicle- probable lipoma Abd- no HSM Br/ Gen/ Rectal- Not done, not indicated Extrem- + cool fingers, cyanosis- none, clubbing, none, atrophy- none, strength- nl, +1 cm rubbery nodule at base R thumb Neuro- grossly intact to observation Assessment & Plan:

## 2021-03-04 ENCOUNTER — Encounter: Payer: Self-pay | Admitting: Internal Medicine

## 2021-03-04 ENCOUNTER — Ambulatory Visit (INDEPENDENT_AMBULATORY_CARE_PROVIDER_SITE_OTHER): Payer: Self-pay | Admitting: Internal Medicine

## 2021-03-04 ENCOUNTER — Other Ambulatory Visit: Payer: Self-pay

## 2021-03-04 VITALS — BP 130/76 | HR 66 | Temp 97.7°F | Ht 62.0 in | Wt 141.2 lb

## 2021-03-04 DIAGNOSIS — D869 Sarcoidosis, unspecified: Secondary | ICD-10-CM

## 2021-03-04 DIAGNOSIS — J849 Interstitial pulmonary disease, unspecified: Secondary | ICD-10-CM

## 2021-03-04 DIAGNOSIS — J9611 Chronic respiratory failure with hypoxia: Secondary | ICD-10-CM

## 2021-03-04 DIAGNOSIS — Z0289 Encounter for other administrative examinations: Secondary | ICD-10-CM

## 2021-03-04 NOTE — Patient Instructions (Signed)
We can continue current meds and oxygen as you are doing  Order- Angiotensin Converting Enzyme level    dx sarcoid

## 2021-03-05 ENCOUNTER — Encounter: Payer: Self-pay | Admitting: Internal Medicine

## 2021-03-05 LAB — ANGIOTENSIN CONVERTING ENZYME: Angiotensin-Converting Enzyme: 49 U/L (ref 9–67)

## 2021-03-05 NOTE — Assessment & Plan Note (Signed)
Benefits from oxygen for sleep. Plan-continue oxygen 1.5-2 L for sleep

## 2021-03-05 NOTE — Assessment & Plan Note (Signed)
Stable fibrotic scarring from old sarcoid.

## 2021-03-05 NOTE — Assessment & Plan Note (Signed)
Significant chronic scarring with some bronchiectasis and chronic bronchitis related to sarcoid which is now burned-out. Plan-ACE level

## 2021-03-14 ENCOUNTER — Other Ambulatory Visit: Payer: Self-pay

## 2021-03-14 ENCOUNTER — Ambulatory Visit
Admission: EM | Admit: 2021-03-14 | Discharge: 2021-03-14 | Disposition: A | Discharge status: Home / Self Care | Payer: Medicare HMO | Attending: Physician Assistant | Admitting: Physician Assistant

## 2021-03-14 ENCOUNTER — Encounter: Payer: Self-pay | Admitting: Emergency Medicine

## 2021-03-14 DIAGNOSIS — M79645 Pain in left finger(s): Secondary | ICD-10-CM | POA: Diagnosis not present

## 2021-03-14 MED ORDER — PREDNISONE 20 MG PO TABS
40.0000 mg | ORAL_TABLET | Freq: Every day | ORAL | 0 refills | Status: AC
Start: 2021-03-14 — End: 2021-03-19

## 2021-03-14 NOTE — ED Provider Notes (Signed)
[facility]    CSN: [id number] Arrival date & time: 03/14/21  1344      History   Chief Complaint Chief Complaint  Patient presents with   Hand Pain    HPI Lauren Massey is a 66 y.o. female.   Patient here today for evaluation of swelling to her left thumb that started about 3 days ago.  She states that swelling and pain have worsened with time.  Pain and swelling are worse at MCP.  She has not had any numbness or tingling.  She denies any injury to the area.  She has not had fever.  The history is provided by the patient.   Past Medical History:  Diagnosis Date   Cataract    right eye   Hypertension    Pneumothorax    Sarcoidosis    skin, eye, lung   Unspecified iridocyclitis     Patient Active Problem List   Diagnosis Date Noted   Acute left-sided low back pain with left-sided sciatica 02/10/2021   History of colonic polyps    Acute respiratory failure with hypoxia ([place]) 08/21/2019   ILD (interstitial lung disease) ([place]) 05/31/2019   Essential hypertension 04/12/2019   Pneumothorax on right 03/06/2019   Pneumothorax 02/22/2019   Chronic respiratory failure with hypoxia ([place]) 03/17/2017   Routine general medical examination at a health care facility 02/02/2017   Loss of weight 08/03/2015   SARCOIDOSIS, PULMONARY 05/18/2007   Iridocyclitis 05/18/2007    Past Surgical History:  Procedure Laterality Date   ABDOMINAL HYSTERECTOMY     APPENDECTOMY     BRONCHOSCOPY  2003   CATARACT EXTRACTION     COLONOSCOPY WITH PROPOFOL N/A 05/21/2020   Procedure: COLONOSCOPY WITH PROPOFOL;  Surgeon: Mauri Pole, MD;  Location: [facility];  Service: Endoscopy;  Laterality: N/A;   INTERCOSTAL NERVE BLOCK Right 03/12/2019   Procedure: Intercostal Nerve Block;  Surgeon: Grace Isaac, MD;  Location: [place];  Service: Thoracic;  Laterality: Right;   PLEURADESIS Right 08/23/2019   Procedure: MECHANICAL PLEURODESIS;  Surgeon: Grace Isaac, MD;   Location: [place];  Service: Thoracic;  Laterality: Right;   TALC PLEURODESIS Right 08/23/2019   Procedure: Pietro Cassis;  Surgeon: Grace Isaac, MD;  Location: [place];  Service: Thoracic;  Laterality: Right;   VESICOVAGINAL FISTULA CLOSURE W/ TAH     VIDEO ASSISTED THORACOSCOPY Right 08/23/2019   Procedure: VIDEO ASSISTED THORACOSCOPY;  Surgeon: Grace Isaac, MD;  Location: [place];  Service: Thoracic;  Laterality: Right;   VIDEO BRONCHOSCOPY N/A 03/12/2019   Procedure: Video Bronchoscopy;  Surgeon: Grace Isaac, MD;  Location: [place];  Service: Thoracic;  Laterality: N/A;    OB History   No obstetric history on file.      Home Medications    Prior to Admission medications   Medication Sig Start Date End Date Taking? Authorizing Provider  predniSONE (DELTASONE) 20 MG tablet Take 2 tablets (40 mg total) by mouth daily with breakfast for 5 days. 03/14/21 03/19/21 Yes Francene Finders, PA-C  albuterol (VENTOLIN HFA) 108 (90 Base) MCG/ACT inhaler Inhale 2 puffs into the lungs every 6 (six) hours as needed for wheezing or shortness of breath. 12/03/20   Deneise Lever, MD  folic acid (FOLVITE) 1 MG tablet Take 5 mg by mouth at bedtime.     [provider]  hydrochlorothiazide (MICROZIDE) 12.5 MG capsule Take 1 capsule (12.5 mg total) by mouth daily. 02/09/21   Pricilla Holm  A, MD  methotrexate (RHEUMATREX) 2.5 MG tablet Take 7.5 mg by mouth every Wednesday. Caution:Chemotherapy. Protect from light. Takes 3 tabs a week    [provider]  prednisoLONE acetate (PRED FORTE) 1 % ophthalmic suspension Place 1 drop into the right eye in the morning, at noon, and at bedtime. 10/28/20   [provider]  Vitamin D, Ergocalciferol, (DRISDOL) 1.25 MG (50000 UNIT) CAPS capsule Take 1 capsule (50,000 Units total) by mouth every 7 (seven) days. 02/12/21   Hoyt Koch, MD    Family History Family History  Problem Relation Age of Onset   Alzheimer's  disease Mother    Cancer Father    Heart disease Father    Diabetes Other    Colon cancer Neg Hx    Esophageal cancer Neg Hx    Pancreatic cancer Neg Hx    Stomach cancer Neg Hx     Social History Social History   Tobacco Use   Smoking status: Never   Smokeless tobacco: Never  Vaping Use   Vaping Use: Never used  Substance Use Topics   Alcohol use: No   Drug use: No     Allergies   Patient has no known allergies.   Review of Systems Review of Systems  Constitutional:  Negative for chills and fever.  Eyes:  Negative for discharge and redness.  Gastrointestinal:  Negative for abdominal pain, nausea and vomiting.  Musculoskeletal:  Positive for arthralgias and joint swelling.  Skin:  Positive for color change. Negative for wound.    Physical Exam Triage Vital Signs ED Triage Vitals  Enc Vitals Group     BP      Pulse      Resp      Temp      Temp src      SpO2      Weight      Height      Head Circumference      Peak Flow      Pain Score      Pain Loc      Pain Edu?      Excl. in [place]?    No data found.  Updated Vital Signs BP (!) 144/89 (BP Location: Right Arm)    Pulse 68    Temp 97.9 F (36.6 C) (Oral)    Resp 18    SpO2 98%   Physical Exam Vitals and nursing note reviewed.  Constitutional:      General: She is not in acute distress.    Appearance: Normal appearance. She is not ill-appearing.  HENT:     Head: Normocephalic and atraumatic.  Eyes:     Conjunctiva/sclera: Conjunctivae normal.  Cardiovascular:     Rate and Rhythm: Normal rate.  Pulmonary:     Effort: Pulmonary effort is normal.  Musculoskeletal:     Comments: Diffuse swelling at the left first MCP with erythema, tenderness palpation noted to same.  Normal range of motion of left fingers with mild decrease in range of motion of thumb due to swelling  Neurological:     Mental Status: She is alert.  Psychiatric:        Mood and Affect: Mood normal.        Behavior: Behavior  normal.        Thought Content: Thought content normal.     UC Treatments / Results  Labs (all labs ordered are listed, but only abnormal results are displayed) Labs Reviewed - No data to  display  EKG   Radiology No results found.  Procedures Procedures (including critical care time)  Medications Ordered in UC Medications - No data to display  Initial Impression / Assessment and Plan / UC Course  I have reviewed the triage vital signs and the nursing notes.  Pertinent labs & imaging results that were available during my care of the patient were reviewed by me and considered in my medical decision making (see chart for details).    We will treat with steroids as there is suspicion for gout at this point.  Recommended follow-up with PCP if symptoms do not improve over the next 24 to 48 hours or sooner with any development of fever or other concerns.  Final Clinical Impressions(s) / UC Diagnoses   Final diagnoses:  Pain of left thumb   Discharge Instructions   None    ED Prescriptions     Medication Sig Dispense Auth. Provider   predniSONE (DELTASONE) 20 MG tablet Take 2 tablets (40 mg total) by mouth daily with breakfast for 5 days. 10 tablet Francene Finders, PA-C      PDMP not reviewed this encounter.   Francene Finders, PA-C 03/14/21 1542

## 2021-03-14 NOTE — ED Triage Notes (Signed)
Pt here for left thumb pain with redness and swelling x 3 days; denies injury

## 2021-05-06 ENCOUNTER — Telehealth: Payer: Self-pay | Admitting: Internal Medicine

## 2021-05-06 ENCOUNTER — Other Ambulatory Visit: Payer: Self-pay | Admitting: Internal Medicine

## 2021-05-06 NOTE — Telephone Encounter (Signed)
N/A uanble to leave a message for patient to call back to schedule Medicare Annual Wellness Visit  ? ?No hx of AWV eligible as of 09/24/20 ? ?Please schedule at anytime with [facility] if patient calls the office back.   ? ? ?Any questions, please call me at [phone]  ?

## 2021-05-06 NOTE — Telephone Encounter (Signed)
Patinet returning my call in regards to scheduling her AWV. ? ?Patient also is requesting a refill on her HCTZ, she states she is out and needs a refll sent to [facility]. ?

## 2021-05-10 ENCOUNTER — Ambulatory Visit (INDEPENDENT_AMBULATORY_CARE_PROVIDER_SITE_OTHER): Payer: Medicare HMO

## 2021-05-10 DIAGNOSIS — Z0000 Encounter for general adult medical examination without abnormal findings: Secondary | ICD-10-CM | POA: Diagnosis not present

## 2021-05-10 NOTE — Patient Instructions (Addendum)
Lauren Massey , ?Thank you for taking time to come for your Medicare Wellness Visit. I appreciate your ongoing commitment to your health goals. Please review the following plan we discussed and let me know if I can assist you in the future.  ? ?Screening recommendations/referrals: ?Colonoscopy: 05/21/2020; due every 3 years ?Mammogram: 03/2021; due every year (per patient) ?Bone Density: 02/05/2018; due every 2-3 years (unless stated otherwise by PCP) ?Recommended yearly ophthalmology/optometry visit for glaucoma screening and checkup ?Recommended yearly dental visit for hygiene and checkup ? ?Vaccinations: ?Influenza vaccine: 12/03/2020 ?Pneumococcal vaccine: 04/06/2020 ?Tdap vaccine: 02/05/2018 ?Shingles vaccine: never done   ?Covid-19: 04/13/2019, 05/04/2019 ? ?Advanced directives: Yes; Please bring a copy of your health care power of attorney and living will to the office at your convenience. ? ?Conditions/risks identified: Yes ? ?Next appointment: Please schedule your next Medicare Wellness Visit with your Nurse Health Advisor in 1 year by calling [phone]. ? ? ?Preventive Care 72 Years and Older, Female ?Preventive care refers to lifestyle choices and visits with your health care provider that can promote health and wellness. ?What does preventive care include? ?A yearly physical exam. This is also called an annual well check. ?Dental exams once or twice a year. ?Routine eye exams. Ask your health care provider how often you should have your eyes checked. ?Personal lifestyle choices, including: ?Daily care of your teeth and gums. ?Regular physical activity. ?Eating a healthy diet. ?Avoiding tobacco and drug use. ?Limiting alcohol use. ?Practicing safe sex. ?Taking low-dose aspirin every day. ?Taking vitamin and mineral supplements as recommended by your health care provider. ?What happens during an annual well check? ?The services and screenings done by your health care provider during your annual well check will  depend on your age, overall health, lifestyle risk factors, and family history of disease. ?Counseling  ?Your health care provider may ask you questions about your: ?Alcohol use. ?Tobacco use. ?Drug use. ?Emotional well-being. ?Home and relationship well-being. ?Sexual activity. ?Eating habits. ?History of falls. ?Memory and ability to understand (cognition). ?Work and work Statistician. ?Reproductive health. ?Screening  ?You may have the following tests or measurements: ?Height, weight, and BMI. ?Blood pressure. ?Lipid and cholesterol levels. These may be checked every 5 years, or more frequently if you are over 72 years old. ?Skin check. ?Lung cancer screening. You may have this screening every year starting at age 72 if you have a 30-pack-year history of smoking and currently smoke or have quit within the past 15 years. ?Fecal occult blood test (FOBT) of the stool. You may have this test every year starting at age 67. ?Flexible sigmoidoscopy or colonoscopy. You may have a sigmoidoscopy every 5 years or a colonoscopy every 10 years starting at age 30. ?Hepatitis C blood test. ?Hepatitis B blood test. ?Sexually transmitted disease (STD) testing. ?Diabetes screening. This is done by checking your blood sugar (glucose) after you have not eaten for a while (fasting). You may have this done every 1-3 years. ?Bone density scan. This is done to screen for osteoporosis. You may have this done starting at age 52. ?Mammogram. This may be done every 1-2 years. Talk to your health care provider about how often you should have regular mammograms. ?Talk with your health care provider about your test results, treatment options, and if necessary, the need for more tests. ?Vaccines  ?Your health care provider may recommend certain vaccines, such as: ?Influenza vaccine. This is recommended every year. ?Tetanus, diphtheria, and acellular pertussis (Tdap, Td) vaccine. You may need a Td  booster every 10 years. ?Zoster vaccine. You may  need this after age 56. ?Pneumococcal 13-valent conjugate (PCV13) vaccine. One dose is recommended after age 33. ?Pneumococcal polysaccharide (PPSV23) vaccine. One dose is recommended after age 31. ?Talk to your health care provider about which screenings and vaccines you need and how often you need them. ?This information is not intended to replace advice given to you by your health care provider. Make sure you discuss any questions you have with your health care provider. ?Document Released: 02/06/2015 Document Revised: 09/30/2015 Document Reviewed: 11/11/2014 ?Elsevier Interactive Patient Education ? 2017 [place]. ? ?Fall Prevention in the Home ?Falls can cause injuries. They can happen to people of all ages. There are many things you can do to make your home safe and to help prevent falls. ?What can I do on the outside of my home? ?Regularly fix the edges of walkways and driveways and fix any cracks. ?Remove anything that might make you trip as you walk through a door, such as a raised step or threshold. ?Trim any bushes or trees on the path to your home. ?Use bright outdoor lighting. ?Clear any walking paths of anything that might make someone trip, such as rocks or tools. ?Regularly check to see if handrails are loose or broken. Make sure that both sides of any steps have handrails. ?Any raised decks and porches should have guardrails on the edges. ?Have any leaves, snow, or ice cleared regularly. ?Use sand or salt on walking paths during winter. ?Clean up any spills in your garage right away. This includes oil or grease spills. ?What can I do in the bathroom? ?Use night lights. ?Install grab bars by the toilet and in the tub and shower. Do not use towel bars as grab bars. ?Use non-skid mats or decals in the tub or shower. ?If you need to sit down in the shower, use a plastic, non-slip stool. ?Keep the floor dry. Clean up any water that spills on the floor as soon as it happens. ?Remove soap buildup in  the tub or shower regularly. ?Attach bath mats securely with double-sided non-slip rug tape. ?Do not have throw rugs and other things on the floor that can make you trip. ?What can I do in the bedroom? ?Use night lights. ?Make sure that you have a light by your bed that is easy to reach. ?Do not use any sheets or blankets that are too big for your bed. They should not hang down onto the floor. ?Have a firm chair that has side arms. You can use this for support while you get dressed. ?Do not have throw rugs and other things on the floor that can make you trip. ?What can I do in the kitchen? ?Clean up any spills right away. ?Avoid walking on wet floors. ?Keep items that you use a lot in easy-to-reach places. ?If you need to reach something above you, use a strong step stool that has a grab bar. ?Keep electrical cords out of the way. ?Do not use floor polish or wax that makes floors slippery. If you must use wax, use non-skid floor wax. ?Do not have throw rugs and other things on the floor that can make you trip. ?What can I do with my stairs? ?Do not leave any items on the stairs. ?Make sure that there are handrails on both sides of the stairs and use them. Fix handrails that are broken or loose. Make sure that handrails are as long as the stairways. ?Check any carpeting  to make sure that it is firmly attached to the stairs. Fix any carpet that is loose or worn. ?Avoid having throw rugs at the top or bottom of the stairs. If you do have throw rugs, attach them to the floor with carpet tape. ?Make sure that you have a light switch at the top of the stairs and the bottom of the stairs. If you do not have them, ask someone to add them for you. ?What else can I do to help prevent falls? ?Wear shoes that: ?Do not have high heels. ?Have rubber bottoms. ?Are comfortable and fit you well. ?Are closed at the toe. Do not wear sandals. ?If you use a stepladder: ?Make sure that it is fully opened. Do not climb a closed  stepladder. ?Make sure that both sides of the stepladder are locked into place. ?Ask someone to hold it for you, if possible. ?Clearly mark and make sure that you can see: ?Any grab bars or handrails. ?First and

## 2021-05-10 NOTE — Progress Notes (Signed)
?I connected with Lauren Massey today by telephone and verified that I am speaking with the correct person using two identifiers. ?Location patient: home ?Location provider: work ?Persons participating in the virtual visit: patient, provider. ?  ?I discussed the limitations, risks, security and privacy concerns of performing an evaluation and management service by telephone and the availability of in person appointments. I also discussed with the patient that there may be a patient responsible charge related to this service. The patient expressed understanding and verbally consented to this telephonic visit.  ?  ?Interactive audio and video telecommunications were attempted between this provider and patient, however failed, due to patient having technical difficulties OR patient did not have access to video capability.  We continued and completed visit with audio only. ? ?Some vital signs may be absent or patient reported.  ? ?Time Spent with patient on telephone encounter: 30 minutes ? ?Subjective:  ? Lauren Massey is a 66 y.o. female who presents for Medicare Annual (Subsequent) preventive examination. ? ?Review of Systems    ? ?Cardiac Risk Factors include: advanced age (>76mn, >>18women);family history of premature cardiovascular disease;hypertension ? ?   ?Objective:  ?  ?There were no vitals filed for this visit. ?There is no height or weight on file to calculate BMI. ? ? ?  05/10/2021  ? 10:35 AM 05/21/2020  ?  9:39 AM 08/26/2019  ? 10:00 PM 08/22/2019  ?  6:00 AM 07/16/2019  ?  9:21 PM 07/16/2019  ?  1:15 PM 03/06/2019  ? 10:13 PM  ?Advanced Directives  ?Does Patient Have a Medical Advance Directive? Yes Yes Yes Yes Yes No No  ?Type of Advance Directive Living will;Healthcare Power of [place]outhside PlaceLiving will Healthcare Power of Attorney      ?Does patient want to make changes to medical advance directive? No - Patient declined  No - Patient declined      ?[place] of [place]? No - copy requested No - copy requested Yes - validated most recent copy scanned in chart (See row information)      ?Would patient like information on creating a medical advance directive?       No - Patient declined  ? ? ?Current Medications (verified) ?Outpatient Encounter Medications as of 05/10/2021  ?Medication Sig  ? albuterol (VENTOLIN HFA) 108 (90 Base) MCG/ACT inhaler Inhale 2 puffs into the lungs every 6 (six) hours as needed for wheezing or shortness of breath.  ? folic acid (FOLVITE) 1 MG tablet Take 5 mg by mouth at bedtime.   ? hydrochlorothiazide (MICROZIDE) 12.5 MG capsule TAKE 1 CAPSULE(12.5 MG) BY MOUTH DAILY  ? methotrexate (RHEUMATREX) 2.5 MG tablet Take 7.5 mg by mouth every Wednesday. Caution:Chemotherapy. Protect from light. Takes 3 tabs a week  ? prednisoLONE acetate (PRED FORTE) 1 % ophthalmic suspension Place 1 drop into the right eye in the morning, at noon, and at bedtime.  ? Vitamin D, Ergocalciferol, (DRISDOL) 1.25 MG (50000 UNIT) CAPS capsule Take 1 capsule (50,000 Units total) by mouth every 7 (seven) days. (Patient not taking: Reported on 05/10/2021)  ? ?No facility-administered encounter medications on file as of 05/10/2021.  ? ? ?Allergies (verified) ?Patient has no known allergies.  ? ?History: ?Past Medical History:  ?Diagnosis Date  ? Cataract   ? right eye  ? Hypertension   ? Pneumothorax   ? Sarcoidosis   ? skin, eye, lung  ? Unspecified iridocyclitis   ? ?Past Surgical  History:  ?Procedure Laterality Date  ? ABDOMINAL HYSTERECTOMY    ? APPENDECTOMY    ? BRONCHOSCOPY  2003  ? CATARACT EXTRACTION    ? COLONOSCOPY WITH PROPOFOL N/A 05/21/2020  ? Procedure: COLONOSCOPY WITH PROPOFOL;  Surgeon: Mauri Pole, MD;  Location: [facility];  Service: Endoscopy;  Laterality: N/A;  ? INTERCOSTAL NERVE BLOCK Right 03/12/2019  ? Procedure: Intercostal Nerve Block;  Surgeon: Grace Isaac, MD;  Location[facility];  Service: Thoracic;  Laterality: Right;  ?  PLEURADESIS Right 08/23/2019  ? Procedure: MECHANICAL PLEURODESIS;  Surgeon: Grace Isaac, MD;  Location: [place];  Service: Thoracic;  Laterality: Right;  ? TALC PLEURODESIS Right 08/23/2019  ? Procedure: Pietro Cassis;  Surgeon: Grace Isaac, MD;  Location: [place];  Service: Thoracic;  Laterality: Right;  ? VESICOVAGINAL FISTULA CLOSURE W/ TAH    ? VIDEO ASSISTED THORACOSCOPY Right 08/23/2019  ? Procedure: VIDEO ASSISTED THORACOSCOPY;  Surgeon: Grace Isaac, MD;  Location: [place];  Service: Thoracic;  Laterality: Right;  ? VIDEO BRONCHOSCOPY N/A 03/12/2019  ? Procedure: Video Bronchoscopy;  Surgeon: Grace Isaac, MD;  Location: [place];  Service: Thoracic;  Laterality: N/A;  ? ?Family History  ?Problem Relation Age of Onset  ? Alzheimer's disease Mother   ? Cancer Father   ? Heart disease Father   ? Diabetes Other   ? Colon cancer Neg Hx   ? Esophageal cancer Neg Hx   ? Pancreatic cancer Neg Hx   ? Stomach cancer Neg Hx   ? ?Social History  ? ?Socioeconomic History  ? Marital status: Married  ?  Spouse name: Not on file  ? Number of children: 3  ? Years of education: Not on file  ? Highest education level: Not on file  ?Occupational History  ? 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  Comment: [employer]  ?Tobacco Use  ? Smoking status: Never  ? Smokeless tobacco: Never  ?Vaping Use  ? Vaping Use: Never used  ?Substance and Sexual Activity  ? Alcohol use: No  ? Drug use: No  ? Sexual activity: Not on file  ?Other Topics Concern  ? Not on file  ?Social History Narrative  ? Not on file  ? ?Social Determinants of Health  ? ?Financial Resource Strain: Low Risk   ? Difficulty of Paying Living Expenses: Not hard at all  ?Food Insecurity: No Food Insecurity  ? Worried About Charity fundraiser in the Last Year: Never true  ? Ran Out of Food in the Last Year: Never true  ?Transportation Needs: No Transportation Needs  ? Lack of Transportation (Medical): No  ? Lack of Transportation (Non-Medical): No  ?Physical  Activity: Inactive  ? Days of Exercise per Week: 0 days  ? Minutes of Exercise per Session: 0 min  ?Stress: No Stress Concern Present  ? Feeling of Stress : Not at all  ?Social Connections: Socially Integrated  ? Frequency of Communication with Friends and Family: More than three times a week  ? Frequency of Social Gatherings with Friends and Family: More than three times a week  ? Attends Religious Services: More than 4 times per year  ? Active Member of Clubs or Organizations: Yes  ? Attends Archivist Meetings: More than 4 times per year  ? Marital Status: Married  ? ? ?Tobacco Counseling ?Counseling given: Not Answered ? ? ?Clinical Intake: ? ?Pre-visit preparation completed: Yes ? ?Pain : No/denies pain ? ?  ? ?Nutritional  Risks: None ?Diabetes: No ? ?How often do you need to have someone help you when you read instructions, pamphlets, or other written materials from your doctor or pharmacy?: 1 - Never ?What is the last grade level you completed in school?: 10th grade ? ?Diabetic? no ? ?Interpreter Needed?: No ? ?Information entered by :: Lisette Abu, LPN ? ? ?Activities of Daily Living ? ?  05/10/2021  ? 10:47 AM 02/09/2021  ?  2:29 PM  ?In your present state of health, do you have any difficulty performing the following activities:  ?Hearing? 0 0  ?Vision? 0 0  ?Difficulty concentrating or making decisions? 0 0  ?Walking or climbing stairs? 0 0  ?Dressing or bathing? 0 0  ?Doing errands, shopping? 0 0  ?Preparing Food and eating ? N   ?Using the Toilet? N   ?In the past six months, have you accidently leaked urine? N   ?Do you have problems with loss of bowel control? N   ?Managing your Medications? N   ?Managing your Finances? N   ?Housekeeping or managing your Housekeeping? N   ? ? ?Patient Care Team: ?Hoyt Koch, MD as PCP - General (Internal Medicine) ?Donato Heinz, MD as PCP - Cardiology (Cardiology) ? ?Indicate any recent Medical Services you may have received from  other than [facility] providers in the past year (date may be approximate). ? ?   ?Assessment:  ? This is a routine wellness examination for [place]. ? ?Hearing/Vision screen ?Hearing Screening - Comments:: Patient denie

## 2021-07-01 DIAGNOSIS — H524 Presbyopia: Secondary | ICD-10-CM | POA: Diagnosis not present

## 2021-07-21 ENCOUNTER — Ambulatory Visit: Payer: Medicare HMO | Admitting: Cardiology

## 2021-07-21 ENCOUNTER — Encounter: Payer: Self-pay | Admitting: Cardiology

## 2021-07-21 VITALS — BP 120/76 | HR 62 | Ht 62.0 in | Wt 135.6 lb

## 2021-07-21 DIAGNOSIS — R002 Palpitations: Secondary | ICD-10-CM | POA: Diagnosis not present

## 2021-07-21 DIAGNOSIS — R079 Chest pain, unspecified: Secondary | ICD-10-CM | POA: Diagnosis not present

## 2021-07-21 DIAGNOSIS — I10 Essential (primary) hypertension: Secondary | ICD-10-CM | POA: Diagnosis not present

## 2021-07-21 DIAGNOSIS — D869 Sarcoidosis, unspecified: Secondary | ICD-10-CM

## 2021-07-21 NOTE — Patient Instructions (Signed)
Medication Instructions:  Your physician recommends that you continue on your current medications as directed. Please refer to the Current Medication list given to you today.  *If you need a refill on your cardiac medications before your next appointment, please call your pharmacy*  Testing/Procedures: Your physician has requested that you have an echocardiogram. Echocardiography is a painless test that uses sound waves to create images of your heart. It provides your doctor with information about the size and shape of your heart and how well your heart's chambers and valves are working. This procedure takes approximately one hour. There are no restrictions for this procedure.  Follow-Up: At [facility], you and your health needs are our priority.  As part of our continuing mission to provide you with exceptional heart care, we have created designated Provider Care Teams.  These Care Teams include your primary Cardiologist (physician) and Advanced Practice Providers (APPs -  Physician Assistants and Nurse Practitioners) who all work together to provide you with the care you need, when you need it.  We recommend signing up for the patient portal called "MyChart".  Sign up information is provided on this After Visit Summary.  MyChart is used to connect with patients for Virtual Visits (Telemedicine).  Patients are able to view lab/test results, encounter notes, upcoming appointments, etc.  Non-urgent messages can be sent to your provider as well.   To learn more about what you can do with MyChart, go to [email or web address].    Your next appointment:   12 month(s)  The format for your next appointment:   In Person  Provider:   Donato Heinz, MD {    Important Information About Sugar

## 2021-07-21 NOTE — Progress Notes (Signed)
Cardiology Office Note:    Date:  07/21/2021   ID:  Lauren Massey, DOB 1956/01/15, MRN [id number]  PCP:  Hoyt Koch, MD  Cardiologist:  Donato Heinz, MD  Electrophysiologist:  None   Referring MD: Hoyt Koch, *   No chief complaint on file.   History of Present Illness:    Lauren Massey is a 66 y.o. female with a hx of pulmonary sarcoidosis, hypertension who is being seen today for follow-up.  She was admitted to [facility] for VATS/robotic pleurectomy due to recurrent pneumothoraces in 02/2019.  Cardiology was consulted for preop evaluation and consideration of cardiac sarcoid eval.  TTE showed normal LV systolic function, normal RV function, mild PASP elevation.  No further cardiac work-up was recommended prior to surgery.  Outpatient follow-up for cardiac MRI was recommended.  She tolerated her procedure well, postoperative course was uncomplicated.  Cardiac MRI was done on 04/05/2019, which showed normal LV systolic function (EF 32%), normal RV systolic function (EF 95%).  Inferior RV insertion site LGE consistent with elevated pulmonary pressures, no evidence of cardiac sarcoid.  Zio patch x14 days on 05/07/2020 showed 1 episode of NSVT lasting 4 beats, 380 episodes of SVT (appeared atrial tachycardia) with longest lasting 25 seconds with average rate 111 bpm.  Since last clinic visit, she reports that she has been doing well.  Denies any chest pain, dyspnea, lightheadedness, syncope, lower extremity edema.  Reports palpitations, but occurring rarely, has just been a few times per year.   Past Medical History:  Diagnosis Date   Cataract    right eye   Hypertension    Pneumothorax    Sarcoidosis    skin, eye, lung   Unspecified iridocyclitis     Past Surgical History:  Procedure Laterality Date   ABDOMINAL HYSTERECTOMY     APPENDECTOMY     BRONCHOSCOPY  2003   CATARACT EXTRACTION     COLONOSCOPY WITH PROPOFOL N/A 05/21/2020   Procedure:  COLONOSCOPY WITH PROPOFOL;  Surgeon: Mauri Pole, MD;  Location: [facility];  Service: Endoscopy;  Laterality: N/A;   INTERCOSTAL NERVE BLOCK Right 03/12/2019   Procedure: Intercostal Nerve Block;  Surgeon: Grace Isaac, MD;  Location: [place];  Service: Thoracic;  Laterality: Right;   PLEURADESIS Right 08/23/2019   Procedure: MECHANICAL PLEURODESIS;  Surgeon: Grace Isaac, MD;  Location: [place];  Service: Thoracic;  Laterality: Right;   TALC PLEURODESIS Right 08/23/2019   Procedure: Pietro Cassis;  Surgeon: Grace Isaac, MD;  Location: [place];  Service: Thoracic;  Laterality: Right;   VESICOVAGINAL FISTULA CLOSURE W/ TAH     VIDEO ASSISTED THORACOSCOPY Right 08/23/2019   Procedure: VIDEO ASSISTED THORACOSCOPY;  Surgeon: Grace Isaac, MD;  Location: [place];  Service: Thoracic;  Laterality: Right;   VIDEO BRONCHOSCOPY N/A 03/12/2019   Procedure: Video Bronchoscopy;  Surgeon: Grace Isaac, MD;  Location: [facility];  Service: Thoracic;  Laterality: N/A;    Current Medications: Current Meds  Medication Sig   albuterol (VENTOLIN HFA) 108 (90 Base) MCG/ACT inhaler Inhale 2 puffs into the lungs every 6 (six) hours as needed for wheezing or shortness of breath.   folic acid (FOLVITE) 1 MG tablet Take 5 mg by mouth at bedtime.    hydrochlorothiazide (MICROZIDE) 12.5 MG capsule TAKE 1 CAPSULE(12.5 MG) BY MOUTH DAILY   methotrexate (RHEUMATREX) 2.5 MG tablet Take 7.5 mg by mouth every Wednesday. Caution:Chemotherapy. Protect from light. Takes 3 tabs a week  OXYGEN Inhale into the lungs. 1.5 liters At night and during the day when she's active she uses 2-3 liters of oxygen   prednisoLONE acetate (PRED FORTE) 1 % ophthalmic suspension Place 1 drop into the right eye in the morning, at noon, and at bedtime.     Allergies:   Patient has no known allergies.   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  Tobacco Use   Smoking status: Never   Smokeless tobacco: Never  Vaping Use   Vaping Use: Never used  Substance and Sexual Activity   Alcohol use: No   Drug use: No   Sexual activity: Not on file  Other Topics Concern   Not on file  Social History Narrative   Not on file   Social Determinants of Health   Financial Resource Strain: Low Risk  (05/10/2021)   Overall Financial Resource Strain (CARDIA)    Difficulty of Paying Living Expenses: Not hard at all  Food Insecurity: No Food Insecurity (05/10/2021)   Hunger Vital Sign    Worried About Running Out of Food in the Last Year: Never true    Ran Out of Food in the Last Year: Never true  Transportation Needs: No Transportation Needs (05/10/2021)   PRAPARE - Hydrologist (Medical): No    Lack of Transportation (Non-Medical): No  Physical Activity: Inactive (05/10/2021)   Exercise Vital Sign    Days of Exercise per Week: 0 days    Minutes of Exercise per Session: 0 min  Stress: No Stress Concern Present (05/10/2021)   [place]    Feeling of Stress : Not at all  Social Connections: [place] (05/10/2021)   Social Connection and Isolation Panel [NHANES]    Frequency of Communication with Friends and Family: More than three times a week    Frequency of Social Gatherings with Friends and Family: More than three times a week    Attends Religious Services: More than 4 times per year    Active Member of [employer] or Organizations: Yes    Attends Music therapist: More than 4 times per year    Marital Status: Married     Family History: The patient's family history includes Alzheimer's disease in her mother; Cancer in her father; Diabetes in an other family  member; Heart disease in her father. There is no history of Colon cancer, Esophageal cancer, Pancreatic cancer, or Stomach cancer.  ROS:   Please see the history of present illness.     All other systems reviewed and are negative.  EKGs/Labs/Other Studies Reviewed:    The following studies were reviewed today:   EKG:   07/21/2021: Normal sinus rhythm, rate 62, no ST abnormalities   Recent Labs: 02/09/2021: ALT 16; BUN 17; Creatinine, Ser 0.73; Hemoglobin 12.1; Platelets 177.0; Potassium 3.6; Sodium 136; TSH 0.84  Recent Lipid Panel    Component Value Date/Time   CHOL 245 (H) 02/09/2021 1508   TRIG 101.0 02/09/2021 1508   HDL 67.70 02/09/2021 1508   CHOLHDL 4 02/09/2021 1508   VLDL 20.2 02/09/2021 1508   LDLCALC 157 (H) 02/09/2021 1508   TTE 03/08/19: 1. Left ventricular  ejection fraction, by estimation, is 60 to 65%. The  left ventricle has normal function. The left ventricle has no regional  wall motion abnormalities. Left ventricular diastolic parameters were  normal.   2. Right ventricular systolic function is normal. The right ventricular  size is normal. There is mildly elevated pulmonary artery systolic  pressure.   3. The mitral valve is normal in structure and function. Trivial mitral  valve regurgitation. No evidence of mitral stenosis.   4. The aortic valve is tricuspid. Aortic valve regurgitation is not  visualized. Mild aortic valve sclerosis is present, with no evidence of  aortic valve stenosis.   CMR 04/07/19: 1. Normal biventricular chamber size and systolic function. LVEF 68%, RVEF 53%.   2. Possible small focus of late gadolinium enhancement in the left ventricular myocardium at the inferior RV insertion point at the mid ventricle. This finding can be seen with increased pulmonary pressures.   3.  No definite findings suggestive of cardiac sarcoidosis.    Physical Exam:    VS:  BP 120/76   Pulse 62   Ht '5\' 2"'$  (1.575 m)   Wt 135 lb 9.6 oz (61.5  kg)   BMI 24.80 kg/m     Wt Readings from Last 3 Encounters:  07/21/21 135 lb 9.6 oz (61.5 kg)  03/04/21 141 lb 3.2 oz (64 kg)  02/09/21 138 lb 6.4 oz (62.8 kg)     GEN:  in no acute distress HEENT: Normal NECK: No JVD CARDIAC: RRR, no murmurs, rubs, gallops RESPIRATORY:  Clear to auscultation without rales, wheezing or rhonchi  ABDOMEN: Soft, non-tender, non-distended MUSCULOSKELETAL:  No edema; No deformity  SKIN: Warm and dry NEUROLOGIC:  Alert and oriented x 3 PSYCHIATRIC:  Normal affect   ASSESSMENT:    1. Sarcoidosis   2. Palpitations   3. Chest pain of uncertain etiology   4. Essential hypertension     PLAN:     SVT: Zio patch x14 days on 05/07/2020 showed 1 episode of NSVT lasting 4 beats, 380 episodes of SVT (appeared atrial tachycardia) with longest lasting 25 seconds with average rate 111 bpm.  Given short duration and appeared asymptomatic, was not started on treatment.  Sarcoidosis: Known pulmonary sarcoid, cardiac MRI 04/05/19 shows no evidence of cardiac sarcoid. -Will check echocardiogram  Chest pain: Description suggest noncardiac chest pain, as describes sharp pain lasting few seconds and resolves.  Denies any recent chest pain, no further cardiac work-up recommended at this time  Hypertension: On hydrochlorothiazide 12.5 mg daily.  Appears controlled  RTC in 1 year  Medication Adjustments/Labs and Tests Ordered: Current medicines are reviewed at length with the patient today.  Concerns regarding medicines are outlined above.  Orders Placed This Encounter  Procedures   EKG 12-Lead   ECHOCARDIOGRAM COMPLETE   No orders of the defined types were placed in this encounter.   Patient Instructions  Medication Instructions:  Your physician recommends that you continue on your current medications as directed. Please refer to the Current Medication list given to you today.  *If you need a refill on your cardiac medications before your next appointment,  please call your pharmacy*  Testing/Procedures: Your physician has requested that you have an echocardiogram. Echocardiography is a painless test that uses sound waves to create images of your heart. It provides your doctor with information about the size and shape of your heart and how well your heart's chambers and valves are working. This procedure takes approximately one hour. There are no  restrictions for this procedure.  Follow-Up: At [facility], you and your health needs are our priority.  As part of our continuing mission to provide you with exceptional heart care, we have created designated Provider Care Teams.  These Care Teams include your primary Cardiologist (physician) and Advanced Practice Providers (APPs -  Physician Assistants and Nurse Practitioners) who all work together to provide you with the care you need, when you need it.  We recommend signing up for the patient portal called "MyChart".  Sign up information is provided on this After Visit Summary.  MyChart is used to connect with patients for Virtual Visits (Telemedicine).  Patients are able to view lab/test results, encounter notes, upcoming appointments, etc.  Non-urgent messages can be sent to your provider as well.   To learn more about what you can do with MyChart, go to [email or web address].    Your next appointment:   12 month(s)  The format for your next appointment:   In Person  Provider:   Donato Heinz, MD {    Important Information About Sugar         Signed, Donato Heinz, MD  07/21/2021 10:13 AM    [place]

## 2021-07-23 DIAGNOSIS — H18421 Band keratopathy, right eye: Secondary | ICD-10-CM | POA: Diagnosis not present

## 2021-07-23 DIAGNOSIS — H30003 Unspecified focal chorioretinal inflammation, bilateral: Secondary | ICD-10-CM | POA: Diagnosis not present

## 2021-07-23 DIAGNOSIS — H35371 Puckering of macula, right eye: Secondary | ICD-10-CM | POA: Diagnosis not present

## 2021-07-23 DIAGNOSIS — Z79899 Other long term (current) drug therapy: Secondary | ICD-10-CM | POA: Diagnosis not present

## 2021-07-23 DIAGNOSIS — H2512 Age-related nuclear cataract, left eye: Secondary | ICD-10-CM | POA: Diagnosis not present

## 2021-07-23 DIAGNOSIS — Z961 Presence of intraocular lens: Secondary | ICD-10-CM | POA: Diagnosis not present

## 2021-07-23 DIAGNOSIS — H21542 Posterior synechiae (iris), left eye: Secondary | ICD-10-CM | POA: Diagnosis not present

## 2021-07-23 DIAGNOSIS — D869 Sarcoidosis, unspecified: Secondary | ICD-10-CM | POA: Diagnosis not present

## 2021-08-09 ENCOUNTER — Ambulatory Visit (HOSPITAL_COMMUNITY): Payer: Medicare HMO | Attending: Internal Medicine

## 2021-08-09 DIAGNOSIS — D869 Sarcoidosis, unspecified: Secondary | ICD-10-CM | POA: Insufficient documentation

## 2021-08-09 DIAGNOSIS — I361 Nonrheumatic tricuspid (valve) insufficiency: Secondary | ICD-10-CM

## 2021-08-09 DIAGNOSIS — I340 Nonrheumatic mitral (valve) insufficiency: Secondary | ICD-10-CM

## 2021-08-09 DIAGNOSIS — R002 Palpitations: Secondary | ICD-10-CM | POA: Diagnosis not present

## 2021-08-09 LAB — ECHOCARDIOGRAM COMPLETE
Area-P 1/2: 3.65 cm2
S' Lateral: 2.6 cm

## 2021-08-13 ENCOUNTER — Other Ambulatory Visit: Payer: Self-pay | Admitting: *Deleted

## 2021-08-13 DIAGNOSIS — J9611 Chronic respiratory failure with hypoxia: Secondary | ICD-10-CM

## 2021-08-31 ENCOUNTER — Encounter: Payer: Self-pay | Admitting: *Deleted

## 2021-09-01 NOTE — Progress Notes (Signed)
Patient ID: KIMM SIDER, female    DOB: 1955/12/21, 66 y.o.   MRN: [id number]  HPI  Female never smoker followed for Sarcoid III involving eyes/iritis, lungs and skin-bronchoscopy 2000. Failed Plaquenil for skin rash. ACE 02/19/15- elevated 97, down from >100 07/2014.  ACE 05/12/16-   50 (9-67) Office Spirometry 08/06/2015-moderate restriction. FVC 1.53/63%, FEV1 1.19/62%, FEV1/FVC 0.78 Room air saturation at rest on arrival 03/14/16- 88%, desaturating to 85% with ambulation and improving to 98% ambulating on 2 L. PFT 03/16/17-moderately severe restriction, severe reduction of diffusion.  FVC 1.62/66%, FEV1 1.46/76%, ratio 0.90, TLC 50%, DLCO 26% Had VATS/ robotic pleurectomy 02/2019 and again 08/23/19-for recurrent ptx realted to fibrocystic sarcoid. 6 MWT 10/01/19- Arrived RA 96%, desat to 87% on room air, corrected to 91% on 2L. -------------------------------------------------------------------------------------------------------  03/04/21- 66 year old female never smoker followed for Sarcoid III involving eyes/iritis, lungs,rectum, skin-bronchoscopy 2000, chronic hypoxic respiratory failure Had VATS/ robotic pleurodesis 02/2019 and again 08/23/19-for recurrent ptx related to fibrocystic sarcoid ACE in 2018 was down to 50 ( 9-67) ACE 04/06/20- 71 (9-67) persistently high x 10 years O2   1.5-2 L sleep / [facility]     -Trelegy 100, MTX, , Proventil HFA,  Covid vax-3 Phizer Flu vax-had She found that sample Breztri was no better than Trelegy and that twice daily use of Ventolin rescue inhaler has been sufficient for her with no significant breakthrough events.  Stable daily cough usually productive scant white.  No adenopathy or rash.  She continues to use oxygen for sleep. CTa chest PE 12/04/20- IMPRESSION: 1. No evidence of pulmonary embolism. 2. No acute findings in the thorax. 3. Chronic imaging stigmata of sarcoidosis, similar to prior studies. 4. Dilatation of the pulmonic trunk (3.6 cm in  diameter), concerning for associated pulmonary arterial hypertension. 5. Status post right talc pleurodesis. 6. Aortic atherosclerosis. Aortic Atherosclerosis (ICD10-I70.0).  09/03/21- 66 year old female never smoker followed for Sarcoid III involving eyes/iritis, lungs,rectum, skin-bronchoscopy 2000, Chronic Hypoxic Respiratory Failure Had VATS/ robotic pleurodesis 02/2019 and again 08/23/19-for recurrent ptx related to fibrocystic sarcoid ACE in 2018 was down to 50 ( 9-67) ACE 04/06/20- 71 (9-67) persistently high x 10 years ACE 03/04/21- 49    last documented elevation 2018 O2   2-3 L L sleep / [facility]    -Trelegy 100,   Proventil HFA,  Methotrexate 7.'5mg'$ /week Covid vax-3 Phizer -----Pt f/u for sarcoidosis, using 2L regularly (will use 3L w/ extra exertion). Does have a productive cough at times, no wheezing. Recent eye exam saw no active inflammation on MTX. Reviewed oxygen status. She will check with [employer] about 3L demand POC. She is consdering a trip to [place], but realizes it may be too much for her.   Review of Systems- see HPI   + = positive Constitutional:   weight loss, night sweats, fevers, chills, fatigue, lassitude. HEENT:   No-  headaches, difficulty swallowing, tooth/dental problems, sore throat,       No-  sneezing, itching, ear ache, nasal congestion, post nasal drip,  CV:  +chest pain, orthopnea, PND, swelling in lower extremities, anasarca,   dizziness, palpitations Resp:+shortness of breath with exertion or at rest.               +productive cough,   non-productive cough,  No-  coughing up of blood.              No-   change in color of mucus.  No- wheezing.   Skin: +  rash or lesions. GI:  No-  heartburn, indigestion, abdominal pain, nausea, vomiting,  GU:  MS:  No-   joint pain or swelling.  No- decreased range of motion.  No- back pain. Neuro- nothing unusual Psych:  No- change in mood or affect. No depression or anxiety.  No memory loss.  Objective:   Physical  Exam General- Alert, Oriented, Affect-appropriate, Distress- none acute, slender Skin- + multiple small linear plaques along flexure lines left side of neck, down onto upper chest, low back,  in the right posterior axillary line and extensor surfaces of forearms and elbows she has stable macular lesions which she says itch and are tender. + Reddened nonspecific dermatitis dorsum right hand. Lymphadenopathy- none Head- atraumatic            Eyes- Gross vision intact, PERRLA, conjunctivae clear secretions            Ears- Hearing, canals normal            Nose- Clear, No-Septal dev, mucus, polyps, erosion, perforation             Throat- Mallampati IV , mucosa clear , drainage- none, tonsils- atrophic Neck- flexible , trachea midline, no stridor , thyroid nl, carotid no bruit Chest - symmetrical excursion , unlabored           Heart/CV- RRR , no murmur , no gallop  , no rub, nl s1,    +P2 prominent                           - JVD +1 , edema- none, stasis changes- none, varices- none           Lung- +minimal crackles, wheeze- none, cough - none , dullness-none, rub- none,                 Chest wall- + 2 cm rubbery somewhat mobile subcutaneous nodule at distal end of left clavicle- probable lipoma Abd- no HSM Br/ Gen/ Rectal- Not done, not indicated Extrem- + cool fingers, cyanosis- none, clubbing, none, atrophy- none, strength- nl, +1 cm rubbery nodule at base R thumb Neuro- grossly intact to observation Assessment & Plan:

## 2021-09-03 ENCOUNTER — Ambulatory Visit (INDEPENDENT_AMBULATORY_CARE_PROVIDER_SITE_OTHER): Payer: Medicare HMO | Admitting: Internal Medicine

## 2021-09-03 ENCOUNTER — Encounter: Payer: Self-pay | Admitting: Internal Medicine

## 2021-09-03 DIAGNOSIS — J9611 Chronic respiratory failure with hypoxia: Secondary | ICD-10-CM

## 2021-09-03 DIAGNOSIS — D869 Sarcoidosis, unspecified: Secondary | ICD-10-CM

## 2021-09-03 NOTE — Patient Instructions (Signed)
Check with [employer] about portable oxygen concentrators able to provide 3L "demand" O2 flow.  Please call if we can help

## 2021-09-09 ENCOUNTER — Telehealth: Payer: Self-pay | Admitting: Internal Medicine

## 2021-09-09 DIAGNOSIS — D869 Sarcoidosis, unspecified: Secondary | ICD-10-CM

## 2021-09-09 NOTE — Telephone Encounter (Signed)
Spk with pt states [facility] no longer excepts  [employer] and needs to change DME company to [place].  Please advise

## 2021-09-09 NOTE — Telephone Encounter (Signed)
Need to order change DME for oxygen from [place] to [facility] due to insurance. Order O2 continuous rest and exertion 2L rest and sleep, 3L exertion. She had resting room air O2 sat 88% on 03/14/16                Walk test on room air 10/01/19 desaturated to 87%, corrected on 2L Dx Chronic Respiratory Failure with Hypoxia, Sarcoid  Thanks

## 2021-09-09 NOTE — Telephone Encounter (Signed)
Patient would like the nurse to contact [employer] regarding her oxygen tank.  She stated she spoke with [employer] and they said the doctor or nurse would have to fax more information regarding the patient.  Please advise.

## 2021-09-09 NOTE — Telephone Encounter (Signed)
Order placed to new DME [facility], pt notified of change  Nothing further

## 2021-09-10 ENCOUNTER — Telehealth: Payer: Self-pay | Admitting: Internal Medicine

## 2021-09-10 NOTE — Telephone Encounter (Signed)
Order was placed yesterday for oxygen to go to [employer]. Not [facility]. We are just waiting for Dr Annamaria Boots to sign order and we can send it out to [facility]. Nothing further needed

## 2021-09-13 ENCOUNTER — Telehealth: Payer: Self-pay | Admitting: Internal Medicine

## 2021-09-13 NOTE — Telephone Encounter (Signed)
Called [employer] and they confirmed they only had part of the order for oxygen. Faxed over the full order for oxygen.   Called patient and updated her on the oxygen order. Nothing further needed

## 2021-09-28 ENCOUNTER — Encounter: Payer: Self-pay | Admitting: Internal Medicine

## 2021-09-28 NOTE — Assessment & Plan Note (Signed)
She remains dependent on supplemental O2.  Plan- she is going to check with [employer] for their options for 3L POC.

## 2021-09-28 NOTE — Assessment & Plan Note (Signed)
Severe residual scarring Plan- follow over time, imaging when needed

## 2021-10-07 DIAGNOSIS — J849 Interstitial pulmonary disease, unspecified: Secondary | ICD-10-CM | POA: Diagnosis not present

## 2021-10-07 DIAGNOSIS — Z23 Encounter for immunization: Secondary | ICD-10-CM | POA: Diagnosis not present

## 2021-10-07 DIAGNOSIS — E559 Vitamin D deficiency, unspecified: Secondary | ICD-10-CM | POA: Diagnosis not present

## 2021-10-07 DIAGNOSIS — Z0001 Encounter for general adult medical examination with abnormal findings: Secondary | ICD-10-CM | POA: Diagnosis not present

## 2021-10-07 DIAGNOSIS — Z0000 Encounter for general adult medical examination without abnormal findings: Secondary | ICD-10-CM | POA: Diagnosis not present

## 2021-10-07 DIAGNOSIS — Z7189 Other specified counseling: Secondary | ICD-10-CM | POA: Diagnosis not present

## 2021-10-07 DIAGNOSIS — D860 Sarcoidosis of lung: Secondary | ICD-10-CM | POA: Diagnosis not present

## 2021-10-07 DIAGNOSIS — I10 Essential (primary) hypertension: Secondary | ICD-10-CM | POA: Diagnosis not present

## 2021-10-07 DIAGNOSIS — Z1159 Encounter for screening for other viral diseases: Secondary | ICD-10-CM | POA: Diagnosis not present

## 2021-10-07 DIAGNOSIS — Z79899 Other long term (current) drug therapy: Secondary | ICD-10-CM | POA: Diagnosis not present

## 2021-11-05 ENCOUNTER — Other Ambulatory Visit (HOSPITAL_COMMUNITY): Payer: Self-pay | Admitting: Nurse Practitioner

## 2021-11-05 DIAGNOSIS — I739 Peripheral vascular disease, unspecified: Secondary | ICD-10-CM

## 2021-11-11 ENCOUNTER — Ambulatory Visit (HOSPITAL_COMMUNITY)
Admission: RE | Admit: 2021-11-11 | Discharge: 2021-11-11 | Disposition: A | Discharge status: Home / Self Care | Payer: Medicare HMO | Source: Ambulatory Visit | Attending: Vascular Surgery | Admitting: Vascular Surgery

## 2021-11-11 DIAGNOSIS — I739 Peripheral vascular disease, unspecified: Secondary | ICD-10-CM | POA: Diagnosis present

## 2022-01-28 ENCOUNTER — Other Ambulatory Visit: Payer: Self-pay | Admitting: Nurse Practitioner

## 2022-01-28 ENCOUNTER — Ambulatory Visit
Admission: RE | Admit: 2022-01-28 | Discharge: 2022-01-28 | Disposition: A | Discharge status: Home / Self Care | Payer: Medicare HMO | Source: Ambulatory Visit | Attending: Nurse Practitioner | Admitting: Nurse Practitioner

## 2022-01-28 DIAGNOSIS — M5489 Other dorsalgia: Secondary | ICD-10-CM

## 2022-02-25 ENCOUNTER — Other Ambulatory Visit: Payer: Self-pay | Admitting: Internal Medicine

## 2022-02-28 ENCOUNTER — Other Ambulatory Visit: Payer: Self-pay | Admitting: Internal Medicine

## 2022-03-07 ENCOUNTER — Ambulatory Visit: Payer: Medicare HMO | Admitting: Internal Medicine

## 2022-03-26 NOTE — Progress Notes (Signed)
Patient ID: Lauren Massey, female    DOB: 31-Jul-1955, 67 y.o.   MRN: [id number]  HPI  Female never smoker followed for Sarcoid III involving eyes/iritis, lungs and skin-bronchoscopy 2000. Failed Plaquenil for skin rash. ACE 02/19/15- elevated 97, down from >100 07/2014.  ACE 05/12/16-   50 (9-67) Office Spirometry 08/06/2015-moderate restriction. FVC 1.53/63%, FEV1 1.19/62%, FEV1/FVC 0.78 Room air saturation at rest on arrival 03/14/16- 88%, desaturating to 85% with ambulation and improving to 98% ambulating on 2 L. PFT 03/16/17-moderately severe restriction, severe reduction of diffusion.  FVC 1.62/66%, FEV1 1.46/76%, ratio 0.90, TLC 50%, DLCO 26% Had VATS/ robotic pleurectomy 02/2019 and again 08/23/19-for recurrent ptx realted to fibrocystic sarcoid. 6 MWT 10/01/19- Arrived RA 96%, desat to 87% on room air, corrected to 91% on 2L. Walk test 03/28/22- quickly desat to 88% room air here. Could not sustain normal on 3L. -------------------------------------------------------------------------------------------------------   09/03/21- 67 year old female never smoker followed for Sarcoid III involving eyes/iritis, lungs,rectum, skin-bronchoscopy 2000, Chronic Hypoxic Respiratory Failure Had VATS/ robotic pleurodesis 02/2019 and again 08/23/19-for recurrent ptx related to fibrocystic sarcoid ACE in 2018 was down to 50 ( 9-67) ACE 04/06/20- 71 (9-67) persistently high x 10 years ACE 03/04/21- 49    last documented elevation 2018 O2   2-3 L L sleep / [employer]    -Trelegy 100,   Proventil HFA,  Methotrexate 7.5mg /week Covid vax-3 Phizer -----Pt f/u for sarcoidosis, using 2L regularly (will use 3L w/ extra exertion). Does have a productive cough at times, no wheezing. Recent eye exam saw no active inflammation on MTX. Reviewed oxygen status. She will check with [employer] about 3L demand POC. She is consdering a trip to [place], but realizes it may be too much for her.  03/28/22- 67 year old female never smoker followed  for Sarcoid III involving eyes/iritis, lungs,rectum, skin-bronchoscopy 2000, Chronic Hypoxic Respiratory Failure Had VATS/ robotic pleurodesis 02/2019 and again 08/23/19-for recurrent ptx related to fibrocystic sarcoid ACE in 2018 was down to 50 ( 9-67) ACE 04/06/20- 71 (9-67) persistently high x 10 years ACE 03/04/21- 49    last documented elevation 2018 O2   2-3 L L sleep / [employer]    -Trelegy 100,   Proventil HFA,  Methotrexate 7.5mg /week Covid vax-3 Phizer Flu vax- -----Pt wants to get poc but states she did not qualify  Walk test 03/28/22- quickly desat to 88% room air here. Could not sustain normal on 3L. She is working as a Charity fundraiser 4 hours a day now.  Recognizes limited exercise tolerance.  Desaturates easily but another walk test today shows that POC cannot maintain oxygen levels during exertion.  She had hoped to be use a portable concentrator that she could take on an airplane for proposed trip to [place].  We will work with [employer] to get her a portable tank but she can't take that on the plane and she is encouraged to make other plans.   Review of Systems- see HPI   + = positive Constitutional:   weight loss, night sweats, fevers, chills, fatigue, lassitude. HEENT:   No-  headaches, difficulty swallowing, tooth/dental problems, sore throat,       No-  sneezing, itching, ear ache, nasal congestion, post nasal drip,  CV:  +chest pain, orthopnea, PND, swelling in lower extremities, anasarca,   dizziness, palpitations Resp:+shortness of breath with exertion or at rest.               +productive cough,   non-productive cough,  No-  coughing up of blood.  No-   change in color of mucus.  No- wheezing.   Skin: +  rash or lesions. GI:  No-   heartburn, indigestion, abdominal pain, nausea, vomiting,  GU:  MS:  No-   joint pain or swelling.  No- decreased range of motion.  No- back pain. Neuro- nothing unusual Psych:  No- change in mood or affect. No depression or anxiety.  No  memory loss.  Objective:   Physical Exam General- Alert, Oriented, Affect-appropriate, Distress- none acute, slender Skin- + multiple small linear plaques along flexure lines left side of neck, down onto upper chest, low back,  in the right posterior axillary line and extensor surfaces of forearms and elbows she has stable macular lesions which she says itch and are tender. + Reddened nonspecific dermatitis dorsum right hand. Lymphadenopathy- none Head- atraumatic            Eyes- Gross vision intact, PERRLA, conjunctivae clear secretions            Ears- Hearing, canals normal            Nose- Clear, No-Septal dev, mucus, polyps, erosion, perforation             Throat- Mallampati IV , mucosa clear , drainage- none, tonsils- atrophic Neck- flexible , trachea midline, no stridor , thyroid nl, carotid no bruit Chest - symmetrical excursion , unlabored           Heart/CV- RRR , no murmur , no gallop  , no rub, nl s1,    +P2 prominent                           - JVD +1 , edema- none, stasis changes- none, varices- none           Lung- +minimal crackles, wheeze- none, cough - none , dullness-none, rub- none,                 Chest wall- + 2 cm rubbery somewhat mobile subcutaneous nodule at distal end of left clavicle- probable lipoma Abd- no HSM Br/ Gen/ Rectal- Not done, not indicated Extrem- + cool fingers, cyanosis- none, clubbing, none, atrophy- none, strength- nl, +1 cm rubbery nodule at base R thumb Neuro- grossly intact to observation Assessment & Plan:

## 2022-03-28 ENCOUNTER — Ambulatory Visit: Payer: Medicare HMO | Admitting: Internal Medicine

## 2022-03-28 ENCOUNTER — Encounter: Payer: Self-pay | Admitting: Internal Medicine

## 2022-03-28 VITALS — BP 110/66 | HR 62 | Ht 62.0 in | Wt 137.6 lb

## 2022-03-28 DIAGNOSIS — J9611 Chronic respiratory failure with hypoxia: Secondary | ICD-10-CM

## 2022-03-28 DIAGNOSIS — D869 Sarcoidosis, unspecified: Secondary | ICD-10-CM

## 2022-03-28 MED ORDER — ALBUTEROL SULFATE HFA 108 (90 BASE) MCG/ACT IN AERS: 2.0000 | INHALATION_SPRAY | Freq: Four times a day (QID) | RESPIRATORY_TRACT | 12 refills | Status: AC | PRN

## 2022-03-28 NOTE — Patient Instructions (Signed)
Order- lab- Angiotensin Converting Enzyme level  dx Sarcoid  Refill sent for albuterol rescue inhaler  Please call if we can help

## 2022-03-31 LAB — ANGIOTENSIN CONVERTING ENZYME: Angiotensin-Converting Enzyme: 32 U/L (ref 9–67)

## 2022-04-04 ENCOUNTER — Other Ambulatory Visit: Payer: Self-pay | Admitting: Internal Medicine

## 2022-04-13 ENCOUNTER — Encounter: Payer: Self-pay | Admitting: Internal Medicine

## 2022-04-13 NOTE — Assessment & Plan Note (Signed)
She qualifies for portable oxygen but unfortunately has not been able to show that a portable concentrator can maintain oxygen saturation. Plan-portable tank oxygen for continuous flow.

## 2022-04-13 NOTE — Assessment & Plan Note (Signed)
I think this is finally burned-out. Plan-recheck ACE level

## 2022-04-24 ENCOUNTER — Emergency Department (HOSPITAL_BASED_OUTPATIENT_CLINIC_OR_DEPARTMENT_OTHER)
Admission: EM | Admit: 2022-04-24 | Discharge: 2022-04-24 | Disposition: A | Discharge status: Home / Self Care | Payer: Medicare HMO | Attending: Emergency Medicine | Admitting: Emergency Medicine

## 2022-04-24 ENCOUNTER — Encounter (HOSPITAL_BASED_OUTPATIENT_CLINIC_OR_DEPARTMENT_OTHER): Payer: Self-pay

## 2022-04-24 DIAGNOSIS — M5416 Radiculopathy, lumbar region: Secondary | ICD-10-CM | POA: Insufficient documentation

## 2022-04-24 DIAGNOSIS — M545 Low back pain, unspecified: Secondary | ICD-10-CM | POA: Diagnosis present

## 2022-04-24 LAB — URINALYSIS, ROUTINE W REFLEX MICROSCOPIC
Bilirubin Urine: NEGATIVE
Glucose, UA: NEGATIVE mg/dL
Hgb urine dipstick: NEGATIVE
Ketones, ur: NEGATIVE mg/dL
Leukocytes,Ua: NEGATIVE
Nitrite: NEGATIVE
Protein, ur: NEGATIVE mg/dL
Specific Gravity, Urine: 1.025 (ref 1.005–1.030)
pH: 6 (ref 5.0–8.0)

## 2022-04-24 MED ORDER — LIDOCAINE 5 % EX PTCH
1.0000 | MEDICATED_PATCH | CUTANEOUS | Status: DC
  Administered 2022-04-24: 1 via TRANSDERMAL
  Filled 2022-04-24: qty 1

## 2022-04-24 MED ORDER — PREDNISONE 20 MG PO TABS
40.0000 mg | ORAL_TABLET | Freq: Once | ORAL | Status: AC
  Administered 2022-04-24: 40 mg via ORAL
  Filled 2022-04-24: qty 2

## 2022-04-24 MED ORDER — METHOCARBAMOL 500 MG PO TABS: 500.0000 mg | ORAL_TABLET | Freq: Three times a day (TID) | ORAL | 0 refills | Status: DC | PRN

## 2022-04-24 MED ORDER — PREDNISONE 10 MG PO TABS: 40.0000 mg | ORAL_TABLET | Freq: Every day | ORAL | 0 refills | Status: AC

## 2022-04-24 MED ORDER — METHOCARBAMOL 500 MG PO TABS
500.0000 mg | ORAL_TABLET | Freq: Once | ORAL | Status: AC
  Administered 2022-04-24: 500 mg via ORAL
  Filled 2022-04-24: qty 1

## 2022-04-24 MED ORDER — LIDOCAINE 5 % EX PTCH: 1.0000 | MEDICATED_PATCH | CUTANEOUS | 0 refills | Status: DC

## 2022-04-24 MED ORDER — ACETAMINOPHEN 325 MG PO TABS
650.0000 mg | ORAL_TABLET | Freq: Once | ORAL | Status: AC
  Administered 2022-04-24: 650 mg via ORAL
  Filled 2022-04-24: qty 2

## 2022-04-24 NOTE — ED Triage Notes (Addendum)
Pt reports left lower abdominal pain radiating to left buttocks and numbness to left leg. Pain and numbness began yesterday. No known injury. Hx of arthritis in back. No N/VD

## 2022-04-24 NOTE — Discharge Instructions (Signed)
You are seen today for back and leg pain.  Your symptoms seem to be due to a pinched nerve.  Follow-up with your primary care doctor and the back specialist.  You can use the prednisone lidocaine patches and muscle relaxers.  He can also take over-the-counter Tylenol as needed for discomfort as directed on the packaging.  Come back if you have fevers, worsening pain, weakness in your leg, loss of control of bowels or bladder other worsening symptoms.

## 2022-04-24 NOTE — ED Notes (Signed)
Medication instruction and education provided

## 2022-04-24 NOTE — ED Provider Notes (Signed)
[facility] Provider Note   CSN: [id number] Arrival date & time: 04/24/22  [name]     History  Chief Complaint  Patient presents with   Abdominal Pain   Numbness    Lauren Massey is a 67 y.o. female.  History of chronic respiratory failure, sarcoidosis.  Presents the ER for low back pain that seems to radiate to the left groin and down the posterior aspect of the left leg into her foot for the past couple days.  Denies any injury or trauma.  No saddle seizure or paresthesia, no bowel or bladder incontinence.  No dysuria or hematuria.  She is never had this in the past.  Denies fevers chills or weight loss.  She denies any abdominal tenderness nausea or vomiting.   Abdominal Pain      Home Medications Prior to Admission medications   Medication Sig Start Date End Date Taking? Authorizing Provider  lidocaine (LIDODERM) 5 % Place 1 patch onto the skin daily. Remove & Discard patch within 12 hours or as directed by MD 04/24/22  Yes Amedeo Gory, Lora Chavers A, PA-C  methocarbamol (ROBAXIN) 500 MG tablet Take 1 tablet (500 mg total) by mouth every 8 (eight) hours as needed for muscle spasms. 04/24/22  Yes Cathryn Gallery A, PA-C  predniSONE (DELTASONE) 10 MG tablet Take 4 tablets (40 mg total) by mouth daily for 4 days. 04/24/22 04/28/22 Yes Airica Schwartzkopf A, PA-C  albuterol (VENTOLIN HFA) 108 (90 Base) MCG/ACT inhaler Inhale 2 puffs into the lungs every 6 (six) hours as needed for wheezing or shortness of breath. 03/28/22   Deneise Lever, MD  folic acid (FOLVITE) 1 MG tablet Take 5 mg by mouth at bedtime.     [provider]  hydrochlorothiazide (MICROZIDE) 12.5 MG capsule TAKE 1 CAPSULE BY MOUTH EVERY DAY.** OVERDUE FOR ANNUAL APPT MUST SEE PROVIDER FOR FUTURE REFILLS** 02/28/22   Hoyt Koch, MD  methotrexate (RHEUMATREX) 2.5 MG tablet Take 7.5 mg by mouth every Wednesday. Caution:Chemotherapy. Protect from light. Takes 3 tabs a week     [provider]  OXYGEN Inhale into the lungs. 1.5 liters At night and during the day when she's active she uses 2-3 liters of oxygen    [provider]  prednisoLONE acetate (PRED FORTE) 1 % ophthalmic suspension Place 1 drop into the right eye in the morning, at noon, and at bedtime. 10/28/20   [provider]      Allergies    Patient has no known allergies.    Review of Systems   Review of Systems  Gastrointestinal:  Positive for abdominal pain.    Physical Exam Updated Vital Signs BP (!) 143/78 (BP Location: Left Arm)   Pulse 67   Temp 98.6 F (37 C) (Oral)   Resp 18   Wt 65.3 kg   SpO2 95%   BMI 26.34 kg/m  Physical Exam Vitals and nursing note reviewed.  Constitutional:      General: She is not in acute distress.    Appearance: She is well-developed.  HENT:     Head: Normocephalic and atraumatic.  Eyes:     Conjunctiva/sclera: Conjunctivae normal.  Cardiovascular:     Rate and Rhythm: Normal rate and regular rhythm.     Heart sounds: No murmur heard. Pulmonary:     Effort: Pulmonary effort is normal. No respiratory distress.     Breath sounds: Normal breath sounds.  Abdominal:     General:  Abdomen is flat. Bowel sounds are normal.     Palpations: Abdomen is soft.     Tenderness: There is no abdominal tenderness. There is no right CVA tenderness or left CVA tenderness.  Musculoskeletal:        General: No swelling.     Cervical back: Normal and neck supple.     Lumbar back: Positive left straight leg raise test.     Comments: Left lumbar tenderness that reproduces exam, no midline lumbar pain or tenderness.  Skin:    General: Skin is warm and dry.     Capillary Refill: Capillary refill takes less than 2 seconds.  Neurological:     General: No focal deficit present.     Mental Status: She is alert.  Psychiatric:        Mood and Affect: Mood normal.     ED Results / Procedures / Treatments   Labs (all labs ordered are listed,  but only abnormal results are displayed) Labs Reviewed  URINALYSIS, ROUTINE W REFLEX MICROSCOPIC    EKG None  Radiology No results found.  Procedures Procedures    Medications Ordered in ED Medications  lidocaine (LIDODERM) 5 % 1 patch (1 patch Transdermal Patch Applied 04/24/22 2102)  predniSONE (DELTASONE) tablet 40 mg (40 mg Oral Given 04/24/22 2101)  methocarbamol (ROBAXIN) tablet 500 mg (500 mg Oral Given 04/24/22 2101)  acetaminophen (TYLENOL) tablet 650 mg (650 mg Oral Given 04/24/22 2101)    ED Course/ Medical Decision Making/ A&P                             Medical Decision Making Differential diagnosis: Muscle strain, radiculopathy, diverticulitis, kidney stone, other Presents the ER complaining of left lower abdominal pain that goes down her posterior aspect of left thigh to her foot also has some pain going down the left lateral thigh and occasionally to the left lower abdomen.  She has no abdominal tenderness on exam.  She states that pain only occasionally goes from her back into her abdomen and feels like it is more on the surface, no colicky symptoms or flank pain to suggest renal pathology.  She has  no urinary symptoms, no saddle anesthesia or paresthesia, no bowel or bladder incontinence.  She is able to ambulate without difficulty.  She has positive straight leg raises totally reproduces her symptoms.    I Feel this is likely due to radiculopathy.  Patient states she did have x-ray recently where she was told she had degenerative changes of her lumbar spine.  Discussed we will treat her symptomatically.  Is agreeable with plan of care.    She has no other red flags for back pain, no history of cancer, no fevers chills weight loss or other symptoms.  Denies any trauma.  No indication for further testing.  Referred to spine specialist and PCP for further evaluation and management.  She was given strict return precautions.  Urinalysis ordered shows no UTI, no  hematuria  Amount and/or Complexity of Data Reviewed Labs: ordered.  Risk OTC drugs. Prescription drug management.           Final Clinical Impression(s) / ED Diagnoses Final diagnoses:  Lumbar radiculopathy    Rx / DC Orders ED Discharge Orders          Ordered    predniSONE (DELTASONE) 10 MG tablet  Daily        04/24/22 2141    lidocaine (LIDODERM)  5 %  Every 24 hours        04/24/22 2141    methocarbamol (ROBAXIN) 500 MG tablet  Every 8 hours PRN        04/24/22 2141              Darci Current 04/24/22 2310    Tegeler, Gwenyth Allegra, MD 04/24/22 2325

## 2022-04-26 ENCOUNTER — Telehealth: Payer: Self-pay

## 2022-04-26 NOTE — Transitions of Care (Post Inpatient/ED Visit) (Signed)
   04/26/2022  Name: Lauren Massey MRN: [id number] DOB: 31-Aug-1955  Today's TOC FU Call Status: Today's TOC FU Call Status:: Successful TOC FU Call Competed TOC FU Call Complete Date: 04/26/22  Transition Care Management Follow-up Telephone Call Date of Discharge: 04/24/22 Discharge Facility: [facility] Type of Discharge: Emergency Department Reason for ED Visit: Other: (radiculopathy of lumbar) Any questions or concerns?: No  Items Reviewed: Did you receive and understand the discharge instructions provided?: Yes Medications obtained and verified?: Yes (Medications Reviewed) Any new allergies since your discharge?: No Dietary orders reviewed?: NA Do you have support at home?: Yes People in Home: spouse  Home Care and Equipment/Supplies: [facility] Ordered?: NA Any new equipment or medical supplies ordered?: NA  Functional Questionnaire: Do you need assistance with bathing/showering or dressing?: No Do you need assistance with meal preparation?: No Do you need assistance with eating?: No Do you have difficulty maintaining continence: No Do you need assistance with getting out of bed/getting out of a chair/moving?: No Do you have difficulty managing or taking your medications?: No  Follow up appointments reviewed: PCP Follow-up appointment confirmed?: [facility] Follow-up appointment confirmed?: Yes Date of Specialist follow-up appointment?: 05/05/22 Follow-Up Specialty Provider:: Neuro Do you need transportation to your follow-up appointment?: No Do you understand care options if your condition(s) worsen?: Yes-patient verbalized understanding    SIGNATURE Juanda Crumble, [facility] Nurse Health Advisor Direct Dial [phone]

## 2022-05-07 IMAGING — DX DG CHEST 1V PORT
1 series · 1 of 1 positions shown · non-contrast
Comparison: 05/02/2019

CLINICAL DATA: Decreased breath sounds on the right following
coughing episodes

EXAM:
PORTABLE CHEST 1 VIEW

[chest ap]
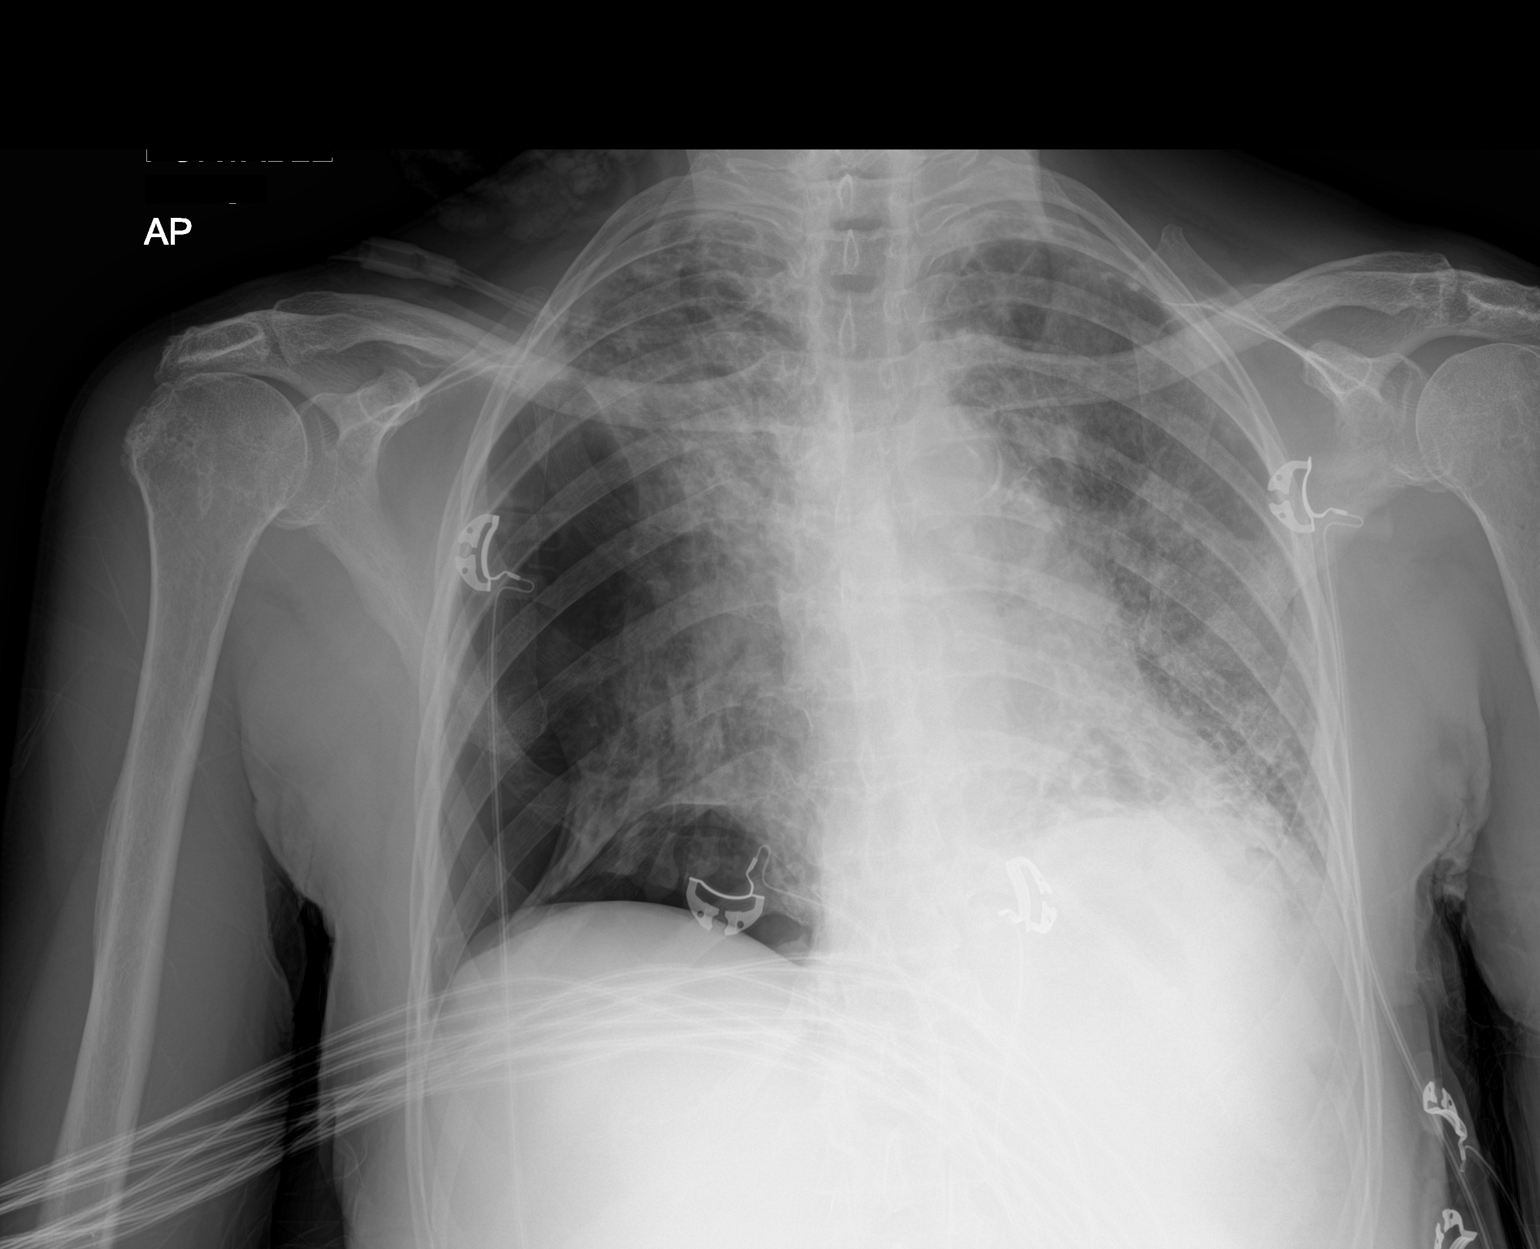

[1 of 1 positions shown; findings below may reference images not displayed]

FINDINGS: Cardiac shadow is stable. Aortic calcifications are again noted.
Scattered fibrotic changes are noted in both lungs consistent with
the patient's given clinical history of underlying sarcoidosis.
There is a new right-sided pneumothorax centered predominately
laterally and inferiorly. No bony abnormality is noted.
IMPRESSION: Right-sided pneumothorax as described.

Critical Value/emergent results were called by telephone at the time
of interpretation on 07/16/2019 at [DATE] to Dr. BENRABAH ETOIL ,
who verbally acknowledged these results.

## 2022-05-07 IMAGING — DX DG CHEST 1V
1 series · 1 of 1 positions shown · non-contrast
Comparison: 07/16/2019, 05/02/2019, CT 03/07/2019

CLINICAL DATA: Post chest tube placement

EXAM:
CHEST  1 VIEW

[chest ap]
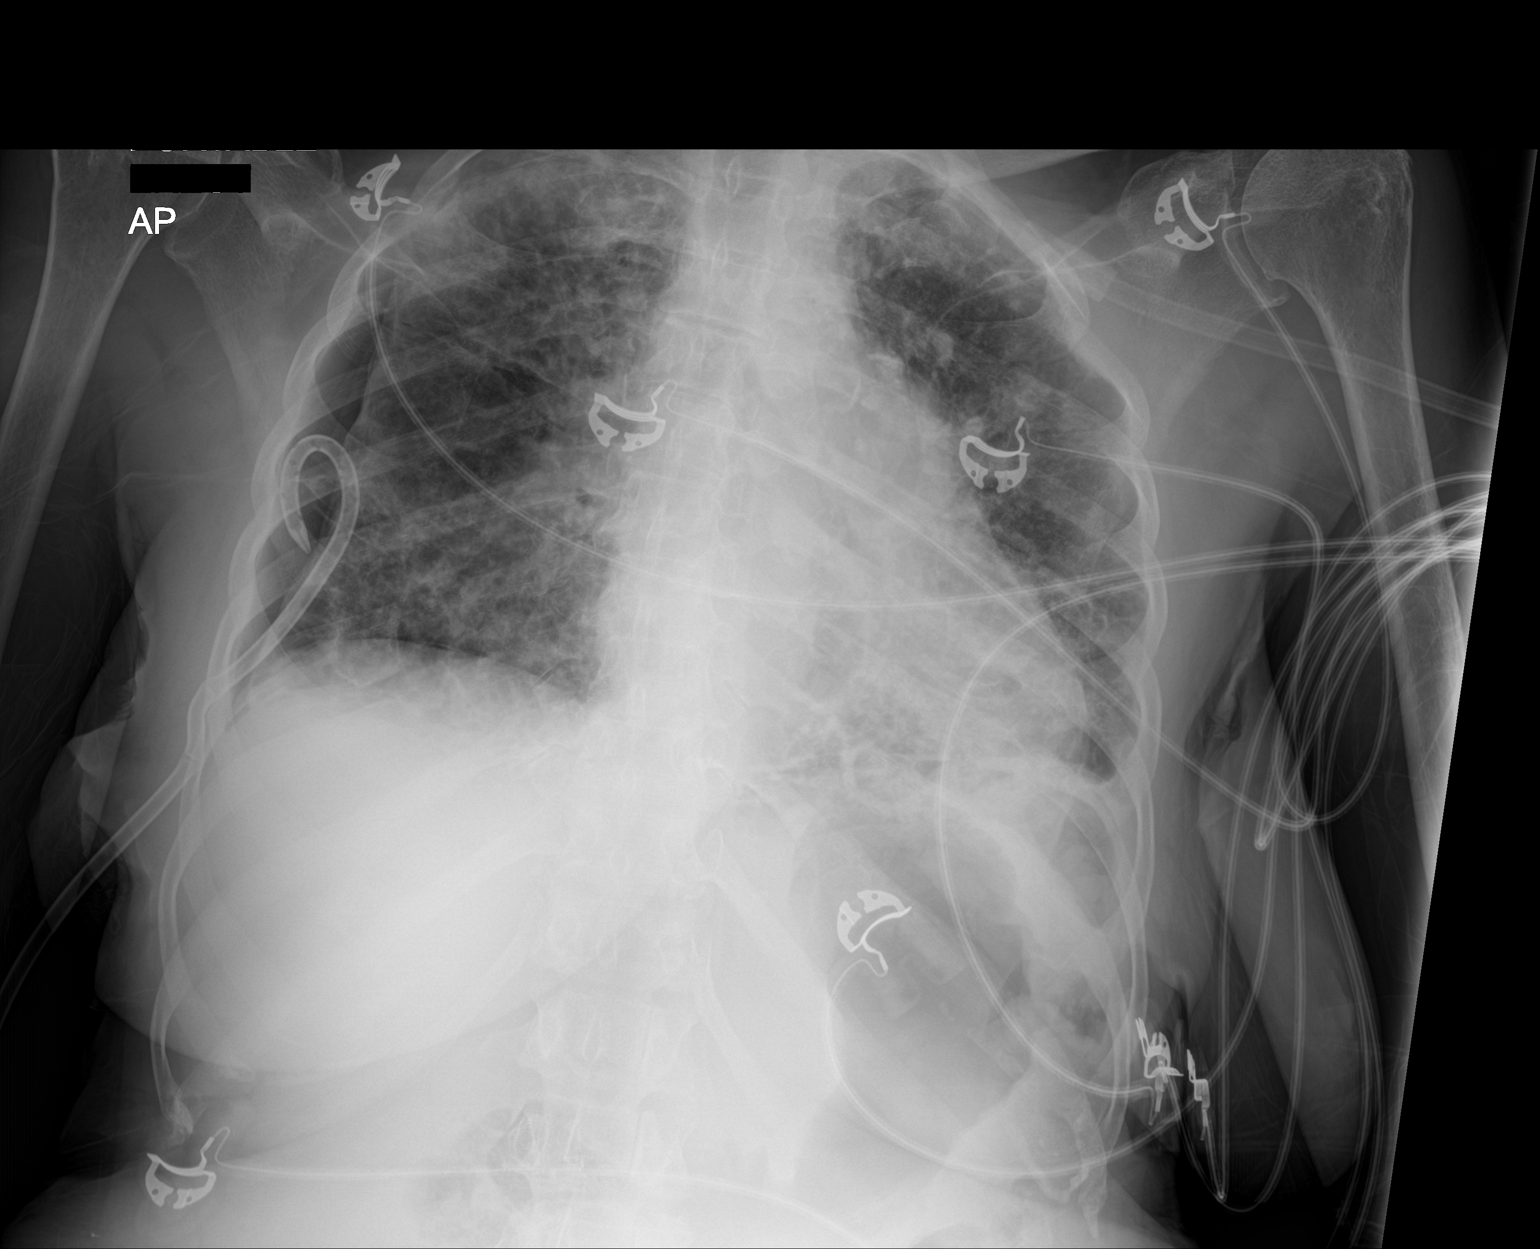

[1 of 1 positions shown; findings below may reference images not displayed]

FINDINGS: Interval insertion of right-sided chest tube with pigtail projecting
over the right lower lateral chest. Decreased size of right
pneumothorax with residual right lateral and basilar pneumothorax,
pleural-parenchymal separation of 1.4 cm laterally. Chronic lung
disease with bronchiectasis and fibrosis. Possible acute atelectasis
or mild infiltrates at the lung bases. Stable cardiomediastinal
silhouette.
IMPRESSION: Interval insertion of right-sided chest tube with decreased size of
right pneumothorax. Residual right lateral and basilar pneumothorax
as above. Chronic lung disease. Possible superimposed atelectasis or
mild infiltrates at the bases.

## 2022-05-08 IMAGING — DX DG CHEST 1V PORT
1 series · 1 of 1 positions shown · non-contrast
Comparison: 07/16/2019

CLINICAL DATA: Follow-up pneumothorax

EXAM:
PORTABLE CHEST 1 VIEW

[chest ap]
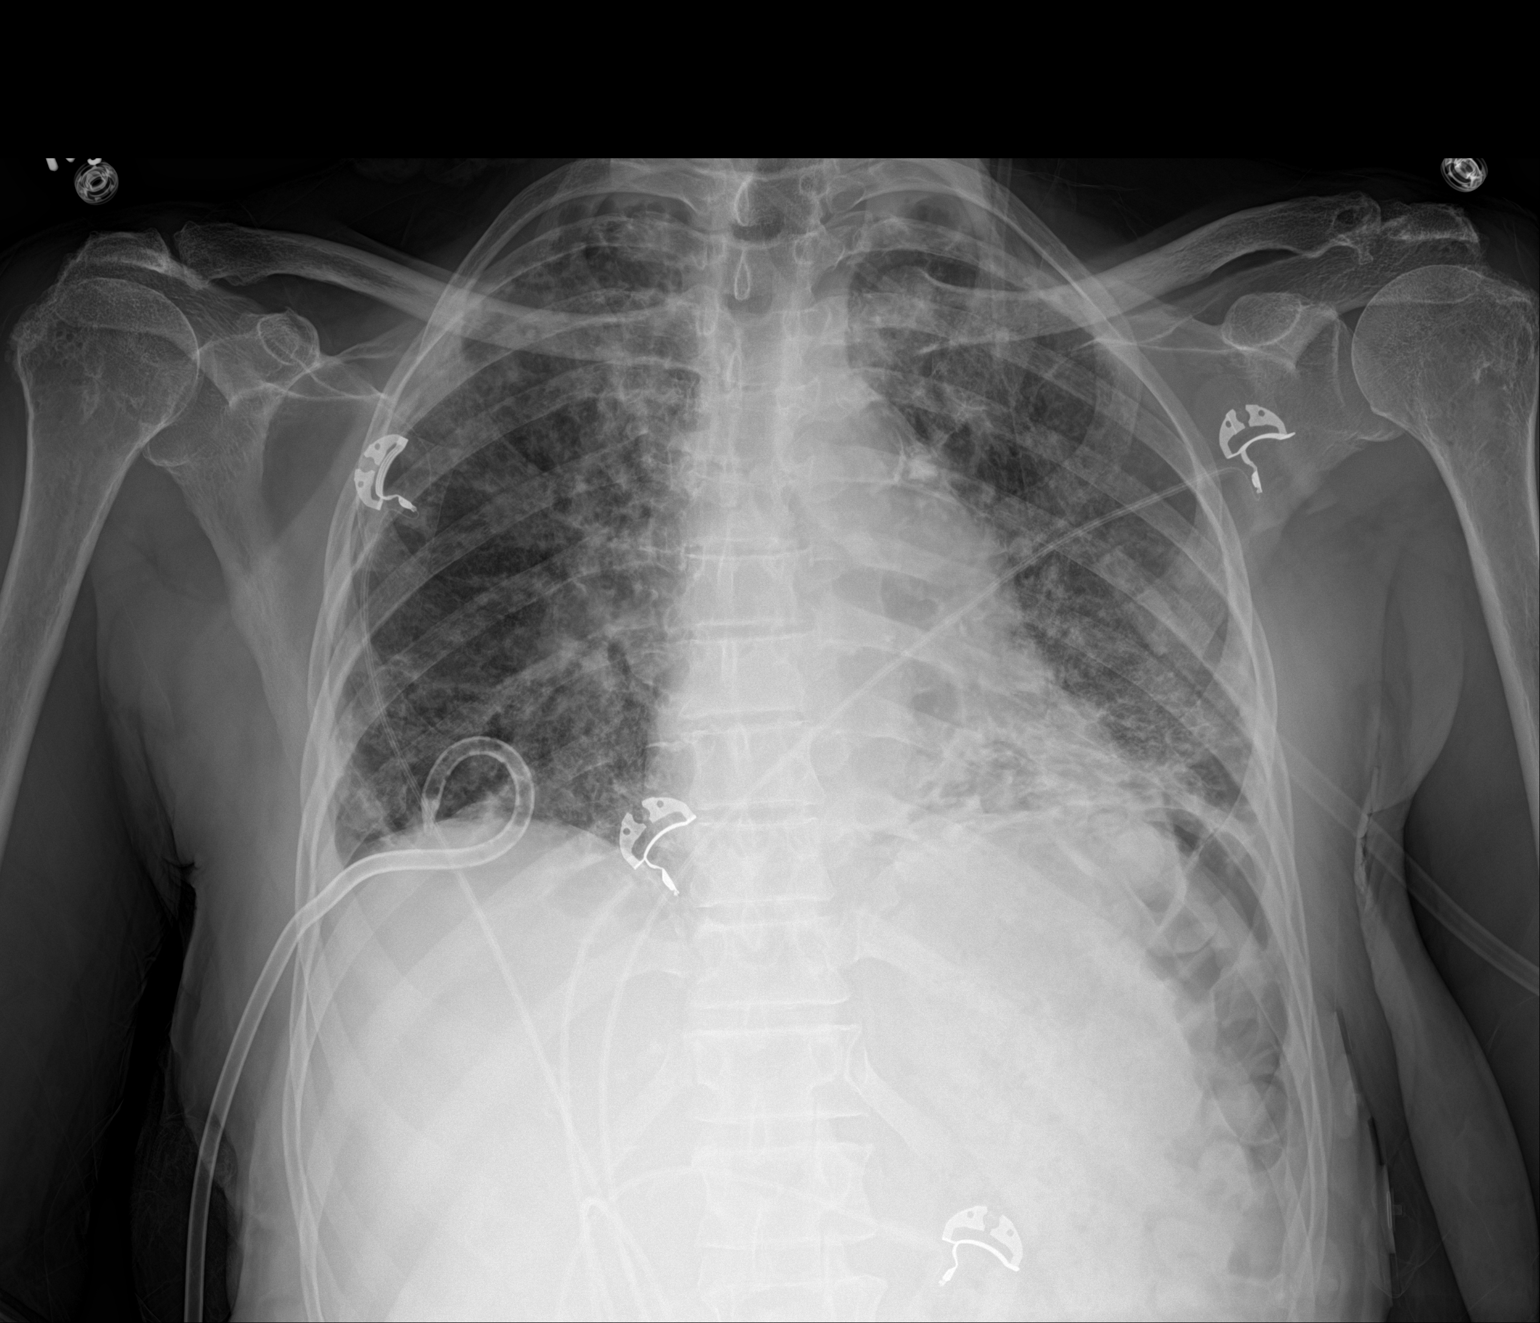

[1 of 1 positions shown; findings below may reference images not displayed]

FINDINGS: Cardiac shadow is stable. Aortic calcifications are again seen.
Chronic fibrotic changes are noted bilaterally and stable. Pigtail
catheter is again noted on the right. The previously seen right
pneumothorax has resolved in the interval.
IMPRESSION: Resolution of right-sided pneumothorax. The remainder of the exam is
stable.

## 2022-05-09 IMAGING — DX DG CHEST 1V PORT
1 series · 1 of 1 positions shown · non-contrast
Comparison: 07/17/2019

CLINICAL DATA: Follow-up chest tube

EXAM:
PORTABLE CHEST 1 VIEW

[chest ap]
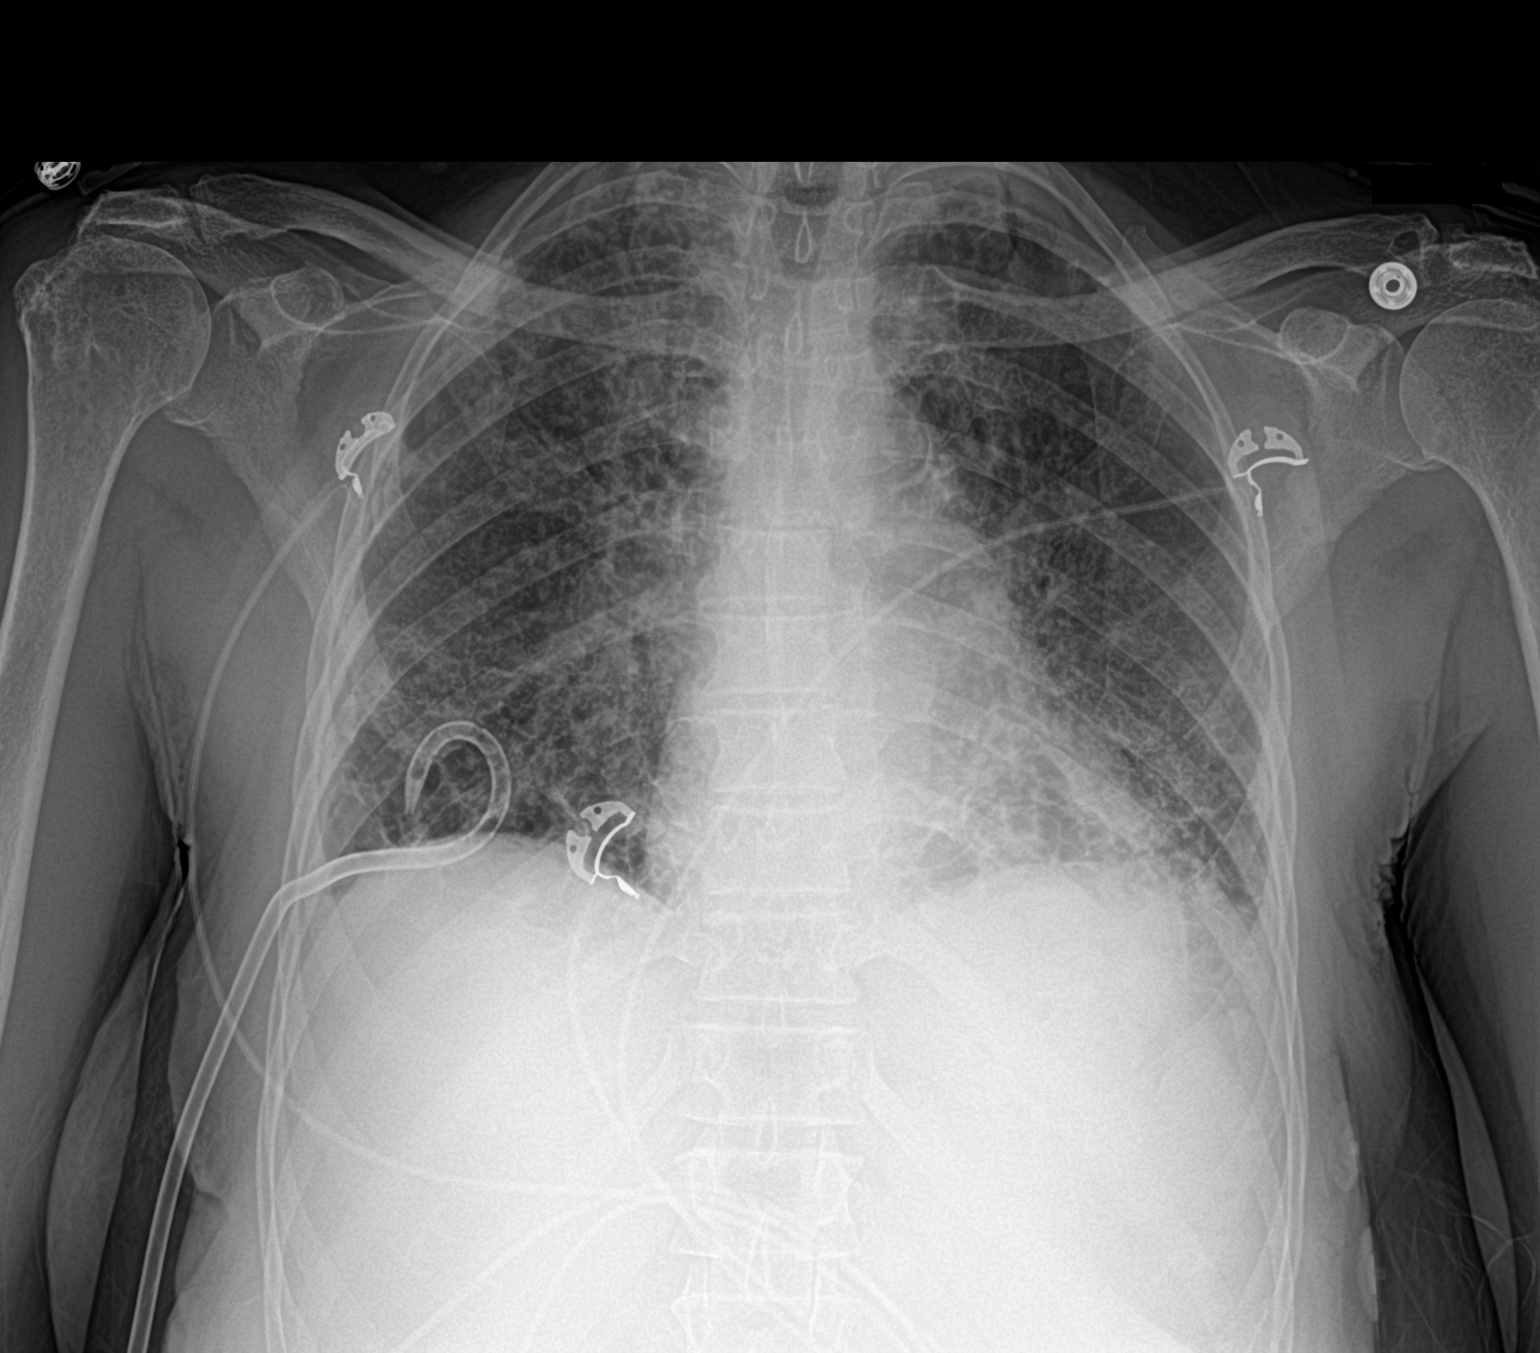

[1 of 1 positions shown; findings below may reference images not displayed]

FINDINGS: Right pigtail catheter is again identified and stable. No definitive
pneumothorax is seen. Stable fibrotic changes are again identified.
Some improved aeration in the left base is seen. No new focal
abnormality is noted.
IMPRESSION: Slight improved aeration in the left base.

No pneumothorax is identified. Chest tube is noted in satisfactory
position.

## 2022-05-10 IMAGING — DX DG CHEST 1V PORT
1 series · 1 of 1 positions shown · non-contrast
Comparison: Radiograph 07/18/2019

CLINICAL DATA: Pneumothorax, history of sarcoidosis.

EXAM:
PORTABLE CHEST 1 VIEW

[chest]
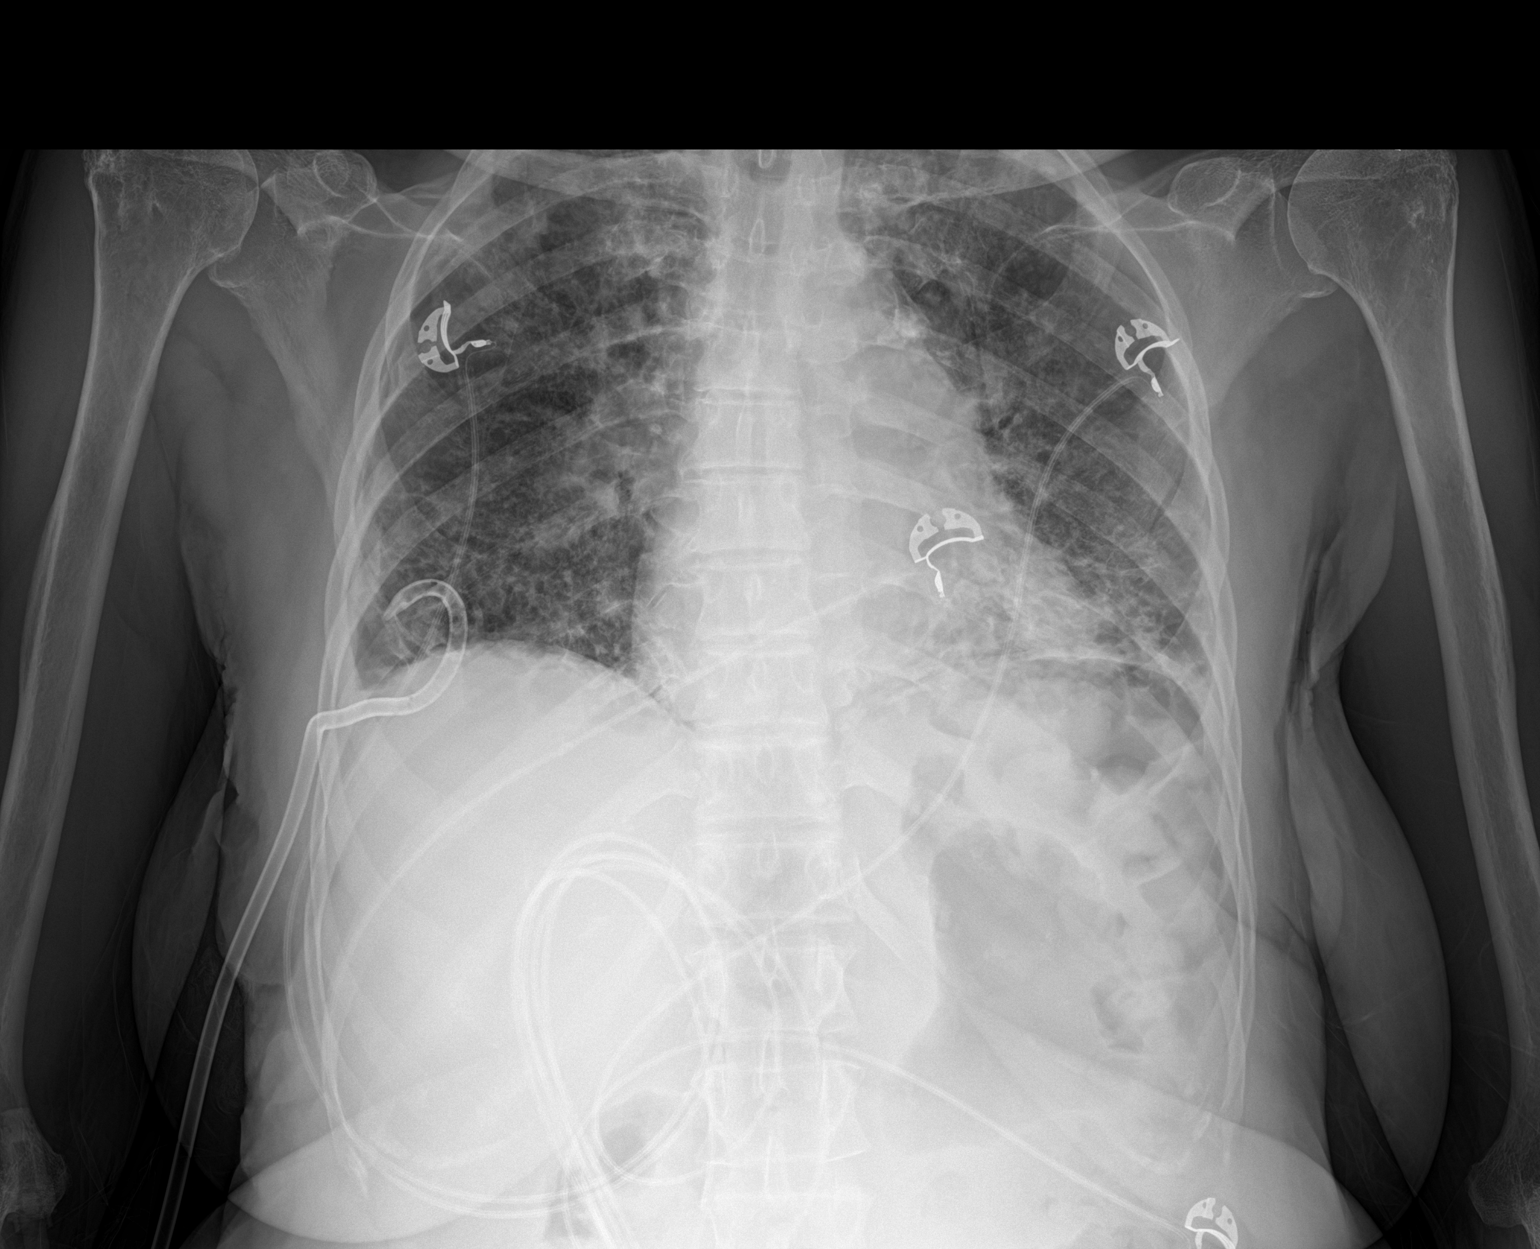

[1 of 1 positions shown; findings below may reference images not displayed]

FINDINGS: Pigtail pleural drain remains in the periphery of the right lung
base.

Telemetry leads overlie the chest.

No discernible right pneumothorax. Some mild residual right pleural
thickening is noted. Stable appearance of the diffuse fibrotic
changes throughout both with more basilar cystic changes and
biapical pleuroparenchymal scarring. No new consolidative opacity is
seen. Stable cardiomediastinal contours. Dense calcified hilar
adenopathy. No acute osseous or soft tissue abnormality.
IMPRESSION: 1. No discernible right pneumothorax. Right pleural drain remains in
place.
2. Otherwise stable appearance of the chest.

## 2022-06-10 ENCOUNTER — Other Ambulatory Visit: Payer: Self-pay | Admitting: Nurse Practitioner

## 2022-06-10 DIAGNOSIS — E2839 Other primary ovarian failure: Secondary | ICD-10-CM

## 2022-09-27 NOTE — Progress Notes (Unsigned)
Patient ID: Lauren Massey, female    DOB: [birth year], 67 y.o.   MRN: [id number]  HPI  Female never smoker followed for Sarcoid III involving eyes/iritis, lungs and skin-bronchoscopy 2000. Failed Plaquenil for skin rash. ACE 02/19/15- elevated 97, down from >100 07/2014.  ACE 05/12/16-   50 (9-67) Office Spirometry 08/06/2015-moderate restriction. FVC 1.53/63%, FEV1 1.19/62%, FEV1/FVC 0.78 Room air saturation at rest on arrival 03/14/16- 88%, desaturating to 85% with ambulation and improving to 98% ambulating on 2 L. PFT 03/16/17-moderately severe restriction, severe reduction of diffusion.  FVC 1.62/66%, FEV1 1.46/76%, ratio 0.90, TLC 50%, DLCO 26% Had VATS/ robotic pleurectomy 02/2019 and again 08/23/19-for recurrent ptx realted to fibrocystic sarcoid. 6 MWT 10/01/19- Arrived RA 96%, desat to 87% on room air, corrected to 91% on 2L. Walk test 03/28/22- quickly desat to 88% room air here. Could not sustain normal on 3L. -------------------------------------------------------------------------------------------------------  03/28/22- 67 year old female never smoker followed for Sarcoid III involving eyes/iritis, lungs,rectum, skin-bronchoscopy 2000, Chronic Hypoxic Respiratory Failure Had VATS/ robotic pleurodesis 02/2019 and again 08/23/19-for recurrent ptx related to fibrocystic sarcoid ACE in 2018 was down to 50 ( 9-67) ACE 04/06/20- 71 (9-67) persistently high x 10 years ACE 03/04/21- 49    last documented elevation 2018 O2   2-3 L L sleep / [employer]    -Trelegy 100,   Proventil HFA,  Methotrexate 7.5mg /week Covid vax-3 Phizer Flu vax- -----Pt wants to get poc but states she did not qualify  Walk test 03/28/22- quickly desat to 88% room air here. Could not sustain normal on 3L. She is working as a Engineer, site 4 hours a day now.  Recognizes limited exercise tolerance.  Desaturates easily but another walk test today shows that POC cannot maintain oxygen levels during exertion.  She had hoped to be use a  portable concentrator that she could take on an airplane for proposed trip to [place].  We will work with [employer] to get her a portable tank but she can't take that on the plane and she is encouraged to make other plans.  09/29/22-  67 year old female never smoker followed for Sarcoid III involving eyes/iritis, lungs,rectum, skin-bronchoscopy 2000, Chronic Hypoxic Respiratory Failure Had VATS/ robotic pleurodesis 02/2019 and again 08/23/19-for recurrent ptx related to fibrocystic sarcoid ACE in 2018 was down to 50 ( 9-67) ACE 04/06/20- 71 (9-67) persistently high x 10 years ACE 03/04/21- 49    last documented elevation 2018 O2   2-3 L L sleep / [employer]   2L pulse -Trelegy 100,   Proventil HFA,  Methotrexate 7.5mg /week Lab-03/28/22- ACE level 32. CBC, liver, renal all ok She seems to feel stable and comfortable with her present circumstances.  She realizes she is dependent on oxygen.  Comes today using a portable tank with demand regulator. At times if she leans over she develops pain across her mid abdomen.  On exam abdomen is soft without palpable enlargement of liver or spleen.  I think she is describing pinching of the abdominal fat between the rib cage and pelvic brim. Occasional dry cough.  Eye doctor gives her methotrexate and folate. She has changed her primary care to [facility]  Review of Systems- see HPI   + = positive Constitutional:   weight loss, night sweats, fevers, chills, fatigue, lassitude. HEENT:   No-  headaches, difficulty swallowing, tooth/dental problems, sore throat,       No-  sneezing, itching, ear ache, nasal congestion, post nasal drip,  CV:  +chest pain, orthopnea, PND, swelling in lower  extremities, anasarca,   dizziness, palpitations Resp:+shortness of breath with exertion or at rest.               +productive cough,   non-productive cough,  No-  coughing up of blood.              No-   change in color of mucus.  No- wheezing.   Skin: +  rash or lesions. GI:  No-    heartburn, indigestion, abdominal pain, nausea, vomiting,  GU:  MS:  No-   joint pain or swelling.  No- decreased range of motion.  No- back pain. Neuro- nothing unusual Psych:  No- change in mood or affect. No depression or anxiety.  No memory loss.  Objective:   Physical Exam General- Alert, Oriented, Affect-appropriate, Distress- none acute, slender Skin- + multiple small linear plaques along flexure lines left side of neck, down onto upper chest, low back,  in the right posterior axillary line and extensor surfaces of forearms and elbows she has stable macular lesions which she says itch and are tender. + Reddened nonspecific dermatitis dorsum right hand. Lymphadenopathy- none Head- atraumatic            Eyes- Gross vision intact, PERRLA, conjunctivae clear secretions            Ears- Hearing, canals normal            Nose- Clear, No-Septal dev, mucus, polyps, erosion, perforation             Throat- Mallampati IV , mucosa clear , drainage- none, tonsils- atrophic Neck- flexible , trachea midline, no stridor , thyroid nl, carotid no bruit Chest - symmetrical excursion , unlabored           Heart/CV- RRR , no murmur , no gallop  , no rub, nl s1,    +P2 prominent                           - JVD +1 , edema- none, stasis changes- none, varices- none           Lung- +minimal crackles, wheeze- none, cough - none , dullness-none, rub- none,                 Chest wall- + 2 cm rubbery somewhat mobile subcutaneous nodule at distal end of left clavicle- probable lipoma Abd- no HSM Br/ Gen/ Rectal- Not done, not indicated Extrem- + cool fingers, cyanosis- none, clubbing, none, atrophy- none, strength- nl, +1 cm rubbery nodule at base R thumb Neuro- grossly intact to observation Assessment & Plan:

## 2022-09-29 ENCOUNTER — Ambulatory Visit: Payer: Medicare HMO | Admitting: Internal Medicine

## 2022-09-29 ENCOUNTER — Encounter: Payer: Self-pay | Admitting: Internal Medicine

## 2022-09-29 ENCOUNTER — Ambulatory Visit (INDEPENDENT_AMBULATORY_CARE_PROVIDER_SITE_OTHER): Payer: Medicare HMO

## 2022-09-29 VITALS — BP 124/62 | HR 74 | Ht 62.0 in | Wt 138.6 lb

## 2022-09-29 DIAGNOSIS — D869 Sarcoidosis, unspecified: Secondary | ICD-10-CM | POA: Diagnosis not present

## 2022-09-29 DIAGNOSIS — J9611 Chronic respiratory failure with hypoxia: Secondary | ICD-10-CM

## 2022-09-29 NOTE — Patient Instructions (Signed)
Continue using your oxygen  Order- CXR    dx Sarcoid  Please call if we can help

## 2022-09-29 NOTE — Assessment & Plan Note (Signed)
She remains dependent on oxygen.  We were not able to qualify her for portable oxygen concentrator but she gets around comfortably with a small tank and demand regulator. Plan-continue to aim for 2 L/min.

## 2022-09-29 NOTE — Assessment & Plan Note (Signed)
ACE level has been down in the normal range when checked most recently.  Hopefully her systemic sarcoid is mostly burned-out now. Plan-CXR

## 2022-12-09 ENCOUNTER — Ambulatory Visit
Admission: RE | Admit: 2022-12-09 | Discharge: 2022-12-09 | Disposition: A | Discharge status: Home / Self Care | Payer: Medicare HMO | Source: Ambulatory Visit | Attending: Nurse Practitioner

## 2022-12-09 DIAGNOSIS — E2839 Other primary ovarian failure: Secondary | ICD-10-CM

## 2023-03-29 NOTE — Progress Notes (Signed)
 Patient ID: Lauren Massey, female    DOB: 28-Aug-1955, 68 y.o.   MRN: [id number]  HPI  Female never smoker followed for Sarcoid III involving eyes/iritis, lungs and skin-bronchoscopy 2000. Failed Plaquenil for skin rash. ACE 02/19/15- elevated 97, down from >100 07/2014.  ACE 05/12/16-   50 (9-67) Office Spirometry 08/06/2015-moderate restriction. FVC 1.53/63%, FEV1 1.19/62%, FEV1/FVC 0.78 Room air saturation at rest on arrival 03/14/16- 88%, desaturating to 85% with ambulation and improving to 98% ambulating on 2 L. PFT 03/16/17-moderately severe restriction, severe reduction of diffusion.  FVC 1.62/66%, FEV1 1.46/76%, ratio 0.90, TLC 50%, DLCO 26% Had VATS/ robotic pleurectomy 02/2019 and again 08/23/19-for recurrent ptx realted to fibrocystic sarcoid. 6 MWT 10/01/19- Arrived RA 96%, desat to 87% on room air, corrected to 91% on 2L. Walk test 03/28/22- quickly desat to 88% room air here. Could not sustain normal on 3L. -------------------------------------------------------------------------------------------------------   09/29/22-  68 year old female never smoker followed for Sarcoid III involving eyes/iritis, lungs,rectum, skin-bronchoscopy 2000, Chronic Hypoxic Respiratory Failure Had VATS/ robotic pleurodesis 02/2019 and again 08/23/19-for recurrent ptx related to fibrocystic sarcoid ACE in 2018 was down to 50 ( 9-67) ACE 04/06/20- 71 (9-67) persistently high x 10 years ACE 03/04/21- 49    last documented elevation 2018 O2   2-3 L L sleep / [employer]   2L pulse -Trelegy 100,   Proventil HFA,  Methotrexate 7.5mg /week Lab-03/28/22- ACE level 32. CBC, liver, renal all ok She seems to feel stable and comfortable with her present circumstances.  She realizes she is dependent on oxygen.  Comes today using a portable tank with demand regulator. At times if she leans over she develops pain across her mid abdomen.  On exam abdomen is soft without palpable enlargement of liver or spleen.  I think she is describing  pinching of the abdominal fat between the rib cage and pelvic brim. Occasional dry cough.  Eye doctor gives her methotrexate and folate. She has changed her primary care to [facility].  03/30/23-  68 year old female never smoker followed for Sarcoid III involving eyes/iritis, lungs,rectum, skin-bronchoscopy 2000, Chronic Hypoxic Respiratory Failure Had VATS/ robotic pleurodesis 02/2019 and again 08/23/19-for recurrent ptx related to fibrocystic sarcoid ACE in 2018 was down to 50 ( 9-67) ACE 04/06/20- 71 (9-67) persistently high x 10 years ACE 03/04/21- 49    last documented elevation 2018 03/28/22- ACE level 32. CBC, liver, renal all ok O2   2-3 L L sleep / [employer]   2L pulse -Trelegy 100,   Proventil HFA,  Methotrexate 7.5mg /week CXR 09/29/22- IMPRESSION: Chronic lung changes compatible with history of sarcoidosis. No significant change from the previous examination. Discussed the use of AI scribe software for clinical note transcription with the patient, who gave verbal consent to proceed. History of Present Illness   The patient, with a history of chronic lung disease secondary to sarcoid, with chronic hypoxic respiratory failure requiring oxygen therapy, presents with concerns about upcoming travel plans. She reports that her breathing is 'okay' but notes that during periods of illness, such as a cold, she requires her oxygen for longer periods. She denies current symptoms of a cold, but notes a slightly sore throat. She also reports some swelling in her left foot after a recent twist, but denies similar symptoms in the right foot. We discussed importance of elevation and mobilization for DVT prophy. She continues O2 1.5L for sleep and 2-3 L with activity.  The patient also mentions a jury summons and requests assistance with a letter to  excuse her due to her oxygen requirement. She denies any other concerns or needs for prescription refills at this time.     Review of Systems- see HPI   + =  positive Constitutional:   weight loss, night sweats, fevers, chills, fatigue, lassitude. HEENT:   No-  headaches, difficulty swallowing, tooth/dental problems, sore throat,       No-  sneezing, itching, ear ache, nasal congestion, post nasal drip,  CV:  +chest pain, orthopnea, PND, swelling in lower extremities, anasarca,   dizziness, palpitations Resp:+shortness of breath with exertion or at rest.               +productive cough,   non-productive cough,  No-  coughing up of blood.              No-   change in color of mucus.  No- wheezing.   Skin: +  rash or lesions. GI:  No-   heartburn, indigestion, abdominal pain, nausea, vomiting,  GU:  MS:  No-   joint pain or swelling.  No- decreased range of motion.  No- back pain. Neuro- nothing unusual Psych:  No- change in mood or affect. No depression or anxiety.  No memory loss.  Objective:   Physical Exam General- Alert, Oriented, Affect-appropriate, Distress- none acute, slender Skin- + multiple small linear plaques along flexure lines left side of neck, down onto upper chest, low back,  in the right posterior axillary line and extensor surfaces of forearms and elbows she has stable macular lesions which she says itch and are tender. Lymphadenopathy- none Head- atraumatic            Eyes- Gross vision intact, PERRLA, conjunctivae clear secretions            Ears- Hearing, canals normal            Nose- Clear, No-Septal dev, mucus, polyps, erosion, perforation             Throat- Mallampati IV , mucosa clear , drainage- none, tonsils- atrophic Neck- flexible , trachea midline, no stridor , thyroid nl, carotid no bruit Chest - symmetrical excursion , unlabored           Heart/CV- RRR , no murmur , no gallop  , no rub, nl s1,    +P2 prominent                           - JVD +1 , edema- none, stasis changes- none, varices- none           Lung- +minimal crackles, wheeze- none, cough - none , dullness-none, rub- none,                 Chest  wall- + 2 cm rubbery somewhat mobile subcutaneous nodule at distal end of left clavicle- probable lipoma Abd- no HSM Br/ Gen/ Rectal- Not done, not indicated Extrem- +trace edema L ankle Neuro- grossly intact to observation Assessment & Plan:  Assessment and Plan:    Chronic Respiratory Failure with Hypoxia Chronic respiratory insufficiency requiring supplemental oxygen. Increased oxygen needs during respiratory infections.  Hx severe sarcoid. - Continue current oxygen therapy regimen. - Monitor oxygen saturation levels, especially during respiratory infections. - Provide a letter for jury duty exemption due to oxygen dependency.  Left Ankle Swelling Swelling in the left ankle due to recent twist. - Advise elevation of the left leg to reduce swelling. - Encourage movement of the ankle to prevent  blood clots.  Methotrexate Use Currently taking methotrexate. - Continue methotrexate as prescribed.

## 2023-03-30 ENCOUNTER — Ambulatory Visit: Payer: Medicare HMO | Admitting: Internal Medicine

## 2023-03-30 ENCOUNTER — Encounter: Payer: Self-pay | Admitting: Internal Medicine

## 2023-03-30 VITALS — BP 140/82 | HR 76 | Temp 97.9°F | Ht 62.0 in | Wt 147.0 lb

## 2023-03-30 DIAGNOSIS — J9611 Chronic respiratory failure with hypoxia: Secondary | ICD-10-CM

## 2023-03-30 DIAGNOSIS — R600 Localized edema: Secondary | ICD-10-CM | POA: Diagnosis not present

## 2023-03-30 NOTE — Patient Instructions (Signed)
 We can continue current meds for breathing  Continue oxygen 1.5L for sleep and 2-3 L when up during the day  If you will bring the jury summons letter, we can provide a jury duty letter for you.  Please call if we can help

## 2023-04-06 ENCOUNTER — Encounter: Payer: Self-pay | Admitting: Internal Medicine

## 2023-05-13 ENCOUNTER — Other Ambulatory Visit: Payer: Self-pay

## 2023-05-13 ENCOUNTER — Encounter (HOSPITAL_COMMUNITY): Payer: Self-pay

## 2023-05-13 ENCOUNTER — Emergency Department (HOSPITAL_COMMUNITY)

## 2023-05-13 ENCOUNTER — Emergency Department (HOSPITAL_COMMUNITY)
Admission: EM | Admit: 2023-05-13 | Discharge: 2023-05-13 | Disposition: A | Discharge status: Home / Self Care | Attending: Emergency Medicine | Admitting: Emergency Medicine

## 2023-05-13 DIAGNOSIS — D869 Sarcoidosis, unspecified: Secondary | ICD-10-CM | POA: Diagnosis not present

## 2023-05-13 DIAGNOSIS — K8020 Calculus of gallbladder without cholecystitis without obstruction: Secondary | ICD-10-CM | POA: Insufficient documentation

## 2023-05-13 DIAGNOSIS — Y92410 Unspecified street and highway as the place of occurrence of the external cause: Secondary | ICD-10-CM | POA: Diagnosis not present

## 2023-05-13 DIAGNOSIS — M546 Pain in thoracic spine: Secondary | ICD-10-CM | POA: Diagnosis not present

## 2023-05-13 DIAGNOSIS — I700 Atherosclerosis of aorta: Secondary | ICD-10-CM | POA: Diagnosis not present

## 2023-05-13 DIAGNOSIS — M542 Cervicalgia: Secondary | ICD-10-CM | POA: Insufficient documentation

## 2023-05-13 DIAGNOSIS — S0990XA Unspecified injury of head, initial encounter: Secondary | ICD-10-CM | POA: Diagnosis not present

## 2023-05-13 LAB — I-STAT CHEM 8, ED
BUN: 19 mg/dL (ref 8–23)
Calcium, Ion: 1.13 mmol/L — ABNORMAL LOW (ref 1.15–1.40)
Chloride: 103 mmol/L (ref 98–111)
Creatinine, Ser: 0.8 mg/dL (ref 0.44–1.00)
Glucose, Bld: 79 mg/dL (ref 70–99)
HCT: 43 % (ref 36.0–46.0)
Hemoglobin: 14.6 g/dL (ref 12.0–15.0)
Potassium: 3.9 mmol/L (ref 3.5–5.1)
Sodium: 138 mmol/L (ref 135–145)
TCO2: 28 mmol/L (ref 22–32)

## 2023-05-13 MED ORDER — CYCLOBENZAPRINE HCL 10 MG PO TABS: 5.0000 mg | ORAL_TABLET | Freq: Two times a day (BID) | ORAL | 0 refills | Status: AC | PRN

## 2023-05-13 MED ORDER — TRAMADOL HCL 50 MG PO TABS: 50.0000 mg | ORAL_TABLET | Freq: Four times a day (QID) | ORAL | 0 refills | Status: AC | PRN

## 2023-05-13 MED ORDER — IOHEXOL 350 MG/ML SOLN
75.0000 mL | Freq: Once | INTRAVENOUS | Status: AC | PRN
  Administered 2023-05-13: 75 mL via INTRAVENOUS
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
## 2023-05-13 NOTE — Discharge Instructions (Signed)
 Return to the emergency department immediately if you develop any of the following symptoms: You have numbness, tingling, or weakness in the arms or legs. You develop severe headaches not relieved with medicine. You have severe neck pain, especially tenderness in the middle of the back of your neck. You have changes in bowel or bladder control. There is increasing pain in any area of the body. You have shortness of breath, light-headedness, dizziness, or fainting. You have chest pain. You feel sick to your stomach (nauseous), throw up (vomit), or sweat. You have increasing abdominal discomfort. There is blood in your urine, stool, or vomit. You have pain in your shoulder (shoulder strap areas). You feel your symptoms are getting worse.

## 2023-05-13 NOTE — ED Notes (Signed)
 Patient transported to CT

## 2023-05-16 ENCOUNTER — Telehealth: Payer: Self-pay

## 2023-05-16 NOTE — Transitions of Care (Post Inpatient/ED Visit) (Signed)
   05/16/2023  Name: Britt C Lecuyer MRN: [id number] DOB: 1955-06-06  Today's TOC FU Call Status:    Attempted to reach the patient regarding the most recent Inpatient/ED visit.  Follow Up Plan: Additional outreach attempts will be made to reach the patient to complete the Transitions of Care (Post Inpatient/ED visit) call.   Signature :Sherman Donaldson,CMA

## 2023-05-22 ENCOUNTER — Encounter: Payer: Self-pay | Admitting: Internal Medicine

## 2023-05-22 ENCOUNTER — Ambulatory Visit: Payer: Self-pay | Admitting: Internal Medicine

## 2023-05-22 ENCOUNTER — Ambulatory Visit: Admitting: Internal Medicine

## 2023-05-22 VITALS — BP 122/62 | HR 67 | Temp 97.9°F | Ht 62.0 in | Wt 138.6 lb

## 2023-05-22 DIAGNOSIS — M7918 Myalgia, other site: Secondary | ICD-10-CM
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
## 2023-06-05 ENCOUNTER — Encounter: Payer: Self-pay | Admitting: Physical Therapy

## 2023-06-05 ENCOUNTER — Ambulatory Visit: Admitting: Physical Therapy

## 2023-06-05 DIAGNOSIS — M546 Pain in thoracic spine: Secondary | ICD-10-CM | POA: Diagnosis not present

## 2023-06-05 DIAGNOSIS — R29898 Other symptoms and signs involving the musculoskeletal system: Secondary | ICD-10-CM
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
## 2023-06-06 ENCOUNTER — Telehealth: Payer: Self-pay

## 2023-06-06 NOTE — Telephone Encounter (Signed)
 Copied from CRM [id number]. Topic: General - Other >> Jun 01, 2023 11:12 AM Juliaette Ober wrote: Reason for CRM: patient needs phone number, name, and address of physical therapist dr. Linder Revere recommended because she have an appointment for Monday but dont have the information to contact physical therapist office, you can call or message patient with information.    Per EPIC:  Referral was placed to [facility] 06/05/23 at 9:30 am with MATTHEWS, STEPHANIE F.

## 2023-06-06 NOTE — Telephone Encounter (Addendum)
 Hello [facility] team!  I found the referral info in EPIC.  Where is the address and phone number of [facility] located in the referral info?  I need to give this to the patient.  Thank you.   Called patient.  Patient was able to get the information on [facility] and has rescheduled her appointment.  Patient verbalized understanding.

## 2023-06-26 ENCOUNTER — Telehealth: Payer: Self-pay | Admitting: Physical Therapy

## 2023-06-26 ENCOUNTER — Encounter: Admitting: Physical Therapy

## 2023-06-26 NOTE — Telephone Encounter (Signed)
 I called pt  to follow up after she missed her PT appointment today at 1:45 pm. I left a message with our clinic number for pt to follow up if she wished to make up her missed visit. [phone]). I also reminded pt of her next scheduled appointment on 07/03/2023 at 1:00 pm.   Jerrel Mor, PT, MPT 06/26/23 2:06 PM

## 2023-07-03 ENCOUNTER — Encounter: Payer: Self-pay | Admitting: Physical Therapy

## 2023-07-03 ENCOUNTER — Ambulatory Visit: Admitting: Physical Therapy

## 2023-07-03 DIAGNOSIS — R29898 Other symptoms and signs involving the musculoskeletal system: Secondary | ICD-10-CM | POA: Diagnosis not present

## 2023-07-03 DIAGNOSIS — M546 Pain in thoracic spine: Secondary | ICD-10-CM | POA: Diagnosis not present
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
## 2023-07-04 NOTE — Progress Notes (Unsigned)
 Cardiology Office Note:    Date:  07/05/2023   ID:  Lauren Massey, DOB 1955/11/25, MRN [id number]  PCP:  Carolyn Cisco, NP  Cardiologist:  Wendie Hamburg, MD  Electrophysiologist:  None   Referring MD: Adelia Homestead, *   Chief Complaint  Patient presents with   Sarcoidosis    History of Present Illness:    Lauren Massey is a 68 y.o. female with a hx of pulmonary sarcoidosis, hypertension who is being seen today for follow-up.  She was admitted to [facility] for VATS/robotic pleurectomy due to recurrent pneumothoraces in 02/2019.  Cardiology was consulted for preop evaluation and consideration of cardiac sarcoid eval.  TTE showed normal LV systolic function, normal RV function, mild PASP elevation.  No further cardiac work-up was recommended prior to surgery.  Outpatient follow-up for cardiac MRI was recommended.  She tolerated her procedure well, postoperative course was uncomplicated.  Cardiac MRI was done on 04/05/2019, which showed normal LV systolic function (EF 68%), normal RV systolic function (EF 53%).  Inferior RV insertion site LGE consistent with elevated pulmonary pressures, no evidence of cardiac sarcoid.  Zio patch x14 days on 05/07/2020 showed 1 episode of NSVT lasting 4 beats, 380 episodes of SVT (appeared atrial tachycardia) with longest lasting 25 seconds with average rate 111 bpm.  Echocardiogram 07/2021 showed EF 60 to 65%, normal RV systolic function, no significant valvular disease.  Since last clinic visit, she reports she is doing well.  She continues to have some shortness of breath but denies any chest pain.  Does report occasional lower extremity edema.  Reports rare palpitations that last for short duration.  She denies any lightheadedness or syncope.   Past Medical History:  Diagnosis Date   Cataract    right eye   Hypertension    Pneumothorax    Sarcoidosis    skin, eye, lung   Unspecified iridocyclitis     Past Surgical History:   Procedure Laterality Date   ABDOMINAL HYSTERECTOMY     APPENDECTOMY     BRONCHOSCOPY  2003   CATARACT EXTRACTION     COLONOSCOPY WITH PROPOFOL  N/A 05/21/2020   Procedure: COLONOSCOPY WITH PROPOFOL ;  Surgeon: Sergio Dandy, MD;  Location: [facility];  Service: Endoscopy;  Laterality: N/A;   INTERCOSTAL NERVE BLOCK Right 03/12/2019   Procedure: Intercostal Nerve Block;  Surgeon: Norita Beauvais, MD;  Location: [facility] OR;  Service: Thoracic;  Laterality: Right;   PLEURADESIS Right 08/23/2019   Procedure: MECHANICAL PLEURODESIS;  Surgeon: Norita Beauvais, MD;  Location: [facility] OR;  Service: Thoracic;  Laterality: Right;   TALC  PLEURODESIS Right 08/23/2019   Procedure: TALC  PLEURADESIS;  Surgeon: Norita Beauvais, MD;  Location: [facility] OR;  Service: Thoracic;  Laterality: Right;   VESICOVAGINAL FISTULA CLOSURE W/ TAH     VIDEO ASSISTED THORACOSCOPY Right 08/23/2019   Procedure: VIDEO ASSISTED THORACOSCOPY;  Surgeon: Norita Beauvais, MD;  Location: [facility] OR;  Service: Thoracic;  Laterality: Right;   VIDEO BRONCHOSCOPY N/A 03/12/2019   Procedure: Video Bronchoscopy;  Surgeon: Norita Beauvais, MD;  Location: [facility] OR;  Service: Thoracic;  Laterality: N/A;    Current Medications: Current Meds  Medication Sig   albuterol  (VENTOLIN  HFA) 108 (90 Base) MCG/ACT inhaler Inhale 2 puffs into the lungs every 6 (six) hours as needed for wheezing or shortness of breath.   folic acid  (FOLVITE ) 1 MG tablet Take 5 mg by mouth at bedtime.    hydrochlorothiazide  (MICROZIDE ) 12.5 MG capsule TAKE  1 CAPSULE BY MOUTH EVERY DAY.** OVERDUE FOR ANNUAL APPT MUST SEE PROVIDER FOR FUTURE REFILLS**   methotrexate  (RHEUMATREX) 2.5 MG tablet Take 7.5 mg by mouth every Wednesday. Caution:Chemotherapy. Protect from light. Takes 3 tabs a week   OXYGEN  Inhale into the lungs. 1.5 liters At night and during the day when she's active she uses 2-3 liters of oxygen    prednisoLONE  acetate (PRED FORTE ) 1 % ophthalmic suspension Place 1  drop into the right eye in the morning, at noon, and at bedtime.     Allergies:   Patient has no known allergies.   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  Tobacco Use   Smoking status: Never   Smokeless tobacco: Never  Vaping Use   Vaping status: Never Used  Substance and Sexual Activity   Alcohol use: No   Drug use: No   Sexual activity: Not on file  Other Topics Concern   Not on file  Social History Narrative   Not on file   Social Drivers of Health   Financial Resource Strain: Low Risk  (05/10/2021)   Overall Financial Resource Strain (CARDIA)    Difficulty of Paying Living Expenses: Not hard at all  Food Insecurity: No Food Insecurity (05/10/2021)   Hunger Vital Sign    Worried About Running Out of Food in the Last Year: Never true    Ran Out of Food in the Last Year: Never true  Transportation Needs: No Transportation Needs (05/10/2021)   PRAPARE - Administrator, Civil Service (Medical): No    Lack of Transportation (Non-Medical): No  Physical Activity: Inactive (05/10/2021)   Exercise Vital Sign    Days of Exercise per Week: 0 days    Minutes of Exercise per Session: 0 min  Stress: No Stress Concern Present (05/10/2021)   [employer] of Occupational Health - Occupational Stress Questionnaire    Feeling of Stress : Not at all  Social Connections: Socially Integrated (05/10/2021)   Social Connection and Isolation Panel [NHANES]    Frequency of Communication with Friends and Family: More than three times a week    Frequency of Social Gatherings with Friends and Family: More than three times a week    Attends Religious Services: More than 4 times per year    Active Member of [employer] or Organizations: Yes    Attends Probation officer: More than 4 times per year    Marital Status: Married     Family History: The patient's family history includes Alzheimer's disease in her mother; Cancer in her father; Diabetes in an other family member; Heart disease in her father. There is no history of Colon cancer, Esophageal cancer, Pancreatic cancer, or Stomach cancer.  ROS:   Please see the history of present illness.     All other systems reviewed and are negative.  EKGs/Labs/Other Studies Reviewed:    The following studies were reviewed today:   EKG:   07/21/2021: Normal sinus rhythm, rate 62, no ST abnormalities 07/05/2023: Sinus bradycardia with PACs, rate 59, no ST abnormalities   Recent Labs: 05/13/2023: BUN 19; Creatinine, Ser 0.80; Hemoglobin 14.6; Potassium 3.9; Sodium 138  Recent Lipid Panel    Component Value Date/Time  CHOL 245 (H) 02/09/2021 1508   TRIG 101.0 02/09/2021 1508   HDL 67.70 02/09/2021 1508   CHOLHDL 4 02/09/2021 1508   VLDL 20.2 02/09/2021 1508   LDLCALC 157 (H) 02/09/2021 1508   TTE 03/08/19: 1. Left ventricular ejection fraction, by estimation, is 60 to 65%. The  left ventricle has normal function. The left ventricle has no regional  wall motion abnormalities. Left ventricular diastolic parameters were  normal.   2. Right ventricular systolic function is normal. The right ventricular  size is normal. There is mildly elevated pulmonary artery systolic  pressure.   3. The mitral valve is normal in structure and function. Trivial mitral  valve regurgitation. No evidence of mitral stenosis.   4. The aortic valve is tricuspid. Aortic valve regurgitation is not  visualized. Mild aortic valve sclerosis is present, with no evidence of  aortic valve stenosis.   CMR 04/07/19: 1. Normal biventricular chamber size and systolic function. LVEF 68%, RVEF 53%.   2. Possible small focus of late gadolinium enhancement in the left ventricular myocardium at the inferior RV  insertion point at the mid ventricle. This finding can be seen with increased pulmonary pressures.   3.  No definite findings suggestive of cardiac sarcoidosis.    Physical Exam:    VS:  BP 122/79 (BP Location: Left Arm, Patient Position: Sitting)   Pulse (!) 59   Ht 5' 2 (1.575 m)   Wt 136 lb (61.7 kg)   SpO2 98%   BMI 24.87 kg/m     Wt Readings from Last 3 Encounters:  07/05/23 136 lb (61.7 kg)  05/22/23 138 lb 9.6 oz (62.9 kg)  05/13/23 147 lb 0.8 oz (66.7 kg)     GEN:  in no acute distress HEENT: Normal NECK: No JVD CARDIAC: RRR, no murmurs, rubs, gallops RESPIRATORY:  Clear to auscultation without rales, wheezing or rhonchi  ABDOMEN: Soft, non-tender, non-distended MUSCULOSKELETAL:  No edema; No deformity  SKIN: Warm and dry NEUROLOGIC:  Alert and oriented x 3 PSYCHIATRIC:  Normal affect   ASSESSMENT:    1. Palpitations   2. Essential hypertension   3. Sarcoidosis   4. Hyperlipidemia, unspecified hyperlipidemia type      PLAN:    SVT: Zio patch x14 days on 05/07/2020 showed 1 episode of NSVT lasting 4 beats, 380 episodes of SVT (appeared atrial tachycardia) with longest lasting 25 seconds with average rate 111 bpm.  Given short duration and appeared asymptomatic, was not started on treatment. - Reports rare palpitations, no treatment indicated at this time  Sarcoidosis: Known pulmonary sarcoid, cardiac MRI 04/05/19 shows no evidence of cardiac sarcoid. Echocardiogram 07/2021 showed EF 60 to 65%, normal RV systolic function, no significant valvular disease.  Chest pain: Description suggest noncardiac chest pain, as describes sharp pain lasting few seconds and resolves.  Denies any recent chest pain, no further cardiac work-up recommended at this time  Hypertension: On hydrochlorothiazide  12.5 mg daily.  Appears controlled  Hyperlipidemia: LDL 157 on 02/09/2021.  Reports had recent labs checked with PCP, will obtain records.  RTC in 1 year  Medication  Adjustments/Labs and Tests Ordered: Current medicines are reviewed at length with the patient today.  Concerns regarding medicines are outlined above.  Orders Placed This Encounter  Procedures   EKG 12-Lead   No orders of the defined types were placed in this encounter.   Patient Instructions  Medication Instructions:  Continue current medications *If you need a refill on your cardiac medications before your next  appointment, please call your pharmacy*  Lab Work: None. We will request your recent labs from your PCP If you have labs (blood work) drawn today and your tests are completely normal, you will receive your results only by: MyChart Message (if you have MyChart) OR A paper copy in the mail If you have any lab test that is abnormal or we need to change your treatment, we will call you to review the results.  Testing/Procedures: none  Follow-Up: At [facility], you and your health needs are our priority.  As part of our continuing mission to provide you with exceptional heart care, our providers are all part of one team.  This team includes your primary Cardiologist (physician) and Advanced Practice Providers or APPs (Physician Assistants and Nurse Practitioners) who all work together to provide you with the care you need, when you need it.  Your next appointment:   1 year   Provider:   Wendie Hamburg, MD    We recommend signing up for the patient portal called MyChart.  Sign up information is provided on this After Visit Summary.  MyChart is used to connect with patients for Virtual Visits (Telemedicine).  Patients are able to view lab/test results, encounter notes, upcoming appointments, etc.  Non-urgent messages can be sent to your provider as well.   To learn more about what you can do with MyChart, go to [email or web address].   Other Instructions none       Signed, Wendie Hamburg, MD  07/05/2023 12:07 PM    [facility]  HeartCare

## 2023-07-05 ENCOUNTER — Encounter: Payer: Self-pay | Admitting: *Deleted

## 2023-07-05 ENCOUNTER — Encounter: Payer: Self-pay | Admitting: Cardiology

## 2023-07-05 ENCOUNTER — Ambulatory Visit: Payer: Medicare HMO | Attending: Cardiology | Admitting: Cardiology

## 2023-07-05 VITALS — BP 122/79 | HR 59 | Ht 62.0 in | Wt 136.0 lb

## 2023-07-05 DIAGNOSIS — D869 Sarcoidosis, unspecified: Secondary | ICD-10-CM | POA: Diagnosis not present

## 2023-07-05 DIAGNOSIS — R002 Palpitations: Secondary | ICD-10-CM | POA: Diagnosis not present

## 2023-07-05 DIAGNOSIS — I10 Essential (primary) hypertension: Secondary | ICD-10-CM

## 2023-07-05 DIAGNOSIS — E785 Hyperlipidemia, unspecified: Secondary | ICD-10-CM | POA: Diagnosis not present

## 2023-07-05 NOTE — Patient Instructions (Signed)
 Medication Instructions:  Continue current medications *If you need a refill on your cardiac medications before your next appointment, please call your pharmacy*  Lab Work: None. We will request your recent labs from your PCP If you have labs (blood work) drawn today and your tests are completely normal, you will receive your results only by: MyChart Message (if you have MyChart) OR A paper copy in the mail If you have any lab test that is abnormal or we need to change your treatment, we will call you to review the results.  Testing/Procedures: none  Follow-Up: At [facility] you and your health needs are our priority.  As part of our continuing mission to provide you with exceptional heart care, our providers are all part of one team.  This team includes your primary Cardiologist (physician) and Advanced Practice Providers or APPs (Physician Assistants and Nurse Practitioners) who all work together to provide you with the care you need, when you need it.  Your next appointment:   1 year   Provider:   Wendie Hamburg, MD    We recommend signing up for the patient portal called MyChart.  Sign up information is provided on this After Visit Summary.  MyChart is used to connect with patients for Virtual Visits (Telemedicine).  Patients are able to view lab/test results, encounter notes, upcoming appointments, etc.  Non-urgent messages can be sent to your provider as well.   To learn more about what you can do with MyChart, go to [email or web address].   Other Instructions none

## 2023-07-10 ENCOUNTER — Encounter: Admitting: Physical Therapy

## 2023-07-17 ENCOUNTER — Encounter: Payer: Self-pay | Admitting: Physical Therapy

## 2023-07-17 ENCOUNTER — Ambulatory Visit: Admitting: Physical Therapy

## 2023-07-17 DIAGNOSIS — R29898 Other symptoms and signs involving the musculoskeletal system: Secondary | ICD-10-CM | POA: Diagnosis not present

## 2023-07-17 DIAGNOSIS — M546 Pain in thoracic spine: Secondary | ICD-10-CM

## 2023-07-17 NOTE — Therapy (Signed)
 OUTPATIENT PHYSICAL THERAPY DISCHARGE SUMMARY   Patient Name: Lauren Massey MRN: [id number] DOB:08-25-1955, 68 y.o., female Today's Date: 07/17/2023  END OF SESSION:  PT End of Session - 07/17/23 1302     Visit Number 3    Number of Visits 6    Date for PT Re-Evaluation 07/17/23    Authorization Type [employer] $25 copay    Progress Note Due on Visit 10    PT Start Time 1300    PT Stop Time 1318    PT Time Calculation (min) 18 min    Activity Tolerance Patient tolerated treatment well    Behavior During Therapy WFL for tasks assessed/performed            Past Medical History:  Diagnosis Date   Cataract    right eye   Hypertension    Pneumothorax    Sarcoidosis    skin, eye, lung   Unspecified iridocyclitis    Past Surgical History:  Procedure Laterality Date   ABDOMINAL HYSTERECTOMY     APPENDECTOMY     BRONCHOSCOPY  2003   CATARACT EXTRACTION     COLONOSCOPY WITH PROPOFOL  N/A 05/21/2020   Procedure: COLONOSCOPY WITH PROPOFOL ;  Surgeon: Shila Gustav GAILS, MD;  Location: [facility];  Service: Endoscopy;  Laterality: N/A;   INTERCOSTAL NERVE BLOCK Right 03/12/2019   Procedure: Intercostal Nerve Block;  Surgeon: Army Dallas NOVAK, MD;  Location: [facility] OR;  Service: Thoracic;  Laterality: Right;   PLEURADESIS Right 08/23/2019   Procedure: MECHANICAL PLEURODESIS;  Surgeon: Army Dallas NOVAK, MD;  Location: [facility] OR;  Service: Thoracic;  Laterality: Right;   TALC  PLEURODESIS Right 08/23/2019   Procedure: TALC  PLEURADESIS;  Surgeon: Army Dallas NOVAK, MD;  Location: [facility] OR;  Service: Thoracic;  Laterality: Right;   VESICOVAGINAL FISTULA CLOSURE W/ TAH     VIDEO ASSISTED THORACOSCOPY Right 08/23/2019   Procedure: VIDEO ASSISTED THORACOSCOPY;  Surgeon: Army Dallas NOVAK, MD;  Location: [facility] OR;  Service: Thoracic;  Laterality: Right;   VIDEO BRONCHOSCOPY N/A 03/12/2019   Procedure: Video Bronchoscopy;  Surgeon: Army Dallas NOVAK, MD;  Location: [facility] OR;  Service: Thoracic;   Laterality: N/A;   Patient Active Problem List   Diagnosis Date Noted   Acute left-sided low back pain with left-sided sciatica 02/10/2021   History of colonic polyps    Acute respiratory failure with hypoxia (HCC) 08/21/2019   ILD (interstitial lung disease) (HCC) 05/31/2019   Essential hypertension 04/12/2019   Pneumothorax on right 03/06/2019   Pneumothorax 02/22/2019   Chronic respiratory failure with hypoxia (HCC) 03/17/2017   Routine general medical examination at a health care facility 02/02/2017   Loss of weight 08/03/2015   SARCOIDOSIS, PULMONARY 05/18/2007   Iridocyclitis 05/18/2007    PCP: Delores Rojelio Caldron, NP  REFERRING PROVIDER: Neysa Reggy BIRCH, MD  REFERRING DIAG: [id number] (ICD-10-CM) - Musculoskeletal pain  Rationale for Evaluation and Treatment: Rehabilitation  THERAPY DIAG:  Pain in thoracic spine  Other symptoms and signs involving the musculoskeletal system  ONSET DATE: 05/13/23   SUBJECTIVE:  SUBJECTIVE STATEMENT: No pain over the past week.  PERTINENT HISTORY:  HTN, sarcoidosis, hx PTX  PAIN:  Are you having pain? Yes: NPRS scale: 0/10 currently, no pain recently/10 Pain location: mid and upper back; across Pain description: soreness Aggravating factors: sitting forward with a rounded posture Relieving factors: extension preference; or sitting back  PRECAUTIONS:  None  RED FLAGS: None   WEIGHT BEARING RESTRICTIONS:  No  FALLS:  Has patient fallen in last 6 months? No  LIVING ENVIRONMENT: Lives with: lives with their spouse Lives in: Mobile home Stairs: Yes: External: 5 steps; on right going up Has following equipment at home: has home O2  OCCUPATION:  Retired - Production manager  PLOF:  Independent and Leisure: church, bible study, bingo, recently  was going to the gym (stopped after the accident) Artist  PATIENT GOALS:  Improve pain, feel better   OBJECTIVE:  Note: Objective measures were completed at Evaluation unless otherwise noted.  DIAGNOSTIC FINDINGS:  CT Chest: No acute traumatic injury identified in the chest, abdomen, or pelvis.  CT Cervical Spine: 1. No acute traumatic injury identified in the cervical spine. 2. Chronic cervical spine degeneration.   PATIENT SURVEYS:  Patient-Specific Activity Scoring Scheme  0 represents "unable to perform." 10 represents "able to perform at prior level. 0 1 2 3 4 5 6 7 8 9  10 (Date and Score)   Activity Eval   07/17/23  1. Vacuum/Mopping 5 10   2. Get down on floor/bending 5  10  Score 5 10   Total score = sum of the activity scores/number of activities Minimum detectable change (90%CI) for average score = 2 points Minimum detectable change (90%CI) for single activity score = 3 points    COGNITIVE STATUS: Within functional limits for tasks assessed   SENSATION: WFL  POSTURE:  rounded shoulders and forward head  GAIT: 06/05/23 Comments: independent with mobility   PALPATION: 06/05/23 very tender thoracic paraspinals and rhomboids    THORACOLUMBAR ROM:   ROM A/PROM  eval  07/17/23  Flexion Limited 50% WNL  Extension WNL   Right lateral flexion WNL (with pain midback) WNL  Left lateral flexion WNL (with pain midback) WNL  Right rotation WNL   Left rotation WNL    (Blank rows = not tested)     TREATMENT:                                                                                                                              DATE:  07/17/23 TherEx NuStep L6/4 x 5 min - monitoring O2 sats ROM measurements - see above for details Reviewed HEP  - pt without questions or concerns, discussed return to gym and to start light and build up as able.  Also not to expect to pick up where she left off as it's been 3 months since going to the gym.   Pt verbalized understanding.   07/03/23:  TherEx Seated trunk flexion over  green physio-ball x 10 holding end range x 5 sec Seated scapular retraction x 12 holding 5 sec Standing trunk extension, elbows on the wall x 10 holding 10 sec Dead bug x 20 TherAct Seated Rows: red TB  2 x 10  UE reaching across body using 1 # weight 2 x 5 bil  Bil UE D1 pattern using 1# weight (pt's O2 sats dropped to 86% requiring deep breathing for 1 minute to increase sats to 92%) Sit to stand: 2 x 5 taking seated rest break between sets to prevent O2 sats from dropping   06/05/23 TherEx See HEP - demonstrated with trial reps performed PRN, mod cues for comprehension    Self Care Educated on clinical findings and POC as well as return to exercise guidelines     PATIENT EDUCATION:  Education details: HEP Person educated: Patient Education method: Programmer, multimedia, Facilities manager, and Handouts Education comprehension: verbalized understanding, returned demonstration, and needs further education  HOME EXERCISE PROGRAM: Access Code: [email or web address] URL: [email or web address] Date: 06/05/2023 Prepared by: Corean Ku  Exercises - Standing 'L' Stretch at Counter  - 2 x daily - 7 x weekly - 1 sets - 5 reps - 10-15 sec hold - Seated Thoracic Lumbar Extension  - 2 x daily - 7 x weekly - 1 sets - 10 reps - 5 sec hold - Standing Scapular Retraction  - 2 x daily - 7 x weekly - 1 sets - 10 reps - 5 sec hold - Shoulder External Rotation and Scapular Retraction  - 2 x daily - 7 x weekly - 1 sets - 10 reps - 1-2 sec hold - Standing Paraspinals Mobilization with Small Ball on Wall  - 2 x daily - 7 x weekly - 1 sets - 1 reps - 3-4 min hold   ASSESSMENT:  CLINICAL IMPRESSION: Pt has met all goals at this time and feels ready to d/c from PT at this time.  Will d/c PT.    OBJECTIVE IMPAIRMENTS: decreased ROM, hypomobility, increased fascial restrictions, increased muscle spasms, impaired flexibility,  postural dysfunction, and pain.   ACTIVITY LIMITATIONS: carrying, lifting, bending, sitting, standing, and locomotion level  PARTICIPATION LIMITATIONS: meal prep, cleaning, laundry, community activity, and yard work  PERSONAL FACTORS: Past/current experiences and 3+ comorbidities: HTN, sarcoidosis, hx PTX are also affecting patient's functional outcome.   REHAB POTENTIAL: Good  CLINICAL DECISION MAKING: Evolving/moderate complexity  EVALUATION COMPLEXITY: Moderate   GOALS: Goals reviewed with patient? Yes  SHORT TERM GOALS: Target date: 06/26/2023  Independent with initial HEP Goal status: MET 07/17/23   LONG TERM GOALS: Target date: 07/17/2023  Independent with final HEP Goal status: MET 07/17/23  2.  PSFS score improved by 2 points Goal status: MET 07/17/23  3.  Thoracolumbar ROM improved to WNL without increase in pain for improved functional mobility Goal status: MET 07/17/23  4.  Report pain < 3/10 with ADLs and IADLs for improved function Goal status: MET 07/17/23    PLAN:  PT FREQUENCY: 1x/week  PT DURATION: 6 weeks  PLANNED INTERVENTIONS: 97164- PT Re-evaluation, 97750- Physical Performance Testing, 97110-Therapeutic exercises, 97530- Therapeutic activity, 97112- Neuromuscular re-education, 97535- Self Care, 02859- Manual therapy, [id number]- Aquatic Therapy, G0283- Electrical stimulation (unattended), [phone]- Traction (mechanical), Patient/Family education, Taping, Dry Needling, Joint mobilization, Spinal mobilization, Cryotherapy, and Moist heat.  PLAN FOR NEXT SESSION:  d/c PT today    NEXT MD VISIT: 05/21/24   Corean JULIANNA Ku, PT, DPT 07/17/23 1:30 PM    PHYSICAL THERAPY DISCHARGE SUMMARY  Visits from  Start of Care: 3  Current functional level related to goals / functional outcomes: See above   Remaining deficits: See above   Education / Equipment: HEP   Patient agrees to discharge. Patient goals were met. Patient is being discharged due to  meeting the stated rehab goals.  Corean JULIANNA Ku, PT, DPT 07/17/23 1:30 PM

## 2023-11-06 ENCOUNTER — Ambulatory Visit: Payer: Self-pay | Admitting: Internal Medicine

## 2023-11-06 NOTE — Telephone Encounter (Signed)
 Dr. Neysa, Patient refusing office visit.  She was last seen April of 2025.  She wants to know why she is requiring oxygen  more often now.  Please advise.  Thank you.

## 2023-11-06 NOTE — Telephone Encounter (Signed)
 Unless there is something obvious, like an infection, I have no way to know why she is more short of breath. Many different problems could cause this. She needs to be seen and examined- if not here, then at an Urgent Care, ED or her primary care office. She may need a chest xray, labs or other tests to explain what is going on and find ways to help.

## 2023-11-06 NOTE — Telephone Encounter (Signed)
 FYI Only or Action Required?: Action required by provider: clinical question for provider.  Patient is followed in Pulmonology for sarcoidosis, last seen on 05/22/2023 by Neysa Reggy BIRCH, MD.  Called Nurse Triage reporting Shortness of Breath.  Symptoms began several weeks ago.  Interventions attempted: Home oxygen  use.  Symptoms are: gradually worsening.  Triage Disposition: See HCP Within 4 Hours (Or PCP Triage)  Patient/caregiver understands and will follow disposition?: No, wishes to speak with PCP  RN offered appt with Dr. Neysa for tomorrow. Pt refused. RN did advise that orders say for pt to use 1.5 liters at night and 2-3 liters with activity. She stated understanding but would like to know what has changed that she is requiring it more often.      Copied from CRM [id number]. Topic: Clinical - Red Word Triage >> Nov 06, 2023 11:47 AM Lauren Massey wrote: Red Word that prompted transfer to Nurse Triage: New SOB when off oxygen , is having to use oxygen  24/07 to be comfortable. O2 down to 75 without oxygen  with being upright, moving etc. Reason for Disposition  [1] Longstanding difficulty breathing (e.g., CHF, COPD, emphysema) AND [2] WORSE than normal  Answer Assessment - Initial Assessment Questions Pt states for the last 3 weeks she has been requiring more oxygen  than normal. She states typically she goes all day without it but feels like she is needing it more. She states she does feel short of breath when she needs it. She states her breathing level has changed. She is currently on 1.5 liters at night and during the day as needed.     1. RESPIRATORY STATUS: Describe your breathing? (e.g., wheezing, shortness of breath, unable to speak, severe coughing)     Short of breath, can hear self breath 2. ONSET: When did this breathing problem begin?      intermittent 3. PATTERN Does the difficult breathing come and go, or has it been constant since it started?      More  constant 4. SEVERITY: How bad is your breathing? (e.g., mild, moderate, severe)       5. RECURRENT SYMPTOM: Have you had difficulty breathing before? If Yes, ask: When was the last time? and What happened that time?      yes 6. CARDIAC HISTORY: Do you have any history of heart disease? (e.g., heart attack, angina, bypass surgery, angioplasty)      hld 7. LUNG HISTORY: Do you have any history of lung disease?  (e.g., pulmonary embolus, asthma, emphysema)      8. CAUSE: What do you think is causing the breathing problem?      Unsure  9. OTHER SYMPTOMS: Do you have any other symptoms? (e.g., chest pain, cough, dizziness, fever, runny nose)     Cough, requiring oxygen  more frequently.  10. O2 SATURATION MONITOR:  Do you use an oxygen  saturation monitor (pulse oximeter) at home? If Yes, ask: What is your reading (oxygen  level) today? What is your usual oxygen  saturation reading? (e.g., 95%)       Unable to check  Protocols used: Breathing Difficulty-A-AH

## 2023-11-06 NOTE — Telephone Encounter (Signed)
 I called and spoke with the pt and notified of response from Dr. Neysa. She verbalized understanding. I offered her visit here tomorrow afternoon and she refused, stating her eye appt was too close to this. She has agreed to call her PCP for appt and if can not see them, she will go to UC.

## 2023-12-11 ENCOUNTER — Ambulatory Visit: Payer: Self-pay

## 2023-12-11 NOTE — Telephone Encounter (Signed)
 FYI Only or Action Required?: Action required by provider: request for appointment.  Patient was last seen in primary care on 02/09/2021 by Rollene Almarie LABOR, MD.  Called Nurse Triage reporting Cough.  Symptoms began several weeks ago.  Interventions attempted: Rest, hydration, or home remedies.  Symptoms are: gradually worsening. Cough and fatigue are worsening, using home O2 more frequently. Triage Disposition: See Physician Within 24 Hours  Patient/caregiver understands and will follow disposition?: Yes      Copied from CRM #[id number]. Topic: Clinical - Red Word Triage >> Dec 11, 2023 11:38 AM Celestine FALCON wrote: Red Word that prompted transfer to Nurse Triage: Pt is experiencing fatigue the pt classified as extreme, constant cough especially at night when attempting to sleep (has to sit up to get rest), and a complete lack of energy. Pt has been having these symptoms for the last month or so.  Pt sees Dr. Neysa in the [facility] clinic, but Dr. Neysa is retiring at the end of the year. Reason for Disposition  [1] Continuous (nonstop) coughing interferes with work or school AND [2] no improvement using cough treatment per Care Advice  Answer Assessment - Initial Assessment Questions 1. ONSET: When did the cough begin?      3 weeks 2. SEVERITY: How bad is the cough today?      Sever,worse at night 3. SPUTUM: Describe the color of your sputum (e.g., none, dry cough; clear, white, yellow, green)     clear 4. HEMOPTYSIS: Are you coughing up any blood? If Yes, ask: How much? (e.g., flecks, streaks, tablespoons, etc.)     no 5. DIFFICULTY BREATHING: Are you having difficulty breathing? If Yes, ask: How bad is it? (e.g., mild, moderate, severe)      SOB 6. FEVER: Do you have a fever? If Yes, ask: What is your temperature, how was it measured, and when did it start?     NO 7. CARDIAC HISTORY: Do you have any history of heart disease? (e.g., heart attack,  congestive heart failure)      no 8. LUNG HISTORY: Do you have any history of lung disease?  (e.g., pulmonary embolus, asthma, emphysema)     yes 9. PE RISK FACTORS: Do you have a history of blood clots? (or: recent major surgery, recent prolonged travel, bedridden)     no 10. OTHER SYMPTOMS: Do you have any other symptoms? (e.g., runny nose, wheezing, chest pain)       fatigue 11. PREGNANCY: Is there any chance you are pregnant? When was your last menstrual period?       no 12. TRAVEL: Have you traveled out of the country in the last month? (e.g., travel history, exposures)       no  Protocols used: Cough - Acute Productive-A-AH

## 2023-12-12 ENCOUNTER — Encounter: Payer: Self-pay | Admitting: Adult Health

## 2023-12-12 ENCOUNTER — Ambulatory Visit (INDEPENDENT_AMBULATORY_CARE_PROVIDER_SITE_OTHER)

## 2023-12-12 ENCOUNTER — Ambulatory Visit: Admitting: Adult Health

## 2023-12-12 VITALS — BP 130/72 | HR 72 | Temp 97.7°F | Ht 62.0 in | Wt 138.4 lb

## 2023-12-12 DIAGNOSIS — D869 Sarcoidosis, unspecified: Secondary | ICD-10-CM | POA: Diagnosis not present

## 2023-12-12 DIAGNOSIS — J9611 Chronic respiratory failure with hypoxia: Secondary | ICD-10-CM | POA: Diagnosis not present

## 2023-12-12 DIAGNOSIS — J209 Acute bronchitis, unspecified: Secondary | ICD-10-CM

## 2023-12-12 DIAGNOSIS — J849 Interstitial pulmonary disease, unspecified: Secondary | ICD-10-CM

## 2023-12-12 MED ORDER — PREDNISONE 20 MG PO TABS: 20.0000 mg | ORAL_TABLET | Freq: Every day | ORAL | 0 refills | Status: DC

## 2023-12-12 MED ORDER — AZITHROMYCIN 250 MG PO TABS: ORAL_TABLET | ORAL | 0 refills | Status: AC

## 2023-12-12 NOTE — Progress Notes (Signed)
 @Patient  ID: Lauren Massey, female    DOB: February 27, 1955, 68 y.o.   MRN: [id number]  Chief Complaint  Patient presents with   Acute Visit    COUGH    Referring provider: Delores Rojelio Caldron, NP  HPI: 68 yo female never smoker followed for Sarcoid with multisystem involvement with pulmonary (Fibrocystic changes) , cutaneous, eye and Chronic respiratory Failure on O2  History of recurrent pneumothorax s/p VATS/Pleurodesis 02/2019 and 07/2019     TEST/EVENTS : Reviewed 12/12/2023  bronchoscopy 2000. Failed Plaquenil  for skin rash. ACE 02/19/15- elevated 97, down from >100 07/2014.  ACE 05/12/16-   50 (9-67) Office Spirometry 08/06/2015-moderate restriction. FVC 1.53/63%, FEV1 1.19/62%, FEV1/FVC 0.78 Room air saturation at rest on arrival 03/14/16- 88%, desaturating to 85% with ambulation and improving to 98% ambulating on 2 L. PFT 03/16/17-moderately severe restriction, severe reduction of diffusion.  FVC 1.62/66%, FEV1 1.46/76%, ratio 0.90, TLC 50%, DLCO 26% Had VATS/ robotic pleurectomy 02/2019 and again 08/23/19-for recurrent ptx realted to fibrocystic sarcoid. 6 MWT 10/01/19- Arrived RA 96%, desat to 87% on room air, corrected to 91% on 2L. Walk test 03/28/22- quickly desat to 88% room air here. Could not sustain normal on 3L. Discussed the use of AI scribe software for clinical note transcription with the patient, who gave verbal consent to proceed.  History of Present Illness Lauren Massey is a 68 year old female with sarcoidosis who presents with a persistent cough.   She has experienced a persistent cough for the past three to four weeks, primarily occurring at night. The cough is productive, with thick mucus that is sometimes discolored. No associated flu-like symptoms such as fever, body aches, or chills are present. No postnasal drip, sinus drainage, heartburn, indigestion, or reflux. An albuterol  inhaler provides minimal relief.  Her medical history includes sarcoidosis, managed  with methotrexate  7.5 mg weekly and folic acid . A CT scan was performed earlier this year in April showed severe chronic sarcoidosis unchanged.  She denies recent antibiotic or steroid use for respiratory issues. No chest pain, calf pain, or syncope is reported. She has no known allergies to medications, including antibiotics and prednisone .  She reports low energy levels, attributing this to not eating breakfast and lack of exercise. Sleep quality is generally good, except when interrupted by the cough. She uses supplemental oxygen , typically 2 liters during the day and 1.5 liters at night, adjusting as needed based on activity level.     No Known Allergies  Immunization History  Administered Date(s) Administered   Fluad Quad(high Dose 65+) 12/03/2020   Influenza Split 12/01/2011   Influenza Whole 10/19/2007   Influenza,inj,Quad PF,6+ Mos 10/01/2013, 02/19/2015, 11/09/2015, 11/02/2017, 02/08/2019, 10/01/2019   Influenza-Unspecified 09/24/2021   PFIZER(Purple Top)SARS-COV-2 Vaccination 04/13/2019, 05/04/2019   Pneumococcal Polysaccharide-23 12/01/2011, 04/06/2020   Tdap 02/05/2018    Past Medical History:  Diagnosis Date   Cataract    right eye   Hypertension    Pneumothorax    Sarcoidosis    skin, eye, lung   Unspecified iridocyclitis     Tobacco History: Social History   Tobacco Use  Smoking Status Never  Smokeless Tobacco Never   Counseling given: Not Answered   Outpatient Medications Prior to Visit  Medication Sig Dispense Refill   albuterol  (VENTOLIN  HFA) 108 (90 Base) MCG/ACT inhaler Inhale 2 puffs into the lungs every 6 (six) hours as needed for wheezing or shortness of breath. 18 g 12   folic acid  (FOLVITE ) 1 MG tablet Take 5 mg by  mouth at bedtime.      hydrochlorothiazide  (MICROZIDE ) 12.5 MG capsule TAKE 1 CAPSULE BY MOUTH EVERY DAY.** OVERDUE FOR ANNUAL APPT MUST SEE PROVIDER FOR FUTURE REFILLS** 30 capsule 0   methotrexate  (RHEUMATREX) 2.5 MG tablet Take  7.5 mg by mouth every Wednesday. Caution:Chemotherapy. Protect from light. Takes 3 tabs a week     OXYGEN  Inhale into the lungs. 1.5 liters At night and during the day when she's active she uses 2-3 liters of oxygen      prednisoLONE  acetate (PRED FORTE ) 1 % ophthalmic suspension Place 1 drop into the right eye in the morning, at noon, and at bedtime.     cyclobenzaprine  (FLEXERIL ) 10 MG tablet Take 0.5-1 tablets (5-10 mg total) by mouth 2 (two) times daily as needed for muscle spasms. (Patient not taking: Reported on 12/12/2023) 20 tablet 0   traMADol  (ULTRAM ) 50 MG tablet Take 1 tablet (50 mg total) by mouth every 6 (six) hours as needed. (Patient not taking: Reported on 12/12/2023) 15 tablet 0   No facility-administered medications prior to visit.     Review of Systems:   Constitutional:   No  weight loss, night sweats,  Fevers, chills, +fatigue, or  lassitude.  HEENT:   No headaches,  Difficulty swallowing,  Tooth/dental problems, or  Sore throat,                No sneezing, itching, ear ache, nasal congestion, post nasal drip,   CV:  No chest pain,  Orthopnea, PND, swelling in lower extremities, anasarca, dizziness, palpitations, syncope.   GI  No heartburn, indigestion, abdominal pain, nausea, vomiting, diarrhea, change in bowel habits, loss of appetite, bloody stools.   Resp:  No chest wall deformity  Skin: no rash or lesions.  GU: no dysuria, change in color of urine, no urgency or frequency.  No flank pain, no hematuria   MS:  No joint pain or swelling.  No decreased range of motion.  No back pain.    Physical Exam  BP 130/72   Pulse 72   Temp 97.7 F (36.5 C)   Ht 5' 2 (1.575 m) Comment: Per pt  Wt 138 lb 6.4 oz (62.8 kg)   SpO2 95% Comment: 3L PULSE O2  BMI 25.31 kg/m   GEN: A/Ox3; pleasant , NAD, well nourished    HEENT:  [place]/AT,  NOSE-clear, THROAT-clear, no lesions, no postnasal drip or exudate noted.   NECK:  Supple w/ fair ROM; no JVD; normal carotid  impulses w/o bruits; no thyromegaly or nodules palpated; no lymphadenopathy.    RESP  Clear  P & A; w/o, wheezes/ rales/ or rhonchi. no accessory muscle use, no dullness to percussion  CARD:  RRR, no m/r/g, no peripheral edema, pulses intact, no cyanosis or clubbing.  GI:   Soft & nt; nml bowel sounds; no organomegaly or masses detected.   Musco: Warm bil, no deformities or joint swelling noted.   Neuro: alert, no focal deficits noted.    Skin: Warm, no lesions or rashes    Lab Results:Reviewed 12/12/2023   CBC   BMET     Imaging: DG Chest 2 View Result Date: 12/12/2023 CLINICAL DATA:  Cough.  Sarcoid. EXAM: CHEST - 2 VIEW COMPARISON:  Chest radiograph dated 05/13/2023. FINDINGS: Diffuse chronic interstitial coarsening and upper lobe predominant confluent reticular changes in keeping with sarcoid. Small right pleural effusion as seen previously. No new consolidation. No pleural effusion pneumothorax. Calcified left hilar granuloma. Stable cardiac silhouette. No acute osseous  pathology. IMPRESSION: 1. No acute cardiopulmonary process. 2. Chronic interstitial changes in keeping with sarcoid. Electronically Signed   By: Vanetta Chou M.D.   On: 12/12/2023 12:55    Administration History     None          Latest Ref Rng & Units 03/16/2017    9:00 AM 10/01/2013    4:16 PM  PFT Results  FVC-Pre L 1.63  1.95   FVC-Predicted Pre % 67  77   FVC-Post L 1.62  1.93   FVC-Predicted Post % 66  76   Pre FEV1/FVC % % 87  80   Post FEV1/FCV % % 90  83   FEV1-Pre L 1.42  1.56   FEV1-Predicted Pre % 74  78   FEV1-Post L 1.46  1.61   DLCO uncorrected ml/min/mmHg 5.87  13.62   DLCO UNC% % 26  61   DLVA Predicted % 55  100   TLC L 2.43  3.00   TLC % Predicted % 50  62   RV % Predicted % 41  59     No results found for: NITRICOXIDE      No data to display              Assessment & Plan:   Assessment and Plan Assessment & Plan Acute bronchitis in patient with  sarcoidosis  -exacerbation She has experienced a cough for 3-4 weeks, primarily at night, with productive sputum but no fever, sinus drainage, or reflux.  Chest x-ray shows chronic changes without acute process.  This is likely secondary bronchitis due to a viral illness, exacerbated by sarcoidosis Azithromycin (Z-Pak) is prescribed for bronchitis, along with a short course of prednisone  to reduce inflammation. Over-the-counter Delsym or liquid Mucinex DM is recommended for cough, and localized heat is advised for throat irritation. A breathing test is scheduled for baseline assessment.  Sarcoidosis on methotrexate  and folic acid  multisystem involvement-mild pulmonary flare Sarcoidosis is managed with methotrexate  7.5 mg weekly and folic acid , with no recent exacerbations. Care is transitioning to Dr. Kassie, due to Dr. Saundra retirement. Continue methotrexate  7.5 mg weekly and folic acid  supplementation. Continue follow-up with rheumatology.  Continue with routine eye exams  Chronic supplemental oxygen  use  -stable She uses 2 liters of oxygen  during the day and 1.5 liters at night, adjusting based on activity level. Continue the current oxygen  therapy regimen.        Madelin Stank, NP 12/12/2023

## 2023-12-12 NOTE — Patient Instructions (Addendum)
 Chest xray today  Begin ZPack take as directed.  Prednisone  20mg  daily for 5 days  Albuterol  inhaler As needed   Liquid Mucinex MD or Delsym 2 tsp Twice daily  As needed  cough.  Eye exams as planned.  Continue on Methrotrexate and Folic acid  .  Keep follow up with Rheumatology.  Continue on Oxygen  2l/m-3 L/m   Follow up with 4 months and As needed  - Dr. Kassie -[facility] location with PFT  Please contact office for sooner follow up if symptoms do not improve or worsen or seek emergency care

## 2023-12-14 ENCOUNTER — Ambulatory Visit: Payer: Self-pay | Admitting: Adult Health

## 2024-01-05 ENCOUNTER — Telehealth: Payer: Self-pay

## 2024-01-05 NOTE — Telephone Encounter (Signed)
 RN observed that patient was past due on her next colonoscopy and had made plans for patient to have PV and procedure. Later, RN observed that patient has advanced sarcoidosis and is on oxygen .  RN reaching out to Dr. Nandigam to inquire as to how she would like for patient to proceed with needed procedure.

## 2024-01-08 ENCOUNTER — Encounter

## 2024-01-08 NOTE — Telephone Encounter (Signed)
 Please schedule office visit with me or APP to discuss, thanks

## 2024-01-08 NOTE — Telephone Encounter (Signed)
 Called and spoke with patient. She is scheduled on 02/14/24.

## 2024-02-13 ENCOUNTER — Encounter (HOSPITAL_BASED_OUTPATIENT_CLINIC_OR_DEPARTMENT_OTHER): Payer: Self-pay | Admitting: Pulmonary Disease

## 2024-02-14 ENCOUNTER — Telehealth: Payer: Self-pay | Admitting: Gastroenterology

## 2024-02-14 ENCOUNTER — Encounter: Payer: Self-pay | Admitting: Gastroenterology

## 2024-02-14 ENCOUNTER — Ambulatory Visit: Admitting: Gastroenterology

## 2024-02-14 VITALS — BP 136/78 | HR 72 | Ht 62.0 in | Wt 143.2 lb

## 2024-02-14 DIAGNOSIS — Z9981 Dependence on supplemental oxygen: Secondary | ICD-10-CM | POA: Diagnosis not present

## 2024-02-14 DIAGNOSIS — D869 Sarcoidosis, unspecified: Secondary | ICD-10-CM | POA: Diagnosis not present

## 2024-02-14 DIAGNOSIS — J9611 Chronic respiratory failure with hypoxia: Secondary | ICD-10-CM

## 2024-02-14 DIAGNOSIS — Z860100 Personal history of colon polyps, unspecified: Secondary | ICD-10-CM

## 2024-02-14 NOTE — Progress Notes (Signed)
 "  Lauren Massey [id number] 1955/09/10   Chief Complaint: Discuss colonoscopy  Referring Provider: Delores Rojelio Caldron, NP Primary GI MD: Dr. Shila  HPI: Lauren Massey is a 69 y.o. female with past medical history of HTN, sarcoidosis, adenomatous colon polyps, appendectomy, hysterectomy who presents today to discuss colonoscopy.    Patient follows with pulmonology.  Patient has sarcoidosis with chronic respiratory failure on oxygen .  History of recurrent pneumothorax 2021.  At office visit with pulmonology 12/12/2023 had been experiencing a cough for 3 to 4 weeks with productive sputum.  Thought to have secondary bronchitis due to viral illness, exacerbated by sarcoidosis.  Azithromycin  was prescribed along with a short course of prednisone  to reduce inflammation.  On 2 L of oxygen  during the day and 1.5 L at night at that time.   Discussed the use of AI scribe software for clinical note transcription with the patient, who gave verbal consent to proceed.  History of Present Illness Lauren Massey is a 69 year old female with sarcoidosis and a history of colonic polyps who presents for pre-colonoscopy evaluation.  Colorectal Cancer Surveillance: - History of colonic polyps - Last colonoscopy performed in April 2022 - Advised to repeat colonoscopy at five-year interval - No gastrointestinal symptoms: bowel movements regular, no hematochezia, no abdominal pain  Pulmonary Status and Sarcoidosis: - Sarcoidosis remains stable - Supplemental oxygen  used variably: two liters during the day, three liters with exertion, one and a half liters at night - Oxygen  not used when sitting or at rest - No change in oxygen  requirements - No chest pain  Cough: - Experienced bronchitis in fall of previous year with associated sputum production - Sputum production has resolved - Persistent cough since bronchitis  Previous GI Procedures/Imaging   Colonoscopy 05/21/2020 - Non-bleeding  external and internal hemorrhoids.  - No specimens collected. - Recall 5 years   Past Medical History:  Diagnosis Date   Cataract    right eye   Hypertension    Pneumothorax    Sarcoidosis    skin, eye, lung   Unspecified iridocyclitis     Past Surgical History:  Procedure Laterality Date   ABDOMINAL HYSTERECTOMY     APPENDECTOMY     BRONCHOSCOPY  2003   CATARACT EXTRACTION     COLONOSCOPY WITH PROPOFOL  N/A 05/21/2020   Procedure: COLONOSCOPY WITH PROPOFOL ;  Surgeon: Shila Gustav GAILS, MD;  Location: [facility];  Service: Endoscopy;  Laterality: N/A;   INTERCOSTAL NERVE BLOCK Right 03/12/2019   Procedure: Intercostal Nerve Block;  Surgeon: Army Dallas NOVAK, MD;  Location: [facility] OR;  Service: Thoracic;  Laterality: Right;   PLEURADESIS Right 08/23/2019   Procedure: MECHANICAL PLEURODESIS;  Surgeon: Army Dallas NOVAK, MD;  Location: [facility] OR;  Service: Thoracic;  Laterality: Right;   TALC  PLEURODESIS Right 08/23/2019   Procedure: TALC  PLEURADESIS;  Surgeon: Army Dallas NOVAK, MD;  Location: [facility] OR;  Service: Thoracic;  Laterality: Right;   VESICOVAGINAL FISTULA CLOSURE W/ TAH     VIDEO ASSISTED THORACOSCOPY Right 08/23/2019   Procedure: VIDEO ASSISTED THORACOSCOPY;  Surgeon: Army Dallas NOVAK, MD;  Location: [facility] OR;  Service: Thoracic;  Laterality: Right;   VIDEO BRONCHOSCOPY N/A 03/12/2019   Procedure: Video Bronchoscopy;  Surgeon: Army Dallas NOVAK, MD;  Location: [facility] OR;  Service: Thoracic;  Laterality: N/A;    Current Outpatient Medications  Medication Sig Dispense Refill   albuterol  (VENTOLIN  HFA) 108 (90 Base) MCG/ACT inhaler Inhale 2 puffs into the lungs every 6 (six) hours as  needed for wheezing or shortness of breath. 18 g 12   folic acid  (FOLVITE ) 1 MG tablet Take 5 mg by mouth at bedtime.      hydrochlorothiazide  (HYDRODIURIL ) 12.5 MG tablet Take 12.5 mg by mouth daily.     hydrochlorothiazide  (MICROZIDE ) 12.5 MG capsule TAKE 1 CAPSULE BY MOUTH EVERY DAY.** OVERDUE FOR  ANNUAL APPT MUST SEE PROVIDER FOR FUTURE REFILLS** 30 capsule 0   methotrexate  (RHEUMATREX) 2.5 MG tablet Take 7.5 mg by mouth every Wednesday. Caution:Chemotherapy. Protect from light. Takes 3 tabs a week     OXYGEN  Inhale into the lungs. 1.5 liters At night and during the day when she's active she uses 2-3 liters of oxygen      prednisoLONE  acetate (PRED FORTE ) 1 % ophthalmic suspension Place 1 drop into the right eye in the morning, at noon, and at bedtime.     cyclobenzaprine  (FLEXERIL ) 10 MG tablet Take 0.5-1 tablets (5-10 mg total) by mouth 2 (two) times daily as needed for muscle spasms. (Patient not taking: Reported on 02/14/2024) 20 tablet 0   traMADol  (ULTRAM ) 50 MG tablet Take 1 tablet (50 mg total) by mouth every 6 (six) hours as needed. (Patient not taking: Reported on 02/14/2024) 15 tablet 0   No current facility-administered medications for this visit.    Allergies as of 02/14/2024   (No Known Allergies)    Family History  Problem Relation Age of Onset   Alzheimer's disease Mother    Cancer Father    Heart disease Father    Prostate cancer Brother    Heart disease Brother    Heart disease Brother    Diabetes Other    Colon cancer Neg Hx    Esophageal cancer Neg Hx    Pancreatic cancer Neg Hx    Stomach cancer Neg Hx     Social History[1]   Review of Systems:    Constitutional: No weight loss, fever, chills Cardiovascular: No chest pain Respiratory: SOB at baseline Gastrointestinal: See HPI and otherwise negative   Physical Exam:  Vital signs: BP 136/78 (BP Location: Left Arm, Patient Position: Sitting, Cuff Size: Normal)   Pulse 72   Ht 5' 2 (1.575 m)   Wt 143 lb 4 oz (65 kg)   BMI 26.20 kg/m   Wt Readings from Last 3 Encounters:  02/14/24 143 lb 4 oz (65 kg)  12/12/23 138 lb 6.4 oz (62.8 kg)  07/05/23 136 lb (61.7 kg)     Constitutional: Pleasant, well-appearing female in NAD, alert and cooperative, on supplemental oxygen  per nasal cannula Head:   Normocephalic and atraumatic.  Respiratory: Respirations even and unlabored. Lungs clear to auscultation bilaterally.  No wheezes, crackles, or rhonchi.  Cardiovascular:  Regular rate and rhythm. No murmurs. No peripheral edema. Gastrointestinal:  Soft, nondistended, nontender. No rebound or guarding. Normal bowel sounds. No appreciable masses or hepatomegaly. Rectal:  Not performed.  Neurologic:  Alert and oriented x4;  grossly normal neurologically.  Skin:   Dry and intact without significant lesions or rashes. Psychiatric: Oriented to person, place and time. Demonstrates good judgement and reason without abnormal affect or behaviors.   Echocardiogram 08/09/2021 1. Global longitudinal strain is normal at - 24. 6% . Left ventricular ejection fraction, by estimation, is 60 to 65% . The left ventricle has normal function. The left ventricle has no regional wall motion abnormalities. Left ventricular diastolic parameters are indeterminate.  2. RV free wall strain is normal at - 28. 6 5. Right ventricular systolic function is normal.  The right ventricular size is normal. There is normal pulmonary artery systolic pressure.  3. The mitral valve is normal in structure. Mild mitral valve regurgitation. Moderate mitral annular calcification.  4. The aortic valve is tricuspid. Aortic valve regurgitation is not visualized. Aortic valve sclerosis is present, with no evidence of aortic valve stenosis.  5. Mildly dilated pulmonary artery.   Assessment/Plan:   Assessment & Plan Personal history of colonic polyps Asymptomatic with no gastrointestinal complaints.  Last colonoscopy 04/2020, with 5-year recall recommended.  In that case colonoscopy not due until 04/2025.  Looks like there has been some confusion regarding date for recall.  Will discuss this further with team, but at this time looks like patient is not due for repeat colonoscopy until next year.  - Plan for follow-up office visit in a year to  schedule hospital-based surveillance colonoscopy - Will discuss further with team regarding colonoscopy recall and change recall date in system if appropriate  Pulmonary sarcoidosis Respiratory failure with hypoxia Well-managed with supplemental oxygen . No acute complications identified.  - Future colonoscopies will need to be done in hospital setting    Camie Furbish, PA-C [facility] 02/14/2024, 2:48 PM  Patient Care Team: Delores Rojelio Caldron, NP as PCP - General (Nurse Practitioner) Kate Lonni CROME, MD as PCP - Cardiology (Cardiology)       [1]  Social History Tobacco Use   Smoking status: Never   Smokeless tobacco: Never  Vaping Use   Vaping status: Never Used  Substance Use Topics   Alcohol use: No   Drug use: No   "

## 2024-02-14 NOTE — Telephone Encounter (Signed)
 Hi, I just saw this patient to discuss surveillance colonoscopy but it looks to me like she is not due for recall yet.  Just wanted to confirm this with you all since there was a previous message 01/05/2024 about it, but as far as I can tell she had a colonoscopy 04/2020 with 5-year recall recommended. So unless I am missing something I do not think she is due for repeat until next year.  Either way, will need to be done at the hospital due to her chronic respiratory failure with hypoxia, she is on oxygen . Not having any GI concerns at this time.  Let me know if it's okay to have her return for recall next year.  Thank you,  SH

## 2024-02-14 NOTE — Patient Instructions (Signed)
 _______________________________________________________  If your blood pressure at your visit was 140/90 or greater, please contact your primary care physician to follow up on this.  _______________________________________________________  If you are age 70 or older, your body mass index should be between 23-30. Your Body mass index is 26.2 kg/m. If this is out of the aforementioned range listed, please consider follow up with your Primary Care Provider.  If you are age 46 or younger, your body mass index should be between 19-25. Your Body mass index is 26.2 kg/m. If this is out of the aformentioned range listed, please consider follow up with your Primary Care Provider.   ________________________________________________________  The [facility] providers would like to encourage you to use MYCHART to communicate with providers for non-urgent requests or questions.  Due to long hold times on the telephone, sending your provider a message by [facility] -  may be a faster and more efficient way to get a response.  Please allow 48 business hours for a response.  Please remember that this is for non-urgent requests.  _______________________________________________________  [facility] is using a team-based approach to care.  Your team is made up of your doctor and two to three APPS. Our APPS (Nurse Practitioners and Physician Assistants) work with your physician to ensure care continuity for you. They are fully qualified to address your health concerns and develop a treatment plan. They communicate directly with your gastroenterologist to care for you. Seeing the Advanced Practice Practitioners on your physician's team can help you by facilitating care more promptly, often allowing for earlier appointments, access to diagnostic testing, procedures, and other specialty referrals.

## 2024-02-15 ENCOUNTER — Encounter: Admitting: Gastroenterology

## 2024-02-15 NOTE — Telephone Encounter (Signed)
 Agree, thanks

## 2024-05-21 ENCOUNTER — Ambulatory Visit: Admitting: Internal Medicine
